# Patient Record
Sex: Male | Born: 1965 | Race: White | Hispanic: No | Marital: Married | Smoking: Never smoker
Health system: Southern US, Community
[De-identification: ages and names within clinical notes are randomized; demographics above are authoritative.]

## PROBLEM LIST (undated history)

## (undated) DIAGNOSIS — J189 Pneumonia, unspecified organism: Secondary | ICD-10-CM

## (undated) DIAGNOSIS — R1310 Dysphagia, unspecified: Secondary | ICD-10-CM

## (undated) DIAGNOSIS — I447 Left bundle-branch block, unspecified: Secondary | ICD-10-CM

## (undated) DIAGNOSIS — Z8619 Personal history of other infectious and parasitic diseases: Secondary | ICD-10-CM

## (undated) DIAGNOSIS — K219 Gastro-esophageal reflux disease without esophagitis: Secondary | ICD-10-CM

## (undated) DIAGNOSIS — G7111 Myotonic muscular dystrophy: Secondary | ICD-10-CM

## (undated) DIAGNOSIS — Z8701 Personal history of pneumonia (recurrent): Secondary | ICD-10-CM

## (undated) DIAGNOSIS — I429 Cardiomyopathy, unspecified: Secondary | ICD-10-CM

## (undated) DIAGNOSIS — J40 Bronchitis, not specified as acute or chronic: Secondary | ICD-10-CM

## (undated) DIAGNOSIS — G4730 Sleep apnea, unspecified: Secondary | ICD-10-CM

## (undated) HISTORY — DX: Gastro-esophageal reflux disease without esophagitis: K21.9

## (undated) HISTORY — DX: Left bundle-branch block, unspecified: I44.7

## (undated) HISTORY — DX: Myotonic muscular dystrophy: G71.11

## (undated) HISTORY — DX: Personal history of other infectious and parasitic diseases: Z86.19

## (undated) HISTORY — DX: Personal history of pneumonia (recurrent): Z87.01

## (undated) HISTORY — DX: Bronchitis, not specified as acute or chronic: J40

## (undated) HISTORY — PX: PACEMAKER IMPLANT: EP1218

## (undated) HISTORY — PX: CARDIAC DEFIBRILLATOR PLACEMENT: SHX171

## (undated) HISTORY — DX: Cardiomyopathy, unspecified: I42.9

---

## 2008-04-20 ENCOUNTER — Inpatient Hospital Stay: Payer: Self-pay | Admitting: *Deleted

## 2008-04-29 ENCOUNTER — Encounter: Payer: Self-pay | Admitting: Neurology

## 2010-06-27 ENCOUNTER — Inpatient Hospital Stay: Payer: Self-pay | Admitting: Internal Medicine

## 2010-09-26 HISTORY — PX: US ECHOCARDIOGRAPHY: HXRAD669

## 2010-11-03 ENCOUNTER — Encounter: Payer: Self-pay | Admitting: Cardiology

## 2010-11-27 ENCOUNTER — Encounter: Payer: Self-pay | Admitting: Cardiology

## 2010-12-24 DIAGNOSIS — J189 Pneumonia, unspecified organism: Secondary | ICD-10-CM | POA: Insufficient documentation

## 2010-12-27 ENCOUNTER — Encounter: Payer: Self-pay | Admitting: Cardiology

## 2011-03-29 DIAGNOSIS — Z8701 Personal history of pneumonia (recurrent): Secondary | ICD-10-CM

## 2011-03-29 HISTORY — DX: Personal history of pneumonia (recurrent): Z87.01

## 2012-12-07 ENCOUNTER — Emergency Department: Payer: Self-pay | Admitting: Emergency Medicine

## 2013-05-14 ENCOUNTER — Ambulatory Visit: Payer: Self-pay | Admitting: Family Medicine

## 2013-05-31 ENCOUNTER — Ambulatory Visit (INDEPENDENT_AMBULATORY_CARE_PROVIDER_SITE_OTHER): Payer: Medicare Other | Admitting: Family Medicine

## 2013-05-31 ENCOUNTER — Encounter: Payer: Self-pay | Admitting: Family Medicine

## 2013-05-31 VITALS — BP 112/76 | HR 76 | Temp 98.1°F | Ht 71.0 in | Wt 200.2 lb

## 2013-05-31 DIAGNOSIS — I429 Cardiomyopathy, unspecified: Secondary | ICD-10-CM | POA: Insufficient documentation

## 2013-05-31 DIAGNOSIS — G7111 Myotonic muscular dystrophy: Secondary | ICD-10-CM | POA: Insufficient documentation

## 2013-05-31 DIAGNOSIS — K219 Gastro-esophageal reflux disease without esophagitis: Secondary | ICD-10-CM | POA: Insufficient documentation

## 2013-05-31 DIAGNOSIS — E291 Testicular hypofunction: Secondary | ICD-10-CM

## 2013-05-31 DIAGNOSIS — N529 Male erectile dysfunction, unspecified: Secondary | ICD-10-CM

## 2013-05-31 DIAGNOSIS — I428 Other cardiomyopathies: Secondary | ICD-10-CM

## 2013-05-31 DIAGNOSIS — I447 Left bundle-branch block, unspecified: Secondary | ICD-10-CM | POA: Insufficient documentation

## 2013-05-31 NOTE — Progress Notes (Signed)
Pre visit review using our clinic review tool, if applicable. No additional management support is needed unless otherwise documented below in the visit note. 

## 2013-05-31 NOTE — Patient Instructions (Addendum)
Sign release for records from Dr. Debbora Dus at [place], and Dr. Ileene Patrick at [facility]. Good to meet you today, call us with questions. Return as needed or in 4-6 months for a wellness exam, prior fasting for blood work.

## 2013-05-31 NOTE — Progress Notes (Addendum)
BP 112/76  Pulse 76  Temp(Src) 98.1 F (36.7 C) (Oral)  Ht 5\' 11"  (1.803 m)  Wt 200 lb 4 oz (90.833 kg)  BMI 27.94 kg/m2  SpO2 98%   CC: new pt to establish  Subjective:    Patient ID: James [name], male    DOB: 01/27/1966, 48 y.o.   MRN: [id number]  HPI: James [name] is a 48 y.o. male presenting on 05/31/2013 with Establish Care   Prior saw Dr. Osker Mason at [facility].  Myotonic muscular dystrophy - dx 2010 at age 33yo.  Sees Neurology Dr. Paulla Dolly at [facility], now sees neuroscience center Dr. Ileene Patrick at [facility].  Last seen January 2014.  Sees Dr. Rich Fuchs at [place] cardiologist.  Brings EKG showing sinus bradycardia with 1st degree AV block, LBBB and LAD.  Brings echo dated 09/2010 showing mild LV dysfuction with mild LVH, mild RV systolic dysfunction.  Some erectile dysfunction - wonders about viagra trial.  Has been told has low testosterone.  Main trouble maintaining erection.  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  Allergies and medications reviewed and updated. No current outpatient prescriptions on file prior to visit.   No current facility-administered medications on file prior to visit.    Review of Systems Per HPI unless specifically indicated above    Objective:    BP 112/76  Pulse 76  Temp(Src) 98.1 F (36.7 C) (Oral)  Ht 5\' 11"  (1.803 m)  Wt 200 lb 4 oz (90.833 kg)  BMI 27.94 kg/m2  SpO2 98%  Physical Exam  Nursing note and vitals reviewed. Constitutional: He is oriented to person, place, and time. He appears well-developed and well-nourished. No distress.  HENT:  Head: Normocephalic and atraumatic.  Nose: Nose  normal.  Mouth/Throat: Uvula is midline, oropharynx is clear and moist and mucous membranes are normal. No oropharyngeal exudate, posterior oropharyngeal edema or posterior oropharyngeal erythema.  Eyes: Conjunctivae and EOM are normal. Pupils are equal, round, and reactive to light. No scleral icterus.  Neck: Normal range of motion. Neck supple.  Cardiovascular: Normal rate, regular rhythm, normal heart sounds and intact distal pulses.   No murmur heard. Pulses:      Radial pulses are 2+ on the right side, and 2+ on the left side.  Pulmonary/Chest: Effort normal and breath sounds normal. No respiratory distress. He has no wheezes. He has no rales.  Musculoskeletal: Normal range of motion. He exhibits no edema.  Lymphadenopathy:    He has no cervical adenopathy.  Neurological: He is alert and oriented to person, place, and time.  5/5 strength BLE.  5/5 strength proximal UE, decreased grip strength noted. Decreased facial tone  Skin: Skin is warm and dry. No rash noted.  Psychiatric: He has a normal mood and affect. His behavior is normal. Judgment and thought content normal.   No results found for this or any previous visit.    Assessment & Plan:   Problem List Items Addressed This Visit   Cardiomyopathy     I have scanned latest EKG and echo into chart.  Pt is followed by [facility].    Erectile dysfunction     Await records from cards then consider PDE5 inhibitor trial.  GERD (gastroesophageal reflux disease)     Chronic, stable with prn antihistamine    Relevant Medications      docusate sodium (COLACE) 50 MG capsule      ranitidine (ZANTAC) 150 MG capsule   Hypogonadism male     Possibly myotonic dystrophy related.  Will await records from prior PCP and consider checking testosterone.    LBBB (left bundle branch block)   Myotonic dystrophy, type 1 - Primary     I have requested records from neurologist at [facility]. Seems relatively stable - will need to monitor for  cardiac conduction abnormalities, cataracts, testicular failure, hypogammaglobulinemia, and insulin resistance as well as foot drop and sleep disorders. Pt on disability for muscular dystrophy.        Follow up plan: Return in about 6 months (around 12/01/2013), or as needed, for wellness exam..

## 2013-06-01 ENCOUNTER — Encounter: Payer: Self-pay | Admitting: Family Medicine

## 2013-06-01 DIAGNOSIS — N529 Male erectile dysfunction, unspecified: Secondary | ICD-10-CM | POA: Insufficient documentation

## 2013-06-01 DIAGNOSIS — E291 Testicular hypofunction: Secondary | ICD-10-CM | POA: Insufficient documentation

## 2013-06-01 NOTE — Assessment & Plan Note (Signed)
Possibly myotonic dystrophy related.  Will await records from prior PCP and consider checking testosterone.

## 2013-06-01 NOTE — Assessment & Plan Note (Signed)
I have scanned latest EKG and echo into chart.  Pt is followed by [facility].

## 2013-06-01 NOTE — Assessment & Plan Note (Signed)
Await records from cards then consider PDE5 inhibitor trial.

## 2013-06-01 NOTE — Assessment & Plan Note (Signed)
Chronic, stable with prn antihistamine

## 2013-06-01 NOTE — Assessment & Plan Note (Addendum)
I have requested records from neurologist at [facility]. Seems relatively stable - will need to monitor for cardiac conduction abnormalities, cataracts, testicular failure, hypogammaglobulinemia, and insulin resistance as well as foot drop and sleep disorders. Pt on disability for muscular dystrophy.

## 2013-06-22 ENCOUNTER — Encounter: Payer: Self-pay | Admitting: Family Medicine

## 2013-08-02 DIAGNOSIS — E291 Testicular hypofunction: Secondary | ICD-10-CM | POA: Insufficient documentation

## 2013-08-08 DIAGNOSIS — N500 Atrophy of testis: Secondary | ICD-10-CM | POA: Insufficient documentation

## 2013-10-01 ENCOUNTER — Emergency Department: Payer: Self-pay | Admitting: Emergency Medicine

## 2013-10-01 LAB — COMPREHENSIVE METABOLIC PANEL
ALBUMIN: 3.6 g/dL (ref 3.4–5.0)
AST: 29 U/L (ref 15–37)
Alkaline Phosphatase: 73 U/L
Anion Gap: 5 — ABNORMAL LOW (ref 7–16)
BILIRUBIN TOTAL: 1.6 mg/dL — AB (ref 0.2–1.0)
BUN: 10 mg/dL (ref 7–18)
CALCIUM: 8.9 mg/dL (ref 8.5–10.1)
CREATININE: 0.9 mg/dL (ref 0.60–1.30)
Chloride: 108 mmol/L — ABNORMAL HIGH (ref 98–107)
Co2: 29 mmol/L (ref 21–32)
EGFR (African American): >60
GLUCOSE: 100 mg/dL — AB (ref 65–99)
Osmolality: 282 (ref 275–301)
Potassium: 3.7 mmol/L (ref 3.5–5.1)
SGPT (ALT): 57 U/L (ref 12–78)
Sodium: 142 mmol/L (ref 136–145)
Total Protein: 7.3 g/dL (ref 6.4–8.2)

## 2013-10-01 LAB — CBC
HCT: 48.5 % (ref 40.0–52.0)
HGB: 15.8 g/dL (ref 13.0–18.0)
MCH: 30.8 pg (ref 26.0–34.0)
MCHC: 32.6 g/dL (ref 32.0–36.0)
MCV: 94 fL (ref 80–100)
PLATELETS: 181 10*3/uL (ref 150–440)
RBC: 5.14 10*6/uL (ref 4.40–5.90)
RDW: 14.6 % — AB (ref 11.5–14.5)
WBC: 7.7 10*3/uL (ref 3.8–10.6)

## 2013-10-01 LAB — URINALYSIS, COMPLETE
BACTERIA: NONE SEEN
Bilirubin,UR: NEGATIVE
Blood: NEGATIVE
GLUCOSE, UR: NEGATIVE mg/dL (ref 0–75)
Ketone: NEGATIVE
LEUKOCYTE ESTERASE: NEGATIVE
Nitrite: NEGATIVE
PROTEIN: NEGATIVE
Ph: 7 (ref 4.5–8.0)
RBC, UR: NONE SEEN /HPF (ref 0–5)
SPECIFIC GRAVITY: 1.003 (ref 1.003–1.030)
Squamous Epithelial: NONE SEEN
WBC UR: 1 /HPF (ref 0–5)

## 2013-10-01 LAB — TROPONIN I: Troponin-I: <0.02 ng/mL

## 2013-10-06 ENCOUNTER — Emergency Department: Payer: Self-pay | Admitting: Emergency Medicine

## 2013-10-06 LAB — BASIC METABOLIC PANEL
ANION GAP: 5 — AB (ref 7–16)
BUN: 7 mg/dL (ref 7–18)
CALCIUM: 8.8 mg/dL (ref 8.5–10.1)
CHLORIDE: 108 mmol/L — AB (ref 98–107)
Co2: 31 mmol/L (ref 21–32)
Creatinine: 1.15 mg/dL (ref 0.60–1.30)
Glucose: 107 mg/dL — ABNORMAL HIGH (ref 65–99)
Osmolality: 285 (ref 275–301)
Potassium: 4.4 mmol/L (ref 3.5–5.1)
Sodium: 144 mmol/L (ref 136–145)

## 2013-10-06 LAB — CBC
HCT: 45.2 % (ref 40.0–52.0)
HGB: 15 g/dL (ref 13.0–18.0)
MCH: 31.6 pg (ref 26.0–34.0)
MCHC: 33.1 g/dL (ref 32.0–36.0)
MCV: 95 fL (ref 80–100)
PLATELETS: 150 10*3/uL (ref 150–440)
RBC: 4.74 10*6/uL (ref 4.40–5.90)
RDW: 14.5 % (ref 11.5–14.5)
WBC: 5.5 10*3/uL (ref 3.8–10.6)

## 2013-10-06 LAB — PRO B NATRIURETIC PEPTIDE: B-Type Natriuretic Peptide: 48 pg/mL (ref 0–125)

## 2013-10-06 LAB — TROPONIN I: Troponin-I: <0.02 ng/mL

## 2013-10-07 ENCOUNTER — Inpatient Hospital Stay: Payer: Self-pay | Admitting: Internal Medicine

## 2013-10-07 LAB — COMPREHENSIVE METABOLIC PANEL
ALBUMIN: 3.3 g/dL — AB (ref 3.4–5.0)
ALT: 46 U/L (ref 12–78)
AST: 52 U/L — AB (ref 15–37)
Alkaline Phosphatase: 77 U/L
Anion Gap: 4 — ABNORMAL LOW (ref 7–16)
BUN: 7 mg/dL (ref 7–18)
Bilirubin,Total: 1.3 mg/dL — ABNORMAL HIGH (ref 0.2–1.0)
CALCIUM: 8.4 mg/dL — AB (ref 8.5–10.1)
CREATININE: 0.99 mg/dL (ref 0.60–1.30)
Chloride: 110 mmol/L — ABNORMAL HIGH (ref 98–107)
Co2: 28 mmol/L (ref 21–32)
EGFR (Non-African Amer.): >60
GLUCOSE: 96 mg/dL (ref 65–99)
Osmolality: 281 (ref 275–301)
POTASSIUM: 4.2 mmol/L (ref 3.5–5.1)
SODIUM: 142 mmol/L (ref 136–145)
TOTAL PROTEIN: 7.5 g/dL (ref 6.4–8.2)

## 2013-10-07 LAB — CBC WITH DIFFERENTIAL/PLATELET
Basophil #: 0 10*3/uL (ref 0.0–0.1)
Basophil %: 0.5 %
EOS ABS: 0.2 10*3/uL (ref 0.0–0.7)
Eosinophil %: 2.7 %
HCT: 45.5 % (ref 40.0–52.0)
HGB: 15.3 g/dL (ref 13.0–18.0)
LYMPHS ABS: 1.2 10*3/uL (ref 1.0–3.6)
Lymphocyte %: 17.1 %
MCH: 32.1 pg (ref 26.0–34.0)
MCHC: 33.5 g/dL (ref 32.0–36.0)
MCV: 96 fL (ref 80–100)
MONO ABS: 0.7 x10 3/mm (ref 0.2–1.0)
Monocyte %: 9.9 %
NEUTROS ABS: 4.7 10*3/uL (ref 1.4–6.5)
Neutrophil %: 69.8 %
PLATELETS: 154 10*3/uL (ref 150–440)
RBC: 4.75 10*6/uL (ref 4.40–5.90)
RDW: 14.3 % (ref 11.5–14.5)
WBC: 6.7 10*3/uL (ref 3.8–10.6)

## 2013-10-07 LAB — TROPONIN I
Troponin-I: <0.02 ng/mL
Troponin-I: <0.02 ng/mL

## 2013-10-08 LAB — SODIUM: SODIUM: 144 mmol/L (ref 136–145)

## 2013-10-08 LAB — CBC WITH DIFFERENTIAL/PLATELET
BASOS ABS: 0 10*3/uL (ref 0.0–0.1)
Basophil %: 0.6 %
Eosinophil #: 0.2 10*3/uL (ref 0.0–0.7)
Eosinophil %: 4.4 %
HCT: 41.1 % (ref 40.0–52.0)
HGB: 13.9 g/dL (ref 13.0–18.0)
Lymphocyte #: 1.4 10*3/uL (ref 1.0–3.6)
Lymphocyte %: 25.4 %
MCH: 32.1 pg (ref 26.0–34.0)
MCHC: 33.9 g/dL (ref 32.0–36.0)
MCV: 95 fL (ref 80–100)
MONO ABS: 0.5 x10 3/mm (ref 0.2–1.0)
Monocyte %: 8.7 %
NEUTROS ABS: 3.4 10*3/uL (ref 1.4–6.5)
Neutrophil %: 60.9 %
PLATELETS: 136 10*3/uL — AB (ref 150–440)
RBC: 4.33 10*6/uL — ABNORMAL LOW (ref 4.40–5.90)
RDW: 14.1 % (ref 11.5–14.5)
WBC: 5.6 10*3/uL (ref 3.8–10.6)

## 2013-10-08 LAB — BASIC METABOLIC PANEL
Anion Gap: 5 — ABNORMAL LOW (ref 7–16)
BUN: 7 mg/dL (ref 7–18)
CHLORIDE: 111 mmol/L — AB (ref 98–107)
Calcium, Total: 8.3 mg/dL — ABNORMAL LOW (ref 8.5–10.1)
Co2: 30 mmol/L (ref 21–32)
Creatinine: 1.09 mg/dL (ref 0.60–1.30)
EGFR (African American): >60
EGFR (Non-African Amer.): >60
Glucose: 93 mg/dL (ref 65–99)
OSMOLALITY: 288 (ref 275–301)
POTASSIUM: 3.4 mmol/L — AB (ref 3.5–5.1)
Sodium: 146 mmol/L — ABNORMAL HIGH (ref 136–145)

## 2013-10-09 LAB — BASIC METABOLIC PANEL
ANION GAP: 5 — AB (ref 7–16)
BUN: 7 mg/dL (ref 7–18)
CHLORIDE: 110 mmol/L — AB (ref 98–107)
CO2: 30 mmol/L (ref 21–32)
CREATININE: 1.06 mg/dL (ref 0.60–1.30)
Calcium, Total: 8.1 mg/dL — ABNORMAL LOW (ref 8.5–10.1)
EGFR (African American): >60
EGFR (Non-African Amer.): >60
Glucose: 120 mg/dL — ABNORMAL HIGH (ref 65–99)
Osmolality: 288 (ref 275–301)
Potassium: 3.8 mmol/L (ref 3.5–5.1)
SODIUM: 145 mmol/L (ref 136–145)

## 2013-10-09 LAB — MAGNESIUM: MAGNESIUM: 2.2 mg/dL

## 2013-10-12 LAB — CULTURE, BLOOD (SINGLE)

## 2013-10-13 LAB — EXPECTORATED SPUTUM ASSESSMENT W REFEX TO RESP CULTURE

## 2013-11-05 ENCOUNTER — Encounter: Payer: Medicare Other | Admitting: Family Medicine

## 2014-01-14 ENCOUNTER — Encounter: Payer: Self-pay | Admitting: Family Medicine

## 2014-01-23 ENCOUNTER — Emergency Department: Payer: Self-pay | Admitting: Emergency Medicine

## 2014-01-23 LAB — URINALYSIS, COMPLETE
BACTERIA: NONE SEEN
Bilirubin,UR: NEGATIVE
Blood: NEGATIVE
Glucose,UR: NEGATIVE mg/dL (ref 0–75)
KETONE: NEGATIVE
Leukocyte Esterase: NEGATIVE
Nitrite: NEGATIVE
Ph: 7 (ref 4.5–8.0)
Protein: NEGATIVE
RBC, UR: NONE SEEN /HPF (ref 0–5)
SPECIFIC GRAVITY: 1.006 (ref 1.003–1.030)
SQUAMOUS EPITHELIAL: NONE SEEN

## 2014-01-23 LAB — CBC
HCT: 40.5 % (ref 40.0–52.0)
HGB: 13.4 g/dL (ref 13.0–18.0)
MCH: 32 pg (ref 26.0–34.0)
MCHC: 33.2 g/dL (ref 32.0–36.0)
MCV: 97 fL (ref 80–100)
Platelet: 140 10*3/uL — ABNORMAL LOW (ref 150–440)
RBC: 4.19 10*6/uL — AB (ref 4.40–5.90)
RDW: 14.4 % (ref 11.5–14.5)
WBC: 4.9 10*3/uL (ref 3.8–10.6)

## 2014-01-23 LAB — CK TOTAL AND CKMB (NOT AT ARMC)
CK, TOTAL: 160 U/L
CK-MB: 2.1 ng/mL (ref 0.5–3.6)

## 2014-01-23 LAB — COMPREHENSIVE METABOLIC PANEL
ALK PHOS: 81 U/L
ALT: 63 U/L
ANION GAP: 3 — AB (ref 7–16)
Albumin: 2.9 g/dL — ABNORMAL LOW (ref 3.4–5.0)
BILIRUBIN TOTAL: 0.6 mg/dL (ref 0.2–1.0)
BUN: 7 mg/dL (ref 7–18)
CALCIUM: 7.7 mg/dL — AB (ref 8.5–10.1)
CHLORIDE: 111 mmol/L — AB (ref 98–107)
Co2: 31 mmol/L (ref 21–32)
Creatinine: 0.88 mg/dL (ref 0.60–1.30)
GLUCOSE: 99 mg/dL (ref 65–99)
OSMOLALITY: 287 (ref 275–301)
Potassium: 3.4 mmol/L — ABNORMAL LOW (ref 3.5–5.1)
SGOT(AST): 36 U/L (ref 15–37)
Sodium: 145 mmol/L (ref 136–145)
TOTAL PROTEIN: 6.1 g/dL — AB (ref 6.4–8.2)

## 2014-01-23 LAB — TROPONIN I: Troponin-I: <0.02 ng/mL

## 2014-04-15 ENCOUNTER — Emergency Department: Payer: Self-pay | Admitting: Emergency Medicine

## 2014-04-15 DIAGNOSIS — J9811 Atelectasis: Secondary | ICD-10-CM | POA: Diagnosis not present

## 2014-04-15 DIAGNOSIS — R197 Diarrhea, unspecified: Secondary | ICD-10-CM | POA: Diagnosis not present

## 2014-04-15 DIAGNOSIS — R112 Nausea with vomiting, unspecified: Secondary | ICD-10-CM | POA: Diagnosis not present

## 2014-04-15 DIAGNOSIS — R1110 Vomiting, unspecified: Secondary | ICD-10-CM | POA: Diagnosis not present

## 2014-04-15 LAB — COMPREHENSIVE METABOLIC PANEL
ALK PHOS: 87 U/L
ALT: 52 U/L
Albumin: 3.7 g/dL (ref 3.4–5.0)
Anion Gap: 7 (ref 7–16)
BILIRUBIN TOTAL: 2.7 mg/dL — AB (ref 0.2–1.0)
BUN: 11 mg/dL (ref 7–18)
CHLORIDE: 104 mmol/L (ref 98–107)
CO2: 30 mmol/L (ref 21–32)
CREATININE: 1.22 mg/dL (ref 0.60–1.30)
Calcium, Total: 8.9 mg/dL (ref 8.5–10.1)
EGFR (African American): >60
EGFR (Non-African Amer.): >60
Glucose: 128 mg/dL — ABNORMAL HIGH (ref 65–99)
OSMOLALITY: 282 (ref 275–301)
Potassium: 4.1 mmol/L (ref 3.5–5.1)
SGOT(AST): 23 U/L (ref 15–37)
Sodium: 141 mmol/L (ref 136–145)
Total Protein: 7.7 g/dL (ref 6.4–8.2)

## 2014-04-15 LAB — CBC
HCT: 52.2 % — ABNORMAL HIGH (ref 40.0–52.0)
HGB: 17.5 g/dL (ref 13.0–18.0)
MCH: 31.9 pg (ref 26.0–34.0)
MCHC: 33.6 g/dL (ref 32.0–36.0)
MCV: 95 fL (ref 80–100)
PLATELETS: 194 10*3/uL (ref 150–440)
RBC: 5.5 10*6/uL (ref 4.40–5.90)
RDW: 14.4 % (ref 11.5–14.5)
WBC: 6.7 10*3/uL (ref 3.8–10.6)

## 2014-04-15 LAB — LIPASE, BLOOD: Lipase: 113 U/L (ref 73–393)

## 2014-04-28 DIAGNOSIS — I429 Cardiomyopathy, unspecified: Secondary | ICD-10-CM | POA: Diagnosis not present

## 2014-04-28 DIAGNOSIS — I447 Left bundle-branch block, unspecified: Secondary | ICD-10-CM | POA: Diagnosis not present

## 2014-04-28 DIAGNOSIS — G7111 Myotonic muscular dystrophy: Secondary | ICD-10-CM | POA: Diagnosis not present

## 2014-05-08 DIAGNOSIS — I429 Cardiomyopathy, unspecified: Secondary | ICD-10-CM | POA: Diagnosis not present

## 2014-05-08 DIAGNOSIS — G7111 Myotonic muscular dystrophy: Secondary | ICD-10-CM | POA: Diagnosis not present

## 2014-05-08 DIAGNOSIS — I447 Left bundle-branch block, unspecified: Secondary | ICD-10-CM | POA: Diagnosis not present

## 2014-05-16 DIAGNOSIS — Z0000 Encounter for general adult medical examination without abnormal findings: Secondary | ICD-10-CM | POA: Diagnosis not present

## 2014-07-19 NOTE — Consult Note (Signed)
PATIENT NAME:  James [name], James [name] MR#:  [id number] DATE OF BIRTH:  12-04-1965  DATE OF CONSULTATION:  10/10/2013  REFERRING PHYSICIAN:  Theodoro Grist, MD CONSULTING PHYSICIAN:  Herbon E. Raul Del, MD  REASON FOR CONSULTATION: Shortness of breath, pneumonia.   HISTORY OF PRESENT ILLNESS: James [name] is a 49 year old gentleman who has muscular dystrophy involving primarily his shoulders and upper chest. He comes in here on 07/13 with a cough, somewhat productive, but no associated blood. On work-up his initial chest x-ray showed patchy left basilar airspace disease suggestive of pneumonia. His white count on admission was 5.5. Because of shortness of breath and vague chest pain he also had a chest CT scan that showed no demonstrable pulmonary embolism. There was airspace consolidation in the left lower lobe and multifocal ground-glass type opacity also was seen. Hence, raising the possibility of pneumonia. There was no adenopathy or pleural effusion or aneurysm noted. The gallbladder was contracted and there were gallstones noted. The patient was empirically started on antibiotics. He is presently on azithromycin 500 mg daily. He is also on Rocephin 1 gram IV daily. Repeat chest x-ray 24 hours later showed low volumes as well as continued airspace opacity. At the time that I saw him he was having a cough, said he was feeling better. His father said he was much better since he had been in the hospital. He was not having any fevers. On 2 liters his sats are 96%. He was placed on room air and his subsequent sats have remained above 92%. There is no significant chest pain. No calf pain. No progressive leg edema. He has not been in any contact with anyone with TB or any active pulmonary infection. He has not had any recent travel. No history of aspiration. He did mention though he had been to the Emergency Room at least twice. He was discharged recently on Levaquin. Lastly, he does not seem as if he is well enough to  go home as yet. He did mention he was having problems mobilizing his secretions at times.   PAST MEDICAL HISTORY:  1.  Multiple pneumonias in the past.   2.  Muscular dystrophy.   PAST SURGICAL HISTORY: None.   ALLERGIES: Intolerance to CODEINE, causes nausea and vomiting.   SOCIAL HISTORY: He does not drink or smoke.   FAMILY HISTORY: Notable for hypertension.  REVIEW OF SYSTEMS: As per above. No headache, dizziness, passing out spells, choking spells. No sore throat, change in voice. No decrease in hearing. Does wear glasses. Chest as per above. No wheezing. Does have cough, minimal sputum production. No hemoptysis. No pleurisy nor is he having any orthopnea or paroxysmal nocturnal dyspnea. No nausea, vomiting, diarrhea, blood in stool, dysuria, flank pain, rashes, lymph nodes, TIA, seizures, passing out spells, or suicidal ideation today.   PHYSICAL EXAMINATION: VITAL SIGNS: Temperature 98.4, pulse 82, respiratory 20, blood pressure 121/76, and sats 91% on room air.  GENERAL: Large framed gentleman sitting in the chair with somewhat flat affect, glasses on, coughed occasionally while here, father at his side.  HEENT: Normocephalic, atraumatic. Extraocular muscles are intact. No thrush. NECK: Somewhat short and thick. No JVD. No thyromegaly.  LUNGS: Bilateral breath sounds with crackles at the bases, more so on the left.  HEART: Regular rate and rhythm. No obvious murmur or gallops. No peripheral signs of spontaneous bacterial endocarditis.  ABDOMEN: Soft, nontender, somewhat obese.  EXTREMITIES: No significant edema, cyanosis, or Homans sign.  SKIN: Fair turgor. No rashes. No unhealing  ulcers, telangiectasia, sclerodactyly, or bruising.  LYMPH: No nodes in neck or supraclavicular area.  NEUROLOGIC: Did not ambulate, but able to raise extremities against gravity.  PSYCH: Somewhat flat affect but appropriate, reasonable insight.  DIAGNOSTIC DATA: ABG on 07/15 showed a pH of 7.31, pCO2  51, and pO2 65 on FiO2 of 28. Lactic acid 1.2, which was normal.   Two-D echocardiogram on 10/10/2013 showed an ejection fraction of 70% to 75%. No significant right ventricular systolic pressure. No significant valvular disease.   Glucose 120, BUN 7, creatinine 1.06, sodium 145, potassium 3.8, chloride 110. Magnesium 2.2. WBC 5.6, hemoglobin 13.9, hematocrit 41.1, platelets 136,000, down from 150,000 on admission.    IMPRESSION: This is a pleasant 49 year old gentleman with muscular dystrophy having reasonably good cough. Radiographically and from his history it sounds like he probably does have left lower lung pneumonia on appropriate antibiotic coverage. Hence, my vote would be to continue coverage as he is taking. On discharge can probably discharge him on Ceftin 500 mg b.i.d. as well as azithromycin 500 mg daily, for a total of 10 days. The azithromycin we probably can stop after 7 days. I would also recommend that he get Mucinex and flutter valve, and if he is having problems with mobilizing secretions now as well as in the future he may probably benefit by being discharged on a vibratory vest, which we can get through [facility]. If possible sputum culture. Outpatient follow-up. We will reassess in the morning. Thank you for the referral. Yours truly.  ____________________________ James Munro Raul Del, MD hef:sb D: 10/11/2013 10:37:57 ET T: 10/11/2013 11:30:32 ET JOB#: [id number]  cc: Herbon E. Raul Del, MD, <Dictator> Erby Pian MD ELECTRONICALLY SIGNED 10/15/2013 16:50

## 2014-07-19 NOTE — Discharge Summary (Signed)
PATIENT NAME:  James [name], James [name] A MR#:  [id number] DATE OF BIRTH:  06/21/1965  DATE OF ADMISSION:  10/07/2013 DATE OF DISCHARGE:  10/11/2013  ADMITTING DIAGNOSIS: Pneumonia.   DISCHARGE DIAGNOSES:  1. Pneumonia.  2. Chest pain. 3. Cough. 4. Generalized weakness.  5. Hyponatremia with dehydration.  6. Hypokalemia. 7. History of muscular dystrophy.   DISCHARGE CONDITION: Stable.   DISCHARGE MEDICATIONS: The patient is to resume omeprazole 10 mg p.o. daily at bedtime, Naprosyn 250 mg p.o. twice daily, albuterol/ipratropium 6 times daily as needed via nebulizer, guaifenesin 600 mg p.o. twice daily, erythromycin 500 mg p.o. once daily for 1 more day, Tussionex 5 ml twice daily as needed. The patient is not to take amoxicillin or doxycycline. The patient is going to be discharged on Ceftin 500 mg p.o. twice daily as well as Zithromax 500 mg p.o. daily dose for a total of 10 days, so we are going to add 7 days of therapy which will be additional to whatever he received in the hospital..   HOME OXYGEN: None.   DIET: Regular, mechanical soft.   ACTIVITY LIMITATIONS: As tolerated. Referral to home health physical therapy was recommended.  Followup with Dr. Randel Books in 2 days after discharge. The patient will be discharged with nebulizer machine and pressure valve as well as vibration vest to be arranged for him upon discharge home.   CONSULTANTS:  1. Care management.  2. Social work.  3. Dr. Raul Del.   RADIOLOGIC STUDIES:  1. Chest x-ray, PA and lateral, continued patchy left basilar air-space disease, which could be due to atelectasis or pneumonia.  2. CT scan of chest with IV contrast to rule out pulmonary embolism as well as to evaluate lung disease showed no demonstrable pulmonary embolus. Air-space consolidation in the left lower lobe was noted. Multiple foci of ground-glass type opacities probably also representing infectious pneumonia, although this appearance may also be seen as a  consequence of allergic type phenomenon or atypical pulmonary edema. No adenopathy. There is a fatty liver according to radiology. 3. Repeated chest x-ray, portable, single view, on 10/07/2013, revealed low lung volumes, continued air-space opacities in the lung bases with associated volume loss potentially from atelectasis and/or pneumonia.   The patient is a 49 year old male with past medical history significant for history of muscular dystrophy, who presents to the hospital with complaints of chest pains, shortness of breath as well as cough. Please refer to Dr. Ebbie Ridge admission note on 10/09/2013. On arrival to the hospital, the patient's vital signs revealed a temperature of 98.8, pulse was 80, respiration rate was 18, blood pressure 102/66, saturation 95% on room air. Physical exam revealed crackles in all lung fields. The patient's lab data done on arrival to the hospital: BMP was unremarkable except for a glucose level of 107. Patient's liver enzymes done on 10/07/2013, revealed elevation of total bilirubin of 1.3, AST of 52. Cardiac enzymes done on 07/12/015 as well as 10/07/2013, were normal. CBC: White blood cell count was normal at 6.7 on 10/07/2013, hemoglobin was 15.3, platelet count 154,000. Absolute neutrophil count was normal. Blood cultures taken on 10/07/2013, did not show any growth. EKG revealed sinus rhythm at 79 beats per minute, 1st degree AV block, left axis deviation, left bundle-branch block. The patient was admitted to the hospital for further evaluation. He was started on Rocephin and Zithromax, and legionella urine antigen was taken, which was negative. Consultation with Dr. Raul Del was obtained. Dr. Raul Del saw the patient in consultation on 10/10/2013. Unfortunately, Dr.  Fleming's consult dictation was not available at the time of dictation; however, it appeared that Dr. Raul Del recommended to possibly discharge the patient home on Ceftin 500 mg p.o. twice daily, as well as  azithromycin 500 mg daily for a total of 10 days. The medicine, however, could be stopped after 7 days. He also recommended to get Mucinex as well as pressure valve, as he was having problems with mobilizing secretions, and according to Dr. Raul Del, radiology, he may benefit from discharge on vibratory vest. Recommended also outpatient follow up. The patient developedsome productive couph on current medications, IV Rocephin as well as Zithromax and it was felt that should be discharged on those medications. His sputum cultures we tried to obtain; however unfortunately, it was the unsuccessful and sputum cultures finally obtained on 10/10/2013, but they are still pending at the time of dictation. A few Gram-positive rods were noted, as well as many Gram-positive cocci in pairs.   On the day of discharge, the patient was comfortable,  not complaining of any significant discomfort. He is being discharged home with the above-mentioned medications and followup. On the day of discharge, the patient's temperature is 98.5, pulse was 79, respirations were 20, blood pressure 113/66, satura 94% to 98% on room air at rest.   TIME SPENT: 40 minutes on dictation.     ____________________________ Theodoro Grist, MD rv:lm D: 10/11/2013 20:39:41 ET T: 10/12/2013 05:03:47 ET JOB#: [id number]  cc: Theodoro Grist, MD, <Dictator> Milinda Pointer. Jacqualine Code, MD  Theodoro Grist MD ELECTRONICALLY SIGNED 10/26/2013 8:02

## 2014-07-19 NOTE — H&P (Signed)
PATIENT NAME:  James [name], James [name] MR#:  [id number] DATE OF BIRTH:  05/12/1965  DATE OF ADMISSION:  10/07/2013  PRIMARY CARE PHYSICIAN: Dr. Debbora Dus.   CHIEF COMPLAINT:  Cough, persistent pneumonia.   REASON FOR ADMISSION:  This is a 49 year old male with a history of muscular dystrophy. He was seen in our ER about 1 week ago. At that time he was complaining of productive cough, chest pain, fever and chills.  He had a chest x-ray, which was consistent with pneumonia.  He was sent home on Levaquin.  He finished his course of antibiotics, but still had persistent cough, more productive than previously with yellow sputum, low-grade fever so he came to the ER yesterday and was sent home with some antibiotics and then came back again today for nausea and vomiting, cough, fever and weakness. He denies any dyspnea on exertion, PND, orthopnea, lower extremity edema.  In the ER yesterday he had a CT which was consistent with left lower lobe pneumonia.   REVIEW OF SYSTEMS:  CONSTITUTIONAL: Positive fatigue, fever, weakness.  EYES:  No blurred, double vision or glaucoma.   ENT:  No ear pain, hearing loss, snoring, postnasal drip.  RESPIRATORY: Positive cough. No wheezing, hemoptysis. Positive dyspnea.  CARDIOVASCULAR:  Positive chest pain associated with his cough. No orthopnea, edema, arrhythmia, dyspnea on exertion, palpitations, syncope.  GASTROINTESTINAL: Positive nausea and vomiting. No diarrhea, abdominal pain, melena, or ulcers, abdominal pain.  GENITOURINARY:  No dysuria, hematuria.  ENDOCRINE: No polyuria or polydipsia.  HEME/LYMPH:  No anemia.  MUSCULOSKELETAL: He had muscular dystrophy.  No cramps or gout. SKIN: Without rashes or lesions. NEUROLOGIC:  No CVA, TIA, seizures. PSYCHIATRIC:  No history of anxiety, depression.  PAST MEDICAL HISTORY:   Muscular dystrophy.  MEDICATIONS: None.   ALLERGIES: CODEINE CAUSES NAUSEA, VOMITING.   SOCIAL HISTORY: No tobacco, alcohol or drug use.    FAMILY HISTORY: Positive for hypertension.   PAST SURGICAL HISTORY: None.   PHYSICAL EXAMINATION:  Temperature 98.8, pulse is 80, respirations 18, blood pressure 102/66, 95% on room air.  GENERAL: The patient is alert, oriented, not in acute distress.  HEENT: Head is atraumatic. Pupils are round and reactive.  Sclerae anicteric.  Mucous membranes are moist. Oropharynx is clear.  NECK: Supple without JVD, carotid bruit, enlarged thyroid.   CARDIOVASCULAR: Regular rate and rhythm. No murmurs, gallops, rubs. PMI is not displaced. LUNGS: He has crackles at the left base without any rhonchi, or wheezing. Normal percussion, normal chest expansion.  ABDOMEN: Bowel sounds heard, non-tender, non-distended, no hepatosplenomegaly. SKIN:  Normal color. No rashes lesions are seen. NEUROLOGIC: Cranial nerves II-XII are grossly intact. No focal deficits.  BACK: No CVA or vertebral tenderness.   LABORATORY DATA: White blood cells 6.7, hemoglobin 15.3, hematocrit 45.5, platelets are 154,000, sodium 142, potassium 4.2, chloride 110, bicarbonate 28, BUN 7, creatinine 0.99, glucose 96, calcium 8.4, bilirubin 1.3, alkaline phosphatase 77, ALT 46, AST 52, total protein 7.5, albumin 3.3.   CT angiogram, which was performed 10/06/2013 showed no pulmonary emboli.  It did show airspace consolidation left lower lobe, multiple foci of ground-glass-type opacity probably also representing infectious pneumonia.   Chest x-ray today shows continued airspace opacities in the lung bases with associated volume loss, potentially from atelectasis and/or pneumonia.   EKG: Sinus rhythm with 1st degree AV block.   ASSESSMENT AND PLAN:  A 49 year old male who presents with cough, fever, chills, and weakness; found to have community-acquired pneumonia, failed outpatient treatment.  1.  Community-acquired pneumonia.  The patient failed outpatient treatment with Levaquin so now I have changed him to azithromycin and Rocephin. Blood  cultures have been ordered, sputum culture ordered as well as a urine Legionella. If he does not improve, would consider pulmonary consultation. He does not seem to have any symptoms of congestive heart failure. He was found to have hypoxia in the ER this morning, with oxygen saturation 87% on room air. We will follow his oxygen level, wean oxygen as tolerated.  2.  Chest pain. The patient was complaining of chest pain associated with cough. We will just cycle cardiac enzymes to be thorough and make sure he is not having symptoms of acute coronary syndrome.  3.  Muscular dystrophy. The patient does not take any medications.    TIME SPENT: Approximately 45 minutes.   ____________________________ Donell Beers. Benjie Karvonen, MD spm:lt D: 10/07/2013 14:10:43 ET T: 10/07/2013 15:26:34 ET JOB#: [id number]  cc: Magalie Almon P. Benjie Karvonen, MD, <Dictator> Donell Beers Malaiah Viramontes MD ELECTRONICALLY SIGNED 10/07/2013 18:40

## 2014-12-23 DIAGNOSIS — G7111 Myotonic muscular dystrophy: Secondary | ICD-10-CM | POA: Diagnosis not present

## 2014-12-23 DIAGNOSIS — I447 Left bundle-branch block, unspecified: Secondary | ICD-10-CM | POA: Diagnosis not present

## 2014-12-23 DIAGNOSIS — I429 Cardiomyopathy, unspecified: Secondary | ICD-10-CM | POA: Diagnosis not present

## 2015-01-16 DIAGNOSIS — F329 Major depressive disorder, single episode, unspecified: Secondary | ICD-10-CM | POA: Diagnosis not present

## 2015-01-16 DIAGNOSIS — G4719 Other hypersomnia: Secondary | ICD-10-CM | POA: Diagnosis not present

## 2015-01-16 DIAGNOSIS — G7111 Myotonic muscular dystrophy: Secondary | ICD-10-CM | POA: Diagnosis not present

## 2015-01-16 DIAGNOSIS — Z23 Encounter for immunization: Secondary | ICD-10-CM | POA: Diagnosis not present

## 2015-01-16 DIAGNOSIS — R1310 Dysphagia, unspecified: Secondary | ICD-10-CM | POA: Diagnosis not present

## 2015-01-16 DIAGNOSIS — R0683 Snoring: Secondary | ICD-10-CM | POA: Diagnosis not present

## 2015-03-26 ENCOUNTER — Encounter: Payer: Self-pay | Admitting: Family Medicine

## 2015-03-26 ENCOUNTER — Ambulatory Visit (INDEPENDENT_AMBULATORY_CARE_PROVIDER_SITE_OTHER): Payer: Commercial Managed Care - HMO | Admitting: Family Medicine

## 2015-03-26 VITALS — BP 110/66 | HR 78 | Temp 98.5°F | Ht 71.0 in | Wt 202.2 lb

## 2015-03-26 DIAGNOSIS — I429 Cardiomyopathy, unspecified: Secondary | ICD-10-CM

## 2015-03-26 DIAGNOSIS — G7111 Myotonic muscular dystrophy: Secondary | ICD-10-CM

## 2015-03-26 DIAGNOSIS — R7309 Other abnormal glucose: Secondary | ICD-10-CM

## 2015-03-26 DIAGNOSIS — E669 Obesity, unspecified: Secondary | ICD-10-CM | POA: Diagnosis not present

## 2015-03-26 DIAGNOSIS — Z13220 Encounter for screening for lipoid disorders: Secondary | ICD-10-CM

## 2015-03-26 DIAGNOSIS — N529 Male erectile dysfunction, unspecified: Secondary | ICD-10-CM

## 2015-03-26 DIAGNOSIS — E663 Overweight: Secondary | ICD-10-CM

## 2015-03-26 DIAGNOSIS — J029 Acute pharyngitis, unspecified: Secondary | ICD-10-CM | POA: Insufficient documentation

## 2015-03-26 DIAGNOSIS — K219 Gastro-esophageal reflux disease without esophagitis: Secondary | ICD-10-CM

## 2015-03-26 NOTE — Progress Notes (Signed)
Pre visit review using our clinic review tool, if applicable. No additional management support is needed unless otherwise documented below in the visit note. 

## 2015-03-26 NOTE — Assessment & Plan Note (Signed)
We'll request records from his prior PCP to determine if he needs repeat testosterone testing prior to consideration of medication for erectile dysfunction.

## 2015-03-26 NOTE — Assessment & Plan Note (Signed)
Patient reports this is stable. Is followed by neurology at [facility] and cardiology at [facility]. We'll continue to monitor. We'll request records from PCP to determine if any lab work needs to be completed.

## 2015-03-26 NOTE — Assessment & Plan Note (Signed)
Chronic issue. Stable on omeprazole daily. Benign abdominal exam.

## 2015-03-26 NOTE — Progress Notes (Signed)
Patient ID: RIYAD KEENA, male   DOB: Feb 22, 1966, 49 y.o.   MRN: [id number]  [name], MD Phone: [phone]  LEVOY GEISEN is a 49 y.o. male who presents today for as a new patient. Previously saw Dr. Danise Mina in 2015. Has been seeing Dr. Jacqualine Code since then.  Myotonic muscular dystrophy: Patient diagnosed in 2010. Has been followed by neurology at [facility]. Last seen earlier this year. Notes this manifests mostly as mild weakness in his hands bilaterally. He does note he gets tired if he walks a long way as well.  Cardiomyopathy/left bundle branch block: Patient notes history of these things. He is followed by cardiology at [facility]. He reports he is going to have an echo at his next visit. Notes they usually do one yearly. Last echo February 2016 revealed mild LV dysfunction with mild LVH, normal RV systolic function, no valvular regurg, and no valvular stenosis. Denies chest pain, palpitations, and shortness of breath.  Patient does note some erectile dysfunction. States he has been told his testosterone is low. Notes he has trouble maintaining an erection. He is able to ejaculate. States he was seen by a specialist for his low testosterone. They put him on Cialis. They did not replace his testosterone.  GERD: Patient notes history of this. Is on omeprazole. Notes this is beneficial. He elevates the head of his bed. This is manifested as sour taste and burning in his throat. No abdominal pain or blood in the stool. Does note a history of pneumonia in the past.  Sore throat: Patient notes this morning has had mild sore throat. Minimal nonproductive cough. No shortness of breath. No recent fevers. Notes he did have chills one night last week that improved quickly. No ear fullness. No nasal or sinus congestion. Notes his wife has been sick with similar symptoms.  PMH: nonsmoker.   ROS see history of present illness  Objective  Physical Exam Filed Vitals:   03/26/15 0854  BP: 110/66    Pulse: 78  Temp: 98.5 F (36.9 C)    Physical Exam  Constitutional: He is well-developed, well-nourished, and in no distress.  HENT:  Head: Normocephalic and atraumatic.  Right Ear: External ear normal.  Left Ear: External ear normal.  Mouth/Throat: No oropharyngeal exudate.  Normal TMs bilaterally  Eyes: Conjunctivae are normal. Pupils are equal, round, and reactive to light.  Neck: Neck supple.  Cardiovascular: Normal rate, regular rhythm and normal heart sounds.  Exam reveals no gallop and no friction rub.   No murmur heard. Pulmonary/Chest: Effort normal and breath sounds normal. No respiratory distress. He has no wheezes. He has no rales.  Genitourinary: Penis normal.  Testicle small, uniform in shape with no masses or tenderness, normal epididymis, no inguinal hernias  Musculoskeletal: He exhibits no edema.  Lymphadenopathy:    He has no cervical adenopathy.  Neurological: He is alert.  CN 2-12 intact, 4 out of 5 strength in bilateral grip 5/5 strength in bilateral biceps, triceps, quads, hamstrings, plantar and dorsiflexion, sensation to light touch intact in bilateral UE and LE, normal gait, 2+ patellar reflexes  Skin: Skin is warm and dry. He is not diaphoretic.  Psychiatric:  Flat affect, denies depression     Assessment/Plan: Please see individual problem list.  Cardiomyopathy Appears stable. Followed by [facility] cardiology.  Erectile dysfunction We'll request records from his prior PCP to determine if he needs repeat testosterone testing prior to consideration of medication for erectile dysfunction.  GERD (gastroesophageal reflux disease) Chronic issue.  Stable on omeprazole daily. Benign abdominal exam.  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 Is followed by neurology at [facility] and cardiology at [facility]. We'll continue to monitor. We'll request records from PCP to determine if any lab work needs to be completed.  Sore throat Patient mild sore  throat starting this morning. Benign exam. He is well-appearing. He does have a sick contact. Possibly viral versus allergies. Advised on salt gargles and warm liquid with honey for this. He'll continue to monitor for progression of symptoms. Given return precautions.    Orders Placed This Encounter  Procedures  . Lipid Profile    Standing Status: Future     Number of Occurrences:      Standing Expiration Date: 03/25/2016  . Comp Met (CMET)    Standing Status: Future     Number of Occurrences:      Standing Expiration Date: 03/25/2016  . HgB A1c    Standing Status: Future     Number of Occurrences:      Standing Expiration Date: 03/25/2016   we will request records to determine if he's had lab work recently. If he has not he will return for fasting lab work as listed above.  Dragon [employer] forces training and education officer was used during the dictation process of this note. If any phrases or words seem inappropriate it is likely secondary to the translation process being inefficient.  [name]

## 2015-03-26 NOTE — Patient Instructions (Signed)
Nice to see you. You should continue to monitor the sore throat. You can use salt water gargles and warm liquid with honey to help with this. Please continue your omeprazole for reflux. If you develop increasing cough, cough productive of blood, shortness of breath, chest pain, fevers, or any new or change in symptoms please seek medical attention.

## 2015-03-26 NOTE — Assessment & Plan Note (Signed)
Appears stable. Followed by [facility] cardiology.

## 2015-03-26 NOTE — Assessment & Plan Note (Signed)
Patient mild sore throat starting this morning. Benign exam. He is well-appearing. He does have a sick contact. Possibly viral versus allergies. Advised on salt gargles and warm liquid with honey for this. He'll continue to monitor for progression of symptoms. Given return precautions.

## 2015-03-30 ENCOUNTER — Inpatient Hospital Stay
Admission: EM | Admit: 2015-03-30 | Discharge: 2015-04-07 | DRG: 177 | Disposition: A | Discharge status: Home / Self Care | Payer: Commercial Managed Care - HMO | Attending: Internal Medicine | Admitting: Internal Medicine

## 2015-03-30 ENCOUNTER — Encounter: Payer: Self-pay | Admitting: Emergency Medicine

## 2015-03-30 ENCOUNTER — Emergency Department: Payer: Commercial Managed Care - HMO

## 2015-03-30 DIAGNOSIS — J449 Chronic obstructive pulmonary disease, unspecified: Secondary | ICD-10-CM | POA: Diagnosis not present

## 2015-03-30 DIAGNOSIS — R062 Wheezing: Secondary | ICD-10-CM | POA: Diagnosis not present

## 2015-03-30 DIAGNOSIS — F419 Anxiety disorder, unspecified: Secondary | ICD-10-CM | POA: Diagnosis present

## 2015-03-30 DIAGNOSIS — Z66 Do not resuscitate: Secondary | ICD-10-CM | POA: Diagnosis present

## 2015-03-30 DIAGNOSIS — R0902 Hypoxemia: Secondary | ICD-10-CM

## 2015-03-30 DIAGNOSIS — R05 Cough: Secondary | ICD-10-CM | POA: Diagnosis not present

## 2015-03-30 DIAGNOSIS — K219 Gastro-esophageal reflux disease without esophagitis: Secondary | ICD-10-CM | POA: Diagnosis present

## 2015-03-30 DIAGNOSIS — I429 Cardiomyopathy, unspecified: Secondary | ICD-10-CM | POA: Diagnosis not present

## 2015-03-30 DIAGNOSIS — Z79899 Other long term (current) drug therapy: Secondary | ICD-10-CM

## 2015-03-30 DIAGNOSIS — E876 Hypokalemia: Secondary | ICD-10-CM | POA: Diagnosis not present

## 2015-03-30 DIAGNOSIS — Z515 Encounter for palliative care: Secondary | ICD-10-CM | POA: Diagnosis present

## 2015-03-30 DIAGNOSIS — E872 Acidosis: Secondary | ICD-10-CM | POA: Diagnosis not present

## 2015-03-30 DIAGNOSIS — Z885 Allergy status to narcotic agent status: Secondary | ICD-10-CM | POA: Diagnosis not present

## 2015-03-30 DIAGNOSIS — J9690 Respiratory failure, unspecified, unspecified whether with hypoxia or hypercapnia: Secondary | ICD-10-CM

## 2015-03-30 DIAGNOSIS — G7111 Myotonic muscular dystrophy: Secondary | ICD-10-CM | POA: Diagnosis present

## 2015-03-30 DIAGNOSIS — J9601 Acute respiratory failure with hypoxia: Secondary | ICD-10-CM | POA: Diagnosis not present

## 2015-03-30 DIAGNOSIS — I447 Left bundle-branch block, unspecified: Secondary | ICD-10-CM | POA: Diagnosis not present

## 2015-03-30 DIAGNOSIS — J690 Pneumonitis due to inhalation of food and vomit: Principal | ICD-10-CM | POA: Diagnosis present

## 2015-03-30 DIAGNOSIS — R1310 Dysphagia, unspecified: Secondary | ICD-10-CM | POA: Insufficient documentation

## 2015-03-30 DIAGNOSIS — J9621 Acute and chronic respiratory failure with hypoxia: Secondary | ICD-10-CM | POA: Diagnosis not present

## 2015-03-30 DIAGNOSIS — J40 Bronchitis, not specified as acute or chronic: Secondary | ICD-10-CM | POA: Diagnosis not present

## 2015-03-30 DIAGNOSIS — J189 Pneumonia, unspecified organism: Secondary | ICD-10-CM | POA: Diagnosis not present

## 2015-03-30 DIAGNOSIS — R0689 Other abnormalities of breathing: Secondary | ICD-10-CM | POA: Diagnosis present

## 2015-03-30 DIAGNOSIS — R0602 Shortness of breath: Secondary | ICD-10-CM | POA: Diagnosis not present

## 2015-03-30 DIAGNOSIS — J9622 Acute and chronic respiratory failure with hypercapnia: Secondary | ICD-10-CM | POA: Diagnosis not present

## 2015-03-30 DIAGNOSIS — J129 Viral pneumonia, unspecified: Secondary | ICD-10-CM | POA: Diagnosis not present

## 2015-03-30 DIAGNOSIS — J9611 Chronic respiratory failure with hypoxia: Secondary | ICD-10-CM | POA: Diagnosis not present

## 2015-03-30 DIAGNOSIS — G710 Muscular dystrophy: Secondary | ICD-10-CM | POA: Diagnosis not present

## 2015-03-30 LAB — CBC WITH DIFFERENTIAL/PLATELET
Basophils Absolute: 0 10*3/uL (ref 0–0.1)
Basophils Relative: 1 %
EOS ABS: 0 10*3/uL (ref 0–0.7)
EOS PCT: 1 %
HCT: 49 % (ref 40.0–52.0)
Hemoglobin: 16.9 g/dL (ref 13.0–18.0)
LYMPHS ABS: 0.9 10*3/uL — AB (ref 1.0–3.6)
LYMPHS PCT: 13 %
MCH: 31.8 pg (ref 26.0–34.0)
MCHC: 34.5 g/dL (ref 32.0–36.0)
MCV: 92.2 fL (ref 80.0–100.0)
MONO ABS: 0.7 10*3/uL (ref 0.2–1.0)
Monocytes Relative: 10 %
Neutro Abs: 5.8 10*3/uL (ref 1.4–6.5)
Neutrophils Relative %: 77 %
PLATELETS: 167 10*3/uL (ref 150–440)
RBC: 5.32 MIL/uL (ref 4.40–5.90)
RDW: 14.5 % (ref 11.5–14.5)
WBC: 7.5 10*3/uL (ref 3.8–10.6)

## 2015-03-30 LAB — BASIC METABOLIC PANEL
Anion gap: 6 (ref 5–15)
BUN: 10 mg/dL (ref 6–20)
CALCIUM: 8.8 mg/dL — AB (ref 8.9–10.3)
CO2: 27 mmol/L (ref 22–32)
CREATININE: 1.03 mg/dL (ref 0.61–1.24)
Chloride: 107 mmol/L (ref 101–111)
GFR calc Af Amer: >60 mL/min (ref >60)
GLUCOSE: 136 mg/dL — AB (ref 65–99)
Potassium: 3.5 mmol/L (ref 3.5–5.1)
Sodium: 140 mmol/L (ref 135–145)

## 2015-03-30 MED ORDER — IPRATROPIUM-ALBUTEROL 0.5-2.5 (3) MG/3ML IN SOLN
3.0000 mL | Freq: Once | RESPIRATORY_TRACT | Status: AC
  Administered 2015-03-30: 3 mL via RESPIRATORY_TRACT

## 2015-03-30 MED ORDER — OXYCODONE HCL 5 MG PO TABS: 5.0000 mg | ORAL_TABLET | ORAL | Status: DC | PRN

## 2015-03-30 MED ORDER — IPRATROPIUM-ALBUTEROL 0.5-2.5 (3) MG/3ML IN SOLN
RESPIRATORY_TRACT | Status: AC
  Filled 2015-03-30: qty 3

## 2015-03-30 MED ORDER — IPRATROPIUM-ALBUTEROL 0.5-2.5 (3) MG/3ML IN SOLN
3.0000 mL | RESPIRATORY_TRACT | Status: DC | PRN
  Administered 2015-04-01 (×2): 3 mL via RESPIRATORY_TRACT
  Filled 2015-03-30 (×2): qty 3

## 2015-03-30 MED ORDER — METHYLPREDNISOLONE SODIUM SUCC 125 MG IJ SOLR
125.0000 mg | Freq: Once | INTRAMUSCULAR | Status: AC
  Administered 2015-03-30: 125 mg via INTRAVENOUS
  Filled 2015-03-30: qty 2

## 2015-03-30 MED ORDER — DEXTROSE 5 % IV SOLN
500.0000 mg | INTRAVENOUS | Status: DC
  Administered 2015-03-31: 500 mg via INTRAVENOUS
  Filled 2015-03-30 (×3): qty 500

## 2015-03-30 MED ORDER — IPRATROPIUM-ALBUTEROL 0.5-2.5 (3) MG/3ML IN SOLN
3.0000 mL | Freq: Once | RESPIRATORY_TRACT | Status: AC
  Administered 2015-03-30: 3 mL via RESPIRATORY_TRACT
  Filled 2015-03-30: qty 3

## 2015-03-30 MED ORDER — ONDANSETRON HCL 4 MG/2ML IJ SOLN
4.0000 mg | Freq: Four times a day (QID) | INTRAMUSCULAR | Status: DC | PRN
  Administered 2015-03-31 – 2015-04-01 (×2): 4 mg via INTRAVENOUS
  Filled 2015-03-30 (×3): qty 2

## 2015-03-30 MED ORDER — ADULT MULTIVITAMIN W/MINERALS CH
1.0000 | ORAL_TABLET | Freq: Every day | ORAL | Status: DC
  Administered 2015-03-30 – 2015-04-01 (×3): 1 via ORAL
  Filled 2015-03-30 (×3): qty 1

## 2015-03-30 MED ORDER — ONDANSETRON HCL 4 MG/2ML IJ SOLN
4.0000 mg | Freq: Once | INTRAMUSCULAR | Status: AC
  Administered 2015-03-30: 4 mg via INTRAVENOUS
  Filled 2015-03-30: qty 2

## 2015-03-30 MED ORDER — PANTOPRAZOLE SODIUM 40 MG PO TBEC
40.0000 mg | DELAYED_RELEASE_TABLET | Freq: Every day | ORAL | Status: DC
  Administered 2015-03-30 – 2015-04-01 (×3): 40 mg via ORAL
  Filled 2015-03-30 (×3): qty 1

## 2015-03-30 MED ORDER — DEXTROSE 5 % IV SOLN
500.0000 mg | Freq: Once | INTRAVENOUS | Status: AC
  Administered 2015-03-30: 500 mg via INTRAVENOUS
  Filled 2015-03-30: qty 500

## 2015-03-30 MED ORDER — ACETAMINOPHEN 325 MG PO TABS: 650.0000 mg | ORAL_TABLET | Freq: Four times a day (QID) | ORAL | Status: DC | PRN

## 2015-03-30 MED ORDER — DOCUSATE SODIUM 100 MG PO CAPS
100.0000 mg | ORAL_CAPSULE | Freq: Every day | ORAL | Status: DC
  Administered 2015-03-31 – 2015-04-07 (×8): 100 mg via ORAL
  Filled 2015-03-30 (×8): qty 1

## 2015-03-30 MED ORDER — GUAIFENESIN 100 MG/5ML PO SOLN
10.0000 mL | ORAL | Status: DC | PRN
  Administered 2015-03-30: 200 mg via ORAL
  Filled 2015-03-30 (×2): qty 10

## 2015-03-30 MED ORDER — ACETAMINOPHEN 650 MG RE SUPP: 650.0000 mg | Freq: Four times a day (QID) | RECTAL | Status: DC | PRN

## 2015-03-30 MED ORDER — HEPARIN SODIUM (PORCINE) 5000 UNIT/ML IJ SOLN
5000.0000 [IU] | Freq: Three times a day (TID) | INTRAMUSCULAR | Status: DC
  Administered 2015-03-30 – 2015-04-01 (×5): 5000 [IU] via SUBCUTANEOUS
  Filled 2015-03-30 (×5): qty 1

## 2015-03-30 MED ORDER — ONDANSETRON HCL 4 MG PO TABS: 4.0000 mg | ORAL_TABLET | Freq: Four times a day (QID) | ORAL | Status: DC | PRN

## 2015-03-30 NOTE — ED Notes (Signed)
Patient reports productive cough since Saturday. Patient states that he coughs so much that he vomits. Patient reports temp ranging from 99-101. Patient has been taking Delsym and Mucinex without relief.

## 2015-03-30 NOTE — ED Notes (Signed)
Patient O2 sats dropped to 86% on room air, patient placed on 3 L of O2. Dr. Archie Balboa informed.

## 2015-03-30 NOTE — ED Provider Notes (Signed)
[facility] Emergency Department Provider Note   ____________________________________________  Time seen: 1640  I have reviewed the triage vital signs and the nursing notes.   HISTORY  Chief Complaint Cough and Fever   History limited by: Not Limited   HPI James [name] is a 50 y.o. male with history of muscular dystrophy who comes in because of productive cough and shortness of breath. The patient states that these symptoms have been going on for two days. They have been fairly constant. He is producing yellow brown phlegm. He has been trying mucinex and delsym without significant relief. The patient states he does have a history of pneumonia. He has had intermittent fevers.      Past Medical History  Diagnosis Date  . GERD (gastroesophageal reflux disease)   . History of chicken pox   . Myotonic dystrophy, type 1 ([place])     followed by [facility]  . History of pneumonia 2013    double PNA at [facility]  . LBBB (left bundle branch block)   . Cardiomyopathy [facility])     Patient Active Problem List   Diagnosis Date Noted  . Sore throat 03/26/2015  . Erectile dysfunction 06/01/2013  . Hypogonadism male 06/01/2013  . GERD (gastroesophageal reflux disease)   . Cardiomyopathy ([place])   . LBBB (left bundle branch block)   . Myotonic dystrophy, type 1 ([place])     Past Surgical History  Procedure Laterality Date  . US echocardiography  09/2010    EF 50%, mld LVH and dysfunction, mild RV dysfunction and enlarged    Current Outpatient Rx  Name  Route  Sig  Dispense  Refill  . acetaminophen (TYLENOL) 325 MG tablet   Oral   Take 650 mg by mouth every 6 (six) hours as needed.         Marland Kitchen dextromethorphan (DELSYM) 30 MG/5ML liquid   Oral   Take 30 mg by mouth 2 (two) times daily as needed for cough.         . docusate sodium (COLACE) 50 MG capsule   Oral   Take 50 mg by mouth daily.         Marland Kitchen guaiFENesin (MUCINEX) 600 MG 12 hr tablet   Oral   Take 600 mg  by mouth 2 (two) times daily.         . Multiple Vitamin (MULTIVITAMIN) tablet   Oral   Take 1 tablet by mouth daily.         Marland Kitchen omeprazole (PRILOSEC) 20 MG capsule   Oral   Take by mouth.           Allergies Codeine  Family History  Problem Relation Age of Onset  . Hypertension Father   . Cancer Paternal Grandfather     lung, smoker  . Cancer Paternal Uncle     bone  . Diabetes Mother   . CAD Mother 5    MI  . Stroke Neg Hx     Social History Social History  Substance Use Topics  . Smoking status: Never Smoker   . Smokeless tobacco: Never Used  . Alcohol Use: No    Review of Systems  Constitutional: Positive for fever. Cardiovascular: Negative for chest pain. Respiratory: Positive for shortness of breath and cough Gastrointestinal: Negative for abdominal pain, vomiting and diarrhea. Genitourinary: Negative for dysuria. Musculoskeletal: Negative for back pain. Skin: Negative for rash.  10-point ROS otherwise negative.  ____________________________________________   PHYSICAL EXAM:  VITAL SIGNS: ED Triage  Vitals  Enc Vitals Group     BP 03/30/15 1506 116/76 mmHg     Pulse Rate 03/30/15 1506 112     Resp 03/30/15 1506 18     Temp 03/30/15 1506 98.2 F (36.8 C)     Temp Source 03/30/15 1506 Oral     SpO2 03/30/15 1506 91 %     Weight 03/30/15 1506 200 lb (90.719 kg)     Height 03/30/15 1506 5\' 11"  (1.803 m)     Head Cir --      Peak Flow --      Pain Score 03/30/15 1508 0   Constitutional: Alert and oriented. Well appearing and in no distress. Eyes: Conjunctivae are normal. PERRL. Normal extraocular movements. ENT   Head: Normocephalic and atraumatic.   Nose: No congestion/rhinnorhea.   Mouth/Throat: Mucous membranes are moist.   Neck: No stridor. Hematological/Lymphatic/Immunilogical: No cervical lymphadenopathy. Cardiovascular: Tachycardic, regular rhythm.  No murmurs, rubs, or gallops. Respiratory: Mild respiratory  distress. Diffuse wheezing. Gastrointestinal: Soft and nontender. No distention.  Genitourinary: Deferred Musculoskeletal: Normal range of motion in all extremities. No joint effusions.  No lower extremity tenderness nor edema. Neurologic:  Normal speech and language. No gross focal neurologic deficits are appreciated.  Skin:  Skin is warm, dry and intact. No rash noted. Psychiatric: Mood and affect are normal. Speech and behavior are normal. Patient exhibits appropriate insight and judgment.  ____________________________________________    LABS (pertinent positives/negatives)  Labs Reviewed  CBC WITH DIFFERENTIAL/PLATELET - Abnormal; Notable for the following:    Lymphs Abs 0.9 (*)    All other components within normal limits  BASIC METABOLIC PANEL - Abnormal; Notable for the following:    Glucose, Bld 136 (*)    Calcium 8.8 (*)    All other components within normal limits     ____________________________________________   EKG  None  ____________________________________________    RADIOLOGY  CXR IMPRESSION: Probable minimal subsegmental atelectasis seen laterally in left midlung.   ____________________________________________   PROCEDURES  Procedure(s) performed: None  Critical Care performed: No  ____________________________________________   INITIAL IMPRESSION / ASSESSMENT AND PLAN / ED COURSE  Pertinent labs & imaging results that were available during my care of the patient were reviewed by me and considered in my medical decision making (see chart for details).  Patient presented to the emergency department today because of a productive cough. States it is been going on for roughly 2 days. Patient did have diffuse wheezing on exam. He stated he did feel somewhat better after DuoNeb treatments. He also receives on Medrol in the ED. However after 3 treatments he was still hypoxic. Patient does not wear oxygen at home. Given this will plan on admission to  the hospital service for further workup and management.  ____________________________________________   FINAL CLINICAL IMPRESSION(S) / ED DIAGNOSES  Final diagnoses:  Hypoxia  Wheezing     Nance Pear, MD 03/30/15 2140

## 2015-03-30 NOTE — ED Notes (Signed)
Cough for the past two days.  Cough worsening.  Productive cough yellow/ brownish sputum.  States intermittent vomiting with cough.  Intermittent fevers for the past two days.  Tylenol taken this morning at 0730.  Patient has history of Muscular Dystrophy and pneumonia.

## 2015-03-30 NOTE — H&P (Signed)
[place] [facility] [place] NAME: James [name]    MR#:  [id number]  DATE OF BIRTH:  [birth year]   DATE OF ADMISSION:  03/30/2015  PRIMARY CARE PHYSICIAN: [name], MD   REQUESTING/REFERRING PHYSICIAN: Archie Balboa  CHIEF COMPLAINT:   Chief Complaint  Patient presents with  . Cough  . Fever    HISTORY OF PRESENT ILLNESS:  James [name]  is a 50 y.o. male with a known history of myotonic dystrophy type I presenting with shortness of breath and cough. 4 day duration of cough productive of yellowish sputum, posttussive emesis, subjective fever/chills as well as shortness of breath. He noticed that his oxygen saturation was in the 80s on room air at home thus, to the Hospital further workup and evaluation.  PAST MEDICAL HISTORY:   Past Medical History  Diagnosis Date  . GERD (gastroesophageal reflux disease)   . History of chicken pox   . Myotonic dystrophy, type 1 ([place])     followed by [facility]  . History of pneumonia 2013    double PNA at [facility]  . LBBB (left bundle branch block)   . Cardiomyopathy ([place])     PAST SURGICAL HISTORY:   Past Surgical History  Procedure Laterality Date  . US echocardiography  09/2010    EF 50%, mld LVH and dysfunction, mild RV dysfunction and enlarged    SOCIAL HISTORY:   Social History  Substance Use Topics  . Smoking status: Never Smoker   . Smokeless tobacco: Never Used  . Alcohol Use: No    FAMILY HISTORY:   Family History  Problem Relation Age of Onset  . Hypertension Father   . Cancer Paternal Grandfather     lung, smoker  . Cancer Paternal Uncle     bone  . Diabetes Mother   . CAD Mother 72    MI  . Stroke Neg Hx     DRUG ALLERGIES:   Allergies  Allergen Reactions  . Codeine Nausea Only    REVIEW OF SYSTEMS:  REVIEW OF SYSTEMS:  CONSTITUTIONAL:Positive fevers, chills, fatigue, weakness.  EYES: Denies blurred vision, double vision, or eye pain.  EARS, NOSE, THROAT:  Denies tinnitus, ear pain, hearing loss.  RESPIRATORY: positive cough, shortness of breath,  denies wheezing  CARDIOVASCULAR: Denies chest pain, palpitations, edema.  GASTROINTESTINAL: Denies nausea, vomiting, diarrhea, abdominal pain.  GENITOURINARY: Denies dysuria, hematuria.  ENDOCRINE: Denies nocturia or thyroid problems. HEMATOLOGIC AND LYMPHATIC: Denies easy bruising or bleeding.  SKIN: Denies rash or lesions.  MUSCULOSKELETAL: Denies pain in neck, back, shoulder, knees, hips, or further arthritic symptoms.  NEUROLOGIC: Denies paralysis, paresthesias.  PSYCHIATRIC: Denies anxiety or depressive symptoms. Otherwise full review of systems performed by me is negative.   MEDICATIONS AT HOME:   Prior to Admission medications   Medication Sig Start Date End Date Taking? Authorizing Provider  acetaminophen (TYLENOL) 325 MG tablet Take 650 mg by mouth every 6 (six) hours as needed.   Yes Historical Provider, MD  dextromethorphan (DELSYM) 30 MG/5ML liquid Take 30 mg by mouth 2 (two) times daily as needed for cough.   Yes Historical Provider, MD  docusate sodium (COLACE) 50 MG capsule Take 50 mg by mouth daily.   Yes Historical Provider, MD  guaiFENesin (MUCINEX) 600 MG 12 hr tablet Take 600 mg by mouth 2 (two) times daily.   Yes Historical Provider, MD  Multiple Vitamin (MULTIVITAMIN) tablet Take 1 tablet by mouth daily.   Yes Historical Provider, MD  omeprazole (PRILOSEC) 20 MG capsule Take by mouth.   Yes Historical Provider, MD      VITAL SIGNS:  Blood pressure 119/82, pulse 81, temperature 98.2 F (36.8 C), temperature source Oral, resp. rate 20, height 5\' 11"  (1.803 m), weight 200 lb (90.719 kg), SpO2 93 %.  PHYSICAL EXAMINATION:  VITAL SIGNS: Filed Vitals:   03/30/15 1800 03/30/15 1830  BP: 127/88 119/82  Pulse: 93 81  Temp:    Resp: 20    GENERAL:49 y.o.male currently in no acute distress.  HEAD: Normocephalic, atraumatic.  EYES: Pupils equal, round, reactive to light.  Extraocular muscles intact. No scleral icterus.  MOUTH: Moist mucosal membrane. Dentition intact. No abscess noted.  EAR, NOSE, THROAT: Clear without exudates. No external lesions.  NECK: Supple. No thyromegaly. No nodules. No JVD.  PULMONARY: greatly diminished breath sounds in all lung fields with scattered basal rhonchi no wheezing at this time. No use of accessory muscles poor respiratory effort. poor air entry bilaterally CHEST: Nontender to palpation.  CARDIOVASCULAR: S1 and S2. Regular rate and rhythm. No murmurs, rubs, or gallops. No edema. Pedal pulses 2+ bilaterally.  GASTROINTESTINAL: Soft, nontender, nondistended. No masses. Positive bowel sounds. No hepatosplenomegaly.  MUSCULOSKELETAL: No swelling, clubbing, or edema. Range of motion full in all extremities.  evidence of muscular wasting forearms bilateral  NEUROLOGIC: Cranial nerves II through XII are intact. No gross focal neurological deficits. Sensation intact. Reflexes intact.  SKIN: No ulceration, lesions, rashes, or cyanosis. Skin warm and dry. Turgor intact.  PSYCHIATRIC: Mood, affect within normal limits. The patient is awake, alert and oriented x 3. Insight, judgment intact.    LABORATORY PANEL:   CBC  Recent Labs Lab 03/30/15 1518  WBC 7.5  HGB 16.9  HCT 49.0  PLT 167   ------------------------------------------------------------------------------------------------------------------  Chemistries   Recent Labs Lab 03/30/15 1518  NA 140  K 3.5  CL 107  CO2 27  GLUCOSE 136*  BUN 10  CREATININE 1.03  CALCIUM 8.8*   ------------------------------------------------------------------------------------------------------------------  Cardiac Enzymes No results for input(s): TROPONINI in the last 168 hours. ------------------------------------------------------------------------------------------------------------------  RADIOLOGY:  Dg Chest 2 View  03/30/2015  CLINICAL DATA:  Productive cough. EXAM:  CHEST  2 VIEW COMPARISON:  April 15, 2014. FINDINGS: The heart size and mediastinal contours are within normal limits. No pneumothorax or pleural effusion is noted. Right lung is clear. Probable minimal subsegmental atelectasis is noted laterally in the left midlung. The visualized skeletal structures are unremarkable. IMPRESSION: Probable minimal subsegmental atelectasis seen laterally in left midlung. Electronically Signed   By: Marijo Conception, M.D.   On: 03/30/2015 16:12    EKG:   Orders placed or performed in visit on 01/23/14  . EKG 12-Lead    IMPRESSION AND PLAN:    50 year old Caucasian gentleman history of myotonic dystrophy presenting with shortness of breath cough saturating in the mid 80s on room air  1. Acute respiratory insufficiency: Likely combination of bronchitis in the addition to his myotonic dystrophy. Supplemental oxygen, breathing treatments, azithromycin  2. GERD without esophagitis PPI therapy 3. Venous thromboembolism prophylactic: Heparin subcutaneous  l the records are reviewed and case discussed with ED provider. Management plans discussed with the patient, family and they are in agreement.  CODE STATUfull TOTAL TIME TAKING CARE OF THIS PATIENT:59minutes.    Melani Brisbane,  Karenann Cai.D on 03/30/2015 at 9:01 PM  Between 7am to 6pm - Pager - [phone]  After 6pm: House Pager: - [place] [facility]  Office  [phone]  CC: Primary care physician; [name], MD

## 2015-03-31 MED ORDER — HYDROCOD POLST-CPM POLST ER 10-8 MG/5ML PO SUER: 5.0000 mL | Freq: Two times a day (BID) | ORAL | Status: DC

## 2015-03-31 MED ORDER — BENZONATATE 100 MG PO CAPS
200.0000 mg | ORAL_CAPSULE | Freq: Three times a day (TID) | ORAL | Status: DC
  Administered 2015-03-31 – 2015-04-01 (×4): 200 mg via ORAL
  Filled 2015-03-31: qty 1
  Filled 2015-03-31 (×5): qty 2

## 2015-03-31 MED ORDER — DEXTROSE 5 % IV SOLN
1.0000 g | INTRAVENOUS | Status: DC
  Administered 2015-03-31 – 2015-04-01 (×2): 1 g via INTRAVENOUS
  Filled 2015-03-31 (×2): qty 10

## 2015-03-31 MED ORDER — GUAIFENESIN-CODEINE 100-10 MG/5ML PO SOLN
10.0000 mL | Freq: Four times a day (QID) | ORAL | Status: DC | PRN
  Filled 2015-03-31: qty 10

## 2015-03-31 MED ORDER — GUAIFENESIN-DM 100-10 MG/5ML PO SYRP
10.0000 mL | ORAL_SOLUTION | ORAL | Status: DC | PRN
  Administered 2015-03-31 (×2): 10 mL via ORAL
  Filled 2015-03-31 (×2): qty 10

## 2015-03-31 MED ORDER — MOMETASONE FURO-FORMOTEROL FUM 100-5 MCG/ACT IN AERO
2.0000 | INHALATION_SPRAY | Freq: Two times a day (BID) | RESPIRATORY_TRACT | Status: DC
  Administered 2015-03-31 – 2015-04-02 (×4): 2 via RESPIRATORY_TRACT
  Filled 2015-03-31: qty 8.8

## 2015-03-31 NOTE — Progress Notes (Signed)
[facility] NAME: James [name]    MR#:  [id number]  DATE OF BIRTH:  Apr 29, 1965  SUBJECTIVE:  CHIEF COMPLAINT:   Chief Complaint  Patient presents with  . Cough  . Fever   - Patient with muscular dystrophy, weakness presents with worsening cough. -Still complains of difficulty breathing and cough which haven't improved significantly since admission. -No fevers noted.  REVIEW OF SYSTEMS:  Review of Systems  Constitutional: Negative for fever and chills.  Eyes: Negative for blurred vision.  Respiratory: Positive for cough, sputum production and shortness of breath. Negative for wheezing.   Cardiovascular: Negative for chest pain and palpitations.  Gastrointestinal: Negative for nausea, vomiting, abdominal pain, diarrhea and constipation.  Genitourinary: Negative for dysuria.  Musculoskeletal: Positive for myalgias.  Neurological: Positive for weakness. Negative for dizziness, tremors, sensory change, speech change, seizures and headaches.  Psychiatric/Behavioral: Negative for depression.    DRUG ALLERGIES:   Allergies  Allergen Reactions  . Codeine Nausea Only    VITALS:  Blood pressure 125/80, pulse 85, temperature 98.1 F (36.7 C), temperature source Oral, resp. rate 18, height 5\' 11"  (1.803 m), weight 86.546 kg (190 lb 12.8 oz), SpO2 96 %.  PHYSICAL EXAMINATION:  Physical Exam  GENERAL:  50 y.o.-year-old patient lying in the bed with no acute distress.  EYES: Pupils equal, round, reactive to light and accommodation. No scleral icterus. Extraocular muscles intact.  HEENT: Head atraumatic, normocephalic. Oropharynx and nasopharynx clear.  NECK:  Supple, no jugular venous distention. No thyroid enlargement, no tenderness.  LUNGS: Diffuse rhonchi at the bases, left greater than right. Moving air bilaterally otherwise. No wheezing crackles or rales.. No use of accessory muscles of respiration.  CARDIOVASCULAR: S1,  S2 normal. No  rubs, or gallops. 3/6 systolic murmur is present ABDOMEN: Soft, nontender, nondistended. Bowel sounds present. No organomegaly or mass.  EXTREMITIES: No pedal edema, cyanosis, or clubbing.  NEUROLOGIC: Cranial nerves II through XII are intact. Muscle strength 5/5 in all extremities. Sensation intact. Gait not checked. Generalized weakness is present PSYCHIATRIC: The patient is alert and oriented x 3.  SKIN: No obvious rash, lesion, or ulcer.    LABORATORY PANEL:   CBC  Recent Labs Lab 03/30/15 1518  WBC 7.5  HGB 16.9  HCT 49.0  PLT 167   ------------------------------------------------------------------------------------------------------------------  Chemistries   Recent Labs Lab 03/30/15 1518  NA 140  K 3.5  CL 107  CO2 27  GLUCOSE 136*  BUN 10  CREATININE 1.03  CALCIUM 8.8*   ------------------------------------------------------------------------------------------------------------------  Cardiac Enzymes No results for input(s): TROPONINI in the last 168 hours. ------------------------------------------------------------------------------------------------------------------  RADIOLOGY:  Dg Chest 2 View  03/30/2015  CLINICAL DATA:  Productive cough. EXAM: CHEST  2 VIEW COMPARISON:  April 15, 2014. FINDINGS: The heart size and mediastinal contours are within normal limits. No pneumothorax or pleural effusion is noted. Right lung is clear. Probable minimal subsegmental atelectasis is noted laterally in the left midlung. The visualized skeletal structures are unremarkable. IMPRESSION: Probable minimal subsegmental atelectasis seen laterally in left midlung. Electronically Signed   By: Marijo Conception, M.D.   On: 03/30/2015 16:12    EKG:   Orders placed or performed in visit on 01/23/14  . 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  #1 acute hypoxic respiratory  failure-secondary to pneumonia as noted on x-ray in the left mid to low. -Difficulty coughing  due to weakness from his myotonic dystrophy. -Added cough medicines. Continue supplemental oxygen. -Continue duonebs and also antibiotics-added Rocephin to his azithromycin.  #2 GERD-on Protonix  #3 myotonic dystrophy-physical therapy. Ambulates at baseline without any assistance. Has a walker and also cane.  #4 DVT prophylaxis-on subcutaneous heparin  Physical therapy consult prior to discharge.   All the records are reviewed and case discussed with Care Management/Social Workerr. Management plans discussed with the patient, family and they are in agreement.  CODE STATUS: Full Code  TOTAL TIME TAKING CARE OF THIS PATIENT: 37 minutes.   POSSIBLE D/C IN 1-2 DAYS, DEPENDING ON CLINICAL CONDITION.   Gladstone Lighter M.D on 03/31/2015 at 4:36 PM  Between 7am to 6pm - Pager - [phone]  After 6pm go to [email or web address] - password [email or web address] [facility]  [facility]  Office  [phone]  CC: Primary care physician; [name], MD

## 2015-03-31 NOTE — Progress Notes (Deleted)
Discharge information given to patient, all questions answered, patient transported by [facility] to his home where his wife is waiting. Difficult to provide information to patient due to hearing and information. All information in handout. [facility] staff/nurse will also follow patient to be sure instructions are followed.

## 2015-03-31 NOTE — Progress Notes (Signed)
ANTIBIOTIC CONSULT NOTE - INITIAL  Pharmacy Consult for Ceftriaxone Indication: pneumonia (CAP)  Allergies  Allergen Reactions  . Codeine Nausea Only    Patient Measurements: Height: 5\' 11"  (180.3 cm) Weight: 190 lb 12.8 oz (86.546 kg) IBW/kg (Calculated) : 75.3   Vital Signs: BP: 125/80 mmHg (01/03 1302) Pulse Rate: 85 (01/03 1302) Intake/Output from previous day:   Intake/Output from this shift: Total I/O In: 240 [P.O.:240] Out: -   Labs:  Recent Labs  03/30/15 1518  WBC 7.5  HGB 16.9  PLT 167  CREATININE 1.03   Estimated Creatinine Clearance: 92.4 mL/min (by C-G formula based on Cr of 1.03). No results for input(s): VANCOTROUGH, VANCOPEAK, VANCORANDOM, GENTTROUGH, GENTPEAK, GENTRANDOM, TOBRATROUGH, TOBRAPEAK, TOBRARND, AMIKACINPEAK, AMIKACINTROU, AMIKACIN in the last 72 hours.   Microbiology: No results found for this or any previous visit (from the past 720 hour(s)).  Medical History: Past Medical History  Diagnosis Date  . GERD (gastroesophageal reflux disease)   . History of chicken pox   . Myotonic dystrophy, type 1 ([place])     followed by [facility]  . History of pneumonia 2013    double PNA at [facility]  . LBBB (left bundle branch block)   . Cardiomyopathy [facility])     Assessment: 50 yo male already on azithromycin adding ceftriaxone for CAP.  Goal of Therapy:  Resolution of infection  Plan:  Will order ceftriaxone 1 g IV q24h.    Rocky Morel 03/31/2015,6:43 PM

## 2015-03-31 NOTE — Plan of Care (Signed)
Problem: Education: Goal: Knowledge of [place] [facility] Education information/materials will improve Outcome: Progressing Pt is alert and oriented, no c/o pain at this time. 3 L [place] on at this time, normal oxygen saturation. Wife at bedside, supportive.  Problem: Safety: Goal: Ability to remain free from injury will improve Outcome: Progressing Pt is a moderate fall risk. Independent in room. Encouraged to call for assistance when needed.

## 2015-04-01 ENCOUNTER — Inpatient Hospital Stay: Payer: Commercial Managed Care - HMO

## 2015-04-01 DIAGNOSIS — R0689 Other abnormalities of breathing: Secondary | ICD-10-CM

## 2015-04-01 DIAGNOSIS — J690 Pneumonitis due to inhalation of food and vomit: Principal | ICD-10-CM

## 2015-04-01 LAB — BASIC METABOLIC PANEL
ANION GAP: 6 (ref 5–15)
BUN: 19 mg/dL (ref 6–20)
CHLORIDE: 107 mmol/L (ref 101–111)
CO2: 30 mmol/L (ref 22–32)
CREATININE: 0.94 mg/dL (ref 0.61–1.24)
Calcium: 8.5 mg/dL — ABNORMAL LOW (ref 8.9–10.3)
GFR calc non Af Amer: >60 mL/min (ref >60)
Glucose, Bld: 114 mg/dL — ABNORMAL HIGH (ref 65–99)
POTASSIUM: 3.4 mmol/L — AB (ref 3.5–5.1)
SODIUM: 143 mmol/L (ref 135–145)

## 2015-04-01 LAB — CBC
HEMATOCRIT: 44.2 % (ref 40.0–52.0)
HEMOGLOBIN: 14.6 g/dL (ref 13.0–18.0)
MCH: 30.6 pg (ref 26.0–34.0)
MCHC: 33 g/dL (ref 32.0–36.0)
MCV: 92.6 fL (ref 80.0–100.0)
Platelets: 172 10*3/uL (ref 150–440)
RBC: 4.77 MIL/uL (ref 4.40–5.90)
RDW: 14.6 % — ABNORMAL HIGH (ref 11.5–14.5)
WBC: 9.6 10*3/uL (ref 3.8–10.6)

## 2015-04-01 LAB — MRSA PCR SCREENING: MRSA by PCR: NEGATIVE

## 2015-04-01 MED ORDER — IPRATROPIUM-ALBUTEROL 0.5-2.5 (3) MG/3ML IN SOLN
3.0000 mL | RESPIRATORY_TRACT | Status: DC
  Administered 2015-04-01 – 2015-04-02 (×5): 3 mL via RESPIRATORY_TRACT
  Filled 2015-04-01 (×6): qty 3

## 2015-04-01 MED ORDER — AZITHROMYCIN 250 MG PO TABS
500.0000 mg | ORAL_TABLET | Freq: Every day | ORAL | Status: DC
  Administered 2015-04-01: 500 mg via ORAL
  Filled 2015-04-01: qty 2

## 2015-04-01 MED ORDER — FLUMAZENIL 0.5 MG/5ML IV SOLN
0.2000 mg | Freq: Once | INTRAVENOUS | Status: AC
  Administered 2015-04-01: 0.2 mg via INTRAVENOUS
  Filled 2015-04-01: qty 5

## 2015-04-01 MED ORDER — HYDROCOD POLST-CPM POLST ER 10-8 MG/5ML PO SUER
5.0000 mL | Freq: Two times a day (BID) | ORAL | Status: DC | PRN
  Administered 2015-04-01: 5 mL via ORAL
  Filled 2015-04-01: qty 5

## 2015-04-01 MED ORDER — HYDROCOD POLST-CPM POLST ER 10-8 MG/5ML PO SUER: 5.0000 mL | Freq: Two times a day (BID) | ORAL | Status: DC

## 2015-04-01 MED ORDER — ENOXAPARIN SODIUM 40 MG/0.4ML ~~LOC~~ SOLN
40.0000 mg | SUBCUTANEOUS | Status: DC
  Administered 2015-04-01 – 2015-04-06 (×6): 40 mg via SUBCUTANEOUS
  Filled 2015-04-01 (×6): qty 0.4

## 2015-04-01 MED ORDER — ALPRAZOLAM 0.5 MG PO TABS: 0.5000 mg | ORAL_TABLET | Freq: Three times a day (TID) | ORAL | Status: DC

## 2015-04-01 MED ORDER — ALPRAZOLAM 0.5 MG PO TABS
0.5000 mg | ORAL_TABLET | Freq: Three times a day (TID) | ORAL | Status: DC
  Administered 2015-04-01: 11:00:00 0.5 mg via ORAL
  Filled 2015-04-01 (×2): qty 1

## 2015-04-01 MED ORDER — MORPHINE SULFATE (PF) 2 MG/ML IV SOLN: 1.0000 mg | INTRAVENOUS | Status: DC | PRN

## 2015-04-01 MED ORDER — POTASSIUM CHLORIDE CRYS ER 20 MEQ PO TBCR
40.0000 meq | EXTENDED_RELEASE_TABLET | Freq: Once | ORAL | Status: DC
  Filled 2015-04-01: qty 2

## 2015-04-01 MED ORDER — BUDESONIDE 0.5 MG/2ML IN SUSP
0.5000 mg | Freq: Two times a day (BID) | RESPIRATORY_TRACT | Status: DC
  Administered 2015-04-01 – 2015-04-07 (×12): 0.5 mg via RESPIRATORY_TRACT
  Filled 2015-04-01 (×12): qty 2

## 2015-04-01 MED ORDER — LORAZEPAM 2 MG/ML IJ SOLN
2.0000 mg | Freq: Once | INTRAMUSCULAR | Status: AC
  Administered 2015-04-01: 2 mg via INTRAVENOUS
  Filled 2015-04-01: qty 1

## 2015-04-01 MED ORDER — ONDANSETRON HCL 4 MG/2ML IJ SOLN
4.0000 mg | Freq: Four times a day (QID) | INTRAMUSCULAR | Status: AC
  Administered 2015-04-01 – 2015-04-02 (×4): 4 mg via INTRAVENOUS
  Filled 2015-04-01 (×2): qty 2

## 2015-04-01 MED ORDER — PANTOPRAZOLE SODIUM 40 MG IV SOLR
40.0000 mg | INTRAVENOUS | Status: DC
  Administered 2015-04-01 – 2015-04-02 (×2): 40 mg via INTRAVENOUS
  Filled 2015-04-01 (×2): qty 40

## 2015-04-01 MED ORDER — PIPERACILLIN-TAZOBACTAM 3.375 G IVPB
3.3750 g | Freq: Three times a day (TID) | INTRAVENOUS | Status: DC
  Administered 2015-04-01 – 2015-04-03 (×5): 3.375 g via INTRAVENOUS
  Filled 2015-04-01 (×7): qty 50

## 2015-04-01 MED ORDER — SCOPOLAMINE 1 MG/3DAYS TD PT72
1.0000 | MEDICATED_PATCH | TRANSDERMAL | Status: DC
  Administered 2015-04-01: 12:00:00 1.5 mg via TRANSDERMAL
  Filled 2015-04-01: qty 1

## 2015-04-01 NOTE — Progress Notes (Signed)
ANTIBIOTIC CONSULT NOTE - INITIAL  Pharmacy Consult for Zosyn Indication: Aspiration pneumonia  Allergies  Allergen Reactions  . Codeine Nausea Only    Patient Measurements: Height: 5\' 11"  (180.3 cm) Weight: 195 lb 15.8 oz (88.9 kg) IBW/kg (Calculated) : 75.3 Adjusted Body Weight:   Vital Signs: Temp: 99 F (37.2 C) (01/04 1731) Temp Source: Oral (01/04 1731) BP: 105/73 mmHg (01/04 1900) Pulse Rate: 86 (01/04 1900) Intake/Output from previous day: 01/03 0701 - 01/04 0700 In: 480 [P.O.:480] Out: -  Intake/Output from this shift:    Labs:  Recent Labs  03/30/15 1518 04/01/15 0509  WBC 7.5 9.6  HGB 16.9 14.6  PLT 167 172  CREATININE 1.03 0.94   Estimated Creatinine Clearance: 101.2 mL/min (by C-G formula based on Cr of 0.94). No results for input(s): VANCOTROUGH, VANCOPEAK, VANCORANDOM, GENTTROUGH, GENTPEAK, GENTRANDOM, TOBRATROUGH, TOBRAPEAK, TOBRARND, AMIKACINPEAK, AMIKACINTROU, AMIKACIN in the last 72 hours.   Microbiology: Recent Results (from the past 720 hour(s))  MRSA PCR Screening     Status: None   Collection Time: 04/01/15  5:32 PM  Result Value Ref Range Status   MRSA by PCR NEGATIVE NEGATIVE Final    Comment:        The GeneXpert MRSA Assay (FDA approved for NASAL specimens only), is one component of a comprehensive MRSA colonization surveillance program. It is not intended to diagnose MRSA infection nor to guide or monitor treatment for MRSA infections.     Medical History: Past Medical History  Diagnosis Date  . GERD (gastroesophageal reflux disease)   . History of chicken pox   . Myotonic dystrophy, type 1 ([place])     followed by [facility]  . History of pneumonia 2013    double PNA at [facility]  . LBBB (left bundle branch block)   . Cardiomyopathy ([place])     Medications:  Prescriptions prior to admission  Medication Sig Dispense Refill Last Dose  . acetaminophen (TYLENOL) 325 MG tablet Take 650 mg by mouth every 6 (six) hours as needed.    03/30/2015 at 0800  . dextromethorphan (DELSYM) 30 MG/5ML liquid Take 30 mg by mouth 2 (two) times daily as needed for cough.   03/29/2015 at Unknown time  . docusate sodium (COLACE) 50 MG capsule Take 50 mg by mouth as needed.    Past Week at Unknown time  . guaiFENesin (MUCINEX) 600 MG 12 hr tablet Take 600 mg by mouth 2 (two) times daily.   03/30/2015 at Unknown time  . Multiple Vitamin (MULTIVITAMIN) tablet Take 1 tablet by mouth daily.   Past Week at Unknown time  . omeprazole (PRILOSEC) 20 MG capsule Take by mouth daily.    Past Week at Unknown time   Scheduled:  . benzonatate  200 mg Oral TID  . budesonide (PULMICORT) nebulizer solution  0.5 mg Nebulization BID  . docusate sodium  100 mg Oral Daily  . enoxaparin (LOVENOX) injection  40 mg Subcutaneous Q24H  . ipratropium-albuterol  3 mL Nebulization Q4H  . mometasone-formoterol  2 puff Inhalation BID  . multivitamin with minerals  1 tablet Oral Daily  . ondansetron (ZOFRAN) IV  4 mg Intravenous 4 times per day  . pantoprazole  40 mg Oral Daily  . pantoprazole (PROTONIX) IV  40 mg Intravenous Q24H  . piperacillin-tazobactam (ZOSYN)  IV  3.375 g Intravenous 3 times per day  . potassium chloride  40 mEq Oral Once   Assessment: Pharmacy consulted to dose and monitor Zosyn in this 50 year old male  being treated for acute respiratory failure secondary to aspiration pneumonia.   Goal of Therapy:  Resolution of infection  Plan:  Will start Zosyn 3.375 g IV q8 hours.   Monigue Spraggins D 04/01/2015,7:19 PM

## 2015-04-01 NOTE — Plan of Care (Signed)
Blood gas results in CO2 = 64 and O2 - 46.  Going to move pt to unit for bipap.  Pulmonary consult ordered.  Romazicon not given.  Scopolamine patch pulled off.

## 2015-04-01 NOTE — Plan of Care (Signed)
Problem: Education: Goal: Knowledge of [place] [facility] Education information/materials will improve Outcome: Progressing Pt is alert and oriented, no c/o pain. C/o cough, scheduled tessalon pearls and robitussin given with little effect. Zofran given for nausea x1 with little improvement. Pt states he is coughing so much he is throwing up. Ativan 2 mg IV x1 given, helped patient rest, decreased coughing. One duoneb treatment for wheezing given.  Problem: Safety: Goal: Ability to remain free from injury will improve Outcome: Progressing Patient is a moderate fall risk, encouraged to call for assistance when needed. Family at bedside, supportive.

## 2015-04-01 NOTE — Progress Notes (Signed)
Pt transferred from room 112 due to lethargy and respiratory distress.  Pt is lethargic, will arouse and answer questions appropriately.  NSR/ST, lungs rhonchi and wheezing which improved with duoneb.  Pt placed on bipap.  Dr. Mortimer Fries came and saw pt, abx changed.  Dr. Mortimer Fries Discussed with wife code status, pt remains full code.  VSS.

## 2015-04-01 NOTE — H&P (Addendum)
PULMONARY CRITICAL CARE    PATIENT NAME: James [name]    MR#:  [id number]  DATE OF BIRTH:  Dec 02, 1965   DATE OF ADMISSION:  03/30/2015  PRIMARY CARE PHYSICIAN: [name], MD   REQUESTING/REFERRING PHYSICIAN: KALLISETTI  CHIEF COMPLAINT:   Acute resp failure   HISTORY OF PRESENT ILLNESS:  James [name]  is a 50 y.o. male with a known history of myotonic dystrophy type I presenting with shortness of breath and cough.report of  4 day duration of cough productive of yellowish sputum, posttussive emesis, subjective fever/chills as well as shortness of breath.  Patient unable to provide ROS due to his resp distress, placed on biPAP fio2 40% Wife at bedside-patient has porgressive decline in physical function over the past month-with breathing and with eating foods and liquids-with definite symptoms of aspiration   patient has had h/o pneumonia several years ago Dx with MD 2008  Patient with acute hypercapnic resp failure, was given anxiolytics for severe anxiety likely from inability to take deep breaths  CXR shows progressive opacity in left lung with low lung voluems  PAST MEDICAL HISTORY:   Past Medical History  Diagnosis Date  . GERD (gastroesophageal reflux disease)   . History of chicken pox   . Myotonic dystrophy, type 1 ([place])     followed by [facility]  . History of pneumonia 2013    [facility] PNA at [facility]  . LBBB (left bundle branch block)   . Cardiomyopathy ([place])     PAST SURGICAL HISTORY:   Past Surgical History  Procedure Laterality Date  . US echocardiography  09/2010    EF 50%, mld LVH and dysfunction, mild RV dysfunction and enlarged    SOCIAL HISTORY:   Social History  Substance Use Topics  . Smoking status: Never Smoker   . Smokeless tobacco: Never Used  . Alcohol Use: No    FAMILY HISTORY:   Family History  Problem Relation Age of Onset  . Hypertension Father   . Cancer Paternal Grandfather     lung, smoker  . Cancer Paternal Uncle     bone   . Diabetes Mother   . CAD Mother 13    MI  . Stroke Neg Hx     DRUG ALLERGIES:   Allergies  Allergen Reactions  . Codeine Nausea Only    REVIEW OF SYSTEMS:  Review of Systems  Unable to perform ROS: acuity of condition  Constitutional: Positive for fever, chills and malaise/fatigue.  Respiratory: Positive for cough, shortness of breath and wheezing.   Gastrointestinal: Positive for nausea and vomiting.  Psychiatric/Behavioral: The patient is nervous/anxious.       MEDICATIONS AT HOME:   Prior to Admission medications   Medication Sig Start Date End Date Taking? Authorizing Provider  acetaminophen (TYLENOL) 325 MG tablet Take 650 mg by mouth every 6 (six) hours as needed.   Yes Historical Provider, MD  dextromethorphan (DELSYM) 30 MG/5ML liquid Take 30 mg by mouth 2 (two) times daily as needed for cough.   Yes Historical Provider, MD  docusate sodium (COLACE) 50 MG capsule Take 50 mg by mouth daily.   Yes Historical Provider, MD  guaiFENesin (MUCINEX) 600 MG 12 hr tablet Take 600 mg by mouth 2 (two) times daily.   Yes Historical Provider, MD  Multiple Vitamin (MULTIVITAMIN) tablet Take 1 tablet by mouth daily.   Yes Historical Provider, MD  omeprazole (PRILOSEC) 20 MG capsule Take by mouth.   Yes Historical Provider, MD  VITAL SIGNS:  Blood pressure 107/77, pulse 101, temperature 99 F (37.2 C), temperature source Oral, resp. rate 21, height 5\' 11"  (1.803 m), weight 195 lb 15.8 oz (88.9 kg), SpO2 96 %.  PHYSICAL EXAMINATION:  VITAL SIGNS: Filed Vitals:   04/01/15 1800 04/01/15 1801  BP: 109/77 107/77  Pulse:  101  Temp:    Resp:  21   GENERAL:49 y.o.male currently in resp distress   HEAD: Normocephalic, atraumatic.  EYES: Pupils equal, round, reactive to light. Extraocular muscles intact. No scleral icterus.  NECK: Supple. No thyromegaly. No nodules. No JVD.  PULMONARY: greatly diminished breath sounds in all lung fields with scattered basal rhonchi +  wheezing +use of accessory musclesoor respiratory effort. poor air entry bilaterally CHEST: Nontender to palpation.  CARDIOVASCULAR: S1 and S2. Regular rate and rhythm. No murmurs, rubs, or gallops. No edema. Pedal pulses 2+ bilaterally.  GASTROINTESTINAL: Soft, nontender, nondistended. No masses. Positive bowel sounds. No hepatosplenomegaly.  MUSCULOSKELETAL: No swelling, clubbing, or edema. Range of motion full in all extremities.  evidence of muscular wasting forearms bilateral  SKIN: No ulceration, lesions, rashes, or cyanosis. Skin warm and dry. Turgor intact.  Lethargic, unable to speak , poor resp effort and cough reflex  LABORATORY PANEL:   CBC  Recent Labs Lab 04/01/15 0509  WBC 9.6  HGB 14.6  HCT 44.2  PLT 172   ------------------------------------------------------------------------------------------------------------------  Chemistries   Recent Labs Lab 04/01/15 0509  NA 143  K 3.4*  CL 107  CO2 30  GLUCOSE 114*  BUN 19  CREATININE 0.94  CALCIUM 8.5*   ------------------------------------------------------------------------------------------------------------------  Cardiac Enzymes No results for input(s): TROPONINI in the last 168 hours. ------------------------------------------------------------------------------------------------------------------  RADIOLOGY:  Dg Chest 2 View  04/01/2015  CLINICAL DATA:  Shortness of breath, cough EXAM: CHEST  2 VIEW COMPARISON:  03/30/2015 FINDINGS: Cardiomediastinal silhouette is stable. Study is limited by poor inspiration. Persistent linear atelectasis or infiltrate in lingula. Worsening atelectasis or infiltrate in left lower lobe. No pulmonary edema. IMPRESSION: Persistent linear atelectasis or infiltrate in lingula. Worsening atelectasis or infiltrate in left lower lobe. No pulmonary edema. Electronically Signed   By: Lahoma Crocker M.D.   On: 04/01/2015 12:20    EKG:   Orders placed or performed in visit on 01/23/14   . EKG 12-Lead    IMPRESSION AND PLAN:   50 yo white male with progressive resp failure with poor vent effort with aspiration pneumonia with underlying progressive Neuromuscular disease  I have discussed prognosis with wife, patient is critically ill, if biPAP does not work in next 48 hrs, patient will need Intubation and MV support And if he were to get intubated, then will need likely needTRACH and PEG tube. Patient is a full code  Patient at high risk for complications if he were to undergo Bronchoscopy at this time.   1.will start zosyn, stop azithro/rocephin 2.start dounebs every 4 hrs and pulmicort nebs  3.BiPAP support, repeat ABG, CXR as needed 4.gi prx-protonix 5.DVT prx-lovenox 6.avoid benzo, will use  morphine as needed  I have personally obtained a history, examined the patient, evaluated Pertinent laboratory and RadioGraphic/imaging results, and  formulated the assessment and plan   The Patient requires high complexity decision making for assessment and support, frequent evaluation and titration of therapies, application of advanced monitoring technologies and extensive interpretation of multiple databases. Critical Care Time devoted to patient care services described in this note is 45 minutes.   Overall, patient is critically ill, prognosis is guarded.  Patient with  Multiorgan failure and at high risk for cardiac arrest and death.    Corrin Parker, M.D.  [facility]  Medical Director [place] Director [facility] Cardio-P[facility] Department

## 2015-04-01 NOTE — Progress Notes (Signed)
[facility] Progress Note Patient Name: James [name] DOB: 1965/11/02 MRN: [id number]   Date of Service  04/01/2015  HPI/Events of Note  Camera check on patient with BiPAP after ICU transfer. Hemodynamically stable. Wakes up to voice & nods head appropriately. Hemodynamically stable.  eICU Interventions  Continue current plan of care.     Intervention Category Intermediate Interventions: Respiratory distress - evaluation and management  Tera Partridge 04/01/2015, 8:56 PM

## 2015-04-01 NOTE — Evaluation (Signed)
Physical Therapy Evaluation Patient Details Name: James [name] MRN: [id number] DOB: Feb 01, 1966 Today's Date: 04/01/2015   History of Present Illness  Pt is admitted for acute respiratory insufficiency. Pt with complaints of cough and fever. Pt with history of muscular dystrophy, type 1 as well as GERD. Pt is also currently on 3L of O2 at this time. Sats at 88% at rest. Needed cues for pursed lip breathing to improve sats to WNL.  Clinical Impression  Pt is a pleasant 50 year old male who was admitted for acute respiratory insufficency. Pt performs bed mobility with mod. indep, transfers improving to cga with rw with practice reps, and ambulation with rw and cga. Pt demonstrates deficits with strength/balance/endurance. Pt performed all mobility on 3L of O2 with sats WNL and HR increased to 122bpm. 5 time sit<>Stand performed demonstrating decreased power and balance. Pt is close to baseline level, however would recommend use of rw in home environment to improve balance. Would benefit from skilled PT to address above deficits and promote optimal return to PLOF.     Follow Up Recommendations Home health PT    Equipment Recommendations  Rolling walker with 5" wheels    Recommendations for Other Services       Precautions / Restrictions Precautions Precautions: Fall Restrictions Weight Bearing Restrictions: No      Mobility  Bed Mobility Overal bed mobility: Modified Independent             General bed mobility comments: bed mobility performed with mod I, using bed rails for assistance. Once seated at EOB, pt able to sit with independence.  Transfers Overall transfer level: Needs assistance Equipment used: Rolling walker (2 wheeled) Transfers: Sit to/from Stand Sit to Stand: Min assist;Mod assist         General transfer comment: transfers performed with rw and cues for pushing from seated surface. Once standing, pt with post. leaning while in standing. Transfers performed  multiple times with transfers improving, only requiring cga.   Ambulation/Gait Ambulation/Gait assistance: Min guard Ambulation Distance (Feet): 100 Feet Assistive device: Rolling walker (2 wheeled) Gait Pattern/deviations: Step-through pattern     General Gait Details: ambulated using rw secondary to post leaning. Pt able to use rw correctly with cues. No LOB noted. Pt fatigues with increased ambulation, however O2 sats remained WNL.   Stairs            Wheelchair Mobility    Modified Rankin (Stroke Patients Only)       Balance Overall balance assessment: Needs assistance Sitting-balance support: Feet supported Sitting balance-Leahy Scale: Good     Standing balance support: Bilateral upper extremity supported Standing balance-Leahy Scale: Fair Standing balance comment: slight post leaning noted, however correctable with cues for bringing belly closer to rw.                             Pertinent Vitals/Pain Pain Assessment: No/denies pain    Home Living Family/patient expects to be discharged to:: Private residence Living Arrangements: Spouse/significant other Available Help at Discharge: Family Type of Home: House Home Access: Stairs to enter Entrance Stairs-Rails:  (1 rail) Technical brewer of Steps: 5 Home Layout: One level Home Equipment: [place] - single point;Cane - quad      Prior Function Level of Independence: Independent         Comments: no history of falls     Hand Dominance        Extremity/Trunk Assessment  Upper Extremity Assessment: Overall WFL for tasks assessed           Lower Extremity Assessment: Generalized weakness (grossly 4/5)         Communication   Communication: No difficulties  Cognition Arousal/Alertness: Awake/alert Behavior During Therapy: WFL for tasks assessed/performed Overall Cognitive Status: Within Functional Limits for tasks assessed                      General Comments       Exercises Other Exercises Other Exercises: Pt completed 5 time sit->stand in 26 seconds with minimal hand support on rw.       Assessment/Plan    PT Assessment Patient needs continued PT services  PT Diagnosis Difficulty walking;Abnormality of gait   PT Problem List Decreased strength;Decreased activity tolerance;Decreased balance;Decreased mobility  PT Treatment Interventions Gait training;Therapeutic exercise   PT Goals (Current goals can be found in the Care Plan section) Acute Rehab PT Goals Patient Stated Goal: to go home PT Goal Formulation: With patient Time For Goal Achievement: 04/15/15 Potential to Achieve Goals: Good    Frequency Min 2X/week   Barriers to discharge        Co-evaluation               End of Session Equipment Utilized During Treatment: Gait belt;Oxygen Activity Tolerance: Patient tolerated treatment well Patient left: in chair;with chair alarm set Nurse Communication: Mobility status         Time: [id number] PT Time Calculation (min) (ACUTE ONLY): 31 min   Charges:   PT Evaluation $PT Eval Moderate Complexity: 1 Procedure PT Treatments $Therapeutic Exercise: 8-22 mins   PT G Codes:        James [name] 04-30-2015, 1:09 PM  James [name], PT, DPT [phone]

## 2015-04-01 NOTE — Plan of Care (Signed)
Paged dr b/c pt has become increasingly lethargic and xanax was given before lunch.  Pt vomits after getting meds. Lung sounds very congested.  Chest xray showed worsening  PNA.   Scopolamine patch placed and scheduled zofran.  Called respiratory to evaluate.  Dr. Vonzella Nipple ABGs.

## 2015-04-01 NOTE — Care Management Important Message (Signed)
Important Message  Patient Details  Name: James [name] MRN: [id number] Date of Birth: 1965/07/06   Medicare Important Message Given:  Yes    [name] 04/01/2015, 9:53 AM

## 2015-04-01 NOTE — Care Management (Signed)
Admitted to [facility] with the diagnosis of acute respiratory insufficiency. Lives with wife, Danette [phone]). Home Health, physical therapy, and home oxygen thru [place] in the past. Last seen Dr. Caryl Bis December 29th. No home oxygen in the home now. No falls in the last 4-5 months. Good appetite. No Life Alert. Information given to wife about Life Alert. Mr. Leard feeds and dresses himself, but needs help with shower. Uses a cane to aid in ambulation. Does drive, if needed. Wife works during the day, but 61 year old son is in the home.  Physical therapy evaluation completed. Recommends homehealth and physical. Ms. Colletta indicated she would like [facility] again. Shelbie Ammons RN MSN CCM Care Management [phone]

## 2015-04-01 NOTE — Progress Notes (Signed)
[facility] at [place] NAME: James [name]    MR#:  [id number]  DATE OF BIRTH:  1965-04-21  SUBJECTIVE:  CHIEF COMPLAINT:   Chief Complaint  Patient presents with  . Cough  . Fever   - Significant cough and copious amounts of saliva with sputum constantly. -Worsening cough resulting in vomiting episodes. Didn't sleep at all last night. -Chest x-ray with worsening pneumonia  REVIEW OF SYSTEMS:  Review of Systems  Constitutional: Negative for fever and chills.  Eyes: Negative for blurred vision.  Respiratory: Positive for cough, sputum production and shortness of breath. Negative for wheezing.   Cardiovascular: Negative for chest pain and palpitations.  Gastrointestinal: Positive for vomiting. Negative for nausea, abdominal pain, diarrhea and constipation.  Genitourinary: Negative for dysuria.  Musculoskeletal: Positive for myalgias.  Neurological: Positive for weakness. Negative for dizziness, tremors, sensory change, speech change, seizures and headaches.  Psychiatric/Behavioral: Negative for depression.    DRUG ALLERGIES:   Allergies  Allergen Reactions  . Codeine Nausea Only    VITALS:  Blood pressure 100/68, pulse 92, temperature 98.5 F (36.9 C), temperature source Oral, resp. rate 20, height 5\' 11"  (1.803 m), weight 86.546 kg (190 lb 12.8 oz), SpO2 94 %.  PHYSICAL EXAMINATION:  Physical Exam  GENERAL:  50 y.o.-year-old patient lying in the bed with no acute distress. Constant spitting noted. EYES: Pupils equal, round, reactive to light and accommodation. No scleral icterus. Extraocular muscles intact.  HEENT: Head atraumatic, normocephalic. Oropharynx and nasopharynx clear.  NECK:  Supple, no jugular venous distention. No thyroid enlargement, no tenderness.  LUNGS: Diffuse rhonchi at the bases, left greater than right. Moving air bilaterally otherwise. No wheezing crackles or rales.. No use of accessory muscles of  respiration.  CARDIOVASCULAR: S1, S2 normal. No  rubs, or gallops. 3/6 systolic murmur is present ABDOMEN: Soft, nontender, nondistended. Bowel sounds present. No organomegaly or mass.  EXTREMITIES: No pedal edema, cyanosis, or clubbing.  NEUROLOGIC: Cranial nerves II through XII are intact. Muscle strength 5/5 in all extremities. Sensation intact. Gait not checked. Generalized weakness is present PSYCHIATRIC: The patient is alert and oriented x 3.  SKIN: No obvious rash, lesion, or ulcer.    LABORATORY PANEL:   CBC  Recent Labs Lab 04/01/15 0509  WBC 9.6  HGB 14.6  HCT 44.2  PLT 172   ------------------------------------------------------------------------------------------------------------------  Chemistries   Recent Labs Lab 04/01/15 0509  NA 143  K 3.4*  CL 107  CO2 30  GLUCOSE 114*  BUN 19  CREATININE 0.94  CALCIUM 8.5*   ------------------------------------------------------------------------------------------------------------------  Cardiac Enzymes No results for input(s): TROPONINI in the last 168 hours. ------------------------------------------------------------------------------------------------------------------  RADIOLOGY:  Dg Chest 2 View  04/01/2015  CLINICAL DATA:  Shortness of breath, cough EXAM: CHEST  2 VIEW COMPARISON:  03/30/2015 FINDINGS: Cardiomediastinal silhouette is stable. Study is limited by poor inspiration. Persistent linear atelectasis or infiltrate in lingula. Worsening atelectasis or infiltrate in left lower lobe. No pulmonary edema. IMPRESSION: Persistent linear atelectasis or infiltrate in lingula. Worsening atelectasis or infiltrate in left lower lobe. No pulmonary edema. Electronically Signed   By: Lahoma Crocker M.D.   On: 04/01/2015 12:20   Dg Chest 2 View  03/30/2015  CLINICAL DATA:  Productive cough. EXAM: CHEST  2 VIEW COMPARISON:  April 15, 2014. FINDINGS: The heart size and mediastinal contours are within normal limits. No  pneumothorax or pleural effusion is noted. Right lung is clear. Probable minimal subsegmental atelectasis is noted laterally in  the left midlung. The visualized skeletal structures are unremarkable. IMPRESSION: Probable minimal subsegmental atelectasis seen laterally in left midlung. Electronically Signed   By: Marijo Conception, M.D.   On: 03/30/2015 16:12    EKG:   Orders placed or performed in visit on 01/23/14  . 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  #1 acute hypoxic respiratory failure-secondary to pneumonia as noted on x-ray in the left lower lobe. -Difficulty coughing due to weakness from his myotonic dystrophy. -Added cough medicines. Also scopolamine patch for increased secretions. Continue supplemental oxygen. - Incentive spirometry -Continue duonebs and also antibiotics-Rocephin and azithromycin. - f/u repeat CXR  #2 GERD-on Protonix  #3 myotonic dystrophy-physical therapy. Ambulates at baseline without any assistance. Has a walker and also cane.  #4 DVT prophylaxis-on subcutaneous heparin  #5 Hypokalemia- being replaced  Physical therapy consult prior to discharge. Recommended home health   All the records are reviewed and case discussed with Care Management/Social Workerr. Management plans discussed with the patient, family and they are in agreement.  CODE STATUS: Full Code  TOTAL TIME TAKING CARE OF THIS PATIENT: 37 minutes.   POSSIBLE D/C TOMORROW, DEPENDING ON CLINICAL CONDITION.   Gladstone Lighter M.D on 04/01/2015 at 2:58 PM  Between 7am to 6pm - Pager - [phone]  After 6pm go to [email or web address] - password [email or web address] [facility]  [place] [facility]  Office  [phone]  CC: Primary care physician; [name], MD

## 2015-04-01 NOTE — Progress Notes (Signed)
Called by nurse as patient became lethargic. Has been sleeping for almost 3 hours. On exam lungs very rhonchorous. He did receive a dose of Xanax this morning. Chest x-ray this morning revealed worsening of his pneumonia. ABG stat ordered which revealed hypercarbia and hypoxia with slight acidosis. -We'll transfer to ICU, BiPAP ordered. Discussed with on-call intensivist. -A dose of flumazenil has been ordered. Xanax and tussinex are discontinued. -Scopolamine patch that was started for increased secretions has also been pulled off.  Additional critical care time spent: 31min

## 2015-04-02 DIAGNOSIS — G7111 Myotonic muscular dystrophy: Secondary | ICD-10-CM

## 2015-04-02 LAB — BLOOD GAS, ARTERIAL
ACID-BASE EXCESS: 7.5 mmol/L — AB (ref 0.0–3.0)
ALLENS TEST (PASS/FAIL): POSITIVE — AB
Acid-Base Excess: 4.5 mmol/L — ABNORMAL HIGH (ref 0.0–3.0)
Allens test (pass/fail): POSITIVE — AB
Bicarbonate: 33 mEq/L — ABNORMAL HIGH (ref 21.0–28.0)
Bicarbonate: 34.9 mEq/L — ABNORMAL HIGH (ref 21.0–28.0)
FIO2: 0.28
FIO2: 0.32
O2 SAT: 77.7 %
O2 Saturation: 86.9 %
PATIENT TEMPERATURE: 37
PCO2 ART: 59 mmHg — AB (ref 32.0–48.0)
PO2 ART: 46 mmHg — AB (ref 83.0–108.0)
Patient temperature: 37
pCO2 arterial: 64 mmHg — ABNORMAL HIGH (ref 32.0–48.0)
pH, Arterial: 7.32 — ABNORMAL LOW (ref 7.350–7.450)
pH, Arterial: 7.38 (ref 7.350–7.450)
pO2, Arterial: 54 mmHg — ABNORMAL LOW (ref 83.0–108.0)

## 2015-04-02 MED ORDER — ALBUTEROL SULFATE (2.5 MG/3ML) 0.083% IN NEBU: 2.5000 mg | INHALATION_SOLUTION | RESPIRATORY_TRACT | Status: DC | PRN

## 2015-04-02 MED ORDER — ALBUTEROL SULFATE (2.5 MG/3ML) 0.083% IN NEBU
2.5000 mg | INHALATION_SOLUTION | Freq: Four times a day (QID) | RESPIRATORY_TRACT | Status: DC
  Administered 2015-04-02 – 2015-04-07 (×19): 2.5 mg via RESPIRATORY_TRACT
  Filled 2015-04-02 (×21): qty 3

## 2015-04-02 MED ORDER — KCL IN DEXTROSE-NACL 40-5-0.45 MEQ/L-%-% IV SOLN
INTRAVENOUS | Status: DC
  Administered 2015-04-02 – 2015-04-07 (×5): via INTRAVENOUS
  Filled 2015-04-02 (×8): qty 1000

## 2015-04-02 NOTE — Progress Notes (Addendum)
Off BiPAP this AM without overt distress and complaints of dyspnea. Strong cough (rattling). Wet quality to voice  Filed Vitals:   04/02/15 1000 04/02/15 1100 04/02/15 1116 04/02/15 1200  BP: 98/71 102/82  120/98  Pulse: 88 92  102  Temp:    98.6 F (37 C)  TempSrc:    Oral  Resp: 17 16  17   Height:      Weight:      SpO2: 93% 91% 94% 92%   NCAT, expressionless facies No JVD Diffuse bilateral rhonchi Reg, no M NABS No C/C/E  BMP Latest Ref Rng 04/01/2015 03/30/2015 04/15/2014  Glucose 65 - 99 mg/dL 114(H) 136(H) 128(H)  BUN 6 - 20 mg/dL 19 10 11   Creatinine 0.61 - 1.24 mg/dL 0.94 1.03 1.22  Sodium 135 - 145 mmol/L 143 140 141  Potassium 3.5 - 5.1 mmol/L 3.4(L) 3.5 4.1  Chloride 101 - 111 mmol/L 107 107 104  CO2 22 - 32 mmol/L 30 27 30   Calcium 8.9 - 10.3 mg/dL 8.5(L) 8.8(L) 8.9    CBC Latest Ref Rng 04/01/2015 03/30/2015 04/15/2014  WBC 3.8 - 10.6 K/uL 9.6 7.5 6.7  Hemoglobin 13.0 - 18.0 g/dL 14.6 16.9 17.5  Hematocrit 40.0 - 52.0 % 44.2 49.0 52.2(H)  Platelets 150 - 440 K/uL 172 167 194   Results for orders placed or performed during the hospital encounter of 03/30/15  MRSA PCR Screening     Status: None   Collection Time: 04/01/15  5:32 PM  Result Value Ref Range Status   MRSA by PCR NEGATIVE NEGATIVE Final    Comment:        The GeneXpert MRSA Assay (FDA approved for NASAL specimens only), is one component of a comprehensive MRSA colonization surveillance program. It is not intended to diagnose MRSA infection nor to guide or monitor treatment for MRSA infections.    Anti-infectives    Start     Dose/Rate Route Frequency Ordered Stop   04/01/15 1930  piperacillin-tazobactam (ZOSYN) IVPB 3.375 g     3.375 g 12.5 mL/hr over 240 Minutes Intravenous 3 times per day 04/01/15 1907     04/01/15 1500  azithromycin (ZITHROMAX) tablet 500 mg  Status:  Discontinued     500 mg Oral Daily 04/01/15 1445 04/01/15 1854   03/31/15 2100  azithromycin (ZITHROMAX) 500 mg in dextrose  5 % 250 mL IVPB  Status:  Discontinued     500 mg 250 mL/hr over 60 Minutes Intravenous Every 24 hours 03/30/15 2041 04/01/15 1445   03/31/15 1800  cefTRIAXone (ROCEPHIN) 1 g in dextrose 5 % 50 mL IVPB  Status:  Discontinued     1 g 100 mL/hr over 30 Minutes Intravenous Every 24 hours 03/31/15 1708 04/01/15 1854   03/30/15 2015  azithromycin (ZITHROMAX) 500 mg in dextrose 5 % 250 mL IVPB     500 mg 250 mL/hr over 60 Minutes Intravenous  Once 03/30/15 2003 03/30/15 2231      CXR: low lung volumes, patchy bilateral atx/infiltrate  IMPRESSION:  Moderately advanced myotonic dystrophy Acute hypoxic respiratory failure - likely aspiration after episode of vomiting   PLAN/REC: Cont PRN BiPAP Cont nebulized steroids and bronchodilators for now Cont pip-tazo for now Keep NPO for now until risk of intubation subsides Will obtain swallow eval in 01/06 Airway hygiene with chest percussion vest  Merton Border, MD PCCM service Mobile [phone] Pager [phone]

## 2015-04-02 NOTE — Progress Notes (Signed)
[facility] at [place] NAME: James [name]    MR#:  [id number]  DATE OF BIRTH:  [birth year]  SUBJECTIVE:  CHIEF COMPLAINT:   Chief Complaint  Patient presents with  . Cough  . Fever   Moved to ICU overnight due to respiratory distress. Was on BiPAP briefly now back on room air. Concern for aspiration.  REVIEW OF SYSTEMS:  Review of Systems  Constitutional: Negative for fever and chills.  Eyes: Negative for blurred vision.  Respiratory: Positive for cough, sputum production and shortness of breath. Negative for wheezing.   Cardiovascular: Negative for chest pain and palpitations.  Gastrointestinal: Positive for vomiting. Negative for nausea, abdominal pain, diarrhea and constipation.  Genitourinary: Negative for dysuria.  Musculoskeletal: Positive for myalgias.  Neurological: Positive for weakness. Negative for dizziness, tremors, sensory change, speech change, seizures and headaches.  Psychiatric/Behavioral: Negative for depression.    DRUG ALLERGIES:   Allergies  Allergen Reactions  . Codeine Nausea Only    VITALS:  Blood pressure 110/79, pulse 82, temperature 98.1 F (36.7 C), temperature source Axillary, resp. rate 18, height 5\' 11"  (1.803 m), weight 88.9 kg (195 lb 15.8 oz), SpO2 91 %.  PHYSICAL EXAMINATION:  Physical Exam  GENERAL:  50 y.o.-year-old patient lying in the bed with no acute distress. Weak and critically ill-appearing. EYES: Pupils equal, round, reactive to light and accommodation. No scleral icterus. Extraocular muscles intact.  HEENT: Head atraumatic, normocephalic. Oropharynx and nasopharynx clear.  NECK:  Supple, no jugular venous distention. No thyroid enlargement, no tenderness.  LUNGS: Diffuse rhonchi at the bases, left greater than right. Poor air movement. No wheezing crackles or rales.. No use of accessory muscles of respiration.  CARDIOVASCULAR: S1, S2 normal. No  rubs, or gallops. 3/6 systolic  murmur is present ABDOMEN: Soft, nontender, nondistended. Bowel sounds present. No organomegaly or mass.  EXTREMITIES: No pedal edema, cyanosis, or clubbing.  NEUROLOGIC: Cranial nerves II through XII are intact. Muscle strength 4/5 in all extremities. Sensation intact. Gait not checked. Generalized weakness is present PSYCHIATRIC: The patient is groggy but oriented when awake SKIN: No obvious rash, lesion, or ulcer.    LABORATORY PANEL:   CBC  Recent Labs Lab 04/01/15 0509  WBC 9.6  HGB 14.6  HCT 44.2  PLT 172   ------------------------------------------------------------------------------------------------------------------  Chemistries   Recent Labs Lab 04/01/15 0509  NA 143  K 3.4*  CL 107  CO2 30  GLUCOSE 114*  BUN 19  CREATININE 0.94  CALCIUM 8.5*   ------------------------------------------------------------------------------------------------------------------  Cardiac Enzymes No results for input(s): TROPONINI in the last 168 hours. ------------------------------------------------------------------------------------------------------------------  RADIOLOGY:  Dg Chest 2 View  04/01/2015  CLINICAL DATA:  Shortness of breath, cough EXAM: CHEST  2 VIEW COMPARISON:  03/30/2015 FINDINGS: Cardiomediastinal silhouette is stable. Study is limited by poor inspiration. Persistent linear atelectasis or infiltrate in lingula. Worsening atelectasis or infiltrate in left lower lobe. No pulmonary edema. IMPRESSION: Persistent linear atelectasis or infiltrate in lingula. Worsening atelectasis or infiltrate in left lower lobe. No pulmonary edema. Electronically Signed   By: Lahoma Crocker M.D.   On: 04/01/2015 12:20    EKG:   Orders placed or performed in visit on 01/23/14  . 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  #1 acute hypoxic respiratory failure-secondary to pneumonia as noted on  x-ray in the left lower lobe. - The ICU overnight due to respiratory distress.  Briefly on BiPAP now on nasal cannula - Swallow study planned for tomorrow -Difficulty coughing due to weakness from his myotonic dystrophy. -Added cough medicines. Also scopolamine patch for increased secretions. Continue supplemental oxygen. - Incentive spirometry -Continue duonebs and also antibiotics-Rocephin and azithromycin. - Appreciate pulmonology following  #2 GERD-on Protonix  #3 myotonic dystrophy-physical therapy. Ambulates at baseline without any assistance. Has a walker and also cane.  #4 DVT prophylaxis-on subcutaneous heparin  #5 Hypokalemia- being replaced  Physical therapy recommendation for home health physical therapy.  I have discussed the care plan with the patient, his father and his wife. 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 Palliative care will discuss goals of care tomorrow   All the records are reviewed and case discussed with Care Management/Social Workerr. Management plans discussed with the patient, family and they are in agreement.  CODE STATUS: Full Code  TOTAL TIME TAKING CARE OF THIS PATIENT: 37 minutes.   POSSIBLE D/C TOMORROW, DEPENDING ON CLINICAL CONDITION.   Myrtis Ser M.D on 04/02/2015 at 5:37 PM  Between 7am to 6pm - Pager - [phone]  After 6pm go to [email or web address] - password [email or web address] [facility]  [place] [facility]  Office  [phone]  CC: Primary care physician; [name], MD

## 2015-04-02 NOTE — Care Management (Signed)
Patient transferred to icu 1/4 for respiratory distress.History of myotonic dystrophy type I.  He was transferred due to the need for continuous bipap.  He has had a progressive decline in his health and having difficulty with breathing while eating. Patient is NPO.  Will discuss during progression-? swallowing eval needed?  There is concern that patient may require intubation and if so most likely will require trach and peg.  Palliative care consult may be of benefit to follow.

## 2015-04-02 NOTE — Progress Notes (Signed)
RN spoke with Dr. Volanda Napoleon on the phone and made MD aware that patient was unable to void when trying and that RN bladder scanned patient and the result was >550. MD gave order to insert foley catheter.

## 2015-04-02 NOTE — Progress Notes (Signed)
   04/02/15 1100  Clinical Encounter Type  Visited With Patient and family together  Visit Type Initial  Referral From Physician  Consult/Referral To Chaplain  Spiritual Encounters  Spiritual Needs Emotional  Stress Factors  Patient Stress Factors Health changes  Family Stress Factors Health changes  Chaplain rounded in the unit and doctor asked me to check in with patient and family. Got an update from the nurse and met with family. Offered a compassionate presence, support, and blessings. Family asked to be kept in prayer and thanked for me for the visit. Chaplain Sendy Pluta A. Retha Bither Ext. [phone]

## 2015-04-02 NOTE — Care Management (Signed)
Dr Alva Garnet will place palliative care consult for 04/03/2015

## 2015-04-02 NOTE — Progress Notes (Signed)
PT Cancellation Note  Patient Details Name: James [name] MRN: [id number] DOB: 1965/03/29   Cancelled Treatment:    Reason Eval/Treat Not Completed: Other (comment). Pt moved to CCU secondary to respiratory status. Will need new orders to resume therapy.   Suzi Hernan 04/02/2015, 8:02 AM  [name], PT, DPT [phone]

## 2015-04-02 NOTE — Progress Notes (Signed)
Pt. Awake alert and oriented asking to remove BiPAP mask. Pt. Placed on O2 via 3LPM Nasal cannula for SpO2 93%. RR and HR stable. Will continue to reassess work of breathing.

## 2015-04-03 DIAGNOSIS — J189 Pneumonia, unspecified organism: Secondary | ICD-10-CM

## 2015-04-03 DIAGNOSIS — R1310 Dysphagia, unspecified: Secondary | ICD-10-CM

## 2015-04-03 DIAGNOSIS — J9601 Acute respiratory failure with hypoxia: Secondary | ICD-10-CM

## 2015-04-03 DIAGNOSIS — Z515 Encounter for palliative care: Secondary | ICD-10-CM

## 2015-04-03 LAB — CBC
HCT: 39.4 % — ABNORMAL LOW (ref 40.0–52.0)
Hemoglobin: 13.3 g/dL (ref 13.0–18.0)
MCH: 31.7 pg (ref 26.0–34.0)
MCHC: 33.8 g/dL (ref 32.0–36.0)
MCV: 94 fL (ref 80.0–100.0)
PLATELETS: 158 10*3/uL (ref 150–440)
RBC: 4.19 MIL/uL — ABNORMAL LOW (ref 4.40–5.90)
RDW: 14.2 % (ref 11.5–14.5)
WBC: 7.8 10*3/uL (ref 3.8–10.6)

## 2015-04-03 LAB — BASIC METABOLIC PANEL
Anion gap: 3 — ABNORMAL LOW (ref 5–15)
BUN: 10 mg/dL (ref 6–20)
CALCIUM: 8.6 mg/dL — AB (ref 8.9–10.3)
CO2: 34 mmol/L — AB (ref 22–32)
Chloride: 105 mmol/L (ref 101–111)
Creatinine, Ser: 0.92 mg/dL (ref 0.61–1.24)
GFR calc Af Amer: >60 mL/min (ref >60)
GLUCOSE: 122 mg/dL — AB (ref 65–99)
Potassium: 3.8 mmol/L (ref 3.5–5.1)
Sodium: 142 mmol/L (ref 135–145)

## 2015-04-03 MED ORDER — AMOXICILLIN-POT CLAVULANATE 875-125 MG PO TABS
1.0000 | ORAL_TABLET | Freq: Two times a day (BID) | ORAL | Status: DC
  Administered 2015-04-03 – 2015-04-07 (×9): 1 via ORAL
  Filled 2015-04-03 (×10): qty 1

## 2015-04-03 MED ORDER — PANTOPRAZOLE SODIUM 40 MG PO TBEC
40.0000 mg | DELAYED_RELEASE_TABLET | Freq: Every day | ORAL | Status: DC
  Administered 2015-04-03 – 2015-04-07 (×5): 40 mg via ORAL
  Filled 2015-04-03 (×5): qty 1

## 2015-04-03 NOTE — Progress Notes (Signed)
No distress. Has remained off BiPAP since yesterday. Strong rattling cough. No new complaints  Filed Vitals:   04/03/15 1100 04/03/15 1200 04/03/15 1300 04/03/15 1400  BP: 104/64 104/70 95/64 100/65  Pulse: 92 116 74 80  Temp: 99.9 F (37.7 C)     TempSrc: Oral     Resp: 15 16 18 19   Height:      Weight:      SpO2: 94% 96% 97% 97%   NAD, cognition intact NCAT No JVD Diffuse bilateral rhonchi Reg, no M NABS No C/C/E  BMP Latest Ref Rng 04/03/2015 04/01/2015 03/30/2015  Glucose 65 - 99 mg/dL 122(H) 114(H) 136(H)  BUN 6 - 20 mg/dL 10 19 10   Creatinine 0.61 - 1.24 mg/dL 0.92 0.94 1.03  Sodium 135 - 145 mmol/L 142 143 140  Potassium 3.5 - 5.1 mmol/L 3.8 3.4(L) 3.5  Chloride 101 - 111 mmol/L 105 107 107  CO2 22 - 32 mmol/L 34(H) 30 27  Calcium 8.9 - 10.3 mg/dL 8.6(L) 8.5(L) 8.8(L)    CBC Latest Ref Rng 04/03/2015 04/01/2015 03/30/2015  WBC 3.8 - 10.6 K/uL 7.8 9.6 7.5  Hemoglobin 13.0 - 18.0 g/dL 13.3 14.6 16.9  Hematocrit 40.0 - 52.0 % 39.4(L) 44.2 49.0  Platelets 150 - 440 K/uL 158 172 167   Results for orders placed or performed during the hospital encounter of 03/30/15  MRSA PCR Screening     Status: None   Collection Time: 04/01/15  5:32 PM  Result Value Ref Range Status   MRSA by PCR NEGATIVE NEGATIVE Final    Comment:        The GeneXpert MRSA Assay (FDA approved for NASAL specimens only), is one component of a comprehensive MRSA colonization surveillance program. It is not intended to diagnose MRSA infection nor to guide or monitor treatment for MRSA infections.    Anti-infectives    Start     Dose/Rate Route Frequency Ordered Stop   04/03/15 1100  amoxicillin-clavulanate (AUGMENTIN) 875-125 MG per tablet 1 tablet     1 tablet Oral 2 times daily 04/03/15 1048     04/01/15 1930  piperacillin-tazobactam (ZOSYN) IVPB 3.375 g  Status:  Discontinued     3.375 g 12.5 mL/hr over 240 Minutes Intravenous 3 times per day 04/01/15 1907 04/03/15 1048   04/01/15 1500   azithromycin (ZITHROMAX) tablet 500 mg  Status:  Discontinued     500 mg Oral Daily 04/01/15 1445 04/01/15 1854   03/31/15 2100  azithromycin (ZITHROMAX) 500 mg in dextrose 5 % 250 mL IVPB  Status:  Discontinued     500 mg 250 mL/hr over 60 Minutes Intravenous Every 24 hours 03/30/15 2041 04/01/15 1445   03/31/15 1800  cefTRIAXone (ROCEPHIN) 1 g in dextrose 5 % 50 mL IVPB  Status:  Discontinued     1 g 100 mL/hr over 30 Minutes Intravenous Every 24 hours 03/31/15 1708 04/01/15 1854   03/30/15 2015  azithromycin (ZITHROMAX) 500 mg in dextrose 5 % 250 mL IVPB     500 mg 250 mL/hr over 60 Minutes Intravenous  Once 03/30/15 2003 03/30/15 2231      CXR: NNF  IMPRESSION:  Moderately advanced myotonic dystrophy Acute hypoxic respiratory failure - likely aspiration after episode of vomiting  Atelectasis and mucus retention  PLAN/REC: Cont PRN BiPAP Cont nebulized steroids and bronchodilators for now Change pip-tazo to augmentin and complete 10 days SLP swallow eval 01/06 PT eval and rx requested 01/06 Palliative Care to see today to continue discussion  re: goals of care Cont chest percussion Transfer to med - surg floor Recheck CXR in AM 01/07  Merton Border, MD PCCM service Mobile [phone] Pager [phone]

## 2015-04-03 NOTE — Plan of Care (Signed)
Problem: Activity: Goal: Ability to tolerate increased activity will improve Outcome: Progressing Up in chair x 3 hrs today tolerated activity well.  Needs minimal help once standing. Uses walker at home  Problem: Education: Goal: Knowledge of disease or condition will improve Outcome: Progressing Aware Muscular Dyst  may be playing role in possible aspiration PNA.  Seen by SLT for swallow eval  Problem: Health Behavior/Discharge Planning: Goal: Ability to manage health-related needs will improve Outcome: Progressing Family and pt working with palliative care on deciding progression of further care needs/ wishes as Muscular Dstrophy progresses. See note  Problem: Physical Regulation: Goal: Diagnostic test results will improve Outcome: Progressing Sats good on 5L/[place].  Has not worn BIPAP in more than 24 hrs Goal: Ability to maintain a body temperature in the normal range will improve Outcome: Progressing T max 99.8 oral  Problem: Respiratory: Goal: Respiratory status will improve Outcome: Progressing Good sats on 5L/[place].  No dyspnea.  Lungs diminished throughout Goal: Ability to maintain a clear airway will improve Outcome: Not Progressing Still with wet voice and drool at times.  Spits up clear thick secretions in green bag. No nausea Goal: Pain level will decrease Outcome: Progressing No c/o pain Goal: Identification of resources available to assist in meeting health care needs will improve Outcome: Progressing Supportive family/wife  Problem: Pain Management: Goal: Expressions of feelings of enhanced comfort will increase Outcome: Progressing Flat affect at times. More conversive this afternoon.

## 2015-04-03 NOTE — Evaluation (Signed)
Physical Therapy Re-Evaluation Patient Details Name: James [name] MRN: [id number] DOB: 1965/06/09 Today's Date: 04/03/2015   History of Present Illness  Pt admitted for acute respiratory insufficiency. Pt with complaints of cough and fever. Pt with history of muscular dystrophy, type 1 as well as GERD. Pt on 3L of O2 with sats at 88% at rest at initial eval (needed cues for pursed lip breathing to improve sats to WNL).  Pt transferred to CCU 04/01/15 secondary to respiratory distress and lethargy (acute hypercapnic respiratory failure).  Pt now on 5 L O2 with O2 sats 96% at rest.  PT consult received for re-eval.  Clinical Impression  Prior to admission, pt was independent without AD.  Pt lives with his wife in 1 level home with 5 steps to enter with railing.  Currently pt is SBA supine to sit with HOB elevated; CGA with transfers with RW; and CGA x2 (for safety and lines) with RW taking a few steps bed to recliner.  [name] O2 sats >95% on 5 L/min O2 via nasal cannula with activity but pt's HR increased from 78 bpm (at rest) to 116 bpm with activity limiting activity (pt's HR decreased back to 80's within a couple minutes of rest in chair).  Pt would benefit from skilled PT to address noted impairments and functional limitations.  Recommend pt discharge to home with support of family and HHPT when medically appropriate.    Follow Up Recommendations Home health PT;Supervision for mobility/OOB    Equipment Recommendations  Rolling walker with 5" wheels    Recommendations for Other Services       Precautions / Restrictions Precautions Precautions: Fall Precaution Comments: Monitor O2 Restrictions Weight Bearing Restrictions: No      Mobility  Bed Mobility Overal bed mobility: Needs Assistance Bed Mobility: Supine to Sit     Supine to sit: Supervision;HOB elevated     General bed mobility comments: increased time to perform; assist for lines  Transfers Overall transfer level: Needs  assistance Equipment used: Rolling walker (2 wheeled) Transfers: Sit to/from Stand Sit to Stand: Min guard;From elevated surface ((bed height elevated compared to normal beds but in lowest position))         General transfer comment: increased effort to stand but steady without loss of balance  Ambulation/Gait Ambulation/Gait assistance: Min guard;+2 safety/equipment Ambulation Distance (Feet): 3 Feet (bed to recliner) Assistive device: Rolling walker (2 wheeled)   Gait velocity: decreased   General Gait Details: decreased B step length/foot clearance/heelstrike; increased effort and time to perform but steady  Financial trader Rankin (Stroke Patients Only)       Balance Overall balance assessment: Needs assistance Sitting-balance support: Bilateral upper extremity supported;Feet supported Sitting balance-Leahy Scale: Good     Standing balance support: Bilateral upper extremity supported (on RW) Standing balance-Leahy Scale: Fair                               Pertinent Vitals/Pain Pain Assessment: No/denies pain    Home Living Family/patient expects to be discharged to:: Private residence Living Arrangements: Spouse/significant other Available Help at Discharge: Family Type of Home: House Home Access: Stairs to enter Entrance Stairs-Rails:  (1 rail) Technical brewer of Steps: 5 Home Layout: One level Home Equipment: [place] - single point;Cane - quad      Prior Function Level of Independence: Independent  Comments: no history of falls     Hand Dominance        Extremity/Trunk Assessment   Upper Extremity Assessment: Generalized weakness           Lower Extremity Assessment: Generalized weakness         Communication   Communication: No difficulties  Cognition Arousal/Alertness: Awake/alert Behavior During Therapy: WFL for tasks assessed/performed Overall Cognitive Status:  Within Functional Limits for tasks assessed                      General Comments   Nursing cleared pt for participation in physical therapy.  Pt agreeable to PT session.  [name] wife and dad present during session.    Exercises        Assessment/Plan    PT Assessment Patient needs continued PT services  PT Diagnosis Difficulty walking;Generalized weakness   PT Problem List Decreased strength;Decreased activity tolerance;Decreased balance;Decreased mobility;Cardiopulmonary status limiting activity  PT Treatment Interventions Gait training;Stair training;Functional mobility training;Therapeutic activities;Therapeutic exercise;Balance training;Neuromuscular re-education;Patient/family education   PT Goals (Current goals can be found in the Care Plan section) Acute Rehab PT Goals Patient Stated Goal: to go home PT Goal Formulation: With patient Time For Goal Achievement: 04/17/15 Potential to Achieve Goals: Good    Frequency Min 2X/week   Barriers to discharge        Co-evaluation               End of Session Equipment Utilized During Treatment: Gait belt;Oxygen Activity Tolerance: Patient tolerated treatment well Patient left: in chair;with call bell/phone within reach;with family/visitor present (Pt educated not to get up on his own (pt's family reports they will not be leaving room); nursing ok'd pt to be up in chair without alarm) Nurse Communication: Mobility status;Precautions         Time: [id number] PT Time Calculation (min) (ACUTE ONLY): 24 min   Charges:   PT Evaluation $PT Re-evaluation: 1 Procedure     PT G CodesLeitha Bleak 2015/04/18, 12:27 PM Leitha Bleak, [place]

## 2015-04-03 NOTE — Evaluation (Signed)
Clinical/Bedside Swallow Evaluation Patient Details  Name: James [name] MRN: [id number] Date of Birth: 27-Jun-1965  Today's Date: 04/03/2015 Time: SLP Start Time (ACUTE ONLY): 1100 SLP Stop Time (ACUTE ONLY): 1200 SLP Time Calculation (min) (ACUTE ONLY): 60 min  Past Medical History:  Past Medical History  Diagnosis Date  . GERD (gastroesophageal reflux disease)   . History of chicken pox   . Myotonic dystrophy, type 1 ([place])     followed by [facility]  . History of pneumonia 2013    double PNA at [facility]  . LBBB (left bundle branch block)   . Cardiomyopathy [facility])    Past Surgical History:  Past Surgical History  Procedure Laterality Date  . US echocardiography  09/2010    EF 50%, mld LVH and dysfunction, mild RV dysfunction and enlarged   HPI:  Pt is a 50 y.o. male with a known history of myotonic dystrophy type I, GERD, and pneumonia presenting with shortness of breath and cough. Wife described inconsistent s/s of dysphagia at home but he tolerates "smoothies" she makes and takes his meds w/ water at home. Pt had episodes of vomiting after getting meds ~2 days ago. Lung sounds very congested following w/ increased secretions. Suspect this could be related to his presentation of increased secretions currently. Pt was given meds to dry secretions.    Assessment / Plan / Recommendation Clinical Impression  Pt appears at increased risk for aspiration sec. to his declined respiratory and medical status at this time; suspect also related to his decreased management of his own (increased) native secretions/phlegm. Pt initially appeared to demo adequate oral phase management of boluses then transfer for swallowing. Pt swallowed w/out immediate overt s/s of aspiration noted; clear vocal quality following trials and no decline in O2 sats or respiratory status. However, as trials increased, pt exhibited increased expectoration which turned into regurgitation of phlegm and mucous. Pt appears unsafe for po's  at this time as he is at increased risk for aspiration of mucous and phlegm buildup as well as d/t aspiration of po foods/liquids. MD consulted and agreed w/ continuing w/ po trials next 1-2 days; MD will consider NG TFs for support as pt has been NPO. Palliative Care consult in place; MD updated also. NSG and family/pt updated. Rec. oral care frequently during the day while NPO.    Aspiration Risk  Moderate aspiration risk    Diet Recommendation  NPO status at this time w/ oral care; ice chips if ok per MD  Medication Administration: Whole meds with puree (if ok per MD)    Other  Recommendations Recommended Consults: Consider GI evaluation Oral Care Recommendations: Oral care QID;Staff/trained caregiver to provide oral care   Follow up Recommendations   (TBD)    Frequency and Duration min 3x week  1 week       Prognosis Prognosis for Safe Diet Advancement: Guarded Barriers to Reach Goals: Severity of deficits (medical status)      Swallow Study   General Date of Onset: 03/30/15 HPI: Pt is a 50 y.o. male with a known history of myotonic dystrophy type I, GERD, and pneumonia presenting with shortness of breath and cough. Wife described inconsistent s/s of dysphagia at home but he tolerates "smoothies" she makes and takes his meds w/ water at home. Pt had episodes of vomiting after getting meds ~2 days ago. Lung sounds very congested following w/ increased secretions. Suspect this could be related to his presentation of increased secretions currently. Pt was given meds  to dry secretions.  Type of Study: Bedside Swallow Evaluation Previous Swallow Assessment: MBSS ~2 years ago; no significant deficts then Diet Prior to this Study: Regular;Thin liquids Temperature Spikes Noted: No (wbc 9.6, 7.8) Respiratory Status: Nasal cannula (5 liters) History of Recent Intubation: No (bipap) Behavior/Cognition: Alert;Cooperative;Pleasant mood;Requires cueing (cognitive decline?) Oral Cavity  Assessment: Within Functional Limits Oral Care Completed by SLP: Yes Oral Cavity - Dentition: Adequate natural dentition Vision: Functional for self-feeding Self-Feeding Abilities: Able to feed self;Needs assist;Needs set up Patient Positioning: Upright in bed Baseline Vocal Quality: Normal;Low vocal intensity Volitional Cough:  (fair) Volitional Swallow: Able to elicit    Oral/Motor/Sensory Function Overall Oral Motor/Sensory Function: Within functional limits (fair)   Ice Chips Ice chips: Within functional limits Presentation: Spoon (fed; 6 trials) Other Comments: cough at end did not appear related to the po trials   Thin Liquid Thin Liquid: Impaired Presentation: Cup;Self Fed (5 trials) Oral Phase Impairments:  (none) Pharyngeal  Phase Impairments: Cough - Delayed (x1; but did not appear related to the po trials)    Nectar Thick Nectar Thick Liquid: Not tested   Honey Thick Honey Thick Liquid: Not tested   Puree Puree: Impaired Presentation: Spoon (fed; 3 trials) Oral Phase Impairments:  (none) Pharyngeal Phase Impairments: Cough - Delayed (x1; did not appear directly related to the po trials)   Solid   GO    Solid: Impaired Presentation: Spoon (fed; 4 trials) Oral Phase Impairments:  (none) Pharyngeal Phase Impairments: Cough - Delayed (did not appear directly related to the po trials) Other Comments: pt continuously expectorated increased phlegm which then turned into an increased amount of regurgitation       Orinda Kenner, MS, CCC-SLP  Watson,Katherine 04/03/2015,3:02 PM

## 2015-04-03 NOTE — Progress Notes (Signed)
Key Points: Use following P&T approved IV to PO antibiotic change policy.  Description contains the criteria that are approved Note: Policy Excludes:  Esophagectomy patientsPHARMACIST - PHYSICIAN COMMUNICATION DR:   Volanda Napoleon CONCERNING: IV to Oral Route Change Policy  RECOMMENDATION: This patient is receiving pantoprazole by the intravenous route.  Based on criteria approved by the Pharmacy and Therapeutics Committee, the intravenous medication(s) is/are being converted to the equivalent oral dose form(s).   DESCRIPTION: These criteria include:  The patient is eating (either orally or via tube) and/or has been taking other orally administered medications for a least 24 hours  The patient has no evidence of active gastrointestinal bleeding or impaired GI absorption (gastrectomy, short bowel, patient on TNA or NPO).  If you have questions about this conversion, please contact the Pharmacy Department  []   [phone] )  [facility] [x]   [phone] )  [facility] []   [phone] )  [facility] []   [phone] )  [facility] []   [phone] )  [name], [facility] 04/03/2015 12:09 PM

## 2015-04-03 NOTE — Progress Notes (Signed)
[place], [place].   04/03/2015  Patient: James [name]   Date of Birth:  1965/07/11  Date of admission:  03/30/2015  Date of Discharge    To Whom it May Concern:   James [name]  has been admitted at [facility] from 03/30/2015 through today (04/03/2015). It is anticipated that he will be admitted for several additional days. Please excuse his wife from work as she has been integral in assisting with his care  If you have any questions or concerns, please don't hesitate to call.  Sincerely,   Myrtis Ser M.D Pager Number- [place] : [phone]   .

## 2015-04-03 NOTE — Progress Notes (Signed)
Palliative Care Update  I have spoken with pt's wife bedside.   Pts father was present but did not converse during our talk.  Pt himself was too sleepy to discuss issues brought up today.  We briefly went over the following issues that pt/ family needs to talk about before these situations arise:      ---- trache and long term vent           (can go better in pts with neurodegenerative disease than in chronic lung disease pts)     ---- long term / permanent feeding tube     -----institutional living / chronic hospitalizations     -----combinations of the above   For now, he is NPO b/c he had a lot of phlegm, secretions, and vomit in his esophagus (that came up) during SLP eval.  SLP stated that pt swallowed 'ok' but had the problem with fluid apparently sitting in his esophagus. His cough is reasonably vigorous. He needs more time to determine if this problem will resolve and if he will return to swallowing with his baseline mild dysphagia.   For now, nothing will change in terms of directives because the patient himself was not alert enough to participate in the discussion.  I will return on Monday and hope that he can talk about these matters on that date.    (full note to follow)  Colleen Can, MD Palliative Care

## 2015-04-03 NOTE — Progress Notes (Signed)
[place] at [place] NAME: James [name]    MR#:  [id number]  DATE OF BIRTH:  March 01, 1966  SUBJECTIVE:  CHIEF COMPLAINT:   Chief Complaint  Patient presents with  . Cough  . Fever   Improved. Now on nasal cannula. Sitting up conversing comfortably. Does appear weak  REVIEW OF SYSTEMS:  Review of Systems  Constitutional: Negative for fever and chills.  Eyes: Negative for blurred vision.  Respiratory: Positive for cough, sputum production and shortness of breath. Negative for wheezing.   Cardiovascular: Negative for chest pain and palpitations.  Gastrointestinal: Positive for vomiting. Negative for nausea, abdominal pain, diarrhea and constipation.  Genitourinary: Negative for dysuria.  Musculoskeletal: Positive for myalgias.  Neurological: Positive for weakness. Negative for dizziness, tremors, sensory change, speech change, seizures and headaches.  Psychiatric/Behavioral: Negative for depression.    DRUG ALLERGIES:   Allergies  Allergen Reactions  . Codeine Nausea Only    VITALS:  Blood pressure 100/65, pulse 80, temperature 99.9 F (37.7 C), temperature source Oral, resp. rate 19, height 5\' 11"  (1.803 m), weight 88.9 kg (195 lb 15.8 oz), SpO2 97 %.  PHYSICAL EXAMINATION:  Physical Exam  GENERAL:  50 y.o.-year-old patient lying in the bed with no acute distress. Weak EYES: Pupils equal, round, reactive to light and accommodation. No scleral icterus. Extraocular muscles intact.  HEENT: Head atraumatic, normocephalic. Oropharynx and nasopharynx clear.  NECK:  Supple, no jugular venous distention. No thyroid enlargement, no tenderness.  LUNGS: Diffuse rhonchi at the bases, left greater than right. Poor air movement. No wheezing crackles or rales.. No use of accessory muscles of respiration.  CARDIOVASCULAR: S1, S2 normal. No  rubs, or gallops. 3/6 systolic murmur is present ABDOMEN: Soft, nontender, nondistended. Bowel  sounds present. No organomegaly or mass.  EXTREMITIES: No pedal edema, cyanosis, or clubbing.  NEUROLOGIC: Cranial nerves II through XII are intact. Muscle strength 4/5 in all extremities. Sensation intact. Gait not checked. Generalized weakness is present PSYCHIATRIC: Alert, oriented 3, calm SKIN: No obvious rash, lesion, or ulcer.    LABORATORY PANEL:   CBC  Recent Labs Lab 04/03/15 0415  WBC 7.8  HGB 13.3  HCT 39.4*  PLT 158   ------------------------------------------------------------------------------------------------------------------  Chemistries   Recent Labs Lab 04/03/15 0415  NA 142  K 3.8  CL 105  CO2 34*  GLUCOSE 122*  BUN 10  CREATININE 0.92  CALCIUM 8.6*   ------------------------------------------------------------------------------------------------------------------  Cardiac Enzymes No results for input(s): TROPONINI in the last 168 hours. ------------------------------------------------------------------------------------------------------------------  RADIOLOGY:  No results found.  EKG:   Orders placed or performed in visit on 01/23/14  . 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  #1 acute hypoxic respiratory failure-secondary to pneumonia as noted on x-ray in the left lower lobe. - The ICU overnight due to respiratory distress. Briefly on BiPAP now on nasal cannula. Okay to transfer to [place] - Speech therapy evaluation results and concern for aspiration or esophageal dysfunction. Will get gastroenterology consultation. -Continue scopolamine patch for secretions. Continue supplemental oxygen. - Incentive spirometry -Continue duonebs and also antibiotics-Rocephin and azithromycin. - Appreciate pulmonology following  #2 GERD-on Protonix  #3 myotonic dystrophy-physical therapy. Ambulates at baseline without any assistance. Has a walker and also  cane.  #4 DVT prophylaxis-on subcutaneous heparin  #5 Hypokalemia- being replaced  I have discussed the care plan with the patient, his father and his wife. His  wife is  realistic about the poor prognosis expected with chronic progressive myotonic dystrophy. Palliative care to discuss goals of care  All the records are reviewed and case discussed with Care Management/Social Workerr. Management plans discussed with the patient, family and they are in agreement.  CODE STATUS: Full Code  TOTAL TIME TAKING CARE OF THIS PATIENT: 37 minutes.   POSSIBLE D/C TOMORROW, DEPENDING ON CLINICAL CONDITION.   Myrtis Ser M.D on 04/03/2015 at 4:39 PM  Between 7am to 6pm - Pager - [phone]  After 6pm go to [email or web address] - password [email or web address] [facility]  [facility]  Office  [phone]  CC: Primary care physician; [name], MD

## 2015-04-03 NOTE — Progress Notes (Signed)
Prepare for transfer to room 115. Report called to RN

## 2015-04-03 NOTE — Consult Note (Signed)
Palliative Medicine Inpatient Consult Note   Name: James [name] Date: 04/03/2015 MRN: [id number]  DOB: 1965/07/19  Referring Physician: Aldean Jewett, MD  Palliative Care consult requested for this 50 y.o. male for goals of medical therapy in patient with myotonic dystophy and problems associated with progression of this condition.    TODAY'S DISCUSSIONS AND DECISIONS: I have spoken with pt's wife bedside. Pts father was present but did not converse during our talk. Pt himself was too sleepy to discuss issues brought up today.  We briefly went over the following issues that pt/ family needs to talk about before these situations arise:   ---- trache and long term vent   (can go better in pts with neurodegenerative disease than in chronic lung disease pts)  ---- long term / permanent feeding tube  -----institutional living / chronic hospitalizations  -----combinations of the above   For now, he is NPO b/c he had a lot of phlegm, secretions, and vomit in his esophagus (that came up) during SLP eval. SLP stated that pt swallowed 'ok' but had the problem with fluid apparently sitting in his esophagus. His cough is reasonably vigorous. He needs more time to determine if this problem will resolve and if he will return to swallowing with his baseline mild dysphagia.   For now, nothing will change in terms of directives because the patient himself was not alert enough to participate in the discussion. I will return on Monday and hope that he can talk about these matters on that date.    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  SOCIAL HISTORY:  reports that he has never smoked. He has never used smokeless tobacco. He reports that he does not drink alcohol or use illicit drugs.  LEGAL DOCUMENTS:    CODE STATUS: Full code  PAST MEDICAL HISTORY: Past Medical History  Diagnosis Date  . GERD (gastroesophageal reflux disease)   . History of chicken pox   . Myotonic dystrophy, type 1 ([place])     followed by [facility]  . History of pneumonia 2013    double PNA at [facility]  . LBBB (left bundle branch block)   . Cardiomyopathy ([place])     PAST SURGICAL HISTORY:  Past Surgical History  Procedure Laterality Date  . US echocardiography  09/2010    EF 50%, mld LVH and dysfunction, mild RV dysfunction and enlarged    ALLERGIES:  is allergic to codeine.  MEDICATIONS:  Current Facility-Administered Medications  Medication Dose Route Frequency Provider Last Rate Last Dose  . acetaminophen (TYLENOL) suppository 650 mg  650 mg Rectal Q6H PRN Lytle Butte, MD      . albuterol (PROVENTIL) (2.5 MG/3ML)  0.083% nebulizer solution 2.5 mg  2.5 mg Nebulization Q6H Wilhelmina Mcardle, MD   2.5 mg at 04/03/15 1334  . albuterol (PROVENTIL) (2.5 MG/3ML) 0.083% nebulizer solution 2.5 mg  2.5 mg Nebulization Q3H PRN Wilhelmina Mcardle, MD      . amoxicillin-clavulanate (AUGMENTIN) 875-125 MG per tablet 1 tablet  1 tablet Oral BID Wilhelmina Mcardle, MD   1 tablet at 04/03/15 1223  . budesonide (PULMICORT) nebulizer solution 0.5 mg  0.5 mg Nebulization BID Flora Lipps,  MD   0.5 mg at 04/03/15 0738  . dextrose 5 % and 0.45 % NaCl with KCl 40 mEq/L infusion   Intravenous Continuous Wilhelmina Mcardle, MD 50 mL/hr at 04/03/15 1048    . docusate sodium (COLACE) capsule 100 mg  100 mg Oral Daily Lytle Butte, MD   100 mg at 04/03/15 0926  . enoxaparin (LOVENOX) injection 40 mg  40 mg Subcutaneous Q24H Gladstone Lighter, MD   40 mg at 04/02/15 2132  . morphine 2 MG/ML injection 1 mg  1 mg Intravenous Q1H PRN Flora Lipps, MD      . ondansetron (ZOFRAN) tablet 4 mg  4 mg Oral Q6H PRN Lytle Butte, MD       Or  . ondansetron [facility]) injection 4 mg  4 mg Intravenous Q6H PRN Lytle Butte, MD   4 mg at 04/01/15 0920  . pantoprazole (PROTONIX) EC tablet 40 mg  40 mg Oral Daily Aldean Jewett, MD        Vital Signs: BP 100/65 mmHg  Pulse 80  Temp(Src) 99.9 F (37.7 C) (Oral)  Resp 19  Ht 5\' 11"  (1.803 m)  Wt 88.9 kg (195 lb 15.8 oz)  BMI 27.35 kg/m2  SpO2 97% Filed Weights   03/30/15 1506 03/30/15 2147 04/01/15 1731  Weight: 90.719 kg (200 lb) 86.546 kg (190 lb 12.8 oz) 88.9 kg (195 lb 15.8 oz)    Estimated body mass index is 27.35 kg/(m^2) as calculated from the following:   Height as of this encounter: 5\' 11"  (1.803 m).   Weight as of this encounter: 88.9 kg (195 lb 15.8 oz).  PERFORMANCE STATUS (ECOG) : 3 - Symptomatic, >50% confined to bed  PHYSICAL EXAM: Very lethargic / sleeping Opens eyes but goes back to sleep and is not involving himself in discussions No JVD or TM Hrt rrr no m Lungs with ronchi no rales ant Abd soft and NT Ext no cyanosis or mottling  LABS: CBC:    Component Value Date/Time   WBC 7.8 04/03/2015 0415   WBC 6.7 04/15/2014 0752   HGB 13.3 04/03/2015 0415   HGB 17.5 04/15/2014 0752   HCT 39.4* 04/03/2015 0415   HCT 52.2* 04/15/2014 0752   PLT 158 04/03/2015 0415   PLT 194 04/15/2014 0752   MCV 94.0 04/03/2015 0415   MCV 95 04/15/2014 0752   NEUTROABS 5.8 03/30/2015 1518   NEUTROABS 3.4 10/08/2013 0348   LYMPHSABS  0.9* 03/30/2015 1518   LYMPHSABS 1.4 10/08/2013 0348   MONOABS 0.7 03/30/2015 1518   MONOABS 0.5 10/08/2013 0348   EOSABS 0.0 03/30/2015 1518   EOSABS 0.2 10/08/2013 0348   BASOSABS 0.0 03/30/2015 1518   BASOSABS 0.0 10/08/2013 0348   Comprehensive Metabolic Panel:    Component Value Date/Time   NA 142 04/03/2015 0415   NA 141 04/15/2014 0752   K 3.8 04/03/2015 0415   K 4.1 04/15/2014 0752   CL 105 04/03/2015 0415   CL  104 04/15/2014 0752   CO2 34* 04/03/2015 0415   CO2 30 04/15/2014 0752   BUN 10 04/03/2015 0415   BUN 11 04/15/2014 0752   CREATININE 0.92 04/03/2015 0415   CREATININE 1.22 04/15/2014 0752   GLUCOSE 122* 04/03/2015 0415   GLUCOSE 128* 04/15/2014 0752   CALCIUM 8.6* 04/03/2015 0415   CALCIUM 8.9 04/15/2014 0752   AST 23 04/15/2014 0752   ALT 52 04/15/2014 0752   ALKPHOS 87 04/15/2014 0752   BILITOT 2.7* 04/15/2014 0752   PROT 7.7 04/15/2014 0752   ALBUMIN 3.7 04/15/2014 0752      More than 50% of the visit was spent in counseling/coordination of care: Yes  Time Spent: 55 minutes

## 2015-04-04 ENCOUNTER — Inpatient Hospital Stay: Payer: Commercial Managed Care - HMO

## 2015-04-04 DIAGNOSIS — R1310 Dysphagia, unspecified: Secondary | ICD-10-CM | POA: Insufficient documentation

## 2015-04-04 LAB — CBC
HCT: 39.4 % — ABNORMAL LOW (ref 40.0–52.0)
HEMOGLOBIN: 13.4 g/dL (ref 13.0–18.0)
MCH: 32.2 pg (ref 26.0–34.0)
MCHC: 34 g/dL (ref 32.0–36.0)
MCV: 94.9 fL (ref 80.0–100.0)
PLATELETS: 177 10*3/uL (ref 150–440)
RBC: 4.15 MIL/uL — AB (ref 4.40–5.90)
RDW: 14 % (ref 11.5–14.5)
WBC: 7.7 10*3/uL (ref 3.8–10.6)

## 2015-04-04 LAB — BASIC METABOLIC PANEL
Anion gap: 6 (ref 5–15)
BUN: 8 mg/dL (ref 6–20)
CHLORIDE: 108 mmol/L (ref 101–111)
CO2: 29 mmol/L (ref 22–32)
CREATININE: 0.71 mg/dL (ref 0.61–1.24)
Calcium: 8.7 mg/dL — ABNORMAL LOW (ref 8.9–10.3)
Glucose, Bld: 99 mg/dL (ref 65–99)
Potassium: 4.2 mmol/L (ref 3.5–5.1)
SODIUM: 143 mmol/L (ref 135–145)

## 2015-04-04 NOTE — Progress Notes (Signed)
[facility] [place] NAME: James [name]    MR#:  [id number]  DATE OF BIRTH:  05/20/1965  SUBJECTIVE:  CHIEF COMPLAINT:   Chief Complaint  Patient presents with  . Cough  . Fever   Improved. Now on nasal cannula. Sitting up conversing comfortably. Does appear weak  REVIEW OF SYSTEMS:  Review of Systems  Constitutional: Negative for fever and chills.  Eyes: Negative for blurred vision.  Respiratory: Positive for cough, sputum production and shortness of breath. Negative for wheezing.   Cardiovascular: Negative for chest pain and palpitations.  Gastrointestinal: Positive for vomiting. Negative for nausea, abdominal pain, diarrhea and constipation.  Genitourinary: Negative for dysuria.  Musculoskeletal: Positive for myalgias.  Neurological: Positive for weakness. Negative for dizziness, tremors, sensory change, speech change, seizures and headaches.  Psychiatric/Behavioral: Negative for depression.    DRUG ALLERGIES:   Allergies  Allergen Reactions  . Codeine Nausea Only    VITALS:  Blood pressure 94/58, pulse 78, temperature 99 F (37.2 C), temperature source Oral, resp. rate 18, height 5\' 11"  (1.803 m), weight 88.9 kg (195 lb 15.8 oz), SpO2 93 %.  PHYSICAL EXAMINATION:  Physical Exam  GENERAL:  50 y.o.-year-old patient lying in the bed with no acute distress. Weak EYES: Pupils equal, round, reactive to light and accommodation. No scleral icterus. Extraocular muscles intact.  HEENT: Head atraumatic, normocephalic. Oropharynx and nasopharynx clear.  NECK:  Supple, no jugular venous distention. No thyroid enlargement, no tenderness.  LUNGS: Diffuse rhonchi at the bases, left greater than right. Poor air movement. No wheezing crackles or rales.. No use of accessory muscles of respiration.  CARDIOVASCULAR: S1, S2 normal. No  rubs, or gallops. 3/6 systolic murmur is present ABDOMEN: Soft, nontender, nondistended. Bowel sounds  present. No organomegaly or mass.  EXTREMITIES: No pedal edema, cyanosis, or clubbing.  NEUROLOGIC: Cranial nerves II through XII are intact. Muscle strength 4/5 in all extremities. Sensation intact. Gait not checked. Generalized weakness is present PSYCHIATRIC: Alert, oriented 3, calm SKIN: No obvious rash, lesion, or ulcer.    LABORATORY PANEL:   CBC  Recent Labs Lab 04/04/15 0536  WBC 7.7  HGB 13.4  HCT 39.4*  PLT 177   ------------------------------------------------------------------------------------------------------------------  Chemistries   Recent Labs Lab 04/04/15 0536  NA 143  K 4.2  CL 108  CO2 29  GLUCOSE 99  BUN 8  CREATININE 0.71  CALCIUM 8.7*   ------------------------------------------------------------------------------------------------------------------  Cardiac Enzymes No results for input(s): TROPONINI in the last 168 hours. ------------------------------------------------------------------------------------------------------------------  RADIOLOGY:  Dg Chest Port 1 View  04/04/2015  CLINICAL DATA:  Patient with respiratory failure. EXAM: PORTABLE CHEST 1 VIEW COMPARISON:  Chest radiograph 04/01/2015. FINDINGS: Monitoring leads overlie the patient. Stable low lung volumes. Stable cardiac and mediastinal contours. Unchanged left-greater-than-right mid lower lung heterogeneous opacities. Small left pleural effusion. IMPRESSION: Persistent low lung volumes with left-greater-than-right basilar opacities which may represent atelectasis or infection. Small left pleural effusion. Electronically Signed   By: Lovey Newcomer M.D.   On: 04/04/2015 07:09    EKG:   Orders placed or performed in visit on 01/23/14  . 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  #1 acute hypoxic respiratory failure-secondary to pneumonia as noted on x-ray in the left lower lobe. Dysphagia. -  The ICU overnight due to respiratory distress. Briefly on BiPAP now on nasal cannula.  - on augmentin now. - Speech  therapy evaluation results and concern for aspiration or esophageal dysfunction.    Appreciated gastroenterology consultation. -Continue scopolamine patch for secretions. Continue supplemental oxygen. - Incentive spirometry -Continue duonebs and also antibiotics-Rocephin and azithromycin. - Appreciate pulmonology following - I agree with GI- family and pt need to come to a decision about future planning about tube feeding, Ventilator and trach- before persuing any other testings or interventions, which will help [place] - which way to go from this point.   As he is very high risk for aspiration.- continue NPO for now- and keep IV fluids.  #2 GERD-on Protonix  #3 myotonic dystrophy-physical therapy. Ambulates at baseline without any assistance. Has a walker and also cane.  #4 DVT prophylaxis-on subcutaneous heparin  #5 Hypokalemia- being replaced  I have discussed the care plan with the patient, his father and his wife. 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 Palliative care to discuss goals of care  All the records are reviewed and case discussed with Care Management/Social Workerr. Management plans discussed with the patient, family and they are in agreement.  CODE STATUS: Full Code  TOTAL TIME TAKING CARE OF THIS PATIENT: 35 minutes.   POSSIBLE D/C 2-3 days, DEPENDING ON CLINICAL CONDITION.   Vaughan Basta M.D on 04/04/2015 at 11:27 AM  Between 7am to 6pm - Pager - [phone]  After 6pm go to [email or web address] - password [email or web address] [facility]  [facility]  Office  [phone]  CC: Primary care physician; [name], MD

## 2015-04-04 NOTE — Consult Note (Signed)
[facility]  [address] [place], [place] [address] Phone: [phone] Fax : [phone]  Consultation  Referring Provider:     No ref. provider found Primary Care Physician:  [name], MD Primary Gastroenterologist:  None          Reason for Consultation:     Dysphagia  Date of Admission:  03/30/2015 Date of Consultation:  04/04/2015         HPI:   James [name] is a 50 y.o. male who was admitted to the ICU for respiratory distress and acute respiratory failure with a history of myotonic dystrophy. The patient had 4 days of a productive cough with yellowish sputum. He also had emesis after coughing. The patient was treated in the ICU and then transferred up to the floor. The patient is still and respiratory distress and had a swallowing evaluation due to his report of emesis after eating. The patient's evaluation recommended a GI consultation and it appeared from their report that there impression suggested increased risk of aspiration due to his declining respiratory status. It was recommended that the patient was unsafe for by mouth set that time due to the increased risk of aspiration. She reports that it is worse now but he always has similar episodes even when he is not having pneumonia.  Past Medical History  Diagnosis Date  . GERD (gastroesophageal reflux disease)   . History of chicken pox   . Myotonic dystrophy, type 1 ([place])     followed by [facility]  . History of pneumonia 2013    double PNA at [facility]  . LBBB (left bundle branch block)   . Cardiomyopathy [facility])     Past Surgical History  Procedure Laterality Date  . US echocardiography  09/2010    EF 50%, mld LVH and dysfunction, mild RV dysfunction and enlarged    Prior to Admission medications   Medication Sig Start Date End Date Taking? Authorizing Provider  acetaminophen (TYLENOL) 325 MG tablet Take 650 mg by mouth every 6 (six) hours as needed.   Yes Historical Provider, MD  dextromethorphan  (DELSYM) 30 MG/5ML liquid Take 30 mg by mouth 2 (two) times daily as needed for cough.   Yes Historical Provider, MD  docusate sodium (COLACE) 50 MG capsule Take 50 mg by mouth as needed.    Yes Historical Provider, MD  guaiFENesin (MUCINEX) 600 MG 12 hr tablet Take 600 mg by mouth 2 (two) times daily.   Yes Historical Provider, MD  Multiple Vitamin (MULTIVITAMIN) tablet Take 1 tablet by mouth daily.   Yes Historical Provider, MD  omeprazole (PRILOSEC) 20 MG capsule Take by mouth daily.    Yes Historical Provider, MD    Family History  Problem Relation Age of Onset  . Hypertension Father   . Cancer Paternal Grandfather     lung, smoker  . Cancer Paternal Uncle     bone  . Diabetes Mother   . CAD Mother 17    MI  . Stroke Neg Hx      Social History  Substance Use Topics  . Smoking status: Never Smoker   . Smokeless tobacco: Never Used  . Alcohol Use: No    Allergies as of 03/30/2015 - Review Complete 03/30/2015  Allergen Reaction Noted  . Codeine Nausea Only 05/31/2013    Review of Systems:    All systems reviewed and negative except where noted in HPI.   Physical Exam:  Vital signs in last 24 hours: Temp:  [  98 F (36.7 C)-99.9 F (37.7 C)] 99 F (37.2 C) (01/07 [id number]) Pulse Rate:  [74-116] 78 (01/07 0638) Resp:  [15-23] 18 (01/07 [id number]) BP: (94-105)/(58-73) 94/58 mmHg (01/07 0638) SpO2:  [93 %-97 %] 93 % (01/07 0738) Last BM Date: 03/30/15 General:   Pleasant, cooperative in NAD Head:  Normocephalic and atraumatic. Eyes:   No icterus.   Conjunctiva pink. PERRLA. Ears:  Normal auditory acuity. Neck:  Supple; no masses or thyroidomegaly Lungs: Respirations even and unlabored. Lungs clear to auscultation bilaterally.   No wheezes, crackles, or rhonchi.  Heart:  Regular rate and rhythm;  Without murmur, clicks, rubs or gallops Abdomen:  Soft, nondistended, nontender. Normal bowel sounds. No appreciable masses or hepatomegaly.  No rebound or guarding.  Rectal:  Not  performed. Msk:  Symmetrical without gross deformities.  S  Extremities:  Without edema, cyanosis or clubbing. Neurologic:  Alert and oriented x3;  Skin:  Intact without significant lesions or rashes. Cervical Nodes:  No significant cervical adenopathy. Psych:  Alert and cooperative. Normal affect.  LAB RESULTS:  Recent Labs  04/03/15 0415 04/04/15 0536  WBC 7.8 7.7  HGB 13.3 13.4  HCT 39.4* 39.4*  PLT 158 177   BMET  Recent Labs  04/03/15 0415 04/04/15 0536  NA 142 143  K 3.8 4.2  CL 105 108  CO2 34* 29  GLUCOSE 122* 99  BUN 10 8  CREATININE 0.92 0.71  CALCIUM 8.6* 8.7*   LFT No results for input(s): PROT, ALBUMIN, AST, ALT, ALKPHOS, BILITOT, BILIDIR, IBILI in the last 72 hours. PT/INR No results for input(s): LABPROT, INR in the last 72 hours.  STUDIES: Dg Chest Port 1 View  04/04/2015  CLINICAL DATA:  Patient with respiratory failure. EXAM: PORTABLE CHEST 1 VIEW COMPARISON:  Chest radiograph 04/01/2015. FINDINGS: Monitoring leads overlie the patient. Stable low lung volumes. Stable cardiac and mediastinal contours. Unchanged left-greater-than-right mid lower lung heterogeneous opacities. Small left pleural effusion. IMPRESSION: Persistent low lung volumes with left-greater-than-right basilar opacities which may represent atelectasis or infection. Small left pleural effusion. Electronically Signed   By: Lovey Newcomer M.D.   On: 04/04/2015 07:09      Impression / Plan:   James [name] is a 50 y.o. y/o male with a history of myotonic dystrophy. Patient had a swallowing evaluation that suggested possible esophageal dysmotility because of the patient bringing up phlegm and mucus after eating. The patient's evaluation should include a upper GI series and possibly upper endoscopy. Due to the patient's respiratory status and upper endoscopy is not warranted or prudent at this time. With the patient's recommendation of being nothing by mouth because of increased aspiration risk a  barium swallow is also not recommended. The patient will have to decide whether a PEG tube may be necessary the future when his respiratory status is improved and should be set up for a barium swallow after his lung issues resolve. The patient and his family are also being seen by palliative care and may well need to, with the decision before any further investigation is undertaken. There is nothing further to do from a GI point of view during this admission less he needs a feeding tube. Reevaluating with speech pathology as his pneumonia resolves and his respiratory status improves would be recommended. I will be signing off at this time please contact me if you have any further questions.   Thank you for involving me in the care of this patient.      LOS: 5  days   Ollen Bowl, MD  04/04/2015, 10:30 AM   Note: This dictation was prepared with Dragon dictation along with smaller phrase technology. Any transcriptional errors that result from this process are unintentional.

## 2015-04-04 NOTE — Progress Notes (Addendum)
Speech Therapy Note: consulted MD who indicated it was best for pt to remain NPO for now w/ IV fluids. ST will f/u on Monday w/ po trials to determine pt's toleration of po's. Pt does have a palliative care consult. Rec. Oral care frequently during the day.

## 2015-04-05 MED ORDER — SCOPOLAMINE 1 MG/3DAYS TD PT72
1.0000 | MEDICATED_PATCH | TRANSDERMAL | Status: DC
  Administered 2015-04-05: 11:00:00 1.5 mg via TRANSDERMAL
  Filled 2015-04-05 (×2): qty 1

## 2015-04-05 NOTE — Progress Notes (Signed)
Patient ambulated a full lap around the nurses station with no complications. Madlyn Frankel, RN

## 2015-04-05 NOTE — Progress Notes (Signed)
[facility] at [place] NAME: James [name]    MR#:  [id number]  DATE OF BIRTH:  14-Jun-1965  SUBJECTIVE:  CHIEF COMPLAINT:   Chief Complaint  Patient presents with  . Cough  . Fever   Improved. Now on nasal cannula. Sitting up conversing comfortably. Does appear weak, wife in room.  REVIEW OF SYSTEMS:  Review of Systems  Constitutional: Negative for fever and chills.  Eyes: Negative for blurred vision.  Respiratory: Positive for cough, sputum production and shortness of breath. Negative for wheezing.   Cardiovascular: Negative for chest pain and palpitations.  Gastrointestinal: Negative for nausea, vomiting, abdominal pain, diarrhea and constipation.  Genitourinary: Negative for dysuria.  Musculoskeletal: Positive for myalgias.  Neurological: Positive for weakness. Negative for dizziness, tremors, sensory change, speech change, seizures and headaches.  Psychiatric/Behavioral: Negative for depression.    DRUG ALLERGIES:   Allergies  Allergen Reactions  . Codeine Nausea Only    VITALS:  Blood pressure 107/59, pulse 69, temperature 98.3 F (36.8 C), temperature source Oral, resp. rate 19, height 5\' 11"  (1.803 m), weight 88.9 kg (195 lb 15.8 oz), SpO2 92 %.  PHYSICAL EXAMINATION:  Physical Exam  GENERAL:  50 y.o.-year-old patient lying in the bed with no acute distress. Weak EYES: Pupils equal, round, reactive to light and accommodation. No scleral icterus. Extraocular muscles intact.  HEENT: Head atraumatic, normocephalic. Oropharynx and nasopharynx clear.  NECK:  Supple, no jugular venous distention. No thyroid enlargement, no tenderness.  LUNGS: Diffuse rhonchi at the bases, left greater than right. Poor air movement. No wheezing , mild crackles . No use of accessory muscles of respiration.  CARDIOVASCULAR: S1, S2 normal. No  rubs, or gallops. 3/6 systolic murmur is present ABDOMEN: Soft, nontender, nondistended. Bowel  sounds present. No organomegaly or mass.  EXTREMITIES: No pedal edema, cyanosis, or clubbing.  NEUROLOGIC: Cranial nerves II through XII are intact. Muscle strength 4/5 in all extremities. Sensation intact. Gait not checked. Generalized weakness is present PSYCHIATRIC: Alert, oriented 3, calm SKIN: No obvious rash, lesion, or ulcer.    LABORATORY PANEL:   CBC  Recent Labs Lab 04/04/15 0536  WBC 7.7  HGB 13.4  HCT 39.4*  PLT 177   ------------------------------------------------------------------------------------------------------------------  Chemistries   Recent Labs Lab 04/04/15 0536  NA 143  K 4.2  CL 108  CO2 29  GLUCOSE 99  BUN 8  CREATININE 0.71  CALCIUM 8.7*   ------------------------------------------------------------------------------------------------------------------  Cardiac Enzymes No results for input(s): TROPONINI in the last 168 hours. ------------------------------------------------------------------------------------------------------------------  RADIOLOGY:  Dg Chest Port 1 View  04/04/2015  CLINICAL DATA:  Patient with respiratory failure. EXAM: PORTABLE CHEST 1 VIEW COMPARISON:  Chest radiograph 04/01/2015. FINDINGS: Monitoring leads overlie the patient. Stable low lung volumes. Stable cardiac and mediastinal contours. Unchanged left-greater-than-right mid lower lung heterogeneous opacities. Small left pleural effusion. IMPRESSION: Persistent low lung volumes with left-greater-than-right basilar opacities which may represent atelectasis or infection. Small left pleural effusion. Electronically Signed   By: Lovey Newcomer M.D.   On: 04/04/2015 07:09    EKG:   Orders placed or performed in visit on 01/23/14  . 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  #1 acute hypoxic respiratory failure-secondary to pneumonia as noted on x-ray in the left lower lobe.  Dysphagia. - The ICU overnight due to respiratory distress. Briefly on BiPAP now on nasal cannula.  - on augmentin now. -  Speech therapy evaluation results and concern for aspiration or esophageal dysfunction.    Appreciated gastroenterology consultation. - Continue scopolamine patch for secretions. Continue supplemental oxygen. - Incentive spirometry -Continue duonebs and also antibiotics - Appreciate pulmonology following - I agree with GI- family and pt need to come to a decision about future planning about tube feeding, Ventilator and trach- before persuing any other testings or interventions, which will help [place] - which way to go from this point. Palliative care had brief discussion with pt and his wife on Friday, they will have further decision on Monday.   As he is very high risk for aspiration.- continue NPO for now- and keep IV fluids. - zofran to help with nausea.  #2 GERD-on Protonix  #3 myotonic dystrophy-physical therapy. Ambulates at baseline without any assistance. Has a walker and also cane.  #4 DVT prophylaxis-on subcutaneous heparin  #5 Hypokalemia- being replaced  I have discussed the care plan with the patient, his wife. 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 Palliative care to discuss goals of care  All the records are reviewed and case discussed with Care Management/Social Workerr. Management plans discussed with the patient, family and they are in agreement.  CODE STATUS: Full Code  TOTAL TIME TAKING CARE OF THIS PATIENT: 35 minutes.   POSSIBLE D/C 2-3 days, DEPENDING ON CLINICAL CONDITION.   Vaughan Basta M.D on 04/05/2015 at 8:17 AM  Between 7am to 6pm - Pager - [phone]  After 6pm go to [email or web address] - password [email or web address] [facility]  [facility]  Office  [phone]  CC: Primary care physician; [name], MD

## 2015-04-06 ENCOUNTER — Inpatient Hospital Stay: Payer: Commercial Managed Care - HMO

## 2015-04-06 NOTE — Progress Notes (Signed)
Physical Therapy Treatment Patient Details Name: James [name] MRN: [id number] DOB: Jan 25, 1966 Today's Date: 04/06/2015    History of Present Illness Pt admitted for acute respiratory insufficiency. Pt with complaints of cough and fever. Pt with history of muscular dystrophy, type 1 as well as GERD. Pt on 3L of O2 with sats at 88% at rest at initial eval (needed cues for pursed lip breathing to improve sats to WNL).  Pt transferred to CCU 04/01/15 secondary to respiratory distress and lethargy (acute hypercapnic respiratory failure).  Pt on 5 L O2 with O2 sats 96% at rest on PT re-eval 04/03/15.    PT Comments    [name] O2 90-91% at rest on 3 L/min supplemental O2 via nasal cannula and increased to 95% end of ambulation 200 feet with RW (nursing notified of vitals).  Pt appearing steady with RW and significant improvement in functional mobility since re-eval.  Will continue to progress pt with functional mobility with least restrictive AD, balance, and strengthening during hospital stay.   Follow Up Recommendations  Home health PT;Supervision for mobility/OOB     Equipment Recommendations  Rolling walker with 5" wheels    Recommendations for Other Services       Precautions / Restrictions Precautions Precautions: Fall Precaution Comments: Monitor O2; NPO Restrictions Weight Bearing Restrictions: No    Mobility  Bed Mobility Overal bed mobility: Needs Assistance Bed Mobility: Supine to Sit;Sit to Supine     Supine to sit: Supervision;HOB elevated x2 trials Sit to supine: Supervision;HOB elevated x2 trials      Transfers Overall transfer level: Needs assistance Equipment used: Rolling walker (2 wheeled) Transfers: Sit to/from Stand Sit to Stand: Min guard         General transfer comment: steady without loss of balance  Ambulation/Gait Ambulation/Gait assistance: Min guard;+2 safety/equipment (2nd assist for IV pole and O2 tank) Ambulation Distance (Feet): 200  Feet Assistive device: Rolling walker (2 wheeled)   Gait velocity: decreased   General Gait Details: decreased B step length/foot clearance/heelstrike; steady without loss of balance   Stairs            Wheelchair Mobility    Modified Rankin (Stroke Patients Only)       Balance Overall balance assessment: Needs assistance Sitting-balance support: Bilateral upper extremity supported;Feet supported Sitting balance-Leahy Scale: Good     Standing balance support: Bilateral upper extremity supported (on RW) Standing balance-Leahy Scale: Good                      Cognition Arousal/Alertness: Awake/alert Behavior During Therapy: WFL for tasks assessed/performed Overall Cognitive Status: Within Functional Limits for tasks assessed                      Exercises      General Comments   Nursing cleared pt for participation in physical therapy.  Pt agreeable to PT session.  [name] wife present during session.     Pertinent Vitals/Pain Pain Assessment: No/denies pain    Home Living                      Prior Function            PT Goals (current goals can now be found in the care plan section) Acute Rehab PT Goals Patient Stated Goal: to go home PT Goal Formulation: With patient Time For Goal Achievement: 04/17/15 Potential to Achieve Goals: Good Additional Goals Additional Goal #1: Pt  will be able to perform bed mobility/transfers with independence and no AD in order to improve functional independence Progress towards PT goals: Progressing toward goals    Frequency  Min 2X/week    PT Plan Current plan remains appropriate    Co-evaluation             End of Session Equipment Utilized During Treatment: Gait belt;Oxygen Activity Tolerance: Patient tolerated treatment well Patient left: in bed;with call bell/phone within reach;with bed alarm set;with family/visitor present     Time: 1535-1605 PT Time Calculation (min) (ACUTE  ONLY): 30 min  Charges:  $Gait Training: 8-22 mins $Therapeutic Activity: 8-22 mins                    G CodesLeitha Bleak April 08, 2015, 4:44 PM Leitha Bleak, [place]

## 2015-04-06 NOTE — Progress Notes (Signed)
Palliative Care Update   Met with pt and wife today.  He was alert enough to participate in discussions.  However, he waffles a bit in his comments and he is slow to comment.  His wife has spoken with him off and on over the weekend about the subjects I brought up on Friday with her. This enabled the pt to come to the following decisions:  1.  He desires to be DO NOT RESUSCITATE STATUS.  His uncle had a DNR portable form and he understands this issue quite clearly.  I have changed his code status to DNR and completed a portable DNR form for the pt and placed it in his paper chart.  2.  He does not want to live with a trache and ventilator.  He is OK with BIPAP when needed.  He asked some questions that led me to believe he could change his mind on this down the road, but he also commented that he does not think this would be the kind of quality of life he wants to live with, so he is, for now, making it known that he does not want this.  3.  Regarding feeding tube issue:  He and his wife feel that he needs another trial at swallowing.  I agree.  They both want to avoid a feeding tube if at all possible. BUT they are not against it since it could be that he might be able to be fed via tube sometimes and via mouth sometimes --until it becomes known that oral options are out forever definitively.        I have asked for SLP to re-evaluate the pt.  He is known to have dysphagia for 2-5 years but he is not as weak as he was on Friday and is not vomiting or spitting things up now and he wants food and liquids again.        Pt and wife will address feeding tube if he absolutely fails swallowing eval / MBSS and if outpt work up by GI indicates it is needed. They are inclined to want this done --- if it is known to be absolutely essential for his survival.       Dr Allen Norris, GI, recommends an upper GI eval and endoscopy as outpt to evaluate esophageal dysmotility --after he has improved from his pneumonia.    That  should all be ordered. But for now, pt is still NPO and it needs to be determined if he is able to tolerate any diet texture at all --so he can go home (eating something)  --and then have this work up done as an outptp.      Full note to follow. Kirby Funk, MD Palliative Care

## 2015-04-06 NOTE — Progress Notes (Signed)
[facility] at [place] NAME: James [name]    MR#:  [id number]  DATE OF BIRTH:  08-10-1965  SUBJECTIVE:  CHIEF COMPLAINT:   Chief Complaint  Patient presents with  . Cough  . Fever   Improved. Now on nasal cannula. Sitting up conversing comfortably. Does appear weak, wife in room. Sipping on ice chips.  REVIEW OF SYSTEMS:  Review of Systems  Constitutional: Negative for fever and chills.  Eyes: Negative for blurred vision.  Respiratory: Positive for cough, sputum production and shortness of breath. Negative for wheezing.   Cardiovascular: Negative for chest pain and palpitations.  Gastrointestinal: Negative for nausea, vomiting, abdominal pain, diarrhea and constipation.  Genitourinary: Negative for dysuria.  Musculoskeletal: Positive for myalgias.  Neurological: Positive for weakness. Negative for dizziness, tremors, sensory change, speech change, seizures and headaches.  Psychiatric/Behavioral: Negative for depression.    DRUG ALLERGIES:   Allergies  Allergen Reactions  . Codeine Nausea Only    VITALS:  Blood pressure 90/56, pulse 80, temperature 98.7 F (37.1 C), temperature source Oral, resp. rate 18, height 5\' 11"  (1.803 m), weight 88.9 kg (195 lb 15.8 oz), SpO2 91 %.  PHYSICAL EXAMINATION:  Physical Exam  GENERAL:  50 y.o.-year-old patient lying in the bed with no acute distress. Weak EYES: Pupils equal, round, reactive to light and accommodation. No scleral icterus. Extraocular muscles intact.  HEENT: Head atraumatic, normocephalic. Oropharynx and nasopharynx clear.  NECK:  Supple, no jugular venous distention. No thyroid enlargement, no tenderness.  LUNGS: mild rhonchi at the bases, left greater than right. Poor air movement. No wheezing , mild crackles . No use of accessory muscles of respiration.  CARDIOVASCULAR: S1, S2 normal. No  rubs, or gallops. 3/6 systolic murmur is present ABDOMEN: Soft, nontender,  nondistended. Bowel sounds present. No organomegaly or mass.  EXTREMITIES: No pedal edema, cyanosis, or clubbing.  NEUROLOGIC: Cranial nerves II through XII are intact. Muscle strength 4/5 in all extremities. Sensation intact. Gait not checked. Generalized weakness is present PSYCHIATRIC: Alert, oriented 3, calm SKIN: No obvious rash, lesion, or ulcer.    LABORATORY PANEL:   CBC  Recent Labs Lab 04/04/15 0536  WBC 7.7  HGB 13.4  HCT 39.4*  PLT 177   ------------------------------------------------------------------------------------------------------------------  Chemistries   Recent Labs Lab 04/04/15 0536  NA 143  K 4.2  CL 108  CO2 29  GLUCOSE 99  BUN 8  CREATININE 0.71  CALCIUM 8.7*   ------------------------------------------------------------------------------------------------------------------  Cardiac Enzymes No results for input(s): TROPONINI in the last 168 hours. ------------------------------------------------------------------------------------------------------------------  RADIOLOGY:  Dg Chest 2 View  04/06/2015  CLINICAL DATA:  Pneumonia EXAM: CHEST  2 VIEW COMPARISON:  Two days ago FINDINGS: Unchanged lung volumes with left basilar opacity including air bronchograms. Stable small left pleural effusion. Improving right basilar aeration with better defined diaphragm. Normal heart size and stable mediastinal contours. IMPRESSION: Unchanged retrocardiac atelectasis or pneumonia with small pleural effusion. Electronically Signed   By: Monte Fantasia M.D.   On: 04/06/2015 08:11    EKG:   Orders placed or performed in visit on 01/23/14  . 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  #1 acute hypoxic respiratory failure-secondary to pneumonia as noted on x-ray in the left lower lobe. Dysphagia. - required ICU due to respiratory distress. Briefly on BiPAP now on nasal  cannula.    May need oxygen on discharge. - on  augmentin now. - Speech therapy evaluation results and concern for aspiration or esophageal dysfunction.    Appreciated gastroenterology consultation. - Continue scopolamine patch for secretions. Continue supplemental oxygen. - Incentive spirometry -Continue duonebs and also antibiotics - zofran to help with nausea. - Pt and wife again had meeting with palliative care today- they agreed to have Swallow eval tomorrow again, and if he is high risk- then to send him for Feeding tube.  #2 GERD-on Protonix  #3 myotonic dystrophy-physical therapy. Ambulates at baseline without any assistance. Has a walker and also cane. Appreciated help by PT.  #4 DVT prophylaxis-on subcutaneous heparin  #5 Hypokalemia- being replaced  I have discussed the care plan with the patient, his wife. 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 Palliative care to discuss goals of care  All the records are reviewed and case discussed with Care Management/Social Workerr. Management plans discussed with the patient, family and they are in agreement.  CODE STATUS: Full Code  TOTAL TIME TAKING CARE OF THIS PATIENT: 35 minutes.   POSSIBLE D/C 2-3 days, DEPENDING ON CLINICAL CONDITION.   Vaughan Basta M.D on 04/06/2015 at 5:23 PM  Between 7am to 6pm - Pager - [phone]  After 6pm go to [email or web address] - password [email or web address] [facility]  [place] [facility]  Office  [phone]  CC: Primary care physician; [name], MD

## 2015-04-06 NOTE — Progress Notes (Signed)
PT Cancellation Note  Patient Details Name: James [name] MRN: [id number] DOB: 12-Apr-1965   Cancelled Treatment:    Reason Eval/Treat Not Completed: Other (comment) (Pt currently meeting with palliative care and not available for PT).  Will re-attempt PT treatment at a later date/time.   [name] 04/06/2015, 3:00 PM Leitha Bleak, [place]

## 2015-04-06 NOTE — Progress Notes (Signed)
Patient now on 3L-O2, sat 91%. PT eval this shift. Awaiting SLP eval. Family at bedside. Madlyn Frankel, RN

## 2015-04-06 NOTE — Care Management Important Message (Signed)
Important Message  Patient Details  Name: QUANTEL DRISKEL MRN: [id number] Date of Birth: 11-28-1965   Medicare Important Message Given:  Yes    Juliann Pulse A Brighton Pilley 04/06/2015, 11:32 AM

## 2015-04-06 NOTE — Plan of Care (Signed)
Problem: Safety: Goal: Ability to remain free from injury will improve Outcome: Progressing Pt is moderate falls, bed alarm in use, call bell and phone within reach, q2 hr safety rounds made.  Pt remains free of injury.  [name] family at bedside.    Problem: Pain Managment: Goal: General experience of comfort will improve Outcome: Progressing No c/o pain, pt resting comfortably in bed

## 2015-04-07 MED ORDER — ALBUTEROL SULFATE (2.5 MG/3ML) 0.083% IN NEBU: 2.5000 mg | INHALATION_SOLUTION | Freq: Four times a day (QID) | RESPIRATORY_TRACT | Status: DC | PRN

## 2015-04-07 NOTE — Progress Notes (Signed)
Initial Nutrition Assessment   INTERVENTION:   Meals and Snacks: Cater to patient preferences; SLP following Medical Food Supplement Therapy: noted pt with Magic Cup on breakfast tray this am   NUTRITION DIAGNOSIS:   Inadequate oral intake related to acute illness as evidenced by  (NPO for 5days during admission).  GOAL:   Patient will meet greater than or equal to 90% of their needs  MONITOR:    (Energy Intake, Digestive System, anthropometrics)  REASON FOR ASSESSMENT:   LOS    ASSESSMENT:   Pt admitted with fever and SOB with acute respiratory insufficiency and dysphagia secondary to pna. Pt with h/o myotonic dystrophy. RD notes likely to discharge today.  Past Medical History  Diagnosis Date  . GERD (gastroesophageal reflux disease)   . History of chicken pox   . Myotonic dystrophy, type 1 ([place])     followed by [facility]  . History of pneumonia 2013    double PNA at [facility]  . LBBB (left bundle branch block)   . Cardiomyopathy ([place])      Diet Order:  DIET DYS 3 Room service appropriate?: Yes; Fluid consistency:: Thin    Current Nutrition: Pt eating eggs and yogurt with granola on visit this am with bites of Magic Cup  Food/Nutrition-Related History: Pt with NPO for 5 days during in admission. Pt and pt's father reports appetite very good PTA. Pt reports some days eating only 2 meals per day, depending on wife's work schedule, sometimes doesn't eat lunch.   Scheduled Medications:  . albuterol  2.5 mg Nebulization Q6H  . amoxicillin-clavulanate  1 tablet Oral BID  . budesonide (PULMICORT) nebulizer solution  0.5 mg Nebulization BID  . docusate sodium  100 mg Oral Daily  . enoxaparin (LOVENOX) injection  40 mg Subcutaneous Q24H  . pantoprazole  40 mg Oral Daily  . scopolamine  1 patch Transdermal Q72H    Continuous Medications:  . dextrose 5 % and 0.45 % NaCl with KCl 40 mEq/L 50 mL/hr at 04/07/15 0207     Electrolyte/Renal Profile and Glucose Profile:    Recent Labs Lab 04/01/15 0509 04/03/15 0415 04/04/15 0536  NA 143 142 143  K 3.4* 3.8 4.2  CL 107 105 108  CO2 30 34* 29  BUN 19 10 8   CREATININE 0.94 0.92 0.71  CALCIUM 8.5* 8.6* 8.7*  GLUCOSE 114* 122* 99   Protein Profile: No results for input(s): ALBUMIN in the last 168 hours.  Gastrointestinal Profile: Last BM: 04/06/2015  Weight Change: Pt reports UBW of 200lbs. Last measured weight on 1/4 of 195lbs.   Height:   Ht Readings from Last 1 Encounters:  04/01/15 5\' 11"  (1.803 m)    Weight:   Wt Readings from Last 1 Encounters:  04/01/15 195 lb 15.8 oz (88.9 kg)    Wt Readings from Last 10 Encounters:  04/01/15 195 lb 15.8 oz (88.9 kg)  03/26/15 202 lb 3.2 oz (91.717 kg)  05/31/13 200 lb 4 oz (90.833 kg)     BMI:  Body mass index is 27.35 kg/(m^2).   EDUCATION NEEDS:   No education needs identified at this time   [place], RD, LDN Pager [phone] Weekend/On-Call Pager [phone]

## 2015-04-07 NOTE — Progress Notes (Signed)
Pt d/c home via wheelchair escorted by staff and auxillary and family

## 2015-04-07 NOTE — Progress Notes (Signed)
Palliative Medicine Inpatient Consult Follow Up Note   Name: James [name] Date: 04/07/2015 MRN: [id number]  DOB: [birth year]  Referring Physician: No att. providers found  Palliative Care consult requested for this 50 y.o. male for goals of medical therapy in patient with myotonic dystrophy and dysphagia.   TODAY'S DISCUSSIONS AND DECISIONS  Pt did OK with SLP eval today. No overt s/s of esophageal dysfunction.  Can have dysphagia 3 with thins.   Pt is going home today.  Wife is a bit hesitant about all of this, but this is partly due to the fact that she wasn't here earlier when care manager was talking to pt and pt does not convey everything to wife (and neither does pt's father).  She was reassured to have it confirmed that he will have [facility] home health nursing and PT at home in addition to the oxygen.  There were questions about a follow up with a pulmonologist and also about eventual possible transition to hospice care that we talked about.  I also advised her to go to DSS and apply right away for Medicaid (again) as they could possibly be eligilbe as they were borderline in the past when she applied.  He could use all the help he can get.     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  ---SECOND eval by SLP showed he can tolerate a dysphagia 3 diet with thin liquids.  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 GERD (gastroesophageal reflux disease)   . History of chicken pox   . Myotonic dystrophy, type 1 ([place])     followed by [facility]  . History of pneumonia 2013    double PNA at [facility]  . LBBB (left bundle branch block)   . Cardiomyopathy ([place])     PAST SURGICAL HISTORY:  Past Surgical History  Procedure Laterality Date  . US echocardiography  09/2010    EF 50%, mld LVH and dysfunction, mild RV dysfunction and enlarged    Vital Signs: BP 104/61 mmHg  Pulse 92  Temp(Src) 98 F (36.7 C) (Oral)  Resp 18  Ht 5\' 11"  (1.803 m)  Wt 88.9 kg (195 lb 15.8 oz)  BMI 27.35 kg/m2  SpO2 100% Filed Weights   03/30/15 1506 03/30/15 2147 04/01/15 1731  Weight: 90.719 kg (200 lb) 86.546 kg (190 lb 12.8 oz) 88.9 kg (195 lb 15.8 oz)    Estimated body mass index is 27.35 kg/(m^2) as calculated from the following:   Height as of this encounter: 5\' 11"  (1.803 m).   Weight as of this encounter: 88.9 kg (195 lb 15.8 oz).  PHYSICAL EXAM: NAD Weak appearing Alert however EOMI Hrt rrr no mgr Lungs cta ant On oxygen via [place] Ext no cce Skin w/o mottling or cyanosis  LABS: CBC:    Component Value Date/Time   WBC 7.7 04/04/2015 0536  WBC 6.7 04/15/2014 0752   HGB 13.4 04/04/2015 0536   HGB 17.5 04/15/2014 0752   HCT 39.4* 04/04/2015 0536   HCT 52.2* 04/15/2014 0752   PLT 177 04/04/2015 0536   PLT 194 04/15/2014 0752   MCV 94.9 04/04/2015 0536   MCV 95 04/15/2014 0752   NEUTROABS 5.8 03/30/2015 1518   NEUTROABS 3.4 10/08/2013 0348   LYMPHSABS 0.9* 03/30/2015 1518   LYMPHSABS 1.4 10/08/2013 0348   MONOABS 0.7 03/30/2015 1518   MONOABS 0.5 10/08/2013 0348   EOSABS 0.0 03/30/2015 1518   EOSABS 0.2 10/08/2013 0348   BASOSABS 0.0 03/30/2015 1518   BASOSABS 0.0 10/08/2013 0348   Comprehensive Metabolic Panel:    Component Value Date/Time   NA 143 04/04/2015 0536   NA 141 04/15/2014 0752   K  4.2 04/04/2015 0536   K 4.1 04/15/2014 0752   CL 108 04/04/2015 0536   CL 104 04/15/2014 0752   CO2 29 04/04/2015 0536   CO2 30 04/15/2014 0752   BUN 8 04/04/2015 0536   BUN 11 04/15/2014 0752   CREATININE 0.71 04/04/2015 0536   CREATININE 1.22 04/15/2014 0752   GLUCOSE 99 04/04/2015 0536   GLUCOSE 128* 04/15/2014 0752   CALCIUM 8.7* 04/04/2015 0536   CALCIUM 8.9 04/15/2014 0752   AST 23 04/15/2014 0752   ALT 52 04/15/2014 0752   ALKPHOS 87 04/15/2014 0752   BILITOT 2.7* 04/15/2014 0752   PROT 7.7 04/15/2014 0752   ALBUMIN 3.7 04/15/2014 0752    More than 50% of the visit was spent in counseling/coordination of care: YES  Time Spent:  25 min

## 2015-04-07 NOTE — Progress Notes (Signed)
SATURATION QUALIFICATIONS: (This note is used to comply with regulatory documentation for home oxygen)  Patient Saturations on Room Air at Rest = 90%  Patient Saturations on Room Air while Ambulating = 85%  Patient Saturations on 3 Liters of oxygen while Ambulating = 91%  Please briefly explain why patient needs home oxygen: Chronic Respiratory Failure

## 2015-04-07 NOTE — Progress Notes (Signed)
Discussed discharge instructions and medication with pt, his wife, and his dad at bedside.  Both IVs removed.  Made pt's wife aware of medication sent to [place] and of follow up appointments.  No questions at this time.  Pt to be transported home via car by wife and his dad.  Clarise Cruz, RN

## 2015-04-07 NOTE — Care Management (Signed)
Admitted with acute respiratory failure. Lives with wife, Danette [phone]). Last seen Dr. Biagio Quint last week. Home Health thru [place] in the past. No skilled facility.. No home oxygen. Takes care of all basic activities of daily living himself, drives. Uses a cane to aid in ambulation as needed. No falls. Appetite is usually fair. No falls. Family will transport.  Pasted swallowing test today. Started on Dysphasia 3 diet. Oxygen 3 liters per nasal cannula.  Palliative Care consult in progress. Shelbie Ammons RN MSN CCM Care Management [phone]

## 2015-04-07 NOTE — Progress Notes (Addendum)
Palliative Medicine Inpatient Consult Follow Up Note   Name: James [name] Date: 04/07/2015 MRN: [id number]  DOB: [birth year]  Referring Physician: No att. providers found  Palliative Care consult requested for this 50 y.o. male for goals of medical therapy in patient with     TODAY'S DISCUSSIONS AND DECISIONS: 1. He desires to be DO NOT RESUSCITATE STATUS. His uncle had a DNR portable form and he understands this issue quite clearly. I have changed his code status to DNR and completed a portable DNR form for the pt and placed it in his paper chart.  2. He does not want to live with a trache and ventilator. He is OK with BIPAP when needed. He asked some questions that led me to believe he could change his mind on this down the road, but he also commented that he does not think this would be the kind of quality of life he wants to live with, so he is, for now, making it known that he does not want this.  3. Regarding feeding tube issue: He and his wife feel that he needs another trial at swallowing. I agree. They both want to avoid a feeding tube if at all possible. BUT they are not against it since it could be that he might be able to be fed via tube sometimes and via mouth sometimes --until it becomes known that oral options are out forever definitively.   I have asked for SLP to re-evaluate the pt. He is known to have dysphagia for 2-5 years but he is not as weak as he was on Friday and is not vomiting or spitting things up now and he wants food and liquids again.   Pt and wife will address feeding tube if he absolutely fails swallowing eval / MBSS and if outpt work up by GI indicates it is needed. They are inclined to want this done --- if it is known to be absolutely essential for his survival.   Dr Allen Norris, GI, recommends an upper GI eval and endoscopy as outpt to evaluate esophageal dysmotility --after he has improved from his pneumonia. That should all be ordered.  But for now, pt is still NPO and it needs to be determined if he is able to tolerate any diet texture at all --so he can go home (eating something) --and then have this work up done as an outptp.   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 GERD (gastroesophageal reflux disease)   . History of chicken pox   . Myotonic dystrophy, type 1 ([place])     followed by [facility]  . History of pneumonia 2013    double PNA at [facility]  . LBBB (left bundle branch block)   . Cardiomyopathy ([place])     PAST SURGICAL HISTORY:  Past Surgical History  Procedure Laterality Date  . US echocardiography  09/2010    EF 50%, mld LVH and dysfunction, mild RV dysfunction and enlarged    Vital Signs: BP 104/61 mmHg  Pulse 92  Temp(Src) 98 F (36.7 C) (Oral)  Resp 18  Ht 5\' 11"  (1.803 m)  Wt 88.9 kg (195 lb 15.8 oz)  BMI 27.35 kg/m2  SpO2 100% Filed Weights   03/30/15 1506 03/30/15 2147 04/01/15 1731  Weight: 90.719 kg (200 lb) 86.546 kg (190 lb 12.8 oz) 88.9 kg (195 lb 15.8 oz)    Estimated body mass index is 27.35 kg/(m^2) as calculated from the following:   Height as of this encounter: 5\' 11"  (1.803 m).   Weight as of this encounter: 88.9 kg (195 lb 15.8 oz).  PHYSICAL EXAM: Weak but  alert Able to speak and understands what is going on, but is often slow to talk and participate in discussion (though he does so) EOMI OP clear Hrt rrr no mgr Lungs cta anteriorly Still on oxygen via [place] Abd soft and nt Ext no cyanosis or mottling  LABS: CBC:    Component Value Date/Time   WBC 7.7 04/04/2015 0536   WBC 6.7 04/15/2014 0752   HGB 13.4 04/04/2015 0536   HGB 17.5 04/15/2014 0752   HCT 39.4* 04/04/2015 0536   HCT 52.2* 04/15/2014 0752   PLT 177 04/04/2015 0536   PLT 194 04/15/2014 0752   MCV 94.9 04/04/2015 0536   MCV 95 04/15/2014 0752   NEUTROABS 5.8 03/30/2015 1518   NEUTROABS 3.4 10/08/2013 0348   LYMPHSABS 0.9* 03/30/2015 1518   LYMPHSABS 1.4 10/08/2013 0348   MONOABS 0.7 03/30/2015 1518   MONOABS 0.5 10/08/2013 0348   EOSABS 0.0 03/30/2015 1518   EOSABS 0.2 10/08/2013 0348   BASOSABS 0.0 03/30/2015 1518   BASOSABS 0.0 10/08/2013 0348   Comprehensive Metabolic Panel:    Component Value Date/Time   NA 143 04/04/2015 0536   NA 141 04/15/2014 0752   K 4.2 04/04/2015 0536   K 4.1 04/15/2014 0752   CL 108 04/04/2015 0536   CL 104 04/15/2014 0752   CO2 29 04/04/2015 0536   CO2 30 04/15/2014 0752   BUN 8 04/04/2015 0536   BUN 11 04/15/2014 0752   CREATININE 0.71 04/04/2015 0536   CREATININE 1.22 04/15/2014 0752   GLUCOSE 99 04/04/2015 0536   GLUCOSE 128* 04/15/2014 0752   CALCIUM 8.7* 04/04/2015 0536   CALCIUM 8.9 04/15/2014 0752   AST 23 04/15/2014 0752   ALT 52 04/15/2014 0752   ALKPHOS 87 04/15/2014 0752   BILITOT 2.7* 04/15/2014 0752   PROT 7.7 04/15/2014 0752   ALBUMIN 3.7 04/15/2014 0752     More than 50% of the visit was spent in counseling/coordination of care: YES  Time Spent:  65 min

## 2015-04-07 NOTE — Discharge Instructions (Signed)
Advised to continue to follow with neurologist and pulmonologist in office.

## 2015-04-07 NOTE — Progress Notes (Signed)
Speech Language Pathology Treatment: Dysphagia  Patient Details Name: James [name] MRN: [id number] DOB: 08-18-1965 Today's Date: 04/07/2015 Time: 0810-0900 SLP Time Calculation (min) (ACUTE ONLY): 50 min  Assessment / Plan / Recommendation Clinical Impression  Pt appeared to safely and adequately tolerate trials of thin liquids via cup and soft foods w/ no overt s/s of aspiration noted. No overt oral phase deficits noted w/ consistencies as well; pt took his time to chew and clear orally b/t bites. Pt was able to feed self w/ min. Setup assist only. Pt appears at reduced risk for aspiration following general aspiration precautions including small, single bites and sips - slowly. Education given on precautions and diet prep/options to father then wife later. Rec. a Dietician consult for home dietary needs. Per wife's report, pt appears at his baseline w/ his swallowing at this time. NSG/MD updated.     HPI HPI: Pt is a 50 y.o. male with a known history of myotonic dystrophy type I, GERD, and pneumonia presenting with shortness of breath and cough. Wife described inconsistent s/s of dysphagia at home but he tolerates "smoothies" she makes and takes his meds w/ water at home. Pt had episodes of vomiting after getting meds ~2 days ago. Lung sounds very congested following w/ increased secretions initially during admission. Pt presents w/ improved respiratory status and is on room air w/ no overt laryngeal/pharyngeal secretions noted/expectorated. Pt is verbally conversive; father present.       SLP Plan  All goals met     Recommendations  Diet recommendations: Dysphagia 3 (mechanical soft);Thin liquid Liquids provided via: Cup Medication Administration: Whole meds with puree Supervision: Patient able to self feed (setup assist) Compensations: Minimize environmental distractions;Slow rate;Small sips/bites;Follow solids with liquid (moisten foods well) Postural Changes and/or Swallow Maneuvers:  Out of bed for meals;Seated upright 90 degrees              General recommendations:  (Dietician consult for nutritional support ) Oral Care Recommendations: Oral care BID;Patient independent with oral care Follow up Recommendations:  (TBD) Plan: All goals met   Watson,Katherine 04/07/2015, 1:10 PM

## 2015-04-07 NOTE — Plan of Care (Signed)
Problem: Coping: Goal: Verbalizations of decreased anxiety will increase Outcome: Progressing Family at bedside.  Problem: Physical Regulation: Goal: Diagnostic test results will improve Outcome: Progressing Scheduled for speech re-evaluation today.  Problem: Respiratory: Goal: Respiratory status will improve Outcome: Progressing Continues on 3L of 02 per [place]. No respiratory distress noted this shift. Goal: Pain level will decrease Outcome: Progressing No complaints of pain.

## 2015-04-07 NOTE — Discharge Summary (Addendum)
[place] at [place] NAME: James [name]    MR#:  [id number]  DATE OF BIRTH:  [birth year]  DATE OF ADMISSION:  03/30/2015 ADMITTING PHYSICIAN: Lytle Butte, MD  DATE OF DISCHARGE: 04/07/2015  PRIMARY CARE PHYSICIAN: [name], MD    ADMISSION DIAGNOSIS:  Wheezing [R06.2] Hypoxia [R09.02]  DISCHARGE DIAGNOSIS:  Principal Problem:   Acute respiratory insufficiency Active Problems:   Dysphagia  pneumonia.  SECONDARY DIAGNOSIS:   Past Medical History  Diagnosis Date  . GERD (gastroesophageal reflux disease)   . History of chicken pox   . Myotonic dystrophy, type 1 ([place])     followed by [facility]  . History of pneumonia 2013    double PNA at [facility]  . LBBB (left bundle branch block)   . Cardiomyopathy [facility])     HOSPITAL COURSE:   #1 Chronic hypoxic respiratory failure-secondary to pneumonia as noted on x-ray in the left lower lobe. Dysphagia. - required ICU due to respiratory distress. Briefly on BiPAP now on nasal cannula.   May need oxygen on discharge. - on augmentin now. - Speech therapy evaluation results and concern for aspiration or esophageal dysfunction.   Appreciated gastroenterology consultation. - Continue scopolamine patch for secretions. Continue supplemental oxygen. - Incentive spirometry -Continue duonebs and also antibiotics - zofran to help with nausea. - Pt and wife again had meeting with palliative care - they agreed to have Swallow eval  again, and he was started on diet for now. - he was updated about disease process and future complications by palliative care.  #2 GERD-on Protonix  #3 myotonic dystrophy-physical therapy. Ambulates at baseline without any assistance. Has a walker and also cane. Appreciated help by PT.  #4 DVT prophylaxis-on subcutaneous heparin  #5 Hypokalemia- being replaced  # 6 Ch respi failure with muscular dystrophy and now pneumonia.   Will need oxygen at  home.  DISCHARGE CONDITIONS:  Stable.  CONSULTS OBTAINED:  Treatment Team:  Lytle Butte, MD Flora Lipps, MD Lucilla Lame, MD  DRUG ALLERGIES:   Allergies  Allergen Reactions  . Codeine Nausea Only    DISCHARGE MEDICATIONS:   Current Discharge Medication List    START taking these medications   Details  albuterol (PROVENTIL) (2.5 MG/3ML) 0.083% nebulizer solution Take 3 mLs (2.5 mg total) by nebulization every 6 (six) hours as needed for wheezing or shortness of breath. Qty: 75 mL, Refills: 12      CONTINUE these medications which have NOT CHANGED   Details  acetaminophen (TYLENOL) 325 MG tablet Take 650 mg by mouth every 6 (six) hours as needed.    dextromethorphan (DELSYM) 30 MG/5ML liquid Take 30 mg by mouth 2 (two) times daily as needed for cough.    docusate sodium (COLACE) 50 MG capsule Take 50 mg by mouth as needed.     guaiFENesin (MUCINEX) 600 MG 12 hr tablet Take 600 mg by mouth 2 (two) times daily.    Multiple Vitamin (MULTIVITAMIN) tablet Take 1 tablet by mouth daily.    omeprazole (PRILOSEC) 20 MG capsule Take by mouth daily.          DISCHARGE INSTRUCTIONS:    Follow with neurologist as routine.  If you experience worsening of your admission symptoms, develop shortness of breath, life threatening emergency, suicidal or homicidal thoughts you must seek medical attention immediately by calling 911 or calling your MD immediately  if symptoms less severe.  You Must read complete instructions/literature along with all  the possible adverse reactions/side effects for all the Medicines you take and that have been prescribed to you. Take any new Medicines after you have completely understood and accept all the possible adverse reactions/side effects.   Please note  You were cared for by a hospitalist during your hospital stay. If you have any questions about your discharge medications or the care you received while you were in the hospital after you are  discharged, you can call the unit and asked to speak with the hospitalist on call if the hospitalist that took care of you is not available. Once you are discharged, your primary care physician will handle any further medical issues. Please note that NO REFILLS for any discharge medications will be authorized once you are discharged, as it is imperative that you return to your primary care physician (or establish a relationship with a primary care physician if you do not have one) for your aftercare needs so that they can reassess your need for medications and monitor your lab values.    Today   CHIEF COMPLAINT:   Chief Complaint  Patient presents with  . Cough  . Fever    HISTORY OF PRESENT ILLNESS:  James [name]  is a 50 y.o. male with a known history of myotonic dystrophy type I presenting with shortness of breath and cough. 4 day duration of cough productive of yellowish sputum, posttussive emesis, subjective fever/chills as well as shortness of breath. He noticed that his oxygen saturation was in the 80s on room air at home thus, to the Hospital further workup and evaluation.    VITAL SIGNS:  Blood pressure 96/73, pulse 91, temperature 98.6 F (37 C), temperature source Oral, resp. rate 18, height 5\' 11"  (1.803 m), weight 88.9 kg (195 lb 15.8 oz), SpO2 91 %.  I/O:   Intake/Output Summary (Last 24 hours) at 04/07/15 1157 Last data filed at 04/06/15 1833  Gross per 24 hour  Intake 573.33 ml  Output      0 ml  Net 573.33 ml    PHYSICAL EXAMINATION:   GENERAL: 50 y.o.-year-old patient lying in the bed with no acute distress. Weak EYES: Pupils equal, round, reactive to light and accommodation. No scleral icterus. Extraocular muscles intact.  HEENT: Head atraumatic, normocephalic. Oropharynx and nasopharynx clear.  NECK: Supple, no jugular venous distention. No thyroid enlargement, no tenderness.  LUNGS: mild rhonchi at the bases, left greater than right. Poor air movement.  No wheezing , mild crackles . No use of accessory muscles of respiration.  CARDIOVASCULAR: S1, S2 normal. No rubs, or gallops. 3/6 systolic murmur is present ABDOMEN: Soft, nontender, nondistended. Bowel sounds present. No organomegaly or mass.  EXTREMITIES: No pedal edema, cyanosis, or clubbing.  NEUROLOGIC: Cranial nerves II through XII are intact. Muscle strength 4/5 in all extremities. Sensation intact. Gait not checked. Generalized weakness is present PSYCHIATRIC: Alert, oriented 3, calm SKIN: No obvious rash, lesion, or ulcer.   DATA REVIEW:   CBC  Recent Labs Lab 04/04/15 0536  WBC 7.7  HGB 13.4  HCT 39.4*  PLT 177    Chemistries   Recent Labs Lab 04/04/15 0536  NA 143  K 4.2  CL 108  CO2 29  GLUCOSE 99  BUN 8  CREATININE 0.71  CALCIUM 8.7*    Cardiac Enzymes No results for input(s): TROPONINI in the last 168 hours.  Microbiology Results  Results for orders placed or performed during the hospital encounter of 03/30/15  MRSA PCR Screening  Status: None   Collection Time: 04/01/15  5:32 PM  Result Value Ref Range Status   MRSA by PCR NEGATIVE NEGATIVE Final    Comment:        The GeneXpert MRSA Assay (FDA approved for NASAL specimens only), is one component of a comprehensive MRSA colonization surveillance program. It is not intended to diagnose MRSA infection nor to guide or monitor treatment for MRSA infections.     RADIOLOGY:  Dg Chest 2 View  04/06/2015  CLINICAL DATA:  Pneumonia EXAM: CHEST  2 VIEW COMPARISON:  Two days ago FINDINGS: Unchanged lung volumes with left basilar opacity including air bronchograms. Stable small left pleural effusion. Improving right basilar aeration with better defined diaphragm. Normal heart size and stable mediastinal contours. IMPRESSION: Unchanged retrocardiac atelectasis or pneumonia with small pleural effusion. Electronically Signed   By: Monte Fantasia M.D.   On: 04/06/2015 08:11    EKG:   Orders  placed or performed in visit on 01/23/14  . EKG 12-Lead      Management plans discussed with the patient, family and they are in agreement.  CODE STATUS:     Code Status Orders        Start     Ordered   04/06/15 1523  Do not attempt resuscitation (DNR)   Continuous    Question Answer Comment  In the event of cardiac or respiratory ARREST Do not call a "code blue"   In the event of cardiac or respiratory ARREST Do not perform Intubation, CPR, defibrillation or ACLS   In the event of cardiac or respiratory ARREST Use medication by any route, position, wound care, and other measures to relive pain and suffering. May use oxygen, suction and manual treatment of airway obstruction as needed for comfort.   Comments At this time, pt prefers no ventilation or trache and would consider feeding tube only if essential.      04/06/15 1523    Code Status History    Date Active Date Inactive Code Status Order ID Comments User Context   03/30/2015  8:34 PM 04/06/2015  3:22 PM Full Code [id number]  Lytle Butte, MD ED      TOTAL TIME TAKING CARE OF THIS PATIENT: 35 minutes.    Vaughan Basta M.D on 04/07/2015 at 11:57 AM  Between 7am to 6pm - Pager - [phone]  After 6pm go to [email or web address] - password [email or web address] [facility]  Office  [phone]  CC: Primary care physician; [name], MD   Note: This dictation was prepared with Dragon dictation along with smaller phrase technology. Any transcriptional errors that result from this process are unintentional.

## 2015-04-08 ENCOUNTER — Telehealth: Payer: Self-pay

## 2015-04-08 ENCOUNTER — Telehealth: Payer: Self-pay | Admitting: *Deleted

## 2015-04-08 DIAGNOSIS — J9611 Chronic respiratory failure with hypoxia: Secondary | ICD-10-CM

## 2015-04-08 NOTE — Telephone Encounter (Signed)
Will follow up with Transitional Care Management.

## 2015-04-08 NOTE — Telephone Encounter (Signed)
Referral placed. Patient should probably have a follow-up from the hospital scheduled if he does not already have one.

## 2015-04-08 NOTE — Telephone Encounter (Signed)
Please advise? Will you do this without an appt?

## 2015-04-08 NOTE — Telephone Encounter (Signed)
Transition Care Management Follow-up Telephone Call   Date discharged? 04/07/15   How have you been since you were released from the hospital? Drinking plenty of fluids and tolerating solid foods, but having some diarrhea.   Using my home oxygen and resting okay.  Overall, doing ok.   Do you understand why you were in the hospital? Yes   Do you understand the discharge instructions? Yes and I am using my oxygen set at 3L, moving slowly and resting between activities.   Where were you discharged to? Home   Items Reviewed:  Medications reviewed: Yes, started mucinex, no problems. Albuterol as needed.  Taking and all other scheduled medications as prescribed.  Allergies reviewed: Yes, no changes  Dietary changes reviewed: Yes, low fat diet, no problems  Referrals reviewed: Yes, pulmonary referral placed.  Sees neurology in [place], appointment to be made by wife.   Functional Questionnaire:   Activities of Daily Living (ADLs):   He states they are independent in the following: Toileting, grooming, self feeding. States they require assistance with the following: Ambulating (uses cane).  Meal prep, dressing, bathing, wife to assist.  PT in progress.   Any transportation issues/concerns?: No.   Any patient concerns? None at this time.   Confirmed importance and date/time of follow-up visits scheduled Yes, appointment scheduled 04/09/15.  Provider Appointment booked with Dr. Caryl Bis (PCP).  Confirmed with patient if condition begins to worsen call PCP or go to the ER.  Patient was given the office number and encouraged to call back with question or concerns.  : Yes, patient verbalized understanding.

## 2015-04-08 NOTE — Telephone Encounter (Signed)
Denisa, Please advise, TCM visit?

## 2015-04-08 NOTE — Telephone Encounter (Signed)
Patient was D/C from [facility] on 04/07/15, he will need a referral Dr. Wallene Huh, pulmonolgy [place] [facility], with in two weeks  Contact his wife for any questions [phone].

## 2015-04-09 ENCOUNTER — Ambulatory Visit: Payer: Medicare Other | Admitting: Family Medicine

## 2015-04-09 ENCOUNTER — Telehealth: Payer: Self-pay | Admitting: Family Medicine

## 2015-04-09 DIAGNOSIS — J189 Pneumonia, unspecified organism: Secondary | ICD-10-CM | POA: Diagnosis not present

## 2015-04-09 DIAGNOSIS — Z9981 Dependence on supplemental oxygen: Secondary | ICD-10-CM | POA: Diagnosis not present

## 2015-04-09 DIAGNOSIS — R1310 Dysphagia, unspecified: Secondary | ICD-10-CM | POA: Diagnosis not present

## 2015-04-09 DIAGNOSIS — G7111 Myotonic muscular dystrophy: Secondary | ICD-10-CM | POA: Diagnosis not present

## 2015-04-09 DIAGNOSIS — I429 Cardiomyopathy, unspecified: Secondary | ICD-10-CM | POA: Diagnosis not present

## 2015-04-09 DIAGNOSIS — K219 Gastro-esophageal reflux disease without esophagitis: Secondary | ICD-10-CM | POA: Diagnosis not present

## 2015-04-09 DIAGNOSIS — J9611 Chronic respiratory failure with hypoxia: Secondary | ICD-10-CM | POA: Diagnosis not present

## 2015-04-09 DIAGNOSIS — I447 Left bundle-branch block, unspecified: Secondary | ICD-10-CM | POA: Diagnosis not present

## 2015-04-09 NOTE — Telephone Encounter (Signed)
Please advise 

## 2015-04-09 NOTE — Telephone Encounter (Signed)
Patient scheduled to come in to see Dr. Caryl Bis on Monday. Patient wife was fine with this.

## 2015-04-09 NOTE — Telephone Encounter (Signed)
Caller name: Oriel Klaus Relationship to patient: wife Can be reached: [phone]  Reason for call: Mrs. Wirthlin called very upset that the patients appt was moved to today. Mrs. Budner stated that it was very inappropriate to schedule a patient for a follow only two days after being discharged when they need to rest, and that it is not like they can just pile up in the car and come over. The patient has several appointments to make and with him being on O2 it is not easy. Mrs. Allert was advised that patient needed to follow up with their PCP within 7days of discharge and that the reason the appointment was rescheduled was due to Dr. Caryl Bis requesting that the patient be seen sooner. Mrs. Herston was advised that Dr. Caryl Bis may have felt that the patient may have needed to be sooner due to the diagnosis for hospitalization. Mrs. Runser again stated that it was inappropriate when the patient needs to rest and that maybe the patient can be seen next week sometime. Mrs Schrantz request a return call by this afternoon.

## 2015-04-10 DIAGNOSIS — Z9981 Dependence on supplemental oxygen: Secondary | ICD-10-CM | POA: Diagnosis not present

## 2015-04-10 DIAGNOSIS — R1310 Dysphagia, unspecified: Secondary | ICD-10-CM | POA: Diagnosis not present

## 2015-04-10 DIAGNOSIS — I447 Left bundle-branch block, unspecified: Secondary | ICD-10-CM | POA: Diagnosis not present

## 2015-04-10 DIAGNOSIS — I429 Cardiomyopathy, unspecified: Secondary | ICD-10-CM | POA: Diagnosis not present

## 2015-04-10 DIAGNOSIS — G7111 Myotonic muscular dystrophy: Secondary | ICD-10-CM | POA: Diagnosis not present

## 2015-04-10 DIAGNOSIS — J9611 Chronic respiratory failure with hypoxia: Secondary | ICD-10-CM | POA: Diagnosis not present

## 2015-04-10 DIAGNOSIS — K219 Gastro-esophageal reflux disease without esophagitis: Secondary | ICD-10-CM | POA: Diagnosis not present

## 2015-04-10 DIAGNOSIS — J189 Pneumonia, unspecified organism: Secondary | ICD-10-CM | POA: Diagnosis not present

## 2015-04-10 NOTE — Telephone Encounter (Signed)
FYI

## 2015-04-10 NOTE — Telephone Encounter (Signed)
Ann from [facility] called to inform the physician that the patient has declined Transitional Care Management. She spoke with the patient's wife she informed her that he is receiving PT, speech and a nurse is coming out to see him . He did decline an aid to help him at home. He does have all of his medication and is stable. If the physician thinks the patient is needing follow up by transitional care management please give Lelon Frohlich a call. She is asking that he speak with the patient and his wife when they come to the appointment on 1.16.17 to see if they will allow them to follow up and if so please give Lelon Frohlich a Call at [phone] in order to restart the process.

## 2015-04-13 ENCOUNTER — Encounter: Payer: Self-pay | Admitting: Family Medicine

## 2015-04-13 ENCOUNTER — Ambulatory Visit (INDEPENDENT_AMBULATORY_CARE_PROVIDER_SITE_OTHER): Payer: Commercial Managed Care - HMO | Admitting: Family Medicine

## 2015-04-13 VITALS — BP 96/62 | HR 85 | Temp 97.5°F | Ht 71.0 in | Wt 195.6 lb

## 2015-04-13 DIAGNOSIS — R0689 Other abnormalities of breathing: Secondary | ICD-10-CM

## 2015-04-13 MED ORDER — LORATADINE 10 MG PO TABS: 10.0000 mg | ORAL_TABLET | Freq: Every day | ORAL | Status: DC

## 2015-04-13 NOTE — Progress Notes (Signed)
Pre visit review using our clinic review tool, if applicable. No additional management support is needed unless otherwise documented below in the visit note. 

## 2015-04-13 NOTE — Patient Instructions (Addendum)
Nice to see you. Please continue to monitor your breathing.  Try taking the mucinex in the morning and stopping it at night to see if this helps the itching. If you have increasing congestion with this decrease you need to let us know.  You can try claritin for itching.  If you develop chest pain, shortness of breath, fever, productive cough, blood in your cough, trouble swallowing, or any new or change in symptoms please seek medical attention.

## 2015-04-13 NOTE — Assessment & Plan Note (Signed)
Doing well since discharge from the hospital. Has been stable on 2 L nasal cannula. Vital signs are stable today. Appears to be following his diet. He will have speech therapy at his house. He'll continue physical therapy. Nurse will continue to follow with him. He'll follow up with pulmonology on 04/20/15. After discussion regarding his itching he and his wife would like to try just taking Mucinex in the morning. Advised that they needed to monitor for increasing congestion with this change. Advised that they could try Claritin for itching, though advised if decreasing the dose of Mucinex helps they should hold off on the Claritin. No rash noted on exam. He is given return precautions.

## 2015-04-13 NOTE — Telephone Encounter (Signed)
Noted  

## 2015-04-13 NOTE — Progress Notes (Signed)
Patient ID: James [name], male   DOB: [birth year], 50 y.o.   MRN: [id number]  [name], MD Phone: [phone]  James [name] is a 50 y.o. male who presents today for follow-up.  Patient presents for hospital follow-up. He was hospitalized after developing likely aspiration pneumonia and hypoxia. He required BiPAP for short period of time in the ICU. He was discharged on 2 L nasal cannula. He notes he is doing much better since discharge. He is breathing well. No chest pain. He's been following the diet as directed. He had several swallowing studies and was on a dysphagia 3 diet at discharge. He notes he has been doing small sips and small bites. No soft drinks or carbonated drinks. He's also using the nebulizer 2-3 times a day. He has physical therapy, nursing, and speech therapy coming to his house. He has follow-up with pulmonology on 04/20/15. He does note his only complaint is itching. He notes since starting on the Mucinex at home he has been itching at night. No rash. No fevers. Notes last night he did not take the Mucinex and he did not have itching. Nobody else in the household with itching or rash.  PMH: nonsmoker.   ROS see history of present illness  Objective  Physical Exam Filed Vitals:   04/13/15 1053  BP: 96/62  Pulse: 85  Temp: 97.5 F (36.4 C)    Physical Exam  Constitutional: No distress.  HENT:  Head: Normocephalic and atraumatic.  Right Ear: External ear normal.  Left Ear: External ear normal.  Mouth/Throat: Oropharynx is clear and moist. No oropharyngeal exudate.  Eyes: Conjunctivae are normal. Pupils are equal, round, and reactive to light.  Neck: Neck supple.  Cardiovascular: Normal rate, regular rhythm and normal heart sounds.  Exam reveals no gallop and no friction rub.   No murmur heard. Pulmonary/Chest: Effort normal and breath sounds normal. No respiratory distress. He has no wheezes.  Minimal crackles at both lung bases  Musculoskeletal: He  exhibits no edema.  Lymphadenopathy:    He has no cervical adenopathy.  Neurological: He is alert. Gait normal.  Skin: Skin is warm and dry. He is not diaphoretic.  No rash noted on back, abdomen, arms     Assessment/Plan: Please see individual problem list.  Acute respiratory insufficiency Doing well since discharge from the hospital. Has been stable on 2 L nasal cannula. Vital signs are stable today. Appears to be following his diet. He will have speech therapy at his house. He'll continue physical therapy. Nurse will continue to follow with him. He'll follow up with pulmonology on 04/20/15. After discussion regarding his itching he and his wife would like to try just taking Mucinex in the morning. Advised that they needed to monitor for increasing congestion with this change. Advised that they could try Claritin for itching, though advised if decreasing the dose of Mucinex helps they should hold off on the Claritin. No rash noted on exam. He is given return precautions.    [name]

## 2015-04-14 ENCOUNTER — Telehealth: Payer: Self-pay | Admitting: Family Medicine

## 2015-04-14 DIAGNOSIS — J9611 Chronic respiratory failure with hypoxia: Secondary | ICD-10-CM | POA: Diagnosis not present

## 2015-04-14 DIAGNOSIS — K219 Gastro-esophageal reflux disease without esophagitis: Secondary | ICD-10-CM | POA: Diagnosis not present

## 2015-04-14 DIAGNOSIS — J690 Pneumonitis due to inhalation of food and vomit: Secondary | ICD-10-CM

## 2015-04-14 DIAGNOSIS — I447 Left bundle-branch block, unspecified: Secondary | ICD-10-CM | POA: Diagnosis not present

## 2015-04-14 DIAGNOSIS — Z9981 Dependence on supplemental oxygen: Secondary | ICD-10-CM | POA: Diagnosis not present

## 2015-04-14 DIAGNOSIS — J189 Pneumonia, unspecified organism: Secondary | ICD-10-CM | POA: Diagnosis not present

## 2015-04-14 DIAGNOSIS — I429 Cardiomyopathy, unspecified: Secondary | ICD-10-CM | POA: Diagnosis not present

## 2015-04-14 DIAGNOSIS — R1310 Dysphagia, unspecified: Secondary | ICD-10-CM | POA: Diagnosis not present

## 2015-04-14 DIAGNOSIS — G7111 Myotonic muscular dystrophy: Secondary | ICD-10-CM | POA: Diagnosis not present

## 2015-04-14 NOTE — Telephone Encounter (Signed)
LMOM to call offfice back to get more details about speech therapy order./tvw

## 2015-04-14 NOTE — Telephone Encounter (Signed)
James [name] [id number] called from [facility] regarding needing very order for pt to get speech therapy. Thank You!

## 2015-04-15 DIAGNOSIS — I429 Cardiomyopathy, unspecified: Secondary | ICD-10-CM | POA: Diagnosis not present

## 2015-04-15 DIAGNOSIS — G7111 Myotonic muscular dystrophy: Secondary | ICD-10-CM | POA: Diagnosis not present

## 2015-04-15 DIAGNOSIS — J189 Pneumonia, unspecified organism: Secondary | ICD-10-CM | POA: Diagnosis not present

## 2015-04-15 DIAGNOSIS — Z9981 Dependence on supplemental oxygen: Secondary | ICD-10-CM | POA: Diagnosis not present

## 2015-04-15 DIAGNOSIS — I447 Left bundle-branch block, unspecified: Secondary | ICD-10-CM | POA: Diagnosis not present

## 2015-04-15 DIAGNOSIS — K219 Gastro-esophageal reflux disease without esophagitis: Secondary | ICD-10-CM | POA: Diagnosis not present

## 2015-04-15 DIAGNOSIS — R1310 Dysphagia, unspecified: Secondary | ICD-10-CM | POA: Diagnosis not present

## 2015-04-15 DIAGNOSIS — J9611 Chronic respiratory failure with hypoxia: Secondary | ICD-10-CM | POA: Diagnosis not present

## 2015-04-15 NOTE — Telephone Encounter (Signed)
Order placed

## 2015-04-15 NOTE — Addendum Note (Signed)
Addended by: Caryl Bis, ERIC G on: 04/15/2015 12:20 PM   Modules accepted: Orders

## 2015-04-15 NOTE — Telephone Encounter (Signed)
[facility] needs an order to start speech therapy, they stated that can be a verbal order to start with or we can fax them with the order. Please advise./tvw

## 2015-04-16 DIAGNOSIS — K219 Gastro-esophageal reflux disease without esophagitis: Secondary | ICD-10-CM | POA: Diagnosis not present

## 2015-04-16 DIAGNOSIS — J9611 Chronic respiratory failure with hypoxia: Secondary | ICD-10-CM | POA: Diagnosis not present

## 2015-04-16 DIAGNOSIS — R1310 Dysphagia, unspecified: Secondary | ICD-10-CM | POA: Diagnosis not present

## 2015-04-16 DIAGNOSIS — Z9981 Dependence on supplemental oxygen: Secondary | ICD-10-CM | POA: Diagnosis not present

## 2015-04-16 DIAGNOSIS — I447 Left bundle-branch block, unspecified: Secondary | ICD-10-CM | POA: Diagnosis not present

## 2015-04-16 DIAGNOSIS — J189 Pneumonia, unspecified organism: Secondary | ICD-10-CM | POA: Diagnosis not present

## 2015-04-16 DIAGNOSIS — I429 Cardiomyopathy, unspecified: Secondary | ICD-10-CM | POA: Diagnosis not present

## 2015-04-16 DIAGNOSIS — G7111 Myotonic muscular dystrophy: Secondary | ICD-10-CM | POA: Diagnosis not present

## 2015-04-17 DIAGNOSIS — Z9981 Dependence on supplemental oxygen: Secondary | ICD-10-CM | POA: Diagnosis not present

## 2015-04-17 DIAGNOSIS — J9611 Chronic respiratory failure with hypoxia: Secondary | ICD-10-CM | POA: Diagnosis not present

## 2015-04-17 DIAGNOSIS — J189 Pneumonia, unspecified organism: Secondary | ICD-10-CM | POA: Diagnosis not present

## 2015-04-17 DIAGNOSIS — R1310 Dysphagia, unspecified: Secondary | ICD-10-CM | POA: Diagnosis not present

## 2015-04-17 DIAGNOSIS — I447 Left bundle-branch block, unspecified: Secondary | ICD-10-CM | POA: Diagnosis not present

## 2015-04-17 DIAGNOSIS — I429 Cardiomyopathy, unspecified: Secondary | ICD-10-CM | POA: Diagnosis not present

## 2015-04-17 DIAGNOSIS — G7111 Myotonic muscular dystrophy: Secondary | ICD-10-CM | POA: Diagnosis not present

## 2015-04-17 DIAGNOSIS — K219 Gastro-esophageal reflux disease without esophagitis: Secondary | ICD-10-CM | POA: Diagnosis not present

## 2015-04-20 DIAGNOSIS — G479 Sleep disorder, unspecified: Secondary | ICD-10-CM | POA: Diagnosis not present

## 2015-04-20 DIAGNOSIS — G7111 Myotonic muscular dystrophy: Secondary | ICD-10-CM | POA: Diagnosis not present

## 2015-04-20 DIAGNOSIS — R918 Other nonspecific abnormal finding of lung field: Secondary | ICD-10-CM | POA: Diagnosis not present

## 2015-04-20 DIAGNOSIS — J189 Pneumonia, unspecified organism: Secondary | ICD-10-CM | POA: Diagnosis not present

## 2015-04-20 DIAGNOSIS — R1312 Dysphagia, oropharyngeal phase: Secondary | ICD-10-CM | POA: Diagnosis not present

## 2015-04-21 ENCOUNTER — Ambulatory Visit: Payer: Medicare Other | Admitting: Family Medicine

## 2015-04-22 DIAGNOSIS — I447 Left bundle-branch block, unspecified: Secondary | ICD-10-CM | POA: Diagnosis not present

## 2015-04-22 DIAGNOSIS — G7111 Myotonic muscular dystrophy: Secondary | ICD-10-CM | POA: Diagnosis not present

## 2015-04-22 DIAGNOSIS — I429 Cardiomyopathy, unspecified: Secondary | ICD-10-CM | POA: Diagnosis not present

## 2015-04-22 DIAGNOSIS — K219 Gastro-esophageal reflux disease without esophagitis: Secondary | ICD-10-CM | POA: Diagnosis not present

## 2015-04-22 DIAGNOSIS — J9611 Chronic respiratory failure with hypoxia: Secondary | ICD-10-CM | POA: Diagnosis not present

## 2015-04-22 DIAGNOSIS — J189 Pneumonia, unspecified organism: Secondary | ICD-10-CM | POA: Diagnosis not present

## 2015-04-22 DIAGNOSIS — R1310 Dysphagia, unspecified: Secondary | ICD-10-CM | POA: Diagnosis not present

## 2015-04-22 DIAGNOSIS — Z9981 Dependence on supplemental oxygen: Secondary | ICD-10-CM | POA: Diagnosis not present

## 2015-04-24 DIAGNOSIS — I429 Cardiomyopathy, unspecified: Secondary | ICD-10-CM | POA: Diagnosis not present

## 2015-04-24 DIAGNOSIS — G7111 Myotonic muscular dystrophy: Secondary | ICD-10-CM | POA: Diagnosis not present

## 2015-04-24 DIAGNOSIS — Z9981 Dependence on supplemental oxygen: Secondary | ICD-10-CM | POA: Diagnosis not present

## 2015-04-24 DIAGNOSIS — R1310 Dysphagia, unspecified: Secondary | ICD-10-CM | POA: Diagnosis not present

## 2015-04-24 DIAGNOSIS — K219 Gastro-esophageal reflux disease without esophagitis: Secondary | ICD-10-CM | POA: Diagnosis not present

## 2015-04-24 DIAGNOSIS — J189 Pneumonia, unspecified organism: Secondary | ICD-10-CM | POA: Diagnosis not present

## 2015-04-24 DIAGNOSIS — I447 Left bundle-branch block, unspecified: Secondary | ICD-10-CM | POA: Diagnosis not present

## 2015-04-24 DIAGNOSIS — J9611 Chronic respiratory failure with hypoxia: Secondary | ICD-10-CM | POA: Diagnosis not present

## 2015-04-28 DIAGNOSIS — I429 Cardiomyopathy, unspecified: Secondary | ICD-10-CM | POA: Diagnosis not present

## 2015-04-28 DIAGNOSIS — I447 Left bundle-branch block, unspecified: Secondary | ICD-10-CM | POA: Diagnosis not present

## 2015-04-28 DIAGNOSIS — J189 Pneumonia, unspecified organism: Secondary | ICD-10-CM | POA: Diagnosis not present

## 2015-04-28 DIAGNOSIS — Z9981 Dependence on supplemental oxygen: Secondary | ICD-10-CM | POA: Diagnosis not present

## 2015-04-28 DIAGNOSIS — G7111 Myotonic muscular dystrophy: Secondary | ICD-10-CM | POA: Diagnosis not present

## 2015-04-28 DIAGNOSIS — J9611 Chronic respiratory failure with hypoxia: Secondary | ICD-10-CM | POA: Diagnosis not present

## 2015-04-28 DIAGNOSIS — K219 Gastro-esophageal reflux disease without esophagitis: Secondary | ICD-10-CM | POA: Diagnosis not present

## 2015-04-28 DIAGNOSIS — R1310 Dysphagia, unspecified: Secondary | ICD-10-CM | POA: Diagnosis not present

## 2015-04-30 DIAGNOSIS — K219 Gastro-esophageal reflux disease without esophagitis: Secondary | ICD-10-CM | POA: Diagnosis not present

## 2015-04-30 DIAGNOSIS — J189 Pneumonia, unspecified organism: Secondary | ICD-10-CM | POA: Diagnosis not present

## 2015-04-30 DIAGNOSIS — G7111 Myotonic muscular dystrophy: Secondary | ICD-10-CM | POA: Diagnosis not present

## 2015-04-30 DIAGNOSIS — J9611 Chronic respiratory failure with hypoxia: Secondary | ICD-10-CM | POA: Diagnosis not present

## 2015-04-30 DIAGNOSIS — I429 Cardiomyopathy, unspecified: Secondary | ICD-10-CM | POA: Diagnosis not present

## 2015-04-30 DIAGNOSIS — I447 Left bundle-branch block, unspecified: Secondary | ICD-10-CM | POA: Diagnosis not present

## 2015-04-30 DIAGNOSIS — R1310 Dysphagia, unspecified: Secondary | ICD-10-CM | POA: Diagnosis not present

## 2015-04-30 DIAGNOSIS — Z9981 Dependence on supplemental oxygen: Secondary | ICD-10-CM | POA: Diagnosis not present

## 2015-05-06 DIAGNOSIS — K219 Gastro-esophageal reflux disease without esophagitis: Secondary | ICD-10-CM | POA: Diagnosis not present

## 2015-05-06 DIAGNOSIS — R1310 Dysphagia, unspecified: Secondary | ICD-10-CM | POA: Diagnosis not present

## 2015-05-06 DIAGNOSIS — J9611 Chronic respiratory failure with hypoxia: Secondary | ICD-10-CM | POA: Diagnosis not present

## 2015-05-06 DIAGNOSIS — J189 Pneumonia, unspecified organism: Secondary | ICD-10-CM | POA: Diagnosis not present

## 2015-05-06 DIAGNOSIS — G7111 Myotonic muscular dystrophy: Secondary | ICD-10-CM | POA: Diagnosis not present

## 2015-05-06 DIAGNOSIS — I447 Left bundle-branch block, unspecified: Secondary | ICD-10-CM | POA: Diagnosis not present

## 2015-05-06 DIAGNOSIS — Z9981 Dependence on supplemental oxygen: Secondary | ICD-10-CM | POA: Diagnosis not present

## 2015-05-06 DIAGNOSIS — I429 Cardiomyopathy, unspecified: Secondary | ICD-10-CM | POA: Diagnosis not present

## 2015-05-08 DIAGNOSIS — I447 Left bundle-branch block, unspecified: Secondary | ICD-10-CM | POA: Diagnosis not present

## 2015-05-08 DIAGNOSIS — J9611 Chronic respiratory failure with hypoxia: Secondary | ICD-10-CM | POA: Diagnosis not present

## 2015-05-08 DIAGNOSIS — J189 Pneumonia, unspecified organism: Secondary | ICD-10-CM | POA: Diagnosis not present

## 2015-05-08 DIAGNOSIS — K219 Gastro-esophageal reflux disease without esophagitis: Secondary | ICD-10-CM | POA: Diagnosis not present

## 2015-05-08 DIAGNOSIS — G710 Muscular dystrophy: Secondary | ICD-10-CM | POA: Diagnosis not present

## 2015-05-08 DIAGNOSIS — Z9981 Dependence on supplemental oxygen: Secondary | ICD-10-CM | POA: Diagnosis not present

## 2015-05-08 DIAGNOSIS — G7111 Myotonic muscular dystrophy: Secondary | ICD-10-CM | POA: Diagnosis not present

## 2015-05-08 DIAGNOSIS — R1310 Dysphagia, unspecified: Secondary | ICD-10-CM | POA: Diagnosis not present

## 2015-05-08 DIAGNOSIS — I429 Cardiomyopathy, unspecified: Secondary | ICD-10-CM | POA: Diagnosis not present

## 2015-05-08 DIAGNOSIS — J129 Viral pneumonia, unspecified: Secondary | ICD-10-CM | POA: Diagnosis not present

## 2015-05-13 DIAGNOSIS — J189 Pneumonia, unspecified organism: Secondary | ICD-10-CM | POA: Diagnosis not present

## 2015-05-13 DIAGNOSIS — K219 Gastro-esophageal reflux disease without esophagitis: Secondary | ICD-10-CM | POA: Diagnosis not present

## 2015-05-13 DIAGNOSIS — I429 Cardiomyopathy, unspecified: Secondary | ICD-10-CM | POA: Diagnosis not present

## 2015-05-13 DIAGNOSIS — Z9981 Dependence on supplemental oxygen: Secondary | ICD-10-CM | POA: Diagnosis not present

## 2015-05-13 DIAGNOSIS — J9611 Chronic respiratory failure with hypoxia: Secondary | ICD-10-CM | POA: Diagnosis not present

## 2015-05-13 DIAGNOSIS — G7111 Myotonic muscular dystrophy: Secondary | ICD-10-CM | POA: Diagnosis not present

## 2015-05-13 DIAGNOSIS — R1310 Dysphagia, unspecified: Secondary | ICD-10-CM | POA: Diagnosis not present

## 2015-05-13 DIAGNOSIS — I447 Left bundle-branch block, unspecified: Secondary | ICD-10-CM | POA: Diagnosis not present

## 2015-05-14 ENCOUNTER — Ambulatory Visit: Payer: Medicare Other | Admitting: Family Medicine

## 2015-05-20 DIAGNOSIS — K219 Gastro-esophageal reflux disease without esophagitis: Secondary | ICD-10-CM | POA: Diagnosis not present

## 2015-05-20 DIAGNOSIS — G7111 Myotonic muscular dystrophy: Secondary | ICD-10-CM | POA: Diagnosis not present

## 2015-05-20 DIAGNOSIS — Z9981 Dependence on supplemental oxygen: Secondary | ICD-10-CM | POA: Diagnosis not present

## 2015-05-20 DIAGNOSIS — I429 Cardiomyopathy, unspecified: Secondary | ICD-10-CM | POA: Diagnosis not present

## 2015-05-20 DIAGNOSIS — I447 Left bundle-branch block, unspecified: Secondary | ICD-10-CM | POA: Diagnosis not present

## 2015-05-20 DIAGNOSIS — J189 Pneumonia, unspecified organism: Secondary | ICD-10-CM | POA: Diagnosis not present

## 2015-05-20 DIAGNOSIS — J9611 Chronic respiratory failure with hypoxia: Secondary | ICD-10-CM | POA: Diagnosis not present

## 2015-05-20 DIAGNOSIS — R1310 Dysphagia, unspecified: Secondary | ICD-10-CM | POA: Diagnosis not present

## 2015-05-21 ENCOUNTER — Ambulatory Visit (INDEPENDENT_AMBULATORY_CARE_PROVIDER_SITE_OTHER): Payer: Commercial Managed Care - HMO | Admitting: Family Medicine

## 2015-05-21 ENCOUNTER — Encounter: Payer: Self-pay | Admitting: Family Medicine

## 2015-05-21 VITALS — BP 92/72 | HR 89 | Temp 97.5°F | Wt 199.1 lb

## 2015-05-21 DIAGNOSIS — M7582 Other shoulder lesions, left shoulder: Secondary | ICD-10-CM | POA: Diagnosis not present

## 2015-05-21 DIAGNOSIS — R079 Chest pain, unspecified: Secondary | ICD-10-CM

## 2015-05-21 DIAGNOSIS — R05 Cough: Secondary | ICD-10-CM | POA: Diagnosis not present

## 2015-05-21 DIAGNOSIS — J069 Acute upper respiratory infection, unspecified: Secondary | ICD-10-CM | POA: Diagnosis not present

## 2015-05-21 DIAGNOSIS — R059 Cough, unspecified: Secondary | ICD-10-CM

## 2015-05-21 DIAGNOSIS — R0789 Other chest pain: Secondary | ICD-10-CM

## 2015-05-21 NOTE — Progress Notes (Signed)
Pre visit review using our clinic review tool, if applicable. No additional management support is needed unless otherwise documented below in the visit note. 

## 2015-05-21 NOTE — Patient Instructions (Addendum)
Nice to see you. Please continue the Claritin for allergies and runny nose and sore throat. Please do the exercises listed below for your shoulder. You can ice your shoulder as well. Please go get the chest x-ray to evaluate your lungs. You should keep follow-up with your cardiologist. If you develop recurrence of the chest discomfort, trouble breathing, fever, cough productive of blood, or any new or change in symptoms please seek medical attention.  Impingement Syndrome, Rotator Cuff, Bursitis With Rehab Impingement syndrome is a condition that involves inflammation of the tendons of the rotator cuff and the subacromial bursa, that causes pain in the shoulder. The rotator cuff consists of four tendons and muscles that control much of the shoulder and upper arm function. The subacromial bursa is a fluid filled sac that helps reduce friction between the rotator cuff and one of the bones of the shoulder (acromion). Impingement syndrome is usually an overuse injury that causes swelling of the bursa (bursitis), swelling of the tendon (tendonitis), and/or a tear of the tendon (strain). Strains are classified into three categories. Grade 1 strains cause pain, but the tendon is not lengthened. Grade 2 strains include a lengthened ligament, due to the ligament being stretched or partially ruptured. With grade 2 strains there is still function, although the function may be decreased. Grade 3 strains include a complete tear of the tendon or muscle, and function is usually impaired. SYMPTOMS   Pain around the shoulder, often at the outer portion of the upper arm.  Pain that gets worse with shoulder function, especially when reaching overhead or lifting.  Sometimes, aching when not using the arm.  Pain that wakes you up at night.  Sometimes, tenderness, swelling, warmth, or redness over the affected area.  Loss of strength.  Limited motion of the shoulder, especially reaching behind the back (to the  back pocket or to unhook bra) or across your body.  Crackling sound (crepitation) when moving the arm.  Biceps tendon pain and inflammation (in the front of the shoulder). Worse when bending the elbow or lifting. CAUSES  Impingement syndrome is often an overuse injury, in which chronic (repetitive) motions cause the tendons or bursa to become inflamed. A strain occurs when a force is paced on the tendon or muscle that is greater than it can withstand. Common mechanisms of injury include: Stress from sudden increase in duration, frequency, or intensity of training.  Direct hit (trauma) to the shoulder.  Aging, erosion of the tendon with normal use.  Bony bump on shoulder (acromial spur). RISK INCREASES WITH:  Contact sports (football, wrestling, boxing).  Throwing sports (baseball, tennis, volleyball).  Weightlifting and bodybuilding.  Heavy labor.  Previous injury to the rotator cuff, including impingement.  Poor shoulder strength and flexibility.  Failure to warm up properly before activity.  Inadequate protective equipment.  Old age.  Bony bump on shoulder (acromial spur). PREVENTION   Warm up and stretch properly before activity.  Allow for adequate recovery between workouts.  Maintain physical fitness:  Strength, flexibility, and endurance.  Cardiovascular fitness.  Learn and use proper exercise technique. PROGNOSIS  If treated properly, impingement syndrome usually goes away within 6 weeks. Sometimes surgery is required.  RELATED COMPLICATIONS   Longer healing time if not properly treated, or if not given enough time to heal.  Recurring symptoms, that result in a chronic condition.  Shoulder stiffness, frozen shoulder, or loss of motion.  Rotator cuff tendon tear.  Recurring symptoms, especially if activity is resumed too soon,  with overuse, with a direct blow, or when using poor technique. TREATMENT  Treatment first involves the use of ice and  medicine, to reduce pain and inflammation. The use of strengthening and stretching exercises may help reduce pain with activity. These exercises may be performed at home or with a therapist. If non-surgical treatment is unsuccessful after more than 6 months, surgery may be advised. After surgery and rehabilitation, activity is usually possible in 3 months.  MEDICATION  If pain medicine is needed, nonsteroidal anti-inflammatory medicines (aspirin and ibuprofen), or other minor pain relievers (acetaminophen), are often advised.  Do not take pain medicine for 7 days before surgery.  Prescription pain relievers may be given, if your caregiver thinks they are needed. Use only as directed and only as much as you need.  Corticosteroid injections may be given by your caregiver. These injections should be reserved for the most serious cases, because they may only be given a certain number of times. HEAT AND COLD  Cold treatment (icing) should be applied for 10 to 15 minutes every 2 to 3 hours for inflammation and pain, and immediately after activity that aggravates your symptoms. Use ice packs or an ice massage.  Heat treatment may be used before performing stretching and strengthening activities prescribed by your caregiver, physical therapist, or athletic trainer. Use a heat pack or a warm water soak. SEEK MEDICAL CARE IF:   Symptoms get worse or do not improve in 4 to 6 weeks, despite treatment.  New, unexplained symptoms develop. (Drugs used in treatment may produce side effects.) EXERCISES  RANGE OF MOTION (ROM) AND STRETCHING EXERCISES - Impingement Syndrome (Rotator Cuff  Tendinitis, Bursitis) These exercises may help you when beginning to rehabilitate your injury. Your symptoms may go away with or without further involvement from your physician, physical therapist or athletic trainer. While completing these exercises, remember:   Restoring tissue flexibility helps normal motion to return to  the joints. This allows healthier, less painful movement and activity.  An effective stretch should be held for at least 30 seconds.  A stretch should never be painful. You should only feel a gentle lengthening or release in the stretched tissue. STRETCH - Flexion, Standing  Stand with good posture. With an underhand grip on your right / left hand, and an overhand grip on the opposite hand, grasp a broomstick or cane so that your hands are a little more than shoulder width apart.  Keeping your right / left elbow straight and shoulder muscles relaxed, push the stick with your opposite hand, to raise your right / left arm in front of your body and then overhead. Raise your arm until you feel a stretch in your right / left shoulder, but before you have increased shoulder pain.  Try to avoid shrugging your right / left shoulder as your arm rises, by keeping your shoulder blade tucked down and toward your mid-back spine. Hold for __________ seconds.  Slowly return to the starting position. Repeat __________ times. Complete this exercise __________ times per day. STRETCH - Abduction, Supine  Lie on your back. With an underhand grip on your right / left hand and an overhand grip on the opposite hand, grasp a broomstick or cane so that your hands are a little more than shoulder width apart.  Keeping your right / left elbow straight and your shoulder muscles relaxed, push the stick with your opposite hand, to raise your right / left arm out to the side of your body and then overhead.  Raise your arm until you feel a stretch in your right / left shoulder, but before you have increased shoulder pain.  Try to avoid shrugging your right / left shoulder as your arm rises, by keeping your shoulder blade tucked down and toward your mid-back spine. Hold for __________ seconds.  Slowly return to the starting position. Repeat __________ times. Complete this exercise __________ times per day. ROM - Flexion,  Active-Assisted  Lie on your back. You may bend your knees for comfort.  Grasp a broomstick or cane so your hands are about shoulder width apart. Your right / left hand should grip the end of the stick, so that your hand is positioned "thumbs-up," as if you were about to shake hands.  Using your healthy arm to lead, raise your right / left arm overhead, until you feel a gentle stretch in your shoulder. Hold for __________ seconds.  Use the stick to assist in returning your right / left arm to its starting position. Repeat __________ times. Complete this exercise __________ times per day.  ROM - Internal Rotation, Supine   Lie on your back on a firm surface. Place your right / left elbow about 60 degrees away from your side. Elevate your elbow with a folded towel, so that the elbow and shoulder are the same height.  Using a broomstick or cane and your strong arm, pull your right / left hand toward your body until you feel a gentle stretch, but no increase in your shoulder pain. Keep your shoulder and elbow in place throughout the exercise.  Hold for __________ seconds. Slowly return to the starting position. Repeat __________ times. Complete this exercise __________ times per day. STRETCH - Internal Rotation  Place your right / left hand behind your back, palm up.  Throw a towel or belt over your opposite shoulder. Grasp the towel with your right / left hand.  While keeping an upright posture, gently pull up on the towel, until you feel a stretch in the front of your right / left shoulder.  Avoid shrugging your right / left shoulder as your arm rises, by keeping your shoulder blade tucked down and toward your mid-back spine.  Hold for __________ seconds. Release the stretch, by lowering your healthy hand. Repeat __________ times. Complete this exercise __________ times per day. ROM - Internal Rotation   Using an underhand grip, grasp a stick behind your back with both hands.  While  standing upright with good posture, slide the stick up your back until you feel a mild stretch in the front of your shoulder.  Hold for __________ seconds. Slowly return to your starting position. Repeat __________ times. Complete this exercise __________ times per day.  STRETCH - Posterior Shoulder Capsule   Stand or sit with good posture. Grasp your right / left elbow and draw it across your chest, keeping it at the same height as your shoulder.  Pull your elbow, so your upper arm comes in closer to your chest. Pull until you feel a gentle stretch in the back of your shoulder.  Hold for __________ seconds. Repeat __________ times. Complete this exercise __________ times per day. STRENGTHENING EXERCISES - Impingement Syndrome (Rotator Cuff Tendinitis, Bursitis) These exercises may help you when beginning to rehabilitate your injury. They may resolve your symptoms with or without further involvement from your physician, physical therapist or athletic trainer. While completing these exercises, remember:  Muscles can gain both the endurance and the strength needed for everyday activities through controlled exercises.  Complete these exercises as instructed by your physician, physical therapist or athletic trainer. Increase the resistance and repetitions only as guided.  You may experience muscle soreness or fatigue, but the pain or discomfort you are trying to eliminate should never worsen during these exercises. If this pain does get worse, stop and make sure you are following the directions exactly. If the pain is still present after adjustments, discontinue the exercise until you can discuss the trouble with your clinician.  During your recovery, avoid activity or exercises which involve actions that place your injured hand or elbow above your head or behind your back or head. These positions stress the tissues which you are trying to heal. STRENGTH - Scapular Depression and Adduction   With  good posture, sit on a firm chair. Support your arms in front of you, with pillows, arm rests, or on a table top. Have your elbows in line with the sides of your body.  Gently draw your shoulder blades down and toward your mid-back spine. Gradually increase the tension, without tensing the muscles along the top of your shoulders and the back of your neck.  Hold for __________ seconds. Slowly release the tension and relax your muscles completely before starting the next repetition.  After you have practiced this exercise, remove the arm support and complete the exercise in standing as well as sitting position. Repeat __________ times. Complete this exercise __________ times per day.  STRENGTH - Shoulder Abductors, Isometric  With good posture, stand or sit about 4-6 inches from a wall, with your right / left side facing the wall.  Bend your right / left elbow. Gently press your right / left elbow into the wall. Increase the pressure gradually, until you are pressing as hard as you can, without shrugging your shoulder or increasing any shoulder discomfort.  Hold for __________ seconds.  Release the tension slowly. Relax your shoulder muscles completely before you begin the next repetition. Repeat __________ times. Complete this exercise __________ times per day.  STRENGTH - External Rotators, Isometric  Keep your right / left elbow at your side and bend it 90 degrees.  Step into a door frame so that the outside of your right / left wrist can press against the door frame without your upper arm leaving your side.  Gently press your right / left wrist into the door frame, as if you were trying to swing the back of your hand away from your stomach. Gradually increase the tension, until you are pressing as hard as you can, without shrugging your shoulder or increasing any shoulder discomfort.  Hold for __________ seconds.  Release the tension slowly. Relax your shoulder muscles completely before  you begin the next repetition. Repeat __________ times. Complete this exercise __________ times per day.  STRENGTH - Supraspinatus   Stand or sit with good posture. Grasp a __________ weight, or an exercise band or tubing, so that your hand is "thumbs-up," like you are shaking hands.  Slowly lift your right / left arm in a "V" away from your thigh, diagonally into the space between your side and straight ahead. Lift your hand to shoulder height or as far as you can, without increasing any shoulder pain. At first, many people do not lift their hands above shoulder height.  Avoid shrugging your right / left shoulder as your arm rises, by keeping your shoulder blade tucked down and toward your mid-back spine.  Hold for __________ seconds. Control the descent of your hand, as you  slowly return to your starting position. Repeat __________ times. Complete this exercise __________ times per day.  STRENGTH - External Rotators  Secure a rubber exercise band or tubing to a fixed object (table, pole) so that it is at the same height as your right / left elbow when you are standing or sitting on a firm surface.  Stand or sit so that the secured exercise band is at your uninjured side.  Bend your right / left elbow 90 degrees. Place a folded towel or small pillow under your right / left arm, so that your elbow is a few inches away from your side.  Keeping the tension on the exercise band, pull it away from your body, as if pivoting on your elbow. Be sure to keep your body steady, so that the movement is coming only from your rotating shoulder.  Hold for __________ seconds. Release the tension in a controlled manner, as you return to the starting position. Repeat __________ times. Complete this exercise __________ times per day.  STRENGTH - Internal Rotators   Secure a rubber exercise band or tubing to a fixed object (table, pole) so that it is at the same height as your right / left elbow when you are  standing or sitting on a firm surface.  Stand or sit so that the secured exercise band is at your right / left side.  Bend your elbow 90 degrees. Place a folded towel or small pillow under your right / left arm so that your elbow is a few inches away from your side.  Keeping the tension on the exercise band, pull it across your body, toward your stomach. Be sure to keep your body steady, so that the movement is coming only from your rotating shoulder.  Hold for __________ seconds. Release the tension in a controlled manner, as you return to the starting position. Repeat __________ times. Complete this exercise __________ times per day.  STRENGTH - Scapular Protractors, Standing   Stand arms length away from a wall. Place your hands on the wall, keeping your elbows straight.  Begin by dropping your shoulder blades down and toward your mid-back spine.  To strengthen your protractors, keep your shoulder blades down, but slide them forward on your rib cage. It will feel as if you are lifting the back of your rib cage away from the wall. This is a subtle motion and can be challenging to complete. Ask your caregiver for further instruction, if you are not sure you are doing the exercise correctly.  Hold for __________ seconds. Slowly return to the starting position, resting the muscles completely before starting the next repetition. Repeat __________ times. Complete this exercise __________ times per day. STRENGTH - Scapular Protractors, Supine  Lie on your back on a firm surface. Extend your right / left arm straight into the air while holding a __________ weight in your hand.  Keeping your head and back in place, lift your shoulder off the floor.  Hold for __________ seconds. Slowly return to the starting position, and allow your muscles to relax completely before starting the next repetition. Repeat __________ times. Complete this exercise __________ times per day. STRENGTH - Scapular  Protractors, Quadruped  Get onto your hands and knees, with your shoulders directly over your hands (or as close as you can be, comfortably).  Keeping your elbows locked, lift the back of your rib cage up into your shoulder blades, so your mid-back rounds out. Keep your neck muscles relaxed.  Hold this  position for __________ seconds. Slowly return to the starting position and allow your muscles to relax completely before starting the next repetition. Repeat __________ times. Complete this exercise __________ times per day.  STRENGTH - Scapular Retractors  Secure a rubber exercise band or tubing to a fixed object (table, pole), so that it is at the height of your shoulders when you are either standing, or sitting on a firm armless chair.  With a palm down grip, grasp an end of the band in each hand. Straighten your elbows and lift your hands straight in front of you, at shoulder height. Step back, away from the secured end of the band, until it becomes tense.  Squeezing your shoulder blades together, draw your elbows back toward your sides, as you bend them. Keep your upper arms lifted away from your body throughout the exercise.  Hold for __________ seconds. Slowly ease the tension on the band, as you reverse the directions and return to the starting position. Repeat __________ times. Complete this exercise __________ times per day. STRENGTH - Shoulder Extensors   Secure a rubber exercise band or tubing to a fixed object (table, pole) so that it is at the height of your shoulders when you are either standing, or sitting on a firm armless chair.  With a thumbs-up grip, grasp an end of the band in each hand. Straighten your elbows and lift your hands straight in front of you, at shoulder height. Step back, away from the secured end of the band, until it becomes tense.  Squeezing your shoulder blades together, pull your hands down to the sides of your thighs. Do not allow your hands to go  behind you.  Hold for __________ seconds. Slowly ease the tension on the band, as you reverse the directions and return to the starting position. Repeat __________ times. Complete this exercise __________ times per day.  STRENGTH - Scapular Retractors and External Rotators   Secure a rubber exercise band or tubing to a fixed object (table, pole) so that it is at the height as your shoulders, when you are either standing, or sitting on a firm armless chair.  With a palm down grip, grasp an end of the band in each hand. Bend your elbows 90 degrees and lift your elbows to shoulder height, at your sides. Step back, away from the secured end of the band, until it becomes tense.  Squeezing your shoulder blades together, rotate your shoulders so that your upper arms and elbows remain stationary, but your fists travel upward to head height.  Hold for __________ seconds. Slowly ease the tension on the band, as you reverse the directions and return to the starting position. Repeat __________ times. Complete this exercise __________ times per day.  STRENGTH - Scapular Retractors and External Rotators, Rowing   Secure a rubber exercise band or tubing to a fixed object (table, pole) so that it is at the height of your shoulders, when you are either standing, or sitting on a firm armless chair.  With a palm down grip, grasp an end of the band in each hand. Straighten your elbows and lift your hands straight in front of you, at shoulder height. Step back, away from the secured end of the band, until it becomes tense.  Step 1: Squeeze your shoulder blades together. Bending your elbows, draw your hands to your chest, as if you are rowing a boat. At the end of this motion, your hands and elbow should be at shoulder height and  your elbows should be out to your sides.  Step 2: Rotate your shoulders, to raise your hands above your head. Your forearms should be vertical and your upper arms should be  horizontal.  Hold for __________ seconds. Slowly ease the tension on the band, as you reverse the directions and return to the starting position. Repeat __________ times. Complete this exercise __________ times per day.  STRENGTH - Scapular Depressors  Find a sturdy chair without wheels, such as a dining room chair.  Keeping your feet on the floor, and your hands on the chair arms, lift your bottom up from the seat, and lock your elbows.  Keeping your elbows straight, allow gravity to pull your body weight down. Your shoulders will rise toward your ears.  Raise your body against gravity by drawing your shoulder blades down your back, shortening the distance between your shoulders and ears. Although your feet should always maintain contact with the floor, your feet should progressively support less body weight, as you get stronger.  Hold for __________ seconds. In a controlled and slow manner, lower your body weight to begin the next repetition. Repeat __________ times. Complete this exercise __________ times per day.    This information is not intended to replace advice given to you by your health care provider. Make sure you discuss any questions you have with your health care provider.   Document Released: 03/14/2005 Document Revised: 04/04/2014 Document Reviewed: 06/26/2008 Else[employer] Interactive Patient Education [employer].

## 2015-05-22 ENCOUNTER — Ambulatory Visit (INDEPENDENT_AMBULATORY_CARE_PROVIDER_SITE_OTHER)
Admission: RE | Admit: 2015-05-22 | Discharge: 2015-05-22 | Disposition: A | Discharge status: Home / Self Care | Payer: Commercial Managed Care - HMO | Source: Ambulatory Visit | Attending: Family Medicine | Admitting: Family Medicine

## 2015-05-22 DIAGNOSIS — R509 Fever, unspecified: Secondary | ICD-10-CM | POA: Diagnosis not present

## 2015-05-22 DIAGNOSIS — R05 Cough: Secondary | ICD-10-CM | POA: Diagnosis not present

## 2015-05-22 DIAGNOSIS — R059 Cough, unspecified: Secondary | ICD-10-CM

## 2015-05-22 DIAGNOSIS — M7580 Other shoulder lesions, unspecified shoulder: Secondary | ICD-10-CM | POA: Insufficient documentation

## 2015-05-22 DIAGNOSIS — J069 Acute upper respiratory infection, unspecified: Secondary | ICD-10-CM | POA: Insufficient documentation

## 2015-05-22 DIAGNOSIS — R0789 Other chest pain: Secondary | ICD-10-CM | POA: Insufficient documentation

## 2015-05-22 NOTE — Assessment & Plan Note (Signed)
Patient reports atypical central chest discomfort while eating. 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 I suspect that the discomfort was related to dysphasia and trying to eat too much at one time in a patient who was previously on a dysphagia 3 diet. Doubt cardiac cause given atypical nature and lack of shortness of breath, diaphoresis, and radiation and given short duration associated with eating. EKG showed known left bundle-branch block. Unlikely to be VTE given stable vital signs. Could be pulmonary and thus we will obtain chest x-ray though feel this is much less likely. Patient advised to limit how much he eats in one bite. Patient will continue to monitor. He is given return precautions.

## 2015-05-22 NOTE — Progress Notes (Signed)
Patient ID: James [name], male   DOB: 21-May-1965, 50 y.o.   MRN: [id number]  [name], MD Phone: [phone]  James [name] is a 50 y.o. male who presents today for follow-up.  Patient notes over the last week he has developed a sore throat and some coughing. Notes some postnasal drip. No sinus congestion or nasal congestion. Has come off oxygen. Cough is nonproductive. He has no shortness of breath with it. He does note earlier today he had an episode of chest discomfort while he was at [facility]. It occurred after he swallowed a hamburger and some Dr. Malachi Bonds at the same time. Occurred in the center of his chest and he is able to point to a specific spot. It was "just a hurt." There is no radiation, diaphoresis, or shortness of breath. It is not exertional. It resolved after he drank some more liquid. He felt it was indigestion. He has no chest pain or shortness of breath at this time.  Left shoulder pain: This has been going on for over a week. Notes discomfort only when he moves it internally or externally rotating. No injury. It is not exertional. No pain without moving it. He thinks it is due to how he is sleeping on the shoulder. He has been taking Aleve twice daily for this.  PMH: nonsmoker.   ROS the history of present illness  Objective  Physical Exam Filed Vitals:   05/21/15 1604  BP: 92/72  Pulse: 89  Temp: 97.5 F (36.4 C)    BP Readings from Last 3 Encounters:  05/21/15 92/72  04/13/15 96/62  04/07/15 104/61   Wt Readings from Last 3 Encounters:  05/21/15 199 lb 2 oz (90.323 kg)  04/13/15 195 lb 9.6 oz (88.724 kg)  04/01/15 195 lb 15.8 oz (88.9 kg)    Physical Exam  Constitutional: He is well-developed, well-nourished, and in no distress.  HENT:  Head: Normocephalic and atraumatic.  Right Ear: External ear normal.  Left Ear: External ear normal.  Mild posterior oropharyngeal erythema, no tonsillar exudates, normal TMs bilaterally  Eyes: Conjunctivae  are normal. Pupils are equal, round, and reactive to light.  Neck: Neck supple.  Cardiovascular: Normal rate, regular rhythm and normal heart sounds.  Exam reveals no gallop and no friction rub.   No murmur heard. Pulmonary/Chest: Effort normal. No respiratory distress. He has no wheezes. He has no rales. He exhibits no tenderness.  Few scattered crackles throughout  Musculoskeletal:  Left shoulder symmetrical with right, left shoulder with no tenderness to palpation or swelling, no warmth to the shoulder, there is discomfort on internal and external rotation, patient with full range of motion, no other discomfort in the shoulder with movement, negative empty can testing, right shoulder with no abnormalities  Lymphadenopathy:    He has no cervical adenopathy.  Neurological: He is alert. Gait normal.  5 out of 5 strength bilateral biceps, triceps, grip, sensation to light touch intact in bilateral upper extremities  Skin: Skin is warm and dry. He is not diaphoretic.  Psychiatric:  Flat affect   EKG: Sinus rhythm, rate 83, left bundle-branch block  Assessment/Plan: Please see individual problem list.  Rotator cuff tendinitis Patient shoulder discomfort is most consistent with rotator cuff issue. He is neurologically intact in his upper extremities. Hands are warm and well-perfused. 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 We will start with exercises to rehabilitate this at home. He can use heat as well. Given return precautions.  Acute upper respiratory infection Suspect viral upper  respiratory illness is the cause of his sore throat and cough, though could be allergies. Less likely to be related to pneumonia, though patient does have few scattered crackles in his lungs. Given his history of pneumonia and quick decompensation in the past with respiratory illnesses we will obtain a chest x-ray to evaluate this further. Discussed over-the-counter antihistamines for this issue. Patient will continue to  monitor. He is given return precautions.  Atypical chest pain Patient reports atypical central chest discomfort while eating. 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 I suspect that the discomfort was related to dysphasia and trying to eat too much at one time in a patient who was previously on a dysphagia 3 diet. Doubt cardiac cause given atypical nature and lack of shortness of breath, diaphoresis, and radiation and given short duration associated with eating. EKG showed known left bundle-branch block. Unlikely to be VTE given stable vital signs. Could be pulmonary and thus we will obtain chest x-ray though feel this is much less likely. Patient advised to limit how much he eats in one bite. Patient will continue to monitor. He is given return precautions.    Orders Placed This Encounter  Procedures  . DG Chest 2 View    Standing Status: Future     Number of Occurrences:      Standing Expiration Date: 07/18/2016    Order Specific Question:  Reason for Exam (SYMPTOM  OR DIAGNOSIS REQUIRED)    Answer:  cough, mild scattered crackles    Order Specific Question:  Preferred imaging location?    Answer:  [place]    [name]

## 2015-05-22 NOTE — Assessment & Plan Note (Signed)
Suspect viral upper respiratory illness is the cause of his sore throat and cough, though could be allergies. Less likely to be related to pneumonia, though patient does have few scattered crackles in his lungs. Given his history of pneumonia and quick decompensation in the past with respiratory illnesses we will obtain a chest x-ray to evaluate this further. Discussed over-the-counter antihistamines for this issue. Patient will continue to monitor. He is given return precautions.

## 2015-05-22 NOTE — Assessment & Plan Note (Signed)
Patient shoulder discomfort is most consistent with rotator cuff issue. He is neurologically intact in his upper extremities. Hands are warm and well-perfused. 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 We will start with exercises to rehabilitate this at home. He can use heat as well. Given return precautions.

## 2015-05-27 DIAGNOSIS — J9611 Chronic respiratory failure with hypoxia: Secondary | ICD-10-CM | POA: Diagnosis not present

## 2015-05-27 DIAGNOSIS — J189 Pneumonia, unspecified organism: Secondary | ICD-10-CM | POA: Diagnosis not present

## 2015-05-27 DIAGNOSIS — G7111 Myotonic muscular dystrophy: Secondary | ICD-10-CM | POA: Diagnosis not present

## 2015-05-27 DIAGNOSIS — K219 Gastro-esophageal reflux disease without esophagitis: Secondary | ICD-10-CM | POA: Diagnosis not present

## 2015-05-27 DIAGNOSIS — I447 Left bundle-branch block, unspecified: Secondary | ICD-10-CM | POA: Diagnosis not present

## 2015-05-27 DIAGNOSIS — R1310 Dysphagia, unspecified: Secondary | ICD-10-CM | POA: Diagnosis not present

## 2015-05-27 DIAGNOSIS — I429 Cardiomyopathy, unspecified: Secondary | ICD-10-CM | POA: Diagnosis not present

## 2015-05-27 DIAGNOSIS — Z9981 Dependence on supplemental oxygen: Secondary | ICD-10-CM | POA: Diagnosis not present

## 2015-06-03 DIAGNOSIS — R1310 Dysphagia, unspecified: Secondary | ICD-10-CM | POA: Diagnosis not present

## 2015-06-03 DIAGNOSIS — J189 Pneumonia, unspecified organism: Secondary | ICD-10-CM | POA: Diagnosis not present

## 2015-06-03 DIAGNOSIS — K219 Gastro-esophageal reflux disease without esophagitis: Secondary | ICD-10-CM | POA: Diagnosis not present

## 2015-06-03 DIAGNOSIS — I447 Left bundle-branch block, unspecified: Secondary | ICD-10-CM | POA: Diagnosis not present

## 2015-06-03 DIAGNOSIS — G7111 Myotonic muscular dystrophy: Secondary | ICD-10-CM | POA: Diagnosis not present

## 2015-06-03 DIAGNOSIS — J9611 Chronic respiratory failure with hypoxia: Secondary | ICD-10-CM | POA: Diagnosis not present

## 2015-06-03 DIAGNOSIS — Z9981 Dependence on supplemental oxygen: Secondary | ICD-10-CM | POA: Diagnosis not present

## 2015-06-03 DIAGNOSIS — I429 Cardiomyopathy, unspecified: Secondary | ICD-10-CM | POA: Diagnosis not present

## 2015-06-05 DIAGNOSIS — K219 Gastro-esophageal reflux disease without esophagitis: Secondary | ICD-10-CM | POA: Diagnosis not present

## 2015-06-05 DIAGNOSIS — R1310 Dysphagia, unspecified: Secondary | ICD-10-CM | POA: Diagnosis not present

## 2015-06-05 DIAGNOSIS — J9611 Chronic respiratory failure with hypoxia: Secondary | ICD-10-CM | POA: Diagnosis not present

## 2015-06-05 DIAGNOSIS — J189 Pneumonia, unspecified organism: Secondary | ICD-10-CM | POA: Diagnosis not present

## 2015-06-05 DIAGNOSIS — J129 Viral pneumonia, unspecified: Secondary | ICD-10-CM | POA: Diagnosis not present

## 2015-06-05 DIAGNOSIS — I429 Cardiomyopathy, unspecified: Secondary | ICD-10-CM | POA: Diagnosis not present

## 2015-06-05 DIAGNOSIS — G7111 Myotonic muscular dystrophy: Secondary | ICD-10-CM | POA: Diagnosis not present

## 2015-06-05 DIAGNOSIS — Z9981 Dependence on supplemental oxygen: Secondary | ICD-10-CM | POA: Diagnosis not present

## 2015-06-05 DIAGNOSIS — I447 Left bundle-branch block, unspecified: Secondary | ICD-10-CM | POA: Diagnosis not present

## 2015-06-05 DIAGNOSIS — G710 Muscular dystrophy: Secondary | ICD-10-CM | POA: Diagnosis not present

## 2015-06-23 ENCOUNTER — Ambulatory Visit: Payer: Commercial Managed Care - HMO | Attending: Specialist

## 2015-06-23 DIAGNOSIS — G4761 Periodic limb movement disorder: Secondary | ICD-10-CM | POA: Insufficient documentation

## 2015-06-23 DIAGNOSIS — R0683 Snoring: Secondary | ICD-10-CM | POA: Diagnosis present

## 2015-06-23 DIAGNOSIS — G4733 Obstructive sleep apnea (adult) (pediatric): Secondary | ICD-10-CM | POA: Insufficient documentation

## 2015-06-24 ENCOUNTER — Ambulatory Visit: Payer: Medicare Other | Admitting: Family Medicine

## 2015-06-26 ENCOUNTER — Encounter: Payer: Self-pay | Admitting: Family Medicine

## 2015-06-26 ENCOUNTER — Ambulatory Visit (INDEPENDENT_AMBULATORY_CARE_PROVIDER_SITE_OTHER): Payer: Commercial Managed Care - HMO | Admitting: Family Medicine

## 2015-06-26 VITALS — BP 110/74 | HR 76 | Temp 98.0°F | Wt 204.4 lb

## 2015-06-26 DIAGNOSIS — M7582 Other shoulder lesions, left shoulder: Secondary | ICD-10-CM

## 2015-06-26 DIAGNOSIS — N529 Male erectile dysfunction, unspecified: Secondary | ICD-10-CM

## 2015-06-26 DIAGNOSIS — G4710 Hypersomnia, unspecified: Secondary | ICD-10-CM | POA: Diagnosis not present

## 2015-06-26 DIAGNOSIS — G4733 Obstructive sleep apnea (adult) (pediatric): Secondary | ICD-10-CM | POA: Insufficient documentation

## 2015-06-26 DIAGNOSIS — I429 Cardiomyopathy, unspecified: Secondary | ICD-10-CM

## 2015-06-26 NOTE — Assessment & Plan Note (Addendum)
Likely rotator cuff tendinitis. Neurologically intact in his upper extremities. He has not done the exercises provided to him at the last visit. I discussed options of home exercises, PT, or injection. patient opted for home exercises. If he does not do these he will let us know and we'll refer to PT.

## 2015-06-26 NOTE — Assessment & Plan Note (Signed)
Appears stable at this time. Asymptomatic. Patient will continue to monitor and follow with cardiology. Given return precautions.

## 2015-06-26 NOTE — Patient Instructions (Signed)
Nice to see you. You're doing well. Please do the exercises for your left shoulder. Please follow up with her pulmonologist for your sleep study results. If you would like a referral to urology please let us know. If you develop chest pain, shortness of breath, swelling, numbness, weakness, or any new or changing symptoms please seek medical attention.

## 2015-06-26 NOTE — Assessment & Plan Note (Signed)
Patient reports hypersomnia with possible sleep apnea. Has seen pulmonology for this. Recently he underwent a sleep study and is awaiting results.

## 2015-06-26 NOTE — Progress Notes (Signed)
Patient ID: James [name], male   DOB: [birth year], 50 y.o.   MRN: [id number]  [name], MD Phone: [phone]  James [name] is a 50 y.o. male who presents today for follow-up.   Left rotator cuff tendinitis: Patient notes left shoulder is still bothering him. Notes it mostly hurts when he sleeps on it or when he externally rotates it. He has not been doing the exercises that were provided. Is not taking any medicines for this. there is no numbness or weakness in his arms.  Cardiomyopathy: Patient followed by cardiology for this. No complains of shortness of breath, chest pain, or edema. He is not currently on any medications for this. Blood pressure has been stable.  Possible sleep apnea: Patient notes he snores and does have daytime sleepiness. Was recently seen pulmonology who ordered a sleep study that was performed this last week. He has not received the results yet.  Erectile dysfunction. Patient notes he is able to attain an erection though it is not as erect as previously. Previously on Cialis though this is not very beneficial. He has seen urology in the past for this.  PMH: nonsmoker.   ROS see history of present illness  Objective  Physical Exam Filed Vitals:   06/26/15 0906  BP: 110/74  Pulse: 76  Temp: 98 F (36.7 C)    Physical Exam  Constitutional: No distress.  HENT:  Head: Normocephalic and atraumatic.  Cardiovascular: Normal rate, regular rhythm and normal heart sounds.   Pulmonary/Chest: Effort normal and breath sounds normal.  Musculoskeletal: He exhibits no edema.  Left shoulder with no tenderness to palpation, full range of motion, there is discomfort on external range of motion, no discomfort on internal range of motion, negative empty can test Right shoulder with no tenderness palpation, there is full range of motion with no discomfort, negative empty can test  Neurological: He is alert.  5 out of 5 biceps, triceps, and deltoid strength,  sensation light touch intact in bilateral upper extremities, hands warm and well perfused  Skin: Skin is warm and dry. He is not diaphoretic.     Assessment/Plan: Please see individual problem list.  Cardiomyopathy Appears stable at this time. Asymptomatic. Patient will continue to monitor and follow with cardiology. Given return precautions.  Erectile dysfunction Continues to be an issue. Discussed referral to urology for further evaluation given his history of hypogonadism and his cardiac history. Do not think oral phosphodiesterase inhibitors would be a great option given his blood pressure and cardiac history. He states he will think on this referral.  Rotator cuff tendinitis Likely rotator cuff tendinitis. Neurologically intact in his upper extremities. He has not done the exercises provided to him at the last visit. I discussed options of home exercises, PT, or injection. patient opted for home exercises. If he does not do these he will let us know and we'll refer to PT.  Hypersomnia Patient reports hypersomnia with possible sleep apnea. Has seen pulmonology for this. Recently he underwent a sleep study and is awaiting results.    [name], MD [place]

## 2015-06-26 NOTE — Progress Notes (Signed)
Pre visit review using our clinic review tool, if applicable. No additional management support is needed unless otherwise documented below in the visit note. 

## 2015-06-26 NOTE — Assessment & Plan Note (Addendum)
Continues to be an issue. Discussed referral to urology for further evaluation given his history of hypogonadism and his cardiac history. Do not think oral phosphodiesterase inhibitors would be a great option given his blood pressure and cardiac history. He states he will think on this referral.

## 2015-07-02 ENCOUNTER — Ambulatory Visit: Payer: Commercial Managed Care - HMO | Attending: Specialist

## 2015-07-02 DIAGNOSIS — G4733 Obstructive sleep apnea (adult) (pediatric): Secondary | ICD-10-CM | POA: Insufficient documentation

## 2015-07-02 DIAGNOSIS — G4761 Periodic limb movement disorder: Secondary | ICD-10-CM | POA: Diagnosis not present

## 2015-07-06 DIAGNOSIS — J9611 Chronic respiratory failure with hypoxia: Secondary | ICD-10-CM | POA: Diagnosis not present

## 2015-07-06 DIAGNOSIS — G710 Muscular dystrophy: Secondary | ICD-10-CM | POA: Diagnosis not present

## 2015-07-06 DIAGNOSIS — J129 Viral pneumonia, unspecified: Secondary | ICD-10-CM | POA: Diagnosis not present

## 2015-07-06 DIAGNOSIS — J189 Pneumonia, unspecified organism: Secondary | ICD-10-CM | POA: Diagnosis not present

## 2015-07-10 DIAGNOSIS — G7111 Myotonic muscular dystrophy: Secondary | ICD-10-CM | POA: Diagnosis not present

## 2015-07-10 DIAGNOSIS — R262 Difficulty in walking, not elsewhere classified: Secondary | ICD-10-CM | POA: Diagnosis not present

## 2015-07-10 DIAGNOSIS — M6281 Muscle weakness (generalized): Secondary | ICD-10-CM | POA: Diagnosis not present

## 2015-08-06 DIAGNOSIS — G4733 Obstructive sleep apnea (adult) (pediatric): Secondary | ICD-10-CM | POA: Diagnosis not present

## 2015-08-06 DIAGNOSIS — G7111 Myotonic muscular dystrophy: Secondary | ICD-10-CM | POA: Diagnosis not present

## 2015-09-06 DIAGNOSIS — G4733 Obstructive sleep apnea (adult) (pediatric): Secondary | ICD-10-CM | POA: Diagnosis not present

## 2015-09-06 DIAGNOSIS — G7111 Myotonic muscular dystrophy: Secondary | ICD-10-CM | POA: Diagnosis not present

## 2015-09-10 DIAGNOSIS — G4733 Obstructive sleep apnea (adult) (pediatric): Secondary | ICD-10-CM | POA: Diagnosis not present

## 2015-09-10 DIAGNOSIS — R0609 Other forms of dyspnea: Secondary | ICD-10-CM | POA: Diagnosis not present

## 2015-09-10 DIAGNOSIS — G7111 Myotonic muscular dystrophy: Secondary | ICD-10-CM | POA: Diagnosis not present

## 2015-09-25 ENCOUNTER — Ambulatory Visit (INDEPENDENT_AMBULATORY_CARE_PROVIDER_SITE_OTHER): Payer: Commercial Managed Care - HMO | Admitting: Family Medicine

## 2015-09-25 ENCOUNTER — Encounter: Payer: Self-pay | Admitting: Family Medicine

## 2015-09-25 VITALS — BP 112/70 | HR 60 | Temp 98.2°F | Ht 71.0 in | Wt 204.0 lb

## 2015-09-25 DIAGNOSIS — I429 Cardiomyopathy, unspecified: Secondary | ICD-10-CM | POA: Diagnosis not present

## 2015-09-25 DIAGNOSIS — R002 Palpitations: Secondary | ICD-10-CM | POA: Diagnosis not present

## 2015-09-25 DIAGNOSIS — G4733 Obstructive sleep apnea (adult) (pediatric): Secondary | ICD-10-CM

## 2015-09-25 LAB — CBC
HEMATOCRIT: 44.2 % (ref 39.0–52.0)
HEMOGLOBIN: 14.8 g/dL (ref 13.0–17.0)
MCHC: 33.4 g/dL (ref 30.0–36.0)
MCV: 94.2 fl (ref 78.0–100.0)
PLATELETS: 175 10*3/uL (ref 150.0–400.0)
RBC: 4.7 Mil/uL (ref 4.22–5.81)
RDW: 14.2 % (ref 11.5–15.5)
WBC: 6.3 10*3/uL (ref 4.0–10.5)

## 2015-09-25 LAB — TSH: TSH: 1.21 u[IU]/mL (ref 0.35–4.50)

## 2015-09-25 LAB — COMPREHENSIVE METABOLIC PANEL
ALT: 35 U/L (ref 0–53)
AST: 23 U/L (ref 0–37)
Albumin: 3.9 g/dL (ref 3.5–5.2)
Alkaline Phosphatase: 58 U/L (ref 39–117)
BILIRUBIN TOTAL: 1.3 mg/dL — AB (ref 0.2–1.2)
BUN: 14 mg/dL (ref 6–23)
CHLORIDE: 109 meq/L (ref 96–112)
CO2: 31 meq/L (ref 19–32)
Calcium: 9.2 mg/dL (ref 8.4–10.5)
Creatinine, Ser: 0.9 mg/dL (ref 0.40–1.50)
GFR: 95.06 mL/min (ref >60.00)
GLUCOSE: 81 mg/dL (ref 70–99)
Potassium: 4 mEq/L (ref 3.5–5.1)
Sodium: 143 mEq/L (ref 135–145)
Total Protein: 6.8 g/dL (ref 6.0–8.3)

## 2015-09-25 NOTE — Progress Notes (Signed)
Patient ID: James [name], male   DOB: 07-08-1965, 50 y.o.   MRN: [id number]  James Rumps, MD Phone: [phone]  ARIEN [name] is a 50 y.o. male who presents today for follow-up.  Palpitations: Patient notes her last several months he has had several occasions where he has felt palpitations. Last for a couple minutes and resolve on their own. Usually just sitting there. Is possibly previously had this a number of years ago. No chest pain or shortness of breath with this. No orthopnea. No swelling. No significant weight gain. He has a history of cardiomyopathy and known left bundle branch block. No increased caffeine intake.  Sleep apnea: Reports sleep study revealed sleep apnea. He is currently on BiPAP. Wife reports this is helped him to have better sleep and he appears more restful while sleeping though has not helped his daytime sleepiness that is likely due to his myotonic dystrophy. He is not moving around at night as much.  PMH: nonsmoker.   ROS see history of present illness  Objective  Physical Exam Filed Vitals:   09/25/15 0955  BP: 112/70  Pulse: 60  Temp: 98.2 F (36.8 C)    BP Readings from Last 3 Encounters:  09/25/15 112/70  06/26/15 110/74  05/21/15 92/72   Wt Readings from Last 3 Encounters:  09/25/15 204 lb (92.534 kg)  06/26/15 204 lb 6.4 oz (92.715 kg)  05/21/15 199 lb 2 oz (90.323 kg)    Physical Exam  Constitutional: No distress.  HENT:  Head: Normocephalic and atraumatic.  Cardiovascular: Normal rate, regular rhythm and normal heart sounds.   Pulmonary/Chest: Effort normal and breath sounds normal.  Musculoskeletal: He exhibits no edema.  Neurological: He is alert. Gait normal.  Skin: Skin is warm and dry. He is not diaphoretic.   EKG: Sinus bradycardia, rate 51, first-degree AV block, left bundle branch block, similar to prior EKGs with exception of inverted T wave in lead 3 which is nonspecific  Assessment/Plan: Please see individual  problem list.  Obstructive sleep apnea Recently diagnosed. On BiPAP now. Appears to be sleeping better. Doubt we will be able to base improvement off of daytime sleepiness given his myotonic dystrophy. He'll continue BiPAP and continue to monitor.  Palpitations New issue. No associated chest pain or shortness of breath. No signs of volume overload. Weight is stable. Does have a known left bundle branch block. EKG performed today revealed first-degree AV block, left bundle-branch block, both have been seen previously. Lab work to be obtained as outlined below. Suggest follow-up with cardiology sooner than scheduled appointment in October. Patient and wife will call to set up appointment. Advised that if they need our assistance in this let us know. Given return precautions.  Cardiomyopathy Stable on current medications. Weight has been stable. No orthopnea or edema. Continue current medications. Continue follow-up with cardiology.    Orders Placed This Encounter  Procedures  . Comp Met (CMET)  . TSH  . CBC  . EKG 12-Lead    James Rumps, MD [place]

## 2015-09-25 NOTE — Progress Notes (Signed)
Pre visit review using our clinic review tool, if applicable. No additional management support is needed unless otherwise documented below in the visit note. 

## 2015-09-25 NOTE — Assessment & Plan Note (Addendum)
Stable on current medications. Weight has been stable. No orthopnea or edema. Continue current medications. Continue follow-up with cardiology.

## 2015-09-25 NOTE — Assessment & Plan Note (Addendum)
New issue. No associated chest pain or shortness of breath. No signs of volume overload. Weight is stable. Does have a known left bundle branch block. EKG performed today revealed first-degree AV block, left bundle-branch block, both have been seen previously. Lab work to be obtained as outlined below. Suggest follow-up with cardiology sooner than scheduled appointment in October. Patient and wife will call to set up appointment. Advised that if they need our assistance in this let us know. Given return precautions.

## 2015-09-25 NOTE — Assessment & Plan Note (Signed)
Recently diagnosed. On BiPAP now. Appears to be sleeping better. Doubt we will be able to base improvement off of daytime sleepiness given his myotonic dystrophy. He'll continue BiPAP and continue to monitor.

## 2015-09-25 NOTE — Patient Instructions (Signed)
Nice to see you. We are going to obtain some lab work to evaluate your palpitations. You should call and schedule an earlier appointment with your cardiologist. Please continue to use your BiPAP. If you develop chest pain, shortness of breath, persistent palpitations, weight gain more than 3 pounds in 1 day or more than 5 pounds in 1 week, or any new or changing symptoms please seek medical attention.

## 2015-10-06 DIAGNOSIS — G4733 Obstructive sleep apnea (adult) (pediatric): Secondary | ICD-10-CM | POA: Diagnosis not present

## 2015-10-06 DIAGNOSIS — G7111 Myotonic muscular dystrophy: Secondary | ICD-10-CM | POA: Diagnosis not present

## 2015-10-20 ENCOUNTER — Emergency Department: Payer: Commercial Managed Care - HMO

## 2015-10-20 ENCOUNTER — Encounter: Payer: Self-pay | Admitting: Emergency Medicine

## 2015-10-20 ENCOUNTER — Emergency Department
Admission: EM | Admit: 2015-10-20 | Discharge: 2015-10-20 | Disposition: A | Discharge status: Home / Self Care | Payer: Commercial Managed Care - HMO | Attending: Emergency Medicine | Admitting: Emergency Medicine

## 2015-10-20 DIAGNOSIS — R112 Nausea with vomiting, unspecified: Secondary | ICD-10-CM | POA: Diagnosis not present

## 2015-10-20 DIAGNOSIS — Z8679 Personal history of other diseases of the circulatory system: Secondary | ICD-10-CM | POA: Diagnosis not present

## 2015-10-20 DIAGNOSIS — R0789 Other chest pain: Secondary | ICD-10-CM | POA: Diagnosis not present

## 2015-10-20 DIAGNOSIS — R079 Chest pain, unspecified: Secondary | ICD-10-CM | POA: Diagnosis not present

## 2015-10-20 DIAGNOSIS — Z7951 Long term (current) use of inhaled steroids: Secondary | ICD-10-CM | POA: Insufficient documentation

## 2015-10-20 DIAGNOSIS — Z79899 Other long term (current) drug therapy: Secondary | ICD-10-CM | POA: Insufficient documentation

## 2015-10-20 LAB — CBC
HEMATOCRIT: 48.1 % (ref 40.0–52.0)
HEMOGLOBIN: 16.5 g/dL (ref 13.0–18.0)
MCH: 32 pg (ref 26.0–34.0)
MCHC: 34.3 g/dL (ref 32.0–36.0)
MCV: 93.4 fL (ref 80.0–100.0)
Platelets: 162 10*3/uL (ref 150–440)
RBC: 5.15 MIL/uL (ref 4.40–5.90)
RDW: 14 % (ref 11.5–14.5)
WBC: 7.9 10*3/uL (ref 3.8–10.6)

## 2015-10-20 LAB — BASIC METABOLIC PANEL
ANION GAP: 7 (ref 5–15)
BUN: 10 mg/dL (ref 6–20)
CALCIUM: 9.4 mg/dL (ref 8.9–10.3)
CO2: 29 mmol/L (ref 22–32)
Chloride: 107 mmol/L (ref 101–111)
Creatinine, Ser: 1 mg/dL (ref 0.61–1.24)
GFR calc Af Amer: >60 mL/min (ref >60)
Glucose, Bld: 163 mg/dL — ABNORMAL HIGH (ref 65–99)
POTASSIUM: 3.3 mmol/L — AB (ref 3.5–5.1)
SODIUM: 143 mmol/L (ref 135–145)

## 2015-10-20 LAB — TROPONIN I

## 2015-10-20 MED ORDER — ONDANSETRON 4 MG PO TBDP: ORAL_TABLET | ORAL | 0 refills | Status: DC

## 2015-10-20 MED ORDER — ONDANSETRON HCL 4 MG/2ML IJ SOLN
4.0000 mg | INTRAMUSCULAR | Status: AC
  Administered 2015-10-20: 4 mg via INTRAVENOUS
  Filled 2015-10-20: qty 2

## 2015-10-20 MED ORDER — OXYCODONE-ACETAMINOPHEN 5-325 MG PO TABS
2.0000 | ORAL_TABLET | Freq: Once | ORAL | Status: AC
  Administered 2015-10-20: 2 via ORAL
  Filled 2015-10-20: qty 2

## 2015-10-20 NOTE — ED Provider Notes (Signed)
[facility] Emergency Department Provider Note  ____________________________________________  Time seen: Approximately 3:48 PM  I have reviewed the triage vital signs and the nursing notes.   HISTORY  Chief Complaint Chest Pain    HPI James [name] is a 50 y.o. male the history that includes cardiomyopathy and muscular dystrophy who receives all of his care at [facility] who presents for evaluation of left-sided chest pain.  He reports that it started acutely after he stumbled and somewhat collapsed onto the toilet earlier today.  He was not having the chest pain previously.  After he settled down onto the toilet and suddenly he had mild to moderate aching pain in the left side of his chest.  He describes it as an aching pain located beneath his left nipple and occasionally radiating through to his back.  Nothing in particular makes it better nor worse.  He did not have a fall, did not strike his head, did not strike his chest or ribs.  After he got up he took some ibuprofen at which point he vomited and then became sweaty and flushed.  His family convinced him to come to the emergency department for further evaluation.  His family reports that he does occasionally have chest pain but that the different symptom today was the diaphoresis.  He denies fever/chills, shortness of breath, abdominal pain, dysuria.  He has no acute focal neurological deficits.  He has no headache and no visual changes.     Past Medical History:  Diagnosis Date  . Cardiomyopathy ([place])   . GERD (gastroesophageal reflux disease)   . History of chicken pox   . History of pneumonia 2013   double PNA at [facility]  . LBBB (left bundle branch block)   . Myotonic dystrophy, type 1 ([place])    followed by [facility]    Patient Active Problem List   Diagnosis Date Noted  . Palpitations 09/25/2015  . Obstructive sleep apnea 06/26/2015  . Rotator cuff tendinitis 05/22/2015  . Acute upper respiratory  infection 05/22/2015  . Atypical chest pain 05/22/2015  . Dysphagia   . Acute respiratory insufficiency 03/30/2015  . Erectile dysfunction 06/01/2013  . Hypogonadism male 06/01/2013  . GERD (gastroesophageal reflux disease)   . Cardiomyopathy ([place])   . LBBB (left bundle branch block)   . Myotonic dystrophy, type 1 ([place])     Past Surgical History:  Procedure Laterality Date  . US ECHOCARDIOGRAPHY  09/2010   EF 50%, mld LVH and dysfunction, mild RV dysfunction and enlarged    Current Outpatient Rx  . Order #: [id number] Class: Historical Med  . Order #: [id number] Class: Normal  . Order #: [id number] Class: Historical Med  . Order #: [id number] Class: Historical Med  . Order #: [id number] Class: No Print  . Order #: [id number] Class: Historical Med  . Order #: [id number] Class: Print    Allergies Codeine  Family History  Problem Relation Age of Onset  . Hypertension Father   . Cancer Paternal Grandfather     lung, smoker  . Cancer Paternal Uncle     bone  . Diabetes Mother   . CAD Mother 8    MI  . Stroke Neg Hx     Social History Social History  Substance Use Topics  . Smoking status: Never Smoker  . Smokeless tobacco: Never Used  . Alcohol use No    Review of Systems Constitutional: No fever/chills Eyes: No visual changes. ENT: No sore throat. Cardiovascular: +chest pain. Respiratory: Denies shortness of breath.  Gastrointestinal: No abdominal pain.  Emesis 1 with diaphoresis.  No diarrhea.  No constipation. Genitourinary: Negative for dysuria. Musculoskeletal: Negative for back pain. Skin: Negative for rash. Neurological: Negative for headaches, focal weakness or numbness.  10-point ROS otherwise negative.  ____________________________________________   PHYSICAL EXAM:  VITAL SIGNS: ED Triage Vitals  Enc Vitals Group     BP 10/20/15 1223 127/80     Pulse Rate 10/20/15 1223 75     Resp 10/20/15 1223 20     Temp 10/20/15 1223 98.6 F (37 C)     Temp  Source 10/20/15 1223 Oral     SpO2 10/20/15 1223 100 %     Weight 10/20/15 1223 200 lb (90.7 kg)     Height 10/20/15 1223 5\' 9"  (1.753 m)     Head Circumference --      Peak Flow --      Pain Score 10/20/15 1224 6     Pain Loc --      Pain Edu? --      Excl. in [place]? --     Constitutional: Alert and oriented. Has the appearance of chronic illness but is not in acute distress Eyes: Conjunctivae are normal. PERRL. EOMI. Head: Atraumatic. Nose: No congestion/rhinnorhea. Mouth/Throat: Mucous membranes are moist.  Oropharynx non-erythematous. Neck: No stridor.  No meningeal signs.  No cervical spine tenderness to palpation. Cardiovascular: Normal rate, regular rhythm. Good peripheral circulation. Grossly normal heart sounds.  No reproducible chest wall tenderness Respiratory: Normal respiratory effort.  No retractions. Lungs CTAB. Gastrointestinal: Soft and nontender. No distention.  Musculoskeletal: No lower extremity tenderness nor edema. No gross deformities of extremities. Neurologic:  Normal speech and language. No gross focal neurologic deficits are appreciated.  Skin:  Skin is warm, dry and intact. No rash noted.  ____________________________________________   LABS (all labs ordered are listed, but only abnormal results are displayed)  Labs Reviewed  BASIC METABOLIC PANEL - Abnormal; Notable for the following:       Result Value   Potassium 3.3 (*)    Glucose, Bld 163 (*)    All other components within normal limits  CBC  TROPONIN I  TROPONIN I   ____________________________________________  EKG  ED ECG REPORT I, Taniela Feltus, the attending physician, personally viewed and interpreted this ECG.  Date: 10/20/2015 EKG Time: 12:21 Rate: 67 Rhythm: normal sinus rhythm with first-degree AV block QRS Axis: normal Intervals: Left bundle branch block (chronic), PR interval is 240 ms ST/T Wave abnormalities: normal Conduction Disturbances: none Narrative Interpretation:  Non-specific ST segment / T-wave changes, but no evidence of acute ischemia.   ____________________________________________  RADIOLOGY   Dg Chest 2 View  Result Date: 10/20/2015 CLINICAL DATA:  Chest pain. EXAM: CHEST  2 VIEW COMPARISON:  May 22, 2015 FINDINGS: There is patchy bibasilar atelectasis. Lungs elsewhere are clear. Heart size and pulmonary vascularity are normal. No adenopathy. No bone lesions. IMPRESSION: Patchy bibasilar atelectasis.  No edema or consolidation. Electronically Signed   By: Lowella Grip III M.D.   On: 10/20/2015 13:21   ____________________________________________   PROCEDURES  Procedure(s) performed:   Procedures   ____________________________________________   INITIAL IMPRESSION / ASSESSMENT AND PLAN / ED COURSE  Pertinent labs & imaging results that were available during my care of the patient were reviewed by me and considered in my medical decision making (see chart for details).  Most likely the patient suffered a musculoskeletal chest wall strain due to collapsing on the toilet which started the chest pain.  After he vomited up the ibuprofen that led to the diaphoresis and flushing which has since resolved.  He still has the chest pain and it is not reproducible with chest wall palpation.  However his first set of labs are reassuring.  His EKG is nonacute.  I discussed with the patient and his wife the plan to check a second troponin and have him follow-up as an outpatient if the troponin is unremarkable.  They understand and agree with this plan.  I am giving him some Zofran and Percocet for pain relief and nausea.  Clinical Course  Comment By Time  Patient's symptoms are well controlled at this time.  His second troponin was negative and his workup was generally unremarkable.  I discussed the fact that this is most likely musculoskeletal with him but we cannot rule out the possibility, though low, of ACS.  We briefly discussed the  possibility of calling [facility] for a transfer, but we all agreed that that was not necessary.  He is at low enough risk of ACS that we agreed that he is appropriate for close outpatient follow-up.  He does not appear to have any acute or emergent medical condition at this time.  I gave my usual and customary return precautions. Hinda Kehr, MD 07/25 1842    ____________________________________________  FINAL CLINICAL IMPRESSION(S) / ED DIAGNOSES  Final diagnoses:  Nonspecific chest pain  Non-intractable vomiting with nausea, vomiting of unspecified type     MEDICATIONS GIVEN DURING THIS VISIT:  Medications  ondansetron (ZOFRAN) injection 4 mg (4 mg Intravenous Given 10/20/15 1711)  oxyCODONE-acetaminophen (PERCOCET/ROXICET) 5-325 MG per tablet 2 tablet (2 tablets Oral Given 10/20/15 1709)     NEW OUTPATIENT MEDICATIONS STARTED DURING THIS VISIT:  New Prescriptions   ONDANSETRON (ZOFRAN ODT) 4 MG DISINTEGRATING TABLET    Allow 1-2 tablets to dissolve in your mouth every 8 hours as needed for nausea/vomiting      Note:  This document was prepared using Dragon voice recognition software and may include unintentional dictation errors.    Hinda Kehr, MD 10/20/15 [phone]

## 2015-10-20 NOTE — ED Triage Notes (Signed)
Pt with mid sternal chest pain started this am while he was watching tv, worse when he takes a deep breath and states he vomited x1.

## 2015-10-20 NOTE — Discharge Instructions (Signed)

## 2015-11-06 DIAGNOSIS — G4733 Obstructive sleep apnea (adult) (pediatric): Secondary | ICD-10-CM | POA: Diagnosis not present

## 2015-11-06 DIAGNOSIS — G7111 Myotonic muscular dystrophy: Secondary | ICD-10-CM | POA: Diagnosis not present

## 2015-12-01 ENCOUNTER — Encounter: Payer: Self-pay | Admitting: Family Medicine

## 2015-12-01 ENCOUNTER — Telehealth: Payer: Self-pay | Admitting: *Deleted

## 2015-12-01 ENCOUNTER — Ambulatory Visit (INDEPENDENT_AMBULATORY_CARE_PROVIDER_SITE_OTHER): Payer: Commercial Managed Care - HMO | Admitting: Family Medicine

## 2015-12-01 VITALS — BP 114/68 | HR 65 | Temp 98.1°F | Wt 208.0 lb

## 2015-12-01 DIAGNOSIS — K219 Gastro-esophageal reflux disease without esophagitis: Secondary | ICD-10-CM

## 2015-12-01 DIAGNOSIS — G4733 Obstructive sleep apnea (adult) (pediatric): Secondary | ICD-10-CM | POA: Diagnosis not present

## 2015-12-01 DIAGNOSIS — R7309 Other abnormal glucose: Secondary | ICD-10-CM

## 2015-12-01 DIAGNOSIS — R079 Chest pain, unspecified: Secondary | ICD-10-CM

## 2015-12-01 DIAGNOSIS — R7303 Prediabetes: Secondary | ICD-10-CM | POA: Insufficient documentation

## 2015-12-01 LAB — HEMOGLOBIN A1C: Hgb A1c MFr Bld: 5.6 % (ref 4.6–6.5)

## 2015-12-01 NOTE — Progress Notes (Signed)
  [name], MD Phone: [phone]  James [name] is a 50 y.o. male who presents today for f/u.  OSA: using nightly. Sleeping well. Using 7 hours a night. No snoring. Has helped with some day time sleepiness.  GERD: no symptoms now. No reflux, turning, sour taste, blood in stool, or abdominal pain. Not taking any medicines at this time.  Since our last visit patient had an ED visit for chest pain. Notes he felt sick and vomited and then fell into the toilet. Had some sweating with this. Was evaluated in the emergency room with negative troponins. EKG with no ischemic changes. Has not had any recurrence of the symptoms. Has a follow-up with his cardiologist in the next several weeks. No issues with his breathing.  Patient notes 3-4 days ago he felt a rattle in his chest. Occurred after eating. Has not had any cough, congestion, fevers, or shortness of breath. The rattle has not recurred. Feels well at this time.  PMH: nonsmoker.   ROS see history of present illness  Objective  Physical Exam Vitals:   12/01/15 0916  BP: 114/68  Pulse: 65  Temp: 98.1 F (36.7 C)    BP Readings from Last 3 Encounters:  12/01/15 114/68  10/20/15 120/79  09/25/15 112/70   Wt Readings from Last 3 Encounters:  12/01/15 208 lb (94.3 kg)  10/20/15 200 lb (90.7 kg)  09/25/15 204 lb (92.5 kg)    Physical Exam  Constitutional: No distress.  HENT:  Head: Normocephalic and atraumatic.  Cardiovascular: Normal rate, regular rhythm and intact distal pulses.   Pulmonary/Chest: Effort normal and breath sounds normal.  Abdominal: Soft. Bowel sounds are normal. He exhibits no distension. There is no tenderness.  Neurological: He is alert. Gait normal.  Skin: Skin is warm and dry. He is not diaphoretic.     Assessment/Plan: Please see individual problem list.  Obstructive sleep apnea Doing well on BiPAP. Encouraged continue nightly use.  GERD (gastroesophageal reflux disease) Asymptomatic.  No longer on medication. Continue to monitor.  Chest pain Patient with a ED visit for chest pain is felt to be musculoskeletal. Had negative workup for cardiac cause in the ED. No recurrence. Encouraged him to follow-up with his cardiologist as scheduled. If recurs he'll seek medical attention.  Elevated glucose Elevated on ED labs. We will check an A1c today.  Unsure of cause of rattle patient described from 3 days ago. Could've been mild aspiration given it occurred after he was eating. Could've been mild irritation from viral illness as well. He is asymptomatic at this time. Benign lung exam. Discussed continuing to monitor and if he develops any new symptoms he should let us know immediately.  Orders Placed This Encounter  Procedures  . HgB A1c    [name], MD [place]

## 2015-12-01 NOTE — Patient Instructions (Signed)
Nice to see you. Please keep your follow-up appointment with your cardiologist. Please continue to use your BiPAP nightly. Please monitor for return of the rattling in your chest. If you develop chest pain, shortness of breath, cough, productive cough, shortness of breath, fevers, or any new or changing symptoms please seek medical attention.

## 2015-12-01 NOTE — Assessment & Plan Note (Signed)
Patient with a ED visit for chest pain is felt to be musculoskeletal. Had negative workup for cardiac cause in the ED. No recurrence. Encouraged him to follow-up with his cardiologist as scheduled. If recurs he'll seek medical attention.

## 2015-12-01 NOTE — Assessment & Plan Note (Signed)
Doing well on BiPAP. Encouraged continue nightly use.

## 2015-12-01 NOTE — Assessment & Plan Note (Signed)
Asymptomatic. No longer on medication. Continue to monitor.

## 2015-12-01 NOTE — Assessment & Plan Note (Signed)
Elevated on ED labs. We will check an A1c today.

## 2015-12-01 NOTE — Progress Notes (Signed)
Pre visit review using our clinic review tool, if applicable. No additional management support is needed unless otherwise documented below in the visit note. 

## 2015-12-01 NOTE — Telephone Encounter (Signed)
SPoke with patient, reviewed labs. See result note.

## 2015-12-01 NOTE — Telephone Encounter (Signed)
Please return pt call [phone]  Possibly in reference to his lab

## 2015-12-07 DIAGNOSIS — G7111 Myotonic muscular dystrophy: Secondary | ICD-10-CM | POA: Diagnosis not present

## 2015-12-07 DIAGNOSIS — G4733 Obstructive sleep apnea (adult) (pediatric): Secondary | ICD-10-CM | POA: Diagnosis not present

## 2015-12-22 DIAGNOSIS — I428 Other cardiomyopathies: Secondary | ICD-10-CM | POA: Diagnosis not present

## 2015-12-22 DIAGNOSIS — I447 Left bundle-branch block, unspecified: Secondary | ICD-10-CM | POA: Diagnosis not present

## 2015-12-22 DIAGNOSIS — R002 Palpitations: Secondary | ICD-10-CM | POA: Diagnosis not present

## 2015-12-22 DIAGNOSIS — G7111 Myotonic muscular dystrophy: Secondary | ICD-10-CM | POA: Diagnosis not present

## 2016-01-01 DIAGNOSIS — I5189 Other ill-defined heart diseases: Secondary | ICD-10-CM | POA: Diagnosis not present

## 2016-01-01 DIAGNOSIS — I447 Left bundle-branch block, unspecified: Secondary | ICD-10-CM | POA: Diagnosis not present

## 2016-01-01 DIAGNOSIS — G7111 Myotonic muscular dystrophy: Secondary | ICD-10-CM | POA: Diagnosis not present

## 2016-01-06 DIAGNOSIS — G7111 Myotonic muscular dystrophy: Secondary | ICD-10-CM | POA: Diagnosis not present

## 2016-01-06 DIAGNOSIS — G4733 Obstructive sleep apnea (adult) (pediatric): Secondary | ICD-10-CM | POA: Diagnosis not present

## 2016-01-13 DIAGNOSIS — R0602 Shortness of breath: Secondary | ICD-10-CM | POA: Diagnosis not present

## 2016-01-13 DIAGNOSIS — J9811 Atelectasis: Secondary | ICD-10-CM | POA: Diagnosis not present

## 2016-01-17 IMAGING — CR DG CHEST 2V
1 series · 2 of 2 positions shown · non-contrast
Comparison: Two days ago

CLINICAL DATA: Pneumonia

EXAM:
CHEST  2 VIEW

[Series 1: dg chest 2 view · 0.14mm/px · 2 of 2 slices shown]
[im 1/2]
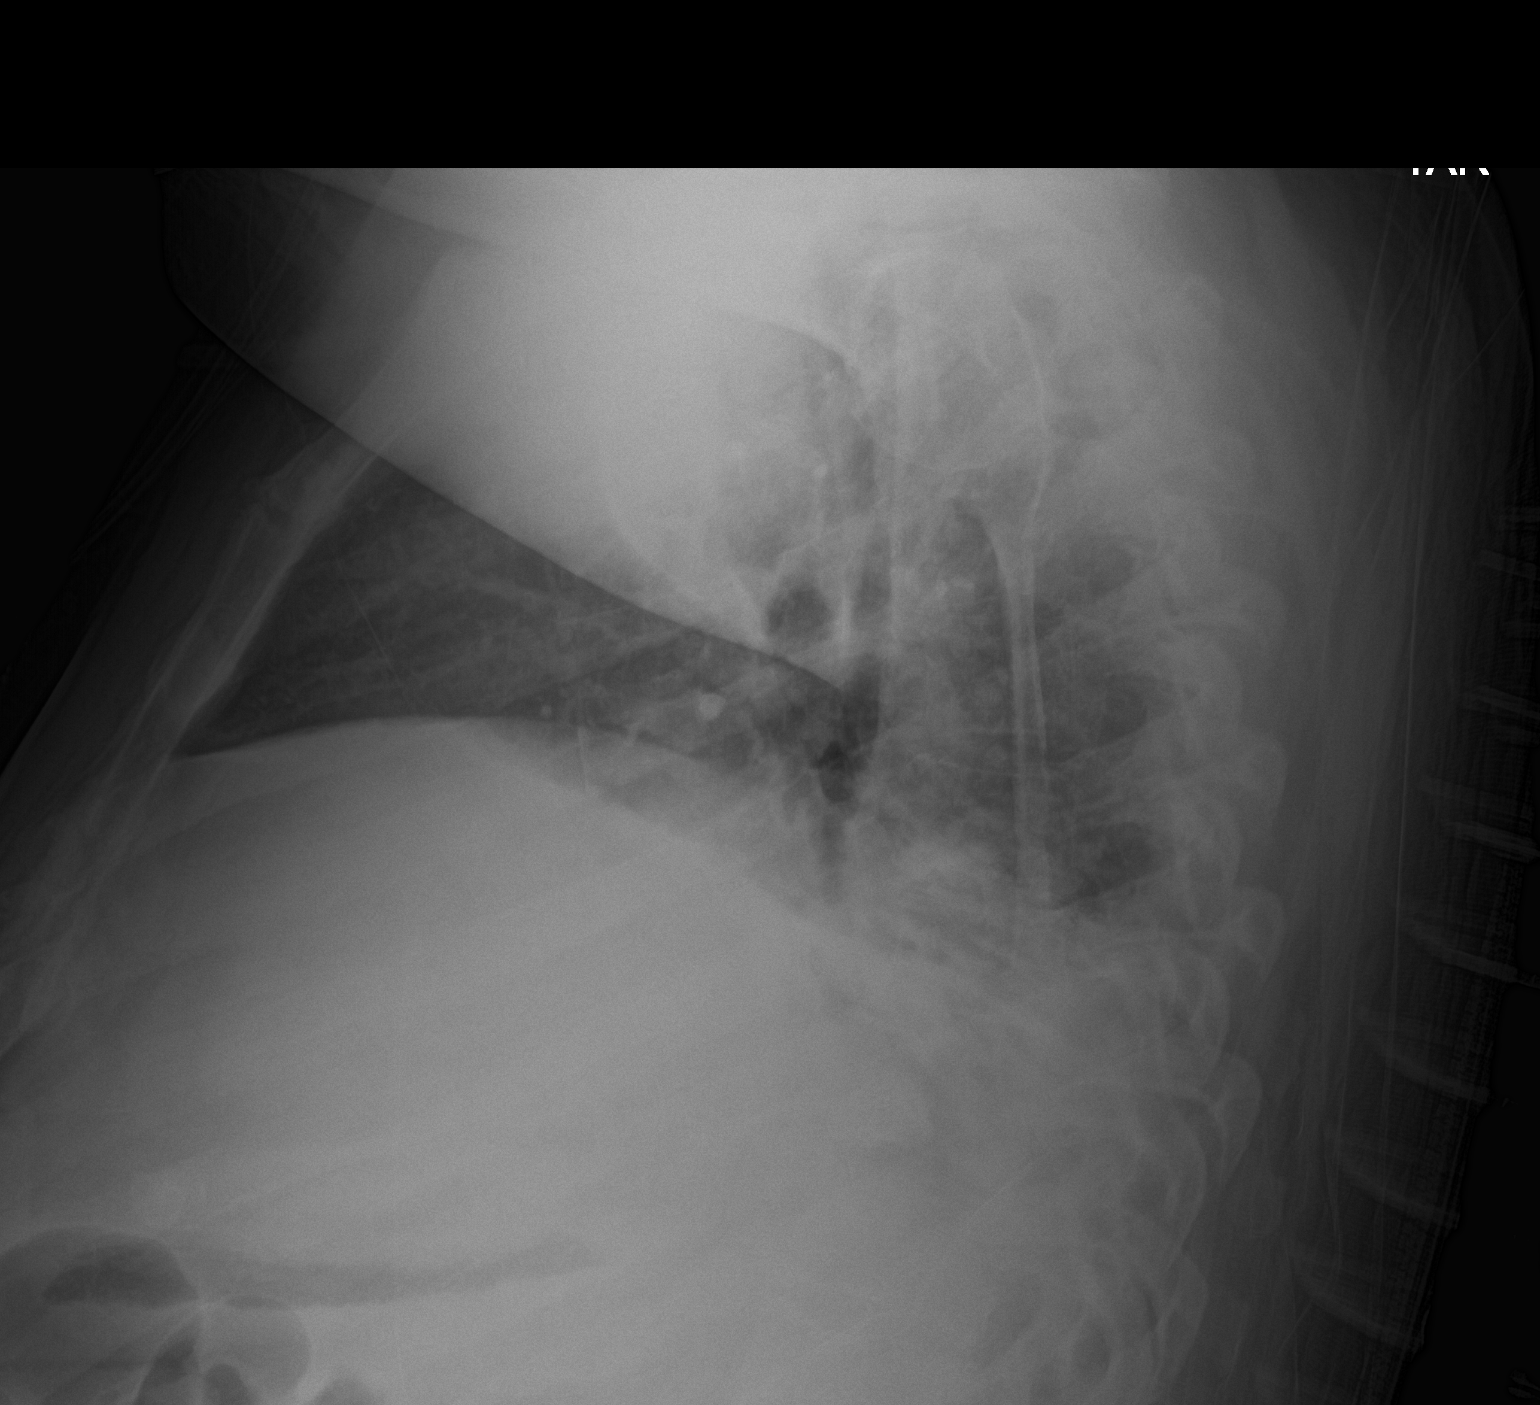
[im 2/2]
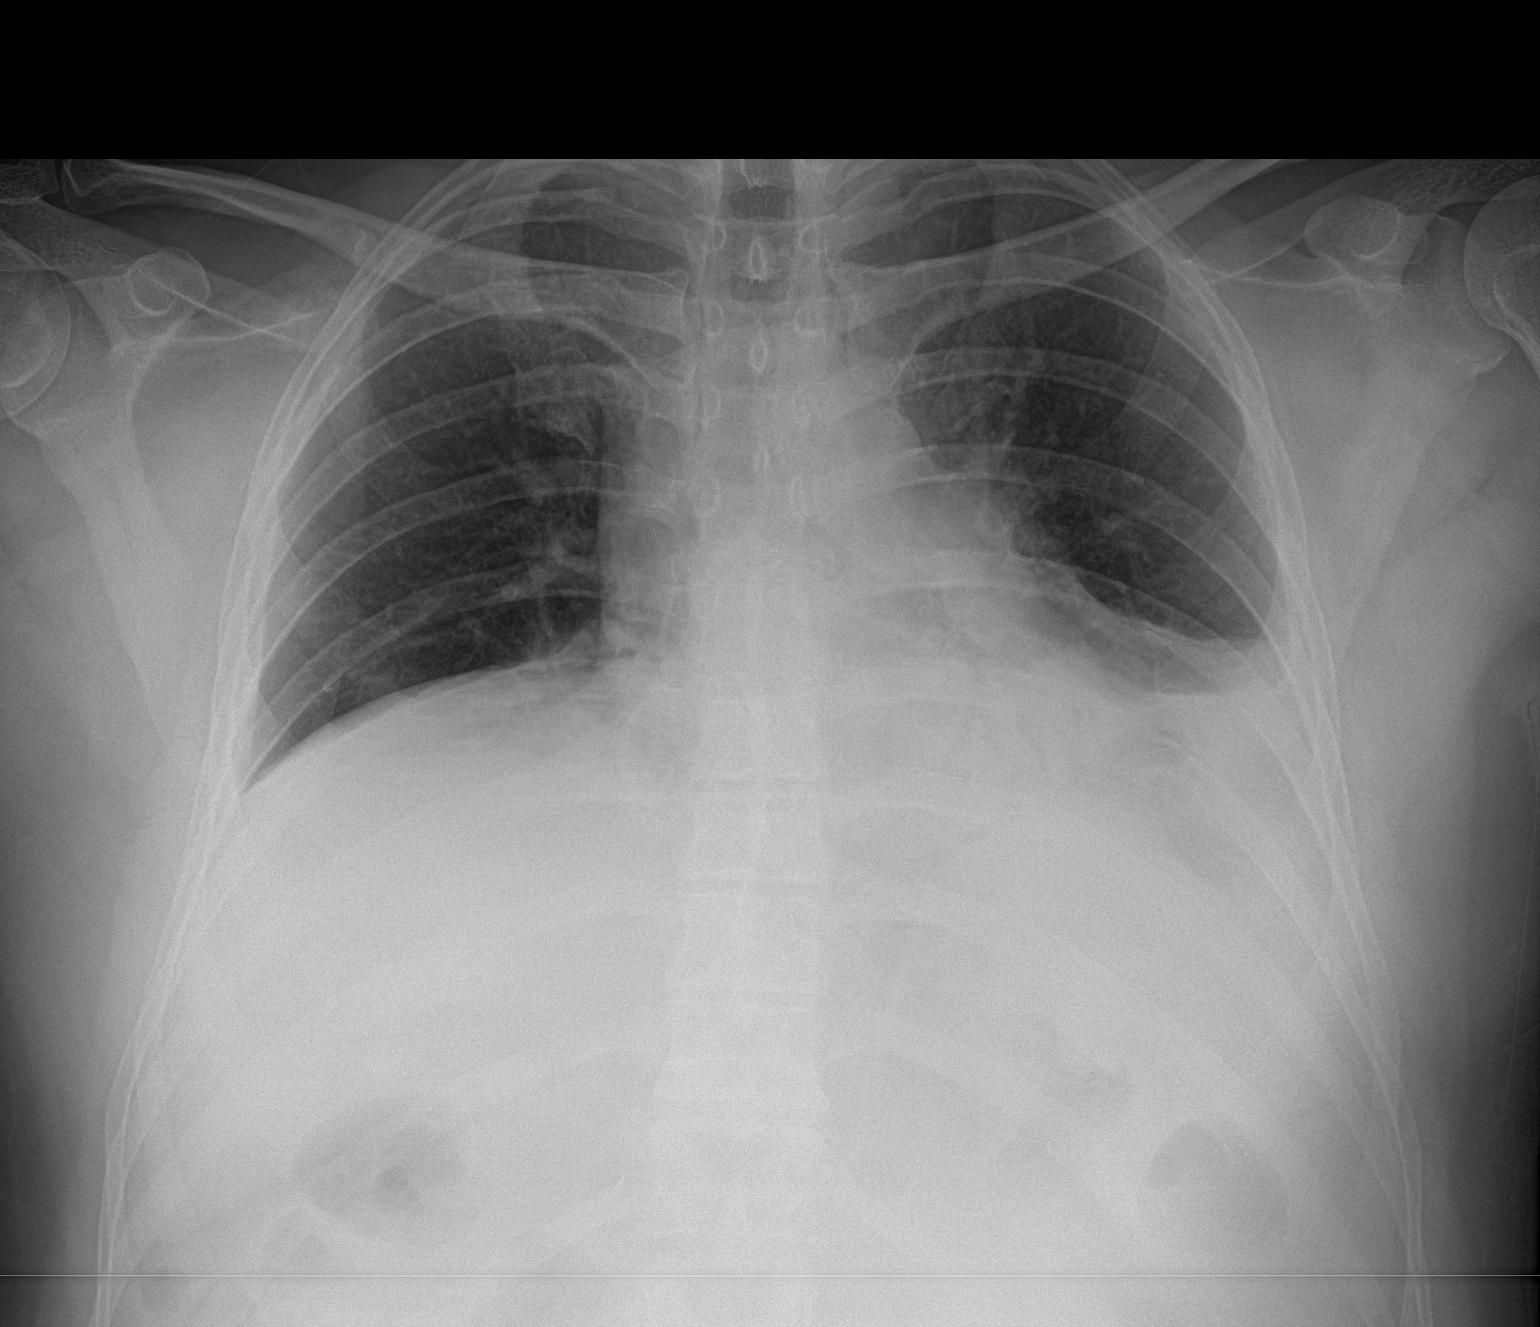

[2 of 2 positions shown; findings below may reference images not displayed]

FINDINGS: Unchanged lung volumes with left basilar opacity including air
bronchograms. Stable small left pleural effusion. Improving right
basilar aeration with better defined diaphragm. Normal heart size
and stable mediastinal contours.
IMPRESSION: Unchanged retrocardiac atelectasis or pneumonia with small pleural
effusion.

## 2016-02-06 DIAGNOSIS — G4733 Obstructive sleep apnea (adult) (pediatric): Secondary | ICD-10-CM | POA: Diagnosis not present

## 2016-02-06 DIAGNOSIS — G7111 Myotonic muscular dystrophy: Secondary | ICD-10-CM | POA: Diagnosis not present

## 2016-03-01 ENCOUNTER — Encounter: Payer: Self-pay | Admitting: Family Medicine

## 2016-03-01 ENCOUNTER — Ambulatory Visit (INDEPENDENT_AMBULATORY_CARE_PROVIDER_SITE_OTHER): Payer: Commercial Managed Care - HMO | Admitting: Family Medicine

## 2016-03-01 DIAGNOSIS — Z23 Encounter for immunization: Secondary | ICD-10-CM | POA: Diagnosis not present

## 2016-03-01 NOTE — Assessment & Plan Note (Signed)
Well-controlled currently. Tolerating BiPAP. He will continue to use this nightly. He'll continue to periodically follow up with pulmonology as well.

## 2016-03-01 NOTE — Assessment & Plan Note (Signed)
Asymptomatic. Weight is relatively stable. Discussed continuing to monitor. Continue to follow with cardiology.

## 2016-03-01 NOTE — Patient Instructions (Signed)
Nice to see you. Please continue to use her BiPAP. Please monitor for trouble breathing and chest pain and if these occur please seek medical attention.

## 2016-03-01 NOTE — Progress Notes (Signed)
Pre visit review using our clinic review tool, if applicable. No additional management support is needed unless otherwise documented below in the visit note. 

## 2016-03-01 NOTE — Assessment & Plan Note (Signed)
Asymptomatic currently. Continue to monitor for recurrence of symptoms.

## 2016-03-01 NOTE — Progress Notes (Signed)
  [name], MD Phone: [phone]  James [name] is a 50 y.o. male who presents today for follow-up.  Sleep apnea: Using BiPAP nightly. Uses for 7 or 9 hours. Sleeps well with this. No daytime sleepiness. No snoring. Followed by pulmonology for this. He has not received his flu shot this year. No prior adverse reactions with the flu shot.  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 Followed by cardiology at [facility] for this. Recently had an echo that revealed an EF of 45%. No orthopnea, PND, shortness breath, or chest pain.  GERD: No symptoms recently. No burning or sour taste. No abdominal pain. No blood in the stool.  PMH: nonsmoker   ROS see history of present illness  Objective  Physical Exam Vitals:   03/01/16 1002  BP: 110/62  Pulse: 81  Temp: 98.2 F (36.8 C)    BP Readings from Last 3 Encounters:  03/01/16 110/62  12/01/15 114/68  10/20/15 120/79   Wt Readings from Last 3 Encounters:  03/01/16 203 lb 9.6 oz (92.4 kg)  12/01/15 208 lb (94.3 kg)  10/20/15 200 lb (90.7 kg)    Physical Exam  Constitutional: No distress.  Cardiovascular: Normal rate, regular rhythm and normal heart sounds.   Pulmonary/Chest: Effort normal and breath sounds normal.  Abdominal: Soft. Bowel sounds are normal. He exhibits no distension. There is no tenderness.  Neurological: He is alert.  Skin: Skin is warm and dry. He is not diaphoretic.     Assessment/Plan: Please see individual problem list.  Cardiomyopathy Asymptomatic. Weight is relatively stable. Discussed continuing to monitor. Continue to follow with cardiology.  Obstructive sleep apnea Well-controlled currently. Tolerating BiPAP. He will continue to use this nightly. He'll continue to periodically follow up with pulmonology as well.  GERD (gastroesophageal reflux disease) Asymptomatic currently. Continue to monitor for recurrence of symptoms.   Orders Placed This Encounter  Procedures  . Flu Vaccine QUAD  36+ mos IM    [name], MD [place]

## 2016-03-07 DIAGNOSIS — G7111 Myotonic muscular dystrophy: Secondary | ICD-10-CM | POA: Diagnosis not present

## 2016-03-07 DIAGNOSIS — G4733 Obstructive sleep apnea (adult) (pediatric): Secondary | ICD-10-CM | POA: Diagnosis not present

## 2016-03-29 ENCOUNTER — Encounter: Payer: Self-pay | Admitting: Emergency Medicine

## 2016-03-29 ENCOUNTER — Emergency Department
Admission: EM | Admit: 2016-03-29 | Discharge: 2016-03-29 | Disposition: A | Discharge status: Home / Self Care | Payer: Medicare HMO | Attending: Emergency Medicine | Admitting: Emergency Medicine

## 2016-03-29 ENCOUNTER — Emergency Department: Payer: Medicare HMO

## 2016-03-29 DIAGNOSIS — J40 Bronchitis, not specified as acute or chronic: Secondary | ICD-10-CM | POA: Diagnosis not present

## 2016-03-29 DIAGNOSIS — R05 Cough: Secondary | ICD-10-CM | POA: Diagnosis not present

## 2016-03-29 DIAGNOSIS — R509 Fever, unspecified: Secondary | ICD-10-CM | POA: Diagnosis present

## 2016-03-29 LAB — RAPID INFLUENZA A&B ANTIGENS
Influenza A (ARMC): NEGATIVE
Influenza B (ARMC): NEGATIVE

## 2016-03-29 MED ORDER — AZITHROMYCIN 250 MG PO TABS: ORAL_TABLET | ORAL | 0 refills | Status: DC

## 2016-03-29 MED ORDER — ALBUTEROL SULFATE HFA 108 (90 BASE) MCG/ACT IN AERS: 2.0000 | INHALATION_SPRAY | RESPIRATORY_TRACT | 0 refills | Status: DC | PRN

## 2016-03-29 MED ORDER — PREDNISONE 50 MG PO TABS: 50.0000 mg | ORAL_TABLET | Freq: Every day | ORAL | 0 refills | Status: DC

## 2016-03-29 NOTE — ED Provider Notes (Signed)
[facility] Emergency Department Provider Note  ____________________________________________  Time seen: Approximately 8:55 PM  I have reviewed the triage vital signs and the nursing notes.   HISTORY  Chief Complaint Cough    HPI James [name] is a 51 y.o. male who presents emergency department for complaint of subjective, tactile fevers as well as coughing. Patient states that he has a history of muscular dystrophy with recurring pneumonia and was concerned that he hasn't same returning. The patient denies any nasal congestion or sore throat, abdominal pain, nausea or vomiting, diarrhea or constipation. Patient denies any chest pain or shortness of breath. Patient reports that the cough is nonproductive at this time.  Patient has had multiple community acquired, healthcare acquired, aspiration pneumonia. Patient reports that approximately a year ago to date he was hospitalized in ICU for severe, bilateral pneumonia. Patient statestypically starts with similar symptoms as he is currently experiencing is quickly progresses into pneumonia.   Past Medical History:  Diagnosis Date  . Cardiomyopathy ([place])   . GERD (gastroesophageal reflux disease)   . History of chicken pox   . History of pneumonia 2013   double PNA at [facility]  . LBBB (left bundle branch block)   . Myotonic dystrophy, type 1 ([place])    followed by [facility]    Patient Active Problem List   Diagnosis Date Noted  . Chest pain 12/01/2015  . Elevated glucose 12/01/2015  . Palpitations 09/25/2015  . Obstructive sleep apnea 06/26/2015  . Rotator cuff tendinitis 05/22/2015  . Acute upper respiratory infection 05/22/2015  . Dysphagia   . Acute respiratory insufficiency 03/30/2015  . Erectile dysfunction 06/01/2013  . Hypogonadism male 06/01/2013  . GERD (gastroesophageal reflux disease)   . Cardiomyopathy ([place])   . LBBB (left bundle branch block)   . Myotonic dystrophy, type 1 ([place])     Past  Surgical History:  Procedure Laterality Date  . US ECHOCARDIOGRAPHY  09/2010   EF 50%, mld LVH and dysfunction, mild RV dysfunction and enlarged    Prior to Admission medications   Medication Sig Start Date End Date Taking? Authorizing Provider  acetaminophen (TYLENOL) 325 MG tablet Take 650 mg by mouth every 6 (six) hours as needed.    Historical Provider, MD  albuterol (PROVENTIL HFA;VENTOLIN HFA) 108 (90 Base) MCG/ACT inhaler Inhale 2 puffs into the lungs every 4 (four) hours as needed for wheezing or shortness of breath. 03/29/16   Charline Bills Cuthriell, PA-C  albuterol (PROVENTIL) (2.5 MG/3ML) 0.083% nebulizer solution Take 3 mLs (2.5 mg total) by nebulization every 6 (six) hours as needed for wheezing or shortness of breath. 04/07/15   Vaughan Basta, MD  azithromycin (ZITHROMAX Z-PAK) 250 MG tablet Take 2 tablets (500 mg) on  Day 1,  followed by 1 tablet (250 mg) once daily on Days 2 through 5. 03/29/16   Roderic Palau D Cuthriell, PA-C  docusate sodium (COLACE) 50 MG capsule Take 50 mg by mouth as needed.     Historical Provider, MD  guaiFENesin (MUCINEX) 600 MG 12 hr tablet Take 600 mg by mouth 2 (two) times daily.    Historical Provider, MD  loratadine (CLARITIN) 10 MG tablet Take 1 tablet (10 mg total) by mouth daily. 04/13/15   Leone Haven, MD  Multiple Vitamin (MULTIVITAMIN) tablet Take 1 tablet by mouth daily.    Historical Provider, MD  ondansetron (ZOFRAN ODT) 4 MG disintegrating tablet Allow 1-2 tablets to dissolve in your mouth every 8 hours as needed for nausea/vomiting Patient  not taking: Reported on 03/01/2016 10/20/15   Hinda Kehr, MD  predniSONE (DELTASONE) 50 MG tablet Take 1 tablet (50 mg total) by mouth daily with breakfast. 03/29/16   Charline Bills Cuthriell, PA-C    Allergies Codeine  Family History  Problem Relation Age of Onset  . Hypertension Father   . Cancer Paternal Grandfather     lung, smoker  . Cancer Paternal Uncle     bone  . Diabetes Mother   . CAD  Mother 26    MI  . Stroke Neg Hx     Social History Social History  Substance Use Topics  . Smoking status: Never Smoker  . Smokeless tobacco: Never Used  . Alcohol use No     Review of Systems  Constitutional: Subjective fever/chills Eyes: No visual changes. No discharge ENT: No upper respiratory complaints. Cardiovascular: no chest pain. Respiratory: Positive cough. No SOB. Gastrointestinal: No abdominal pain.  No nausea, no vomiting.  No diarrhea.  No constipation. Musculoskeletal: Negative for musculoskeletal pain. Skin: Negative for rash, abrasions, lacerations, ecchymosis. Neurological: Negative for headaches, focal weakness or numbness. 10-point ROS otherwise negative.  ____________________________________________   PHYSICAL EXAM:  VITAL SIGNS: ED Triage Vitals  Enc Vitals Group     BP 03/29/16 1843 (!) 142/81     Pulse Rate 03/29/16 1843 94     Resp --      Temp 03/29/16 1843 98.2 F (36.8 C)     Temp Source 03/29/16 1843 Oral     SpO2 03/29/16 1843 95 %     Weight 03/29/16 1843 200 lb (90.7 kg)     Height 03/29/16 1843 5\' 11"  (1.803 m)     Head Circumference --      Peak Flow --      Pain Score 03/29/16 1919 0     Pain Loc --      Pain Edu? --      Excl. in [place]? --      Constitutional: Alert and oriented. Well appearing and in no acute distress. Eyes: Conjunctivae are normal. PERRL. EOMI. Head: Atraumatic. ENT:      Ears: EACs and TMs are unremarkable bilaterally.      Nose: No congestion/rhinnorhea.      Mouth/Throat: Mucous membranes are moist.  Neck: No stridor. Neck is supple full range of motion Hematological/Lymphatic/Immunilogical: No cervical lymphadenopathy. Cardiovascular: Normal rate, regular rhythm. Normal S1 and S2.  Good peripheral circulation. Respiratory: Normal respiratory effort without tachypnea or retractions. Lungs with a few, scattered, coarse breath sounds with occasional wheeze. No rales or rhonchi. Good air entry to the  bases with no decreased or absent breath sounds. Musculoskeletal: Full range of motion to all extremities. No gross deformities appreciated. Neurologic:  Normal speech and language. No gross focal neurologic deficits are appreciated.  Skin:  Skin is warm, dry and intact. No rash noted. Psychiatric: Mood and affect are normal. Speech and behavior are normal. Patient exhibits appropriate insight and judgement.   ____________________________________________   LABS (all labs ordered are listed, but only abnormal results are displayed)  Labs Reviewed  RAPID INFLUENZA A&B ANTIGENS ([place])   ____________________________________________  EKG   ____________________________________________  RADIOLOGY Diamantina [place] Cuthriell, personally viewed and evaluated these images (plain radiographs) as part of my medical decision making, as well as reviewing the written report by the radiologist.  Dg Chest 2 View  Result Date: 03/29/2016 CLINICAL DATA:  Cough for 3 days.  History of pneumonia, GERD. EXAM: CHEST  2  VIEW COMPARISON:  Chest x-rays dated 10/20/2015 and 05/22/2015. FINDINGS: Heart size and mediastinal contours are stable. Chronic streaky opacities are seen at each lung base, presumably chronic scarring or atelectasis. No new lung findings. No evidence of pneumonia. No pleural effusion or pneumothorax seen. Osseous structures about the chest are unremarkable. IMPRESSION: No active cardiopulmonary disease. No evidence of pneumonia or pulmonary edema. Electronically Signed   By: Franki Cabot M.D.   On: 03/29/2016 20:49    ____________________________________________    PROCEDURES  Procedure(s) performed:    Procedures    Medications - No data to display   ____________________________________________   INITIAL IMPRESSION / ASSESSMENT AND PLAN / ED COURSE  Pertinent labs & imaging results that were available during my care of the patient were reviewed by me and considered in  my medical decision making (see chart for details).  Review of the [place] CSRS was performed in accordance of the [place] prior to dispensing any controlled drugs.  Clinical Course     Patient's diagnosis is consistent with Bronchitis. Flu swab is negative. Chest x-ray reveals no acute cardiopulmonary abnormality. Patient symptoms and exam are consistent with bronchitis. While this is most likely viral in nature, due to patient's significant history of recurring pneumonia, patient will be treated with antibiotics prophylactically. He'll also be given prednisone and albuterol inhaler. Patient will follow-up with primary care as needed. Patient is given ED precautions to return to the ED for any worsening or new symptoms.     ____________________________________________  FINAL CLINICAL IMPRESSION(S) / ED DIAGNOSES  Final diagnoses:  Bronchitis      NEW MEDICATIONS STARTED DURING THIS VISIT:  New Prescriptions   ALBUTEROL (PROVENTIL HFA;VENTOLIN HFA) 108 (90 BASE) MCG/ACT INHALER    Inhale 2 puffs into the lungs every 4 (four) hours as needed for wheezing or shortness of breath.   AZITHROMYCIN (ZITHROMAX Z-PAK) 250 MG TABLET    Take 2 tablets (500 mg) on  Day 1,  followed by 1 tablet (250 mg) once daily on Days 2 through 5.   PREDNISONE (DELTASONE) 50 MG TABLET    Take 1 tablet (50 mg total) by mouth daily with breakfast.        This chart was dictated using voice recognition software/Dragon. Despite best efforts to proofread, errors can occur which can change the meaning. Any change was purely unintentional.    Darletta Moll, PA-C 03/29/16 2112    Nena Polio, MD 03/29/16 [phone]

## 2016-03-29 NOTE — ED Notes (Signed)
Pt reports that he has had a cough for 2-3 days (productive with clear phlegm)  - he started with fever that has since resolved - vomited 2 times yesterday

## 2016-03-29 NOTE — ED Triage Notes (Signed)
Patient presents to ED via POV with c/o cough x 3 days. Patient has tried cough medication at home with little relief.

## 2016-04-07 ENCOUNTER — Ambulatory Visit (INDEPENDENT_AMBULATORY_CARE_PROVIDER_SITE_OTHER): Payer: Commercial Managed Care - HMO | Admitting: Family Medicine

## 2016-04-07 ENCOUNTER — Encounter: Payer: Self-pay | Admitting: Family Medicine

## 2016-04-07 DIAGNOSIS — G4733 Obstructive sleep apnea (adult) (pediatric): Secondary | ICD-10-CM | POA: Diagnosis not present

## 2016-04-07 DIAGNOSIS — J40 Bronchitis, not specified as acute or chronic: Secondary | ICD-10-CM | POA: Insufficient documentation

## 2016-04-07 DIAGNOSIS — J209 Acute bronchitis, unspecified: Secondary | ICD-10-CM | POA: Diagnosis not present

## 2016-04-07 DIAGNOSIS — G7111 Myotonic muscular dystrophy: Secondary | ICD-10-CM | POA: Diagnosis not present

## 2016-04-07 MED ORDER — BENZONATATE 200 MG PO CAPS: 200.0000 mg | ORAL_CAPSULE | Freq: Two times a day (BID) | ORAL | 0 refills | Status: DC | PRN

## 2016-04-07 NOTE — Patient Instructions (Signed)
Nice to see you. You appear to be doing better than you were in the emergency room. Please continue Mucinex. You can continue Delsym for cough. Please do not drink the Delsym straight out of the bottle. You can take the Tessalon for cough as well. If you develop fevers, shortness of breath, chest pain, palpitations, cough productive blood, or any new or change in symptoms please seek medical attention immediately.

## 2016-04-07 NOTE — Progress Notes (Signed)
  [name], MD Phone: [phone]  James [name] is a 51 y.o. male who presents today for same-day visit.  Patient was treated for bronchitis in the emergency room. Had negative chest x-ray and negative rapid flu. Notes they treated him with a Z-Pak and prednisone. Advised him it was bronchitis. He notes overall his symptoms have improved. His cough is still present though is nonproductive at this time. Was previously productive. He notes no fevers, shortness breath, chest pain, or palpitations. His congestion is quite a bit improved. He notes he had some mild frontal headache yesterday though took Tylenol and this went away. No numbness, weakness, or vision changes. Has been taking Delsym with some benefit for his cough. His father is with him and notes he looks a lot better than he did previously. He does still feel somewhat tired.  ROS see history of present illness  Objective  Physical Exam Vitals:   04/07/16 0828  BP: 118/80  Pulse: 99  Temp: 98.6 F (37 C)    BP Readings from Last 3 Encounters:  04/07/16 118/80  03/29/16 (!) 142/81  03/01/16 110/62   Wt Readings from Last 3 Encounters:  04/07/16 199 lb (90.3 kg)  03/29/16 200 lb (90.7 kg)  03/01/16 203 lb 9.6 oz (92.4 kg)    Physical Exam  Constitutional: No distress.  HENT:  Head: Normocephalic and atraumatic.  Mouth/Throat: Oropharynx is clear and moist. No oropharyngeal exudate.  Eyes: Conjunctivae are normal. Pupils are equal, round, and reactive to light.  Cardiovascular: Normal rate, regular rhythm and normal heart sounds.   Pulmonary/Chest: Effort normal and breath sounds normal.  Musculoskeletal: He exhibits no edema.  Neurological: He is alert.  Skin: He is not diaphoretic.     Assessment/Plan: Please see individual problem list.  Acute bronchitis Symptoms seem most consistent with bronchitis. He has improved with prednisone and a Z-Pak. Discussed that the cough could persist for several weeks  after bronchitis. Vital signs stable today. Neurologically intact. Discussed that there is no need for an antibiotic at this time. Expect him to continue to improve. Tessalon for cough. Given return precautions.   No orders of the defined types were placed in this encounter.   Meds ordered this encounter  Medications  . benzonatate (TESSALON) 200 MG capsule    Sig: Take 1 capsule (200 mg total) by mouth 2 (two) times daily as needed for cough.    Dispense:  20 capsule    Refill:  0    [name], MD [place]

## 2016-04-07 NOTE — Progress Notes (Signed)
Pre visit review using our clinic review tool, if applicable. No additional management support is needed unless otherwise documented below in the visit note. 

## 2016-04-07 NOTE — Assessment & Plan Note (Signed)
Symptoms seem most consistent with bronchitis. He has improved with prednisone and a Z-Pak. Discussed that the cough could persist for several weeks after bronchitis. Vital signs stable today. Neurologically intact. Discussed that there is no need for an antibiotic at this time. Expect him to continue to improve. Tessalon for cough. Given return precautions.

## 2016-05-08 DIAGNOSIS — G4733 Obstructive sleep apnea (adult) (pediatric): Secondary | ICD-10-CM | POA: Diagnosis not present

## 2016-05-08 DIAGNOSIS — G7111 Myotonic muscular dystrophy: Secondary | ICD-10-CM | POA: Diagnosis not present

## 2016-05-19 ENCOUNTER — Ambulatory Visit (INDEPENDENT_AMBULATORY_CARE_PROVIDER_SITE_OTHER): Payer: Medicare HMO

## 2016-05-19 VITALS — BP 126/82 | HR 78 | Resp 12 | Ht 71.0 in | Wt 196.0 lb

## 2016-05-19 DIAGNOSIS — Z0000 Encounter for general adult medical examination without abnormal findings: Secondary | ICD-10-CM | POA: Diagnosis not present

## 2016-05-19 NOTE — Progress Notes (Signed)
Subjective:   James [name] is a 51 y.o. male who presents for an Initial Medicare Annual Wellness Visit.  Review of Systems  No ROS.  Medicare Wellness Visit.  Sleep patterns: Sleeps 7 hours a night, naps during day. Sometimes feels rested.  Home Safety/Smoke Alarms:  Fire alarms in home. Feels safe in home.  Living environment; residence and Firearm Safety: Has shower rails to hold onto when showering. No stairs in house. One story home. Firearms in safe.  Seat Belt Safety/Bike Helmet: Wears seat belt.   Counseling:   Eye Exam- Yearly exams.  Dental- Cleanings every 6 months.   Cologuard order: Discussed. Follow as directed. Educational material provided.  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   End of life planning: Advance aging; Advanced directives discussed. No HCPOA/Living will. Additional information declined at this time.       Objective:    Today's Vitals   05/19/16 1410  BP: 126/82  Pulse: 78  Resp: 12  SpO2: 98%  Weight: 196 lb (88.9 kg)  Height: 5\' 11"  (1.803 m)   Body mass index is 27.34 kg/m.  Current Medications (verified) Outpatient Encounter Prescriptions as of 05/19/2016  Medication Sig  . acetaminophen (TYLENOL) 325 MG tablet Take 650 mg by mouth every 6 (six) hours as needed.  Marland Kitchen albuterol (PROVENTIL) (2.5 MG/3ML) 0.083% nebulizer solution Take 3 mLs (2.5 mg total) by nebulization every 6 (six) hours as needed for wheezing or shortness of breath.  . docusate sodium (COLACE) 50 MG capsule Take 50 mg by mouth as needed.   . loratadine (CLARITIN) 10 MG tablet Take 1 tablet (10 mg total) by mouth daily.  . Multiple Vitamin (MULTIVITAMIN) tablet Take 1 tablet by mouth daily.  Marland Kitchen albuterol (PROVENTIL HFA;VENTOLIN HFA) 108 (90 Base) MCG/ACT inhaler Inhale 2 puffs into the lungs every 4 (four) hours as needed for wheezing or shortness of breath.  .  benzonatate (TESSALON) 200 MG capsule Take 1 capsule (200 mg total) by mouth 2 (two) times daily as needed for cough. (Patient not taking: Reported on 05/19/2016)  . guaiFENesin (MUCINEX) 600 MG 12 hr tablet Take 600 mg by mouth 2 (two) times daily.  . ondansetron (ZOFRAN ODT) 4 MG disintegrating tablet Allow 1-2 tablets to dissolve in your mouth every 8 hours as needed for nausea/vomiting (Patient not taking: Reported on 05/19/2016)   No facility-administered encounter medications on file as of 05/19/2016.     Allergies (verified) Codeine   History: Past Medical History:  Diagnosis Date  . Bronchitis   . Cardiomyopathy (James [name])   . GERD (gastroesophageal reflux disease)   . History of chicken pox   . History of pneumonia 2013   double PNA at [facility] [name]  . LBBB (left bundle branch block)   . Myotonic dystrophy, type 1 (James [name])    followed by James [name]   Past Surgical History:  Procedure Laterality Date  . US ECHOCARDIOGRAPHY  09/2010   EF 50%, mld LVH and dysfunction, mild RV dysfunction and enlarged   Family History  Problem Relation Age of Onset  . Hypertension Father   . Cancer Paternal Grandfather     lung, smoker  . Cancer Paternal Uncle     bone  . Diabetes Mother   . CAD Mother 80    MI  . Stroke Neg Hx    Social History   Occupational History  . Not on  file.   Social History Main Topics  . Smoking status: Never Smoker  . Smokeless tobacco: Never Used  . Alcohol use No  . Drug use: No  . Sexual activity: Not on file   Tobacco Counseling Counseling given: Not Answered   Activities of Daily Living In your present state of health, do you have any difficulty performing the following activities: 05/19/2016  Hearing? N  Vision? N  Difficulty concentrating or making decisions? N  Walking or climbing stairs? Y  Dressing or bathing? N  Doing errands, shopping? N  Preparing Food and eating ? N  Using the Toilet? N  In the past six months, have you accidently leaked urine? N    Do you have problems with loss of bowel control? N  Managing your Medications? N  Managing your Finances? N  Housekeeping or managing your Housekeeping? N  Some recent data might be hidden    Immunizations and Health Maintenance Immunization History  Administered Date(s) Administered  . Influenza,inj,Quad PF,36+ Mos 03/01/2016  . Influenza-Unspecified 01/10/2014, 01/25/2015   Health Maintenance Due  Topic Date Due  . HIV Screening  01/18/1981  . TETANUS/TDAP  01/18/1985  . COLONOSCOPY  01/19/2016    Patient Care Team: James Haven, MD as PCP - General (Family Medicine)  Indicate any recent Medical Services you may have received from other than [facility] providers in the past year (date may be approximate).    Assessment:   This is a routine wellness examination for James [name].   Hearing/Vision screen Hearing Screening Comments: Able to hear conversational tones w/o difficulty. No issues reported.   Vision Screening Comments: Wears glasses. Last OV 2017. Visual acuity not assessed per pt preference since he has regular follow up with his ophthalmologist.   Dietary issues and exercise activities discussed: Current Exercise Habits: The patient does not participate in regular exercise at present, Exercise limited by: None identified   Diet (meal preparation, eat out, water intake, caffeinated beverages, dairy products, fruits and vegetables):  Breakfast: Egg sandwich Dinner: Hamburger Drinks 2 cans of soda/day. Does not drink water. Counseled pt regarding increasing water intake.   Goals    . Increase water intake,      Depression Screen PHQ 2/9 Scores 05/19/2016  PHQ - 2 Score 0    Fall Risk Fall Risk  05/19/2016  Falls in the past year? Yes  Number falls in past yr: 1  Injury with Fall? Yes  Risk for fall due to : History of fall(s);Impaired balance/gait  Follow up Falls prevention discussed    Cognitive Function: MMSE - Mini Mental State Exam 05/19/2016   Orientation to time 5  Orientation to Place 5  Registration 3  Attention/ Calculation 5  Recall 3  Language- name 2 objects 2  Language- repeat 1  Language- follow 3 step command 3  Language- read & follow direction 1  Write a sentence 1  Copy design 1  Total score 30        Screening Tests Health Maintenance  Topic Date Due  . HIV Screening  01/18/1981  . TETANUS/TDAP  01/18/1985  . COLONOSCOPY  01/19/2016  . INFLUENZA VACCINE  Addressed        Plan:  During the course of the visit F[place]c was educated and counseled about the following appropriate screening and preventive services:   Vaccines to include Pneumoccal, Influenza, Hepatitis B, Td, Zostavax, HCV  Colorectal cancer screening  Cardiovascular disease screening  Diabetes screening  Glaucoma screening  Nutrition counseling  Prostate cancer screening  Smoking cessation counseling  Patient Instructions (the written plan) were given to the patient.   Ree Edman, RN   05/19/2016

## 2016-05-19 NOTE — Progress Notes (Signed)
Care was provided under my supervision. I agree with the management as indicated in the note.  Abdulkareem Badolato DO  

## 2016-05-19 NOTE — Patient Instructions (Addendum)
  James [name] , Thank you for taking time to come for your Medicare Wellness Visit. I appreciate your ongoing commitment to your health goals. Please review the following plan we discussed and let me know if I can assist you in the future.   Cologuard as directed.   Follow up with Dr Caryl Bis as needed.  These are the goals we discussed: Goals    . Increase water intake,       This is a list of the screening recommended for you and due dates:  Health Maintenance  Topic Date Due  . HIV Screening  01/18/1981  . Tetanus Vaccine  01/18/1985  . Colon Cancer Screening  01/19/2016  . Flu Shot  Addressed

## 2016-05-24 DIAGNOSIS — Z8669 Personal history of other diseases of the nervous system and sense organs: Secondary | ICD-10-CM | POA: Diagnosis not present

## 2016-05-24 DIAGNOSIS — G7111 Myotonic muscular dystrophy: Secondary | ICD-10-CM | POA: Diagnosis not present

## 2016-05-30 ENCOUNTER — Ambulatory Visit (INDEPENDENT_AMBULATORY_CARE_PROVIDER_SITE_OTHER): Payer: Medicare HMO | Admitting: Family Medicine

## 2016-05-30 ENCOUNTER — Encounter: Payer: Self-pay | Admitting: Family Medicine

## 2016-05-30 DIAGNOSIS — K219 Gastro-esophageal reflux disease without esophagitis: Secondary | ICD-10-CM | POA: Diagnosis not present

## 2016-05-30 DIAGNOSIS — G4733 Obstructive sleep apnea (adult) (pediatric): Secondary | ICD-10-CM

## 2016-05-30 DIAGNOSIS — R1310 Dysphagia, unspecified: Secondary | ICD-10-CM | POA: Diagnosis not present

## 2016-05-30 NOTE — Assessment & Plan Note (Signed)
Controlled.  Continue CPAP. 

## 2016-05-30 NOTE — Progress Notes (Signed)
  [name], MD Phone: [phone]  James [name] is a 51 y.o. male who presents today for follow-up..  Patient reports no GERD symptoms. He has not been on medicine in a long time. No reflux. No food sticking sensation. Does note after he swallows large amounts of liquids he gets a cough. He had a swallowing test previously and has been evaluated by speech language pathology at home and has been advised to take small sips of water in the past. He notes he will cough after he swallows a large volume of liquid. No shortness of breath or fevers.  OSA: Using a CPAP nightly for 8-9 hours. He wakes well rested. Some daytime sleepiness though does not fall asleep easily.  Reports he discussed colon cancer screening and using cologuard during his Medicare wellness visit. He has not heard anything regarding this. He reports no blood in his stool. No history of colon cancer. He has never had a colonoscopy.  PMH: nonsmoker.   ROS see history of present illness  Objective  Physical Exam Vitals:   05/30/16 1050 05/30/16 1104  BP: 94/66 102/70  Pulse: 86   Temp: 98.2 F (36.8 C)     BP Readings from Last 3 Encounters:  05/30/16 102/70  05/19/16 126/82  04/07/16 118/80   Wt Readings from Last 3 Encounters:  05/30/16 198 lb 12.8 oz (90.2 kg)  05/19/16 196 lb (88.9 kg)  04/07/16 199 lb (90.3 kg)    Physical Exam  Constitutional: No distress.  HENT:  Head: Normocephalic and atraumatic.  Cardiovascular: Normal rate, regular rhythm and normal heart sounds.   Pulmonary/Chest: Effort normal and breath sounds normal.  Musculoskeletal: He exhibits no edema.  Neurological: He is alert. Gait normal.  Skin: Skin is warm and dry. He is not diaphoretic.     Assessment/Plan: Please see individual problem list.  GERD (gastroesophageal reflux disease) Asymptomatic. monitor for recurrence of symptoms.  Dysphagia Patient with what sounds to be possible dysphagia and aspiration when he  drinks large volumes of liquids. We will get him set up for follow-up with his neurologist at [facility] to evaluate this further. Per his report he has been evaluated for this in the past though has not always followed the advice of taking small sips all the time.  Obstructive sleep apnea Controlled. Continue CPAP.  Cologuard form signed.  [name], MD [place]

## 2016-05-30 NOTE — Assessment & Plan Note (Addendum)
Asymptomatic. monitor for recurrence of symptoms.

## 2016-05-30 NOTE — Assessment & Plan Note (Signed)
Patient with what sounds to be possible dysphagia and aspiration when he drinks large volumes of liquids. We will get him set up for follow-up with his neurologist at [facility] to evaluate this further. Per his report he has been evaluated for this in the past though has not always followed the advice of taking small sips all the time.

## 2016-05-30 NOTE — Patient Instructions (Addendum)
Nice to see you. We'll get you to see your neurologist for your swallowing difficulties to consider doing a swallowing study. Please continue your CPAP. We will check on your colon cancer screening as well.

## 2016-05-30 NOTE — Progress Notes (Signed)
Pre visit review using our clinic review tool, if applicable. No additional management support is needed unless otherwise documented below in the visit note. 

## 2016-05-31 NOTE — Progress Notes (Signed)
He has been rescheduled to March 16 to see Dr. Arby Barrette. James [name] is aware.

## 2016-06-05 DIAGNOSIS — G7111 Myotonic muscular dystrophy: Secondary | ICD-10-CM | POA: Diagnosis not present

## 2016-06-05 DIAGNOSIS — G4733 Obstructive sleep apnea (adult) (pediatric): Secondary | ICD-10-CM | POA: Diagnosis not present

## 2016-06-08 ENCOUNTER — Encounter: Payer: Self-pay | Admitting: Family Medicine

## 2016-06-08 DIAGNOSIS — Z1212 Encounter for screening for malignant neoplasm of rectum: Secondary | ICD-10-CM | POA: Diagnosis not present

## 2016-06-08 DIAGNOSIS — Z1211 Encounter for screening for malignant neoplasm of colon: Secondary | ICD-10-CM | POA: Diagnosis not present

## 2016-06-10 DIAGNOSIS — G7111 Myotonic muscular dystrophy: Secondary | ICD-10-CM | POA: Diagnosis not present

## 2016-06-10 DIAGNOSIS — I447 Left bundle-branch block, unspecified: Secondary | ICD-10-CM | POA: Diagnosis not present

## 2016-06-10 DIAGNOSIS — R1310 Dysphagia, unspecified: Secondary | ICD-10-CM | POA: Diagnosis not present

## 2016-06-16 LAB — COLOGUARD

## 2016-06-20 DIAGNOSIS — I429 Cardiomyopathy, unspecified: Secondary | ICD-10-CM | POA: Diagnosis not present

## 2016-06-20 DIAGNOSIS — R002 Palpitations: Secondary | ICD-10-CM | POA: Diagnosis not present

## 2016-06-20 DIAGNOSIS — I428 Other cardiomyopathies: Secondary | ICD-10-CM | POA: Diagnosis not present

## 2016-06-20 DIAGNOSIS — K219 Gastro-esophageal reflux disease without esophagitis: Secondary | ICD-10-CM | POA: Diagnosis not present

## 2016-06-20 DIAGNOSIS — R079 Chest pain, unspecified: Secondary | ICD-10-CM | POA: Diagnosis not present

## 2016-06-20 DIAGNOSIS — I447 Left bundle-branch block, unspecified: Secondary | ICD-10-CM | POA: Diagnosis not present

## 2016-06-20 DIAGNOSIS — J189 Pneumonia, unspecified organism: Secondary | ICD-10-CM | POA: Diagnosis not present

## 2016-06-20 DIAGNOSIS — Z79899 Other long term (current) drug therapy: Secondary | ICD-10-CM | POA: Diagnosis not present

## 2016-06-20 DIAGNOSIS — G7111 Myotonic muscular dystrophy: Secondary | ICD-10-CM | POA: Diagnosis not present

## 2016-06-21 ENCOUNTER — Telehealth: Payer: Self-pay | Admitting: Family Medicine

## 2016-06-21 DIAGNOSIS — R195 Other fecal abnormalities: Secondary | ICD-10-CM

## 2016-06-21 NOTE — Telephone Encounter (Signed)
Please let the patient know that his colon cancer screening test came back positive. I have referred him to GI for evaluation of this with the colonoscopy. Thanks.

## 2016-06-22 NOTE — Telephone Encounter (Signed)
Patient notified and is aware he will be referred

## 2016-06-23 DIAGNOSIS — R1310 Dysphagia, unspecified: Secondary | ICD-10-CM | POA: Diagnosis not present

## 2016-06-23 DIAGNOSIS — G7111 Myotonic muscular dystrophy: Secondary | ICD-10-CM | POA: Diagnosis not present

## 2016-06-23 DIAGNOSIS — R1312 Dysphagia, oropharyngeal phase: Secondary | ICD-10-CM | POA: Diagnosis not present

## 2016-07-01 DIAGNOSIS — R1312 Dysphagia, oropharyngeal phase: Secondary | ICD-10-CM | POA: Diagnosis not present

## 2016-07-06 DIAGNOSIS — G4733 Obstructive sleep apnea (adult) (pediatric): Secondary | ICD-10-CM | POA: Diagnosis not present

## 2016-07-06 DIAGNOSIS — G7111 Myotonic muscular dystrophy: Secondary | ICD-10-CM | POA: Diagnosis not present

## 2016-07-21 ENCOUNTER — Telehealth: Payer: Self-pay

## 2016-07-21 ENCOUNTER — Other Ambulatory Visit: Payer: Self-pay

## 2016-07-21 ENCOUNTER — Encounter: Payer: Self-pay | Admitting: Gastroenterology

## 2016-07-21 ENCOUNTER — Ambulatory Visit (INDEPENDENT_AMBULATORY_CARE_PROVIDER_SITE_OTHER): Payer: Medicare HMO | Admitting: Gastroenterology

## 2016-07-21 VITALS — BP 119/81 | HR 72 | Temp 98.2°F | Ht 71.0 in | Wt 198.6 lb

## 2016-07-21 DIAGNOSIS — R195 Other fecal abnormalities: Secondary | ICD-10-CM

## 2016-07-21 NOTE — Telephone Encounter (Signed)
Gastroenterology Pre-Procedure Review  Request Date: 5/22  Requesting Physician: Dr. Vicente Males  PATIENT REVIEW QUESTIONS: The patient responded to the following health history questions as indicated:    1. Are you having any GI issues? no 2. Do you have a personal history of Polyps? no 3. Do you have a family history of Colon Cancer or Polyps? no 4. Diabetes Mellitus? no 5. Joint replacements in the past 12 months?no 6. Major health problems in the past 3 months?yes (Muscular Dystrophy) 7. Any artificial heart valves, MVP, or defibrillator?yes (pacemaker)    MEDICATIONS & ALLERGIES:    Patient reports the following regarding taking any anticoagulation/antiplatelet therapy:   Plavix, Coumadin, Eliquis, Xarelto, Lovenox, Pradaxa, Brilinta, or Effient? no Aspirin? no  Patient confirms/reports the following medications:  Current Outpatient Prescriptions  Medication Sig Dispense Refill  . acetaminophen (TYLENOL) 325 MG tablet Take 650 mg by mouth every 6 (six) hours as needed.    Marland Kitchen albuterol (PROVENTIL HFA;VENTOLIN HFA) 108 (90 Base) MCG/ACT inhaler Inhale 2 puffs into the lungs every 4 (four) hours as needed for wheezing or shortness of breath. 1 Inhaler 0  . docusate sodium (COLACE) 50 MG capsule Take 50 mg by mouth as needed.     . loratadine (CLARITIN) 10 MG tablet Take 1 tablet (10 mg total) by mouth daily.    . Multiple Vitamin (MULTIVITAMIN) tablet Take 1 tablet by mouth daily.    . ondansetron (ZOFRAN ODT) 4 MG disintegrating tablet Allow 1-2 tablets to dissolve in your mouth every 8 hours as needed for nausea/vomiting 30 tablet 0   No current facility-administered medications for this visit.     Patient confirms/reports the following allergies:  Allergies  Allergen Reactions  . Codeine Nausea Only    No orders of the defined types were placed in this encounter.   AUTHORIZATION INFORMATION Primary Insurance: 1D#: Group #:  Secondary Insurance: 1D#: Group #:  SCHEDULE  INFORMATION: Date: 5/22 Time: Location: [place]

## 2016-07-21 NOTE — Progress Notes (Signed)
Gastroenterology Consultation  Referring Provider:     Leone Haven, MD Primary Care Physician:  [name], MD Primary Gastroenterologist:  Dr. Jonathon Bellows  Reason for Consultation:     Positive cologuard test         HPI:   James [name] is a 51 y.o. y/o male referred for consultation & management  by Dr. [name], MD.    He has been referred for a positive cologuard test.   He has never had a colonoscopy . No family history of colon cancer or polyps. One bowel movement a day , no blood in stool , no weight loss. No change in the shape of the stool. He suffers from myotonic dystrophy. He is awaiting a pacemaker   Past Medical History:  Diagnosis Date  . Bronchitis   . Cardiomyopathy ([place])   . GERD (gastroesophageal reflux disease)   . History of chicken pox   . History of pneumonia 2013   double PNA at [facility]  . LBBB (left bundle branch block)   . Myotonic dystrophy, type 1 ([place])    followed by [facility]    Past Surgical History:  Procedure Laterality Date  . US ECHOCARDIOGRAPHY  09/2010   EF 50%, mld LVH and dysfunction, mild RV dysfunction and enlarged    Prior to Admission medications   Medication Sig Start Date End Date Taking? Authorizing Provider  acetaminophen (TYLENOL) 325 MG tablet Take 650 mg by mouth every 6 (six) hours as needed.   Yes Historical Provider, MD  albuterol (PROVENTIL HFA;VENTOLIN HFA) 108 (90 Base) MCG/ACT inhaler Inhale 2 puffs into the lungs every 4 (four) hours as needed for wheezing or shortness of breath. 03/29/16  Yes Roderic Palau D Cuthriell, PA-C  docusate sodium (COLACE) 50 MG capsule Take 50 mg by mouth as needed.    Yes Historical Provider, MD  loratadine (CLARITIN) 10 MG tablet Take 1 tablet (10 mg total) by mouth daily. 04/13/15  Yes Leone Haven, MD  Multiple Vitamin (MULTIVITAMIN) tablet Take 1 tablet by mouth daily.   Yes Historical Provider, MD  ondansetron (ZOFRAN ODT) 4 MG disintegrating tablet Allow 1-2 tablets to  dissolve in your mouth every 8 hours as needed for nausea/vomiting 10/20/15  Yes Hinda Kehr, MD    Family History  Problem Relation Age of Onset  . Hypertension Father   . Cancer Paternal Grandfather     lung, smoker  . Cancer Paternal Uncle     bone  . Diabetes Mother   . CAD Mother 38    MI  . Stroke Neg Hx      Social History  Substance Use Topics  . Smoking status: Never Smoker  . Smokeless tobacco: Never Used  . Alcohol use No    Allergies as of 07/21/2016 - Review Complete 07/21/2016  Allergen Reaction Noted  . Codeine Nausea Only 05/31/2013    Review of Systems:    All systems reviewed and negative except where noted in HPI.   Physical Exam:  BP 119/81 (BP Location: Left Arm, Patient Position: Sitting, Cuff Size: Large)   Pulse 72   Temp 98.2 F (36.8 C) (Oral)   Ht 5\' 11"  (1.803 m)   Wt 198 lb 9.6 oz (90.1 kg)   BMI 27.70 kg/m  No LMP for male patient. Psych:  Alert and cooperative. Normal mood and affect. General:   Alert,  Well-developed, well-nourished, pleasant and cooperative in NAD. Very feeble voice,  Head:  Normocephalic and atraumatic. Eyes:  Sclera clear, no icterus.   Conjunctiva pink.B/l ptosis  Ears:  Normal auditory acuity. Nose:  No deformity, discharge, or lesions. Mouth:  No deformity or lesions,oropharynx pink & moist. Neck:  Supple; no masses or thyromegaly. Lungs:  Respirations even and unlabored.  Clear throughout to auscultation.   No wheezes, crackles, or rhonchi. No acute distress. Heart:  Regular rate and rhythm; no murmurs, clicks, rubs, or gallops. Abdomen:  Normal bowel sounds.  No bruits.  Soft, non-tender and non-distended without masses, hepatosplenomegaly or hernias noted.  No guarding or rebound tenderness.    Msk:  Decreased power in both arms  Extremities:  No clubbing or edema.  No cyanosis. Neurologic:  Alert and oriented x3;  grossly normal neurologically. Psych:  Alert and cooperative. Normal mood and  affect.  Imaging Studies: No results found.  Assessment and Plan:   James [name] is a 51 y.o. y/o male has been referred for a positive cologuard. Explained the next step is a diagnostic colonoscopy .  He suffers from muscular dystrophy and also has associated cardiac features . He says he is being evaluated for a pacemaker/ICD. We will get cardiac clearance to determine if he is ready for the colonoscopy and also have anesthesia review his chart to determine if he needs any other further testing . He also has OSA.  I have discussed alternative options, risks & benefits,  which include, but are not limited to, bleeding, infection, perforation,respiratory complication & drug reaction.  The patient agrees with this plan & written consent will be obtained.    Follow up as needed   Dr Jonathon Bellows MD

## 2016-07-22 ENCOUNTER — Telehealth: Payer: Self-pay | Admitting: Gastroenterology

## 2016-07-22 NOTE — Telephone Encounter (Signed)
07/22/16 Spoke with Juliann Pulse at [facility] and NO prior Josem Kaufmann is required for Colonoscopy [place] / R19.5 Ref#: [id number].

## 2016-07-29 ENCOUNTER — Telehealth: Payer: Self-pay

## 2016-07-29 DIAGNOSIS — Z87891 Personal history of nicotine dependence: Secondary | ICD-10-CM | POA: Diagnosis not present

## 2016-07-29 DIAGNOSIS — I517 Cardiomegaly: Secondary | ICD-10-CM | POA: Diagnosis not present

## 2016-07-29 DIAGNOSIS — I4510 Unspecified right bundle-branch block: Secondary | ICD-10-CM | POA: Diagnosis not present

## 2016-07-29 DIAGNOSIS — I447 Left bundle-branch block, unspecified: Secondary | ICD-10-CM | POA: Diagnosis not present

## 2016-07-29 DIAGNOSIS — G7111 Myotonic muscular dystrophy: Secondary | ICD-10-CM | POA: Diagnosis not present

## 2016-07-29 DIAGNOSIS — I428 Other cardiomyopathies: Secondary | ICD-10-CM | POA: Diagnosis not present

## 2016-07-29 DIAGNOSIS — I440 Atrioventricular block, first degree: Secondary | ICD-10-CM | POA: Diagnosis not present

## 2016-07-29 NOTE — Telephone Encounter (Signed)
1. Calling to advise patient that Medical clearance was obtained from Cardiologist for procedure.   2.  Advise patient of Pre-Admit Testing on 5/15 @ 9am: [facility] [place] [facility] [place], [place] [phone]  3. Due to patient not answering call. Mailed letter with information.

## 2016-08-01 DIAGNOSIS — I447 Left bundle-branch block, unspecified: Secondary | ICD-10-CM | POA: Diagnosis not present

## 2016-08-03 DIAGNOSIS — I447 Left bundle-branch block, unspecified: Secondary | ICD-10-CM | POA: Diagnosis not present

## 2016-08-04 DIAGNOSIS — G4733 Obstructive sleep apnea (adult) (pediatric): Secondary | ICD-10-CM | POA: Diagnosis not present

## 2016-08-05 DIAGNOSIS — G7111 Myotonic muscular dystrophy: Secondary | ICD-10-CM | POA: Diagnosis not present

## 2016-08-05 DIAGNOSIS — G4733 Obstructive sleep apnea (adult) (pediatric): Secondary | ICD-10-CM | POA: Diagnosis not present

## 2016-08-09 ENCOUNTER — Telehealth: Payer: Self-pay | Admitting: *Deleted

## 2016-08-09 ENCOUNTER — Telehealth: Payer: Self-pay | Admitting: Gastroenterology

## 2016-08-09 ENCOUNTER — Telehealth: Payer: Self-pay

## 2016-08-09 ENCOUNTER — Encounter
Admission: RE | Admit: 2016-08-09 | Discharge: 2016-08-09 | Disposition: A | Discharge status: Home / Self Care | Payer: Medicare HMO | Source: Ambulatory Visit | Attending: Gastroenterology | Admitting: Gastroenterology

## 2016-08-09 DIAGNOSIS — Z5309 Procedure and treatment not carried out because of other contraindication: Secondary | ICD-10-CM | POA: Insufficient documentation

## 2016-08-09 HISTORY — DX: Dysphagia, unspecified: R13.10

## 2016-08-09 HISTORY — DX: Pneumonia, unspecified organism: J18.9

## 2016-08-09 HISTORY — DX: Sleep apnea, unspecified: G47.30

## 2016-08-09 NOTE — Pre-Procedure Instructions (Signed)
Notified dr a Rosey Bath that patient scheduled for colonoscopy 08/16/16 and pacemaker 08/30/16. As instructed by dr Dorna Mai at dr Isidore Moos , gi, notified needs to have pacemaker before colonoscopy. Also notified jessica at pcp dr Caryl Bis. Also spoke with miriam at [facility].

## 2016-08-09 NOTE — Telephone Encounter (Signed)
Patient is returning your call.  

## 2016-08-09 NOTE — Telephone Encounter (Signed)
Left voice mail for pts wife to contact office to discuss colonoscopy not able to be done before the pacemaker has been place.

## 2016-08-09 NOTE — Telephone Encounter (Signed)
fyi

## 2016-08-09 NOTE — Pre-Procedure Instructions (Signed)
  ECG 12-lead5/06/2016 [place] Component Name Value Ref Range  Vent Rate (bpm) 76   PR Interval (msec) 254   QRS Interval (msec) 132   QT Interval (msec) 420   QTc (msec) 472   Result Narrative  Sinus rhythm 1st degree AV block Left axis deviation Right bundle branch block Left ventricular hypertrophy Abnormal ECG  When compared with ECG of 10-Jun-2016 09:20, Sinus rhythm has replaced Sinus bradycardia Left bundle branch block is now absent Right bundle branch block is now present Left ventricular hypertrophy is now present I reviewed and concur with this report. Electronically signed UL:[id number], MD, PAUL ([id number]) on 07/29/2016 10:29:53 PM

## 2016-08-09 NOTE — Telephone Encounter (Signed)
Received phone call from Judeen Hammans [facility] Anesthesia [phone]) regarding Dr. Christell Faith (Anesthesiologist) concern regarding performing Colonoscopy before he has Pacemaker.  Judeen Hammans stated that the anesthesiologist advises pt to have pacemaker in place before doing colonoscopy.  I have left voice mail for his Cardiologist and faxed a letter to inform him of this.  His cardiologist is Dr. Rich Fuchs (Fax # [phone].

## 2016-08-09 NOTE — Telephone Encounter (Signed)
Noted  

## 2016-08-09 NOTE — Telephone Encounter (Signed)
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  Contact Judeen Hammans [phone]

## 2016-08-09 NOTE — Telephone Encounter (Signed)
Returned call pts wife.  She said Cardiologist has cleared husband for Colonoscopy.  I've not been informed of this and will await to hear from cardiologist.

## 2016-08-10 ENCOUNTER — Telehealth: Payer: Self-pay | Admitting: *Deleted

## 2016-08-10 NOTE — Telephone Encounter (Signed)
Please advise 

## 2016-08-10 NOTE — Telephone Encounter (Signed)
error 

## 2016-08-10 NOTE — Telephone Encounter (Signed)
Patient's wife stated that pt is scheduled for a colon ostomy on 05/22, that is possibly cancelled, due to anesthesia request to have pace maker first. Anesthesiology questioned the importance of having this colon ostomy so soon. [name] spouse explained that he has a positive colongard. Cardiology cleared patient to have the colonostomy, however anesthesia will not agree.

## 2016-08-10 NOTE — Telephone Encounter (Signed)
They need to contact his cardiologist and let them know. The anesthesiologist and procedure center should have sent a clearance form to cardiology for this. Thanks.

## 2016-08-11 ENCOUNTER — Encounter: Payer: Self-pay | Admitting: Family Medicine

## 2016-08-11 NOTE — Telephone Encounter (Signed)
Patient is scheduled for pace maker on June 15th

## 2016-08-12 ENCOUNTER — Telehealth: Payer: Self-pay | Admitting: Gastroenterology

## 2016-08-12 ENCOUNTER — Telehealth: Payer: Self-pay

## 2016-08-12 ENCOUNTER — Other Ambulatory Visit: Payer: Self-pay

## 2016-08-12 NOTE — Telephone Encounter (Signed)
Received notification from Pre-Procedure office stating Dr. Anne Fu (anesthesia) spoke with [facility] and pt has been cleared for procedure.  Pt was rescheduled.   Notified spouse of date and prep.

## 2016-08-12 NOTE — Telephone Encounter (Signed)
Patient's wife called and [facility] has cleared Mr. Whittaker for his Colonoscopy.  Call Gerald Stabs [phone] w/ [facility] if you have any questions regarding clearance.  She also wants to know when his procedure date has been scheduled for.

## 2016-08-12 NOTE — Telephone Encounter (Signed)
Noted  

## 2016-08-15 ENCOUNTER — Encounter: Payer: Self-pay | Admitting: *Deleted

## 2016-08-16 ENCOUNTER — Ambulatory Visit: Payer: Medicare HMO | Admitting: Certified Registered Nurse Anesthetist

## 2016-08-16 ENCOUNTER — Ambulatory Visit: Admission: RE | Admit: 2016-08-16 | Payer: Medicare HMO | Source: Ambulatory Visit | Admitting: Gastroenterology

## 2016-08-16 ENCOUNTER — Encounter: Admission: RE | Payer: Self-pay | Source: Ambulatory Visit

## 2016-08-16 ENCOUNTER — Encounter
Admission: RE | Disposition: A | Discharge status: Home / Self Care | Payer: Self-pay | Source: Ambulatory Visit | Attending: Gastroenterology

## 2016-08-16 ENCOUNTER — Encounter: Payer: Self-pay | Admitting: Anesthesiology

## 2016-08-16 ENCOUNTER — Ambulatory Visit
Admission: RE | Admit: 2016-08-16 | Discharge: 2016-08-16 | Disposition: A | Discharge status: Home / Self Care | Payer: Medicare HMO | Source: Ambulatory Visit | Attending: Gastroenterology | Admitting: Gastroenterology

## 2016-08-16 DIAGNOSIS — R1310 Dysphagia, unspecified: Secondary | ICD-10-CM | POA: Diagnosis not present

## 2016-08-16 DIAGNOSIS — D120 Benign neoplasm of cecum: Secondary | ICD-10-CM | POA: Insufficient documentation

## 2016-08-16 DIAGNOSIS — D125 Benign neoplasm of sigmoid colon: Secondary | ICD-10-CM

## 2016-08-16 DIAGNOSIS — G4730 Sleep apnea, unspecified: Secondary | ICD-10-CM | POA: Diagnosis not present

## 2016-08-16 DIAGNOSIS — K621 Rectal polyp: Secondary | ICD-10-CM | POA: Diagnosis not present

## 2016-08-16 DIAGNOSIS — R195 Other fecal abnormalities: Secondary | ICD-10-CM | POA: Diagnosis not present

## 2016-08-16 DIAGNOSIS — K921 Melena: Secondary | ICD-10-CM | POA: Diagnosis not present

## 2016-08-16 DIAGNOSIS — Z8249 Family history of ischemic heart disease and other diseases of the circulatory system: Secondary | ICD-10-CM | POA: Diagnosis not present

## 2016-08-16 DIAGNOSIS — Z885 Allergy status to narcotic agent status: Secondary | ICD-10-CM | POA: Insufficient documentation

## 2016-08-16 DIAGNOSIS — Z808 Family history of malignant neoplasm of other organs or systems: Secondary | ICD-10-CM | POA: Insufficient documentation

## 2016-08-16 DIAGNOSIS — I447 Left bundle-branch block, unspecified: Secondary | ICD-10-CM | POA: Diagnosis not present

## 2016-08-16 DIAGNOSIS — I429 Cardiomyopathy, unspecified: Secondary | ICD-10-CM | POA: Insufficient documentation

## 2016-08-16 DIAGNOSIS — K635 Polyp of colon: Secondary | ICD-10-CM | POA: Diagnosis not present

## 2016-08-16 DIAGNOSIS — Z833 Family history of diabetes mellitus: Secondary | ICD-10-CM | POA: Diagnosis not present

## 2016-08-16 DIAGNOSIS — G7111 Myotonic muscular dystrophy: Secondary | ICD-10-CM | POA: Diagnosis not present

## 2016-08-16 DIAGNOSIS — Z801 Family history of malignant neoplasm of trachea, bronchus and lung: Secondary | ICD-10-CM | POA: Insufficient documentation

## 2016-08-16 DIAGNOSIS — Z79899 Other long term (current) drug therapy: Secondary | ICD-10-CM | POA: Diagnosis not present

## 2016-08-16 DIAGNOSIS — Z823 Family history of stroke: Secondary | ICD-10-CM | POA: Diagnosis not present

## 2016-08-16 DIAGNOSIS — D128 Benign neoplasm of rectum: Secondary | ICD-10-CM | POA: Diagnosis not present

## 2016-08-16 HISTORY — PX: COLONOSCOPY WITH PROPOFOL: SHX5780

## 2016-08-16 LAB — HM COLONOSCOPY

## 2016-08-16 SURGERY — COLONOSCOPY WITH PROPOFOL
Anesthesia: General

## 2016-08-16 MED ORDER — PHENYLEPHRINE HCL 10 MG/ML IJ SOLN
INTRAMUSCULAR | Status: DC | PRN
  Administered 2016-08-16: 200 ug via INTRAVENOUS
  Administered 2016-08-16: 100 ug via INTRAVENOUS
  Administered 2016-08-16: 200 ug via INTRAVENOUS

## 2016-08-16 MED ORDER — PROPOFOL 500 MG/50ML IV EMUL
INTRAVENOUS | Status: DC | PRN
  Administered 2016-08-16: 150 ug/kg/min via INTRAVENOUS

## 2016-08-16 MED ORDER — SODIUM CHLORIDE 0.9 % IV SOLN
INTRAVENOUS | Status: DC
  Administered 2016-08-16: 09:00:00 via INTRAVENOUS

## 2016-08-16 MED ORDER — PROPOFOL 10 MG/ML IV BOLUS
INTRAVENOUS | Status: DC | PRN
  Administered 2016-08-16: 30 mg via INTRAVENOUS
  Administered 2016-08-16 (×2): 20 mg via INTRAVENOUS
  Administered 2016-08-16: 50 mg via INTRAVENOUS

## 2016-08-16 MED ORDER — METHYLENE BLUE 0.5 % INJ SOLN
INTRAVENOUS | Status: DC | PRN
  Administered 2016-08-16: 3 mL via SUBMUCOSAL
  Administered 2016-08-16: 10 mL via SUBMUCOSAL

## 2016-08-16 MED ORDER — EPHEDRINE SULFATE 50 MG/ML IJ SOLN
INTRAMUSCULAR | Status: DC | PRN
  Administered 2016-08-16 (×3): 15 mg via INTRAVENOUS
  Administered 2016-08-16: 5 mg via INTRAVENOUS

## 2016-08-16 MED ORDER — METHYLENE BLUE 0.5 % INJ SOLN
INTRAVENOUS | Status: AC
  Filled 2016-08-16: qty 10

## 2016-08-16 NOTE — H&P (Signed)
Jonathon Bellows MD [address], [place], [place] [address] Phone: [phone] Fax : [phone]  Primary Care Physician:  Leone Haven, MD Primary Gastroenterologist:  Dr. Jonathon Bellows   Pre-Procedure History & Physical: HPI:  James [name] is a 51 y.o. male is here for an colonoscopy.   Past Medical History:  Diagnosis Date  . Bronchitis   . Cardiomyopathy ([place])   . Dysphagia   . GERD (gastroesophageal reflux disease)   . History of chicken pox   . History of pneumonia 2013   double PNA at [facility]  . LBBB (left bundle branch block)   . Myotonic dystrophy, type 1 ([place])    followed by [facility]  . Pneumonia    aspiration  . Sleep apnea     Past Surgical History:  Procedure Laterality Date  . US ECHOCARDIOGRAPHY  09/2010   EF 50%, mld LVH and dysfunction, mild RV dysfunction and enlarged    Prior to Admission medications   Medication Sig Start Date End Date Taking? Authorizing Provider  acetaminophen (TYLENOL) 325 MG tablet Take 650 mg by mouth every 6 (six) hours as needed.   Yes [provider]  albuterol (PROVENTIL HFA;VENTOLIN HFA) 108 (90 Base) MCG/ACT inhaler Inhale 2 puffs into the lungs every 4 (four) hours as needed for wheezing or shortness of breath. 03/29/16  Yes Cuthriell, Charline Bills, PA-C  docusate sodium (COLACE) 50 MG capsule Take 50 mg by mouth as needed.    Yes [provider]  loratadine (CLARITIN) 10 MG tablet Take 1 tablet (10 mg total) by mouth daily. 04/13/15  Yes Leone Haven, MD  Multiple Vitamin (MULTIVITAMIN) tablet Take 1 tablet by mouth daily.   Yes [provider]  ondansetron (ZOFRAN ODT) 4 MG disintegrating tablet Allow 1-2 tablets to dissolve in your mouth every 8 hours as needed for nausea/vomiting 10/20/15  Yes Hinda Kehr, MD    Allergies as of 08/12/2016 - Review Complete 08/09/2016  Allergen Reaction Noted  . Codeine Nausea Only 05/31/2013    Family History  Problem Relation Age of Onset  .  Hypertension Father   . Cancer Paternal Grandfather        lung, smoker  . Cancer Paternal Uncle        bone  . Diabetes Mother   . CAD Mother 96       MI  . Stroke Neg Hx     Social History   Social History  . Marital status: Married    Spouse name: N/A  . Number of children: N/A  . Years of education: N/A   Occupational History  . Not on file.   Social History Main Topics  . Smoking status: Never Smoker  . Smokeless tobacco: Never Used  . Alcohol use No  . Drug use: No  . Sexual activity: Not on file   Other Topics Concern  . 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 Neck:  Supple; no masses or thyromegaly. Lungs:  Clear throughout to auscultation.    Heart:  Regular rate and rhythm. Abdomen:  Soft, nontender and nondistended. Normal bowel sounds, without guarding, and without rebound.   Neurologic:  Alert and  oriented x4;  grossly normal neurologically.  Impression/Plan: [name] is here for an colonoscopy to be performed for a positive cologuard test  Risks, benefits, limitations, and alternatives regarding  colonoscopy have been reviewed with the patient.  Questions have been answered.  All parties agreeable.   Jonathon Bellows, MD  08/16/2016, 9:04 AM

## 2016-08-16 NOTE — Anesthesia Post-op Follow-up Note (Cosign Needed)
Anesthesia QCDR form completed.        

## 2016-08-16 NOTE — Anesthesia Preprocedure Evaluation (Signed)
Anesthesia Evaluation  Patient identified by MRN, date of birth, ID band Patient awake    Reviewed: Allergy & Precautions, NPO status , Patient's Chart, lab work & pertinent test results, reviewed documented beta blocker date and time   Airway Mallampati: II  TM Distance: >3 FB     Dental  (+) Chipped   Pulmonary sleep apnea , pneumonia, resolved,           Cardiovascular + dysrhythmias      Neuro/Psych  Neuromuscular disease    GI/Hepatic GERD  ,  Endo/Other    Renal/GU      Musculoskeletal  (+) Arthritis ,   Abdominal   Peds  Hematology   Anesthesia Other Findings Lbbb.  Reproductive/Obstetrics                             Anesthesia Physical Anesthesia Plan  ASA: II  Anesthesia Plan: General   Post-op Pain Management:    Induction: Intravenous  Airway Management Planned:   Additional Equipment:   Intra-op Plan:   Post-operative Plan:   Informed Consent: I have reviewed the patients History and Physical, chart, labs and discussed the procedure including the risks, benefits and alternatives for the proposed anesthesia with the patient or authorized representative who has indicated his/her understanding and acceptance.     Plan Discussed with: CRNA  Anesthesia Plan Comments:         Anesthesia Quick Evaluation

## 2016-08-16 NOTE — Op Note (Signed)
[facility] Gastroenterology Patient Name: James [name] Procedure Date: 08/16/2016 9:11 AM MRN: [id number] Account #: [id number] Date of Birth: [birth year] Admit Type: Ambulatory Age: 51 Room: [facility] ENDO ROOM 1 Gender: Male Note Status: Finalized Procedure:            Colonoscopy Indications:          Positive Cologuard test Providers:            Jonathon Bellows MD, MD Referring MD:         Angela Adam. Caryl Bis (Referring MD) Medicines:            Monitored Anesthesia Care Complications:        No immediate complications. Procedure:            Pre-Anesthesia Assessment:                       - Prior to the procedure, a History and Physical was                        performed, and patient medications, allergies and                        sensitivities were reviewed. The patient's tolerance of                        previous anesthesia was reviewed.                       - The risks and benefits of the procedure and the                        sedation options and risks were discussed with the                        patient. All questions were answered and informed                        consent was obtained.                       - ASA Grade Assessment: III - A patient with severe                        systemic disease.                       After obtaining informed consent, the colonoscope was                        passed under direct vision. Throughout the procedure,                        the patient's blood pressure, pulse, and oxygen                        saturations were monitored continuously. The Olympus                        CF-H180AL colonoscope ( S#: [id number] ) was introduced  through the anus and advanced to the the cecum,                        identified by the appendiceal orifice, IC valve and                        transillumination. The colonoscopy was performed with                        ease. The patient tolerated the procedure  well. The                        quality of the bowel preparation was good. Findings:      The perianal and digital rectal examinations were normal.      Four sessile polyps were found in the rectum, sigmoid colon and cecum.       The polyps were 5 to 7 mm in size. These polyps were removed with a cold       snare. Resection and retrieval were complete.      A 20 mm polyp was found in the sigmoid colon. The polyp was       semi-sessile. Preparations were made for mucosal resection. Chromoscopy       with Niger ink was done to mark the borders of the lesion. 3 mL of       saline with methylene blue was injected with adequate lift of the lesion       from the muscularis propria. Snare mucosal resection was performed. A 22       mm area was resected. Two pieces were resected in total. Resection and       retrieval were complete. There was no bleeding during, and at the end,       of the procedure. To prevent bleeding after mucosal resection, two       hemostatic clips were successfully placed. There was no bleeding during,       or at the end, of the procedure.      The exam was otherwise without abnormality on direct and retroflexion       views. Impression:           - Four 5 to 7 mm polyps in the rectum, in the sigmoid                        colon and in the cecum, removed with a cold snare.                        Resected and retrieved.                       - One 20 mm polyp in the sigmoid colon, removed with                        mucosal resection. Resected and retrieved. Clips were                        placed.                       - The examination was otherwise normal on direct and  retroflexion views.                       - Mucosal resection was performed. Resection and                        retrieval were complete. Recommendation:       - Discharge patient to home (with escort).                       - Resume previous diet.                       - No  ibuprofen, naproxen, or other non-steroidal                        anti-inflammatory drugs for 6 weeks after polyp removal.                       - Await pathology results.                       - Repeat colonoscopy in 6 months for surveillance after                        piecemeal polypectomy. Procedure Code(s):    --- Professional ---                       [id number], 59, Colonoscopy, flexible; with endoscopic                        mucosal resection                       [phone], Colonoscopy, flexible; with removal of tumor(s),                        polyp(s), or other lesion(s) by snare technique Diagnosis Code(s):    --- Professional ---                       K62.1, Rectal polyp                       D12.5, Benign neoplasm of sigmoid colon                       D12.0, Benign neoplasm of cecum                       R19.5, Other fecal abnormalities CPT copyright 2016 American Medical Association. All rights reserved. The codes documented in this report are preliminary and upon coder review may  be revised to meet current compliance requirements. Jonathon Bellows, MD Jonathon Bellows MD, MD 08/16/2016 10:07:40 AM This report has been signed electronically. Number of Addenda: 0 Note Initiated On: 08/16/2016 9:11 AM Scope Withdrawal Time: 0 hours 37 minutes 43 seconds  Total Procedure Duration: 0 hours 45 minutes 4 seconds       [facility]

## 2016-08-16 NOTE — Anesthesia Postprocedure Evaluation (Signed)
Anesthesia Post Note  Patient: James [name]  Procedure(s) Performed: Procedure(s) (LRB): COLONOSCOPY WITH PROPOFOL (N/A)  Patient location during evaluation: Endoscopy Anesthesia Type: General Level of consciousness: awake and alert Pain management: pain level controlled Vital Signs Assessment: post-procedure vital signs reviewed and stable Respiratory status: spontaneous breathing, nonlabored ventilation, respiratory function stable and patient connected to nasal cannula oxygen Cardiovascular status: blood pressure returned to baseline and stable Postop Assessment: no signs of nausea or vomiting Anesthetic complications: no     Last Vitals:  Vitals:   08/16/16 1032 08/16/16 1042  BP: 106/62 113/65  Pulse: 75 70  Resp: 18 13  Temp:      Last Pain:  Vitals:   08/16/16 1012  TempSrc: Tympanic                 Jerryl Holzhauer S

## 2016-08-16 NOTE — Transfer of Care (Signed)
Immediate Anesthesia Transfer of Care Note  Patient: James [name]  Procedure(s) Performed: Procedure(s): COLONOSCOPY WITH PROPOFOL (N/A)  Patient Location: PACU  Anesthesia Type:General  Level of Consciousness: awake, alert , oriented and patient cooperative  Airway & Oxygen Therapy: Patient Spontanous Breathing and Patient connected to nasal cannula oxygen  Post-op Assessment: Report given to RN and Post -op Vital signs reviewed and stable  Post vital signs: Reviewed and stable  Last Vitals:  Vitals:   08/16/16 0839 08/16/16 1012  BP:  (!) 113/51  Pulse:  75  Resp:    Temp: 36.9 C 36.1 C    Last Pain:  Vitals:   08/16/16 1012  TempSrc: Tympanic         Complications: No apparent anesthesia complications

## 2016-08-17 LAB — SURGICAL PATHOLOGY

## 2016-08-18 ENCOUNTER — Encounter: Payer: Self-pay | Admitting: Gastroenterology

## 2016-08-24 ENCOUNTER — Telehealth: Payer: Self-pay

## 2016-08-24 NOTE — Telephone Encounter (Signed)
-----   Message from Jonathon Bellows, MD sent at 08/22/2016  6:53 PM EDT ----- Inform polyps were pre cancerous - repeat in 6 months as it was excised piece meal

## 2016-08-24 NOTE — Telephone Encounter (Signed)
Advised spouse of results per Dr. Vicente Males.   Inform polyps were pre cancerous - repeat in 6 months as it was excised piece meal

## 2016-08-29 ENCOUNTER — Encounter: Payer: Self-pay | Admitting: Family Medicine

## 2016-08-29 ENCOUNTER — Ambulatory Visit (INDEPENDENT_AMBULATORY_CARE_PROVIDER_SITE_OTHER): Payer: Medicare HMO | Admitting: Family Medicine

## 2016-08-29 DIAGNOSIS — H6992 Unspecified Eustachian tube disorder, left ear: Secondary | ICD-10-CM | POA: Insufficient documentation

## 2016-08-29 DIAGNOSIS — H6982 Other specified disorders of Eustachian tube, left ear: Secondary | ICD-10-CM

## 2016-08-29 NOTE — Progress Notes (Signed)
  [name], MD Phone: [phone]  James [name] is a 51 y.o. male who presents today for same-day visit.  Patient notes left ear popping and crackling over the last week. No ear pain. No shortness of breath. No congestion. No sinus congestion. No sore throat. No postnasal drip. No hearing loss. He's been taking Claritin.  ROS see history of present illness  Objective  Physical Exam Vitals:   08/29/16 1334  BP: 120/72  Pulse: 73  Temp: 98 F (36.7 C)    BP Readings from Last 3 Encounters:  08/29/16 120/72  08/16/16 113/65  08/09/16 94/67   Wt Readings from Last 3 Encounters:  08/29/16 197 lb 3.2 oz (89.4 kg)  08/16/16 198 lb (89.8 kg)  08/09/16 198 lb (89.8 kg)    Physical Exam  Constitutional: No distress.  HENT:  Head: Normocephalic and atraumatic.  Mouth/Throat: Oropharynx is clear and moist. No oropharyngeal exudate.  Normal TMs  Eyes: Conjunctivae are normal. Pupils are equal, round, and reactive to light.  Neck: Neck supple.  Cardiovascular: Normal rate, regular rhythm and normal heart sounds.   Pulmonary/Chest: Effort normal and breath sounds normal.  Lymphadenopathy:    He has no cervical adenopathy.  Skin: He is not diaphoretic.     Assessment/Plan: Please see individual problem list.  Eustachian tube dysfunction, left Symptoms most consistent with eustachian tube dysfunction. Benign exam today. No symptoms other than crackling in his ear. He'll continue Claritin. Tried nasal saline rinses. If not improving could have him see ENT.   [name], MD [place]

## 2016-08-29 NOTE — Patient Instructions (Signed)
Nice to see you. Your symptoms are likely related to eustachian tube dysfunction. I would suggest continuing Claritin. You should use nasal saline rinses as well. If you develop upper respiratory symptoms, congestion, trouble breathing, or new symptoms please seek medical attention.

## 2016-08-29 NOTE — Assessment & Plan Note (Signed)
Symptoms most consistent with eustachian tube dysfunction. Benign exam today. No symptoms other than crackling in his ear. He'll continue Claritin. Tried nasal saline rinses. If not improving could have him see ENT.

## 2016-09-08 DIAGNOSIS — I428 Other cardiomyopathies: Secondary | ICD-10-CM | POA: Diagnosis not present

## 2016-09-08 DIAGNOSIS — Z5181 Encounter for therapeutic drug level monitoring: Secondary | ICD-10-CM | POA: Diagnosis not present

## 2016-09-08 DIAGNOSIS — G4733 Obstructive sleep apnea (adult) (pediatric): Secondary | ICD-10-CM | POA: Diagnosis not present

## 2016-09-08 DIAGNOSIS — Z79899 Other long term (current) drug therapy: Secondary | ICD-10-CM | POA: Diagnosis not present

## 2016-09-08 DIAGNOSIS — I447 Left bundle-branch block, unspecified: Secondary | ICD-10-CM | POA: Diagnosis not present

## 2016-09-09 DIAGNOSIS — R0789 Other chest pain: Secondary | ICD-10-CM | POA: Diagnosis not present

## 2016-09-09 DIAGNOSIS — G7111 Myotonic muscular dystrophy: Secondary | ICD-10-CM | POA: Diagnosis not present

## 2016-09-09 DIAGNOSIS — G710 Muscular dystrophy: Secondary | ICD-10-CM | POA: Diagnosis not present

## 2016-09-09 DIAGNOSIS — I428 Other cardiomyopathies: Secondary | ICD-10-CM | POA: Diagnosis not present

## 2016-09-09 DIAGNOSIS — G4733 Obstructive sleep apnea (adult) (pediatric): Secondary | ICD-10-CM | POA: Diagnosis not present

## 2016-09-09 DIAGNOSIS — R9431 Abnormal electrocardiogram [ECG] [EKG]: Secondary | ICD-10-CM | POA: Diagnosis not present

## 2016-09-09 DIAGNOSIS — Z006 Encounter for examination for normal comparison and control in clinical research program: Secondary | ICD-10-CM | POA: Diagnosis not present

## 2016-09-09 DIAGNOSIS — I447 Left bundle-branch block, unspecified: Secondary | ICD-10-CM | POA: Diagnosis not present

## 2016-09-09 DIAGNOSIS — I420 Dilated cardiomyopathy: Secondary | ICD-10-CM | POA: Diagnosis not present

## 2016-09-10 DIAGNOSIS — R918 Other nonspecific abnormal finding of lung field: Secondary | ICD-10-CM | POA: Diagnosis not present

## 2016-09-10 DIAGNOSIS — Z006 Encounter for examination for normal comparison and control in clinical research program: Secondary | ICD-10-CM | POA: Diagnosis not present

## 2016-09-10 DIAGNOSIS — R9431 Abnormal electrocardiogram [ECG] [EKG]: Secondary | ICD-10-CM | POA: Insufficient documentation

## 2016-09-10 DIAGNOSIS — G4733 Obstructive sleep apnea (adult) (pediatric): Secondary | ICD-10-CM | POA: Diagnosis not present

## 2016-09-10 DIAGNOSIS — I428 Other cardiomyopathies: Secondary | ICD-10-CM | POA: Diagnosis not present

## 2016-09-10 DIAGNOSIS — G7111 Myotonic muscular dystrophy: Secondary | ICD-10-CM | POA: Diagnosis not present

## 2016-09-10 DIAGNOSIS — R0789 Other chest pain: Secondary | ICD-10-CM | POA: Diagnosis not present

## 2016-09-10 DIAGNOSIS — I447 Left bundle-branch block, unspecified: Secondary | ICD-10-CM | POA: Diagnosis not present

## 2016-09-23 ENCOUNTER — Encounter: Payer: Self-pay | Admitting: Family Medicine

## 2016-09-23 ENCOUNTER — Telehealth: Payer: Self-pay | Admitting: *Deleted

## 2016-09-23 ENCOUNTER — Ambulatory Visit (INDEPENDENT_AMBULATORY_CARE_PROVIDER_SITE_OTHER): Payer: Medicare HMO | Admitting: Family Medicine

## 2016-09-23 DIAGNOSIS — Z95810 Presence of automatic (implantable) cardiac defibrillator: Secondary | ICD-10-CM | POA: Diagnosis not present

## 2016-09-23 NOTE — Telephone Encounter (Signed)
James [name], pt will come in for appt

## 2016-09-23 NOTE — Patient Instructions (Signed)
Nice to see you. Your pacemaker site looks to be healing well. Please monitor for signs of infection. If you develop fevers, drainage, redness, swelling, pain, warmth, or any other symptoms please seek medical attention.

## 2016-09-23 NOTE — Assessment & Plan Note (Addendum)
Patient status post ICD/pacemaker placement. He has done well since then. Has follow-up with cardiology in one month. Wound appears well healing with no signs of infection. He'll continue to monitor the area. He'll monitor to see if his cough and breathing issues return. He'll continue his allergy medications. Given return precautions.

## 2016-09-23 NOTE — Progress Notes (Signed)
  [name], MD Phone: [phone]  James [name] is a 51 y.o. male who presents today for follow-up.  Patient presents today for pacemaker wound check. Patient had pacemaker/ICD placed 09/09/16. He notes he has done well since then. Notes the swelling has been improving. He has no fevers, drainage, redness, or warmth. He's had no palpitations or chest pain. He did recently have a little cough with a little mild shortness of breath though this resolved and he believes it may be allergy related.  ROS see history of present illness  Objective  Physical Exam Vitals:   09/23/16 1131  BP: 110/72  Pulse: 83  Temp: 98.1 F (36.7 C)    BP Readings from Last 3 Encounters:  09/23/16 110/72  08/29/16 120/72  08/16/16 113/65   Wt Readings from Last 3 Encounters:  09/23/16 196 lb 12.8 oz (89.3 kg)  08/29/16 197 lb 3.2 oz (89.4 kg)  08/16/16 198 lb (89.8 kg)    Physical Exam  Constitutional: No distress.  Cardiovascular: Normal rate, regular rhythm and normal heart sounds.   Pulmonary/Chest: Effort normal and breath sounds normal.  Skin: He is not diaphoretic.        Assessment/Plan: Please see individual problem list.  ICD (implantable cardioverter-defibrillator) in place Patient status post ICD/pacemaker placement. He has done well since then. Has follow-up with cardiology in one month. Wound appears well healing with no signs of infection. He'll continue to monitor the area. He'll monitor to see if his cough and breathing issues return. He'll continue his allergy medications. Given return precautions.  [name], MD [place]

## 2016-10-24 DIAGNOSIS — K219 Gastro-esophageal reflux disease without esophagitis: Secondary | ICD-10-CM | POA: Diagnosis not present

## 2016-10-24 DIAGNOSIS — R002 Palpitations: Secondary | ICD-10-CM | POA: Diagnosis not present

## 2016-10-24 DIAGNOSIS — Z95810 Presence of automatic (implantable) cardiac defibrillator: Secondary | ICD-10-CM | POA: Diagnosis not present

## 2016-10-24 DIAGNOSIS — R9431 Abnormal electrocardiogram [ECG] [EKG]: Secondary | ICD-10-CM | POA: Diagnosis not present

## 2016-10-24 DIAGNOSIS — I447 Left bundle-branch block, unspecified: Secondary | ICD-10-CM | POA: Diagnosis not present

## 2016-10-24 DIAGNOSIS — G4733 Obstructive sleep apnea (adult) (pediatric): Secondary | ICD-10-CM | POA: Diagnosis not present

## 2016-10-24 DIAGNOSIS — I428 Other cardiomyopathies: Secondary | ICD-10-CM | POA: Diagnosis not present

## 2016-11-30 ENCOUNTER — Ambulatory Visit: Payer: Medicare HMO | Admitting: Family Medicine

## 2016-11-30 DIAGNOSIS — R4702 Dysphasia: Secondary | ICD-10-CM | POA: Diagnosis not present

## 2016-11-30 DIAGNOSIS — R1314 Dysphagia, pharyngoesophageal phase: Secondary | ICD-10-CM | POA: Diagnosis not present

## 2016-12-02 DIAGNOSIS — Z95810 Presence of automatic (implantable) cardiac defibrillator: Secondary | ICD-10-CM | POA: Diagnosis not present

## 2016-12-02 DIAGNOSIS — I447 Left bundle-branch block, unspecified: Secondary | ICD-10-CM | POA: Diagnosis not present

## 2016-12-02 DIAGNOSIS — I428 Other cardiomyopathies: Secondary | ICD-10-CM | POA: Diagnosis not present

## 2016-12-09 ENCOUNTER — Ambulatory Visit: Payer: Medicare HMO | Admitting: Family Medicine

## 2016-12-12 ENCOUNTER — Encounter: Payer: Self-pay | Admitting: Family Medicine

## 2016-12-12 ENCOUNTER — Ambulatory Visit (INDEPENDENT_AMBULATORY_CARE_PROVIDER_SITE_OTHER): Payer: Medicare HMO | Admitting: Family Medicine

## 2016-12-12 DIAGNOSIS — G4733 Obstructive sleep apnea (adult) (pediatric): Secondary | ICD-10-CM

## 2016-12-12 DIAGNOSIS — R6883 Chills (without fever): Secondary | ICD-10-CM | POA: Insufficient documentation

## 2016-12-12 DIAGNOSIS — K219 Gastro-esophageal reflux disease without esophagitis: Secondary | ICD-10-CM

## 2016-12-12 DIAGNOSIS — I429 Cardiomyopathy, unspecified: Secondary | ICD-10-CM

## 2016-12-12 NOTE — Assessment & Plan Note (Signed)
Asymptomatic. Weight is up a couple of pounds. He'll monitor for symptoms. Continue to follow with cardiology.

## 2016-12-12 NOTE — Assessment & Plan Note (Signed)
No recurrence. 

## 2016-12-12 NOTE — Progress Notes (Signed)
  James Rumps, MD Phone: [phone]  James [name] is a 51 y.o. male who presents today for f/u.  Cardiomyopathy: Doing well with this. No swelling. No orthopnea. He has not had any issues with his pacemaker. He seen his surgeon recently.  OSA: Patient has not been using his CPAP. He is concerned about infection related to this though he does know how to clean it. He does have issues getting lung infections easily. He is willing to use his CPAP.  Patient reports Friday he developed chills and then on Saturday he had a temperature of 100F. He notes no other symptoms with this. No nausea, vomiting, diarrhea, shortness breath, cough, abdominal pain, rash, dysuria, or tick exposure. He is felt well the last 2 days.  Patient notes no recurrent GERD symptoms.  PMH: nonsmoker.   ROS see history of present illness  Objective  Physical Exam Vitals:   12/12/16 1336  BP: 128/80  Pulse: 89  Temp: 97.8 F (36.6 C)  SpO2: 94%    BP Readings from Last 3 Encounters:  12/12/16 128/80  09/23/16 110/72  08/29/16 120/72   Wt Readings from Last 3 Encounters:  12/12/16 201 lb 9.6 oz (91.4 kg)  09/23/16 196 lb 12.8 oz (89.3 kg)  08/29/16 197 lb 3.2 oz (89.4 kg)    Physical Exam  Constitutional: No distress.  HENT:  Head: Normocephalic and atraumatic.  Mouth/Throat: Oropharynx is clear and moist. No oropharyngeal exudate.  Normal TMs  Eyes: Pupils are equal, round, and reactive to light. Conjunctivae are normal.  Cardiovascular: Normal rate, regular rhythm and normal heart sounds.   Pulmonary/Chest: Effort normal.  Minimal faint crackles bibasilar bases  Abdominal: Soft. Bowel sounds are normal. He exhibits no distension. There is no tenderness. There is no rebound and no guarding.  Musculoskeletal: He exhibits no edema.  Neurological: He is alert. Gait normal.  Skin: Skin is warm and dry. He is not diaphoretic.   pacemaker site appears well-healed with no signs of  infection.   Assessment/Plan: Please see individual problem list.  Cardiomyopathy ([place]) Asymptomatic. Weight is up a couple of pounds. He'll monitor for symptoms. Continue to follow with cardiology.  Obstructive sleep apnea Patient has been noncompliant with his CPAP. I discussed the importance of being on this treatment. The patient knows how to clean this and states he will start back on it. Discussed having somebody come out to go over it with him though he declined this.  GERD (gastroesophageal reflux disease) No recurrence.  Chills Single episode of chills followed by temperature of 100F next day. He's had no other symptoms. He has felt well the last 2 days. Minimal faint crackles bibasilar bases which could correspond to the areas of scarring and atelectasis seen on prior x-ray. Discussed repeat chest x-ray though given the fact that he has not had symptoms or recurrent chills or temperature the patient and I decided to defer. He will monitor and if he has recurrent symptoms or develops new symptoms he will contact us immediately.   James Rumps, MD [place]

## 2016-12-12 NOTE — Assessment & Plan Note (Signed)
Patient has been noncompliant with his CPAP. I discussed the importance of being on this treatment. The patient knows how to clean this and states he will start back on it. Discussed having somebody come out to go over it with him though he declined this.

## 2016-12-12 NOTE — Assessment & Plan Note (Signed)
Single episode of chills followed by temperature of 100F next day. He's had no other symptoms. He has felt well the last 2 days. Minimal faint crackles bibasilar bases which could correspond to the areas of scarring and atelectasis seen on prior x-ray. Discussed repeat chest x-ray though given the fact that he has not had symptoms or recurrent chills or temperature the patient and I decided to defer. He will monitor and if he has recurrent symptoms or develops new symptoms he will contact us immediately.

## 2016-12-12 NOTE — Patient Instructions (Signed)
Nice to see you. Please monitor for recurrence of any sick symptoms. If you develop chills, recurrent fever, nausea, vomiting, cough, trouble breathing, congestion, or any new or changing symptoms please let us know immediately. Please start using your CPAP again.

## 2016-12-22 DIAGNOSIS — Z23 Encounter for immunization: Secondary | ICD-10-CM | POA: Diagnosis not present

## 2016-12-22 DIAGNOSIS — G4733 Obstructive sleep apnea (adult) (pediatric): Secondary | ICD-10-CM | POA: Diagnosis not present

## 2017-02-08 ENCOUNTER — Telehealth: Payer: Self-pay | Admitting: Gastroenterology

## 2017-02-08 ENCOUNTER — Other Ambulatory Visit: Payer: Self-pay

## 2017-02-08 DIAGNOSIS — Z86010 Personal history of colonic polyps: Secondary | ICD-10-CM

## 2017-02-08 NOTE — Telephone Encounter (Signed)
Patient received a letter to schedule his colonoscopy. Please call his wife Danette at [phone] as patient doesn't communicate well.

## 2017-02-08 NOTE — Progress Notes (Signed)
Scheduled patient for repeat colonoscopy per Dr. Georgeann Oppenheim request with patient's wife Danette.   Sent prep packet thru [place].

## 2017-03-07 ENCOUNTER — Telehealth: Payer: Self-pay | Admitting: Gastroenterology

## 2017-03-07 NOTE — Telephone Encounter (Signed)
Please call patient to reschedule procedure. Today [phone] Tomorrow [phone]

## 2017-03-08 ENCOUNTER — Telehealth: Payer: Self-pay | Admitting: Gastroenterology

## 2017-03-08 MED ORDER — PEG 3350-KCL-NA BICARB-NACL 420 G PO SOLR: 4000.0000 mL | Freq: Once | ORAL | 0 refills | Status: AC

## 2017-03-08 NOTE — Telephone Encounter (Signed)
Returned patient's call to reschedule procedure.  Moved to 1/8. Sent Rx to pharmacy.

## 2017-03-08 NOTE — Telephone Encounter (Signed)
Please call wife to r/s procedure set for Friday 03/10/17.

## 2017-03-09 ENCOUNTER — Other Ambulatory Visit: Payer: Self-pay

## 2017-03-14 ENCOUNTER — Other Ambulatory Visit: Payer: Self-pay

## 2017-03-14 DIAGNOSIS — Z86010 Personal history of colonic polyps: Secondary | ICD-10-CM

## 2017-03-14 MED ORDER — PEG 3350-KCL-NA BICARB-NACL 420 G PO SOLR: 4000.0000 mL | Freq: Once | ORAL | 0 refills | Status: AC

## 2017-03-16 DIAGNOSIS — I471 Supraventricular tachycardia: Secondary | ICD-10-CM | POA: Diagnosis not present

## 2017-03-16 DIAGNOSIS — I4891 Unspecified atrial fibrillation: Secondary | ICD-10-CM | POA: Diagnosis not present

## 2017-03-16 DIAGNOSIS — Z4502 Encounter for adjustment and management of automatic implantable cardiac defibrillator: Secondary | ICD-10-CM | POA: Diagnosis not present

## 2017-03-16 DIAGNOSIS — Z95810 Presence of automatic (implantable) cardiac defibrillator: Secondary | ICD-10-CM | POA: Diagnosis not present

## 2017-03-16 DIAGNOSIS — I420 Dilated cardiomyopathy: Secondary | ICD-10-CM | POA: Diagnosis not present

## 2017-03-17 ENCOUNTER — Telehealth: Payer: Self-pay | Admitting: Gastroenterology

## 2017-03-17 NOTE — Telephone Encounter (Signed)
Please call James [name] as she hasn't received [name]'s prepping instructions or Rx for bowel prep. Please call asap as she has called several times.

## 2017-03-20 ENCOUNTER — Other Ambulatory Visit: Payer: Self-pay

## 2017-03-20 MED ORDER — NA SULFATE-K SULFATE-MG SULF 17.5-3.13-1.6 GM/177ML PO SOLN: 1.0000 | Freq: Once | ORAL | 0 refills | Status: AC

## 2017-03-20 NOTE — Telephone Encounter (Signed)
Bowel Prep Suprep has been sent to pharmacy.  Instructions have been mailed also.  Thanks [employer]

## 2017-03-27 ENCOUNTER — Emergency Department: Payer: Medicare HMO

## 2017-03-27 ENCOUNTER — Encounter: Payer: Self-pay | Admitting: Emergency Medicine

## 2017-03-27 ENCOUNTER — Ambulatory Visit: Payer: Self-pay | Admitting: *Deleted

## 2017-03-27 ENCOUNTER — Emergency Department
Admission: EM | Admit: 2017-03-27 | Discharge: 2017-03-27 | Disposition: A | Discharge status: Home / Self Care | Payer: Medicare HMO | Attending: Emergency Medicine | Admitting: Emergency Medicine

## 2017-03-27 ENCOUNTER — Other Ambulatory Visit: Payer: Self-pay

## 2017-03-27 DIAGNOSIS — R05 Cough: Secondary | ICD-10-CM | POA: Diagnosis not present

## 2017-03-27 DIAGNOSIS — J40 Bronchitis, not specified as acute or chronic: Secondary | ICD-10-CM | POA: Diagnosis not present

## 2017-03-27 DIAGNOSIS — Z79899 Other long term (current) drug therapy: Secondary | ICD-10-CM | POA: Insufficient documentation

## 2017-03-27 DIAGNOSIS — Z95810 Presence of automatic (implantable) cardiac defibrillator: Secondary | ICD-10-CM | POA: Diagnosis not present

## 2017-03-27 MED ORDER — BENZONATATE 100 MG PO CAPS: 100.0000 mg | ORAL_CAPSULE | Freq: Three times a day (TID) | ORAL | 0 refills | Status: DC | PRN

## 2017-03-27 MED ORDER — ALBUTEROL SULFATE HFA 108 (90 BASE) MCG/ACT IN AERS: 2.0000 | INHALATION_SPRAY | Freq: Four times a day (QID) | RESPIRATORY_TRACT | 0 refills | Status: DC | PRN

## 2017-03-27 MED ORDER — AZITHROMYCIN 250 MG PO TABS: ORAL_TABLET | ORAL | 0 refills | Status: DC

## 2017-03-27 NOTE — Telephone Encounter (Signed)
Noted. Please follow up with the patient to ensure he gets evaluated. Thanks.

## 2017-03-27 NOTE — Telephone Encounter (Signed)
Spoke with patients wife. She is taking patient to the ED this afternoon

## 2017-03-27 NOTE — Telephone Encounter (Signed)
Wife called in with pt in the background concerned about husband developing pneumonia.   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 Coughing up sputum over the last week.  Running a low grade fever, lethargic,.  O2 sats runing in the high 80's and low 90's.   He normally runs 95%.    Using Mucinex. I referred him to the [facility] ED due to his history and symptoms.    Wife agreed to take him now to be evaluated. I routed a note to Dr. Ellen Henri nurse pool making them aware of this. Reason for Disposition . [1] Fever > 100.0 F (37.8 C) AND [2] bedridden (e.g., nursing home patient, CVA, chronic illness, recovering from surgery)  Answer Assessment - Initial Assessment Questions 1. ONSET: "When did the cough begin?"      Over the past week 2. SEVERITY: "How bad is the cough today?"      Coughing up a lot clear sputum 3. RESPIRATORY DISTRESS: "Describe your breathing."      Low grade fever.   Lethargic.   Sats in upper 80's and low 90's. 4. FEVER: "Do you have a fever?" If so, ask: "What is your temperature, how was it measured, and when did it start?"     Low grade 100.8 on Saturday 5. SPUTUM: "Describe the color of your sputum" (clear, white, yellow, green)     Clear sputum   A lot of it. 6. HEMOPTYSIS: "Are you coughing up any blood?" If so ask: "How much?" (flecks, streaks, tablespoons, etc.)     Little green but no blood.   Just a lot of coughing 7. CARDIAC HISTORY: "Do you have any history of heart disease?" (e.g., heart attack, congestive heart failure)      Has  Muscular dystrophy.     Has a ACID and pacer maker 8. LUNG HISTORY: "Do you have any history of lung disease?"  (e.g., pulmonary embolus, asthma, emphysema)     None of the above 9. PE RISK FACTORS: "Do you have a history of blood clots?" (or: recent major surgery, recent prolonged travel, bedridden )     Muscle dystropy  Very lethargic 10. OTHER SYMPTOMS: "Do you have any other  symptoms?" (e.g., runny nose, wheezing, chest pain)       Sore throat is what started it all. 11. PREGNANCY: "Is there any chance you are pregnant?" "When was your last menstrual period?"       N/A 12. TRAVEL: "Have you traveled out of the country in the last month?" (e.g., travel history, exposures)       Wife was sick 2 months ago with a sinus infection.   Treated with antibiotics.  Protocols used: COUGH - ACUTE PRODUCTIVE-A-AH

## 2017-03-27 NOTE — ED Triage Notes (Signed)
Cough, congestion for more than a week.  Low grade  Fever at home.

## 2017-03-27 NOTE — Telephone Encounter (Signed)
FYI

## 2017-03-27 NOTE — ED Notes (Signed)
Pt reports cough for about a week although worse x3 days. Pt reports coughing up "some clear mucous" with cough occasionally.

## 2017-03-27 NOTE — ED Provider Notes (Signed)
[facility] Emergency Department Provider Note  ____________________________________________  Time seen: Approximately 6:23 PM  I have reviewed the triage vital signs and the nursing notes.   HISTORY  Chief Complaint Cough    HPI James [name] is a 51 y.o. male that presents to the emergency department for the evaluation of worsening cough for 1 week.  Patient is coughing up clear mucus.   Temperature has been as high as 100.  He does not smoke.  He has had pneumonia in the past.  He has a appointment with his primary care provider on Friday. He has a nebulizer machine at home but not an inhaler. No chills, fatigue, muscle aches, shortness of breath, chest pain, nausea, vomiting, abdominal pain.   Past Medical History:  Diagnosis Date  . Bronchitis   . Cardiomyopathy ([place])   . Dysphagia   . GERD (gastroesophageal reflux disease)   . History of chicken pox   . History of pneumonia 2013   double PNA at [facility]  . LBBB (left bundle branch block)   . Myotonic dystrophy, type 1 ([place])    followed by [facility]  . Pneumonia    aspiration  . Sleep apnea     Patient Active Problem List   Diagnosis Date Noted  . Chills 12/12/2016  . ICD (implantable cardioverter-defibrillator) in place 09/23/2016  . Abnormal EKG 09/10/2016  . Eustachian tube dysfunction, left 08/29/2016  . Chest pain 12/01/2015  . Elevated glucose 12/01/2015  . Heart palpitations 09/25/2015  . Obstructive sleep apnea 06/26/2015  . Rotator cuff tendinitis 05/22/2015  . Dysphagia   . 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 Male hypogonadism 06/01/2013  . Cardiomyopathy ([place])   . Left bundle branch block   . Myotonic dystrophy, type 1 ([place])     Past Surgical History:  Procedure Laterality Date  . CARDIAC DEFIBRILLATOR PLACEMENT     defib/pacer  . COLONOSCOPY WITH PROPOFOL N/A 08/16/2016   Procedure: COLONOSCOPY WITH PROPOFOL;  Surgeon: Jonathon Bellows, MD;  Location: [facility] ENDOSCOPY;  Service: Gastroenterology;  Laterality: N/A;  . US ECHOCARDIOGRAPHY  09/2010   EF 50%, mld LVH and dysfunction, mild RV dysfunction and enlarged    Prior to Admission medications   Medication Sig Start Date End Date Taking? Authorizing Provider  acetaminophen (TYLENOL) 325 MG tablet Take 650 mg by mouth every 6 (six) hours as needed.    [provider]  albuterol (PROVENTIL HFA;VENTOLIN HFA) 108 (90 Base) MCG/ACT inhaler Inhale 2 puffs into the lungs every 6 (six) hours as needed for wheezing or shortness of breath. 03/27/17   Laban Emperor, PA-C  azithromycin (ZITHROMAX Z-PAK) 250 MG tablet Take 2 tablets (500 mg) on  Day 1,  followed by 1 tablet (250 mg) once daily on Days 2 through 5. 03/27/17   Laban Emperor, PA-C  benzonatate (TESSALON PERLES) 100 MG capsule Take 1 capsule (100 mg total) by mouth 3 (three) times daily as needed for cough. 03/27/17 03/27/18  Laban Emperor, PA-C  docusate sodium (COLACE) 50 MG capsule Take 50 mg by mouth as needed.     [provider]  loratadine (CLARITIN) 10 MG tablet Take 1 tablet (10 mg total) by mouth daily. 04/13/15   Leone Haven, MD  Multiple Vitamin (MULTIVITAMIN) tablet Take 1 tablet by mouth daily.    [provider]  ondansetron (ZOFRAN ODT) 4 MG disintegrating tablet Allow 1-2 tablets to  dissolve in your mouth every 8 hours as needed for nausea/vomiting 10/20/15   Hinda Kehr, MD    Allergies Codeine  Family History  Problem Relation Age of Onset  . Hypertension Father   . Cancer Paternal Grandfather        lung, smoker  . Cancer Paternal Uncle        bone  . Diabetes Mother   . CAD Mother 23       MI  . Stroke Neg Hx     Social History Social History   Tobacco Use  . Smoking status: Never Smoker  . Smokeless tobacco: Never Used  Substance Use Topics  . Alcohol use: No  . Drug use: No     Review  of Systems  Constitutional: No chills Cardiovascular: No chest pain. Respiratory: Positive for cough. No SOB. Gastrointestinal: No abdominal pain.  No nausea, no vomiting.  Musculoskeletal: Negative for musculoskeletal pain. Skin: Negative for rash, abrasions, lacerations, ecchymosis.   ____________________________________________   PHYSICAL EXAM:  VITAL SIGNS: ED Triage Vitals  Enc Vitals Group     BP 03/27/17 1658 113/79     Pulse Rate 03/27/17 1658 85     Resp 03/27/17 1658 16     Temp 03/27/17 1658 98.5 F (36.9 C)     Temp Source 03/27/17 1658 Oral     SpO2 03/27/17 1658 94 %     Weight 03/27/17 1659 198 lb (89.8 kg)     Height 03/27/17 1659 5\' 11"  (1.803 m)     Head Circumference --      Peak Flow --      Pain Score --      Pain Loc --      Pain Edu? --      Excl. in [place]? --      Constitutional: Alert and oriented. Well appearing and in no acute distress. Eyes: Conjunctivae are normal. PERRL. EOMI. Head: Atraumatic. ENT:      Ears: Tympanic membranes are pearly      Nose: No congestion/rhinnorhea.      Mouth/Throat: Mucous membranes are moist. Oropharynx non erythematous. Tonsils not enlarged. Neck: No stridor.   Cardiovascular: Normal rate, regular rhythm.  Good peripheral circulation. Respiratory: Normal respiratory effort without tachypnea or retractions. Lungs CTAB. Good air entry to the bases with no decreased or absent breath sounds. Gastrointestinal: Bowel sounds 4 quadrants. Soft and nontender to palpation. No guarding or rigidity. No palpable masses. No distention.  Musculoskeletal: Full range of motion to all extremities. No gross deformities appreciated. Neurologic:  Normal speech and language. No gross focal neurologic deficits are appreciated.  Skin:  Skin is warm, dry and intact. No rash noted.   ____________________________________________   LABS (all labs ordered are listed, but only abnormal results are displayed)  Labs Reviewed - No data  to display ____________________________________________  EKG   ____________________________________________  RADIOLOGY Robinette Haines, personally viewed and evaluated these images (plain radiographs) as part of my medical decision making, as well as reviewing the written report by the radiologist.  Dg Chest 2 View  Result Date: 03/27/2017 CLINICAL DATA:  Pt reports cough for about a week although worse x 3 days. Pt reports coughing up "some clear mucous" with cough occasionally. Hx - cardiomyopathy, multiple bouts of PNA most recent was approx 1 yr ago, LBBB, myotonic dystrophy, defibrillator, non-smoker EXAM: CHEST  2 VIEW COMPARISON:  03/29/2016 FINDINGS: Pacer/AICD device. Midline trachea. Borderline cardiomegaly. Mediastinal contours otherwise within normal limits. No pleural effusion  or pneumothorax. Mild bibasilar volume loss with minimal scarring or subsegmental atelectasis at the lung bases, similar. IMPRESSION: No acute cardiopulmonary disease. Electronically Signed   By: Abigail Miyamoto M.D.   On: 03/27/2017 18:01    ____________________________________________    PROCEDURES  Procedure(s) performed:    Procedures    Medications - No data to display   ____________________________________________   INITIAL IMPRESSION / ASSESSMENT AND PLAN / ED COURSE  Pertinent labs & imaging results that were available during my care of the patient were reviewed by me and considered in my medical decision making (see chart for details).  Review of the [place] CSRS was performed in accordance of the [place] prior to dispensing any controlled drugs.   Patient's diagnosis is consistent with bronchitis.  Vital signs and exam are reassuring.  Chest x-ray negative for acute processes. Symptoms have been present for over seven days so he will be covered for bacterial causes. Patient will be discharged home with prescriptions for azithromycin, albuterol inhaler, Tessalon Perles. Patient is to  follow up with PCP as directed. Patient is given ED precautions to return to the ED for any worsening or new symptoms.     ____________________________________________  FINAL CLINICAL IMPRESSION(S) / ED DIAGNOSES  Final diagnoses:  Bronchitis      NEW MEDICATIONS STARTED DURING THIS VISIT:  ED Discharge Orders        Ordered    azithromycin (ZITHROMAX Z-PAK) 250 MG tablet     03/27/17 1827    benzonatate (TESSALON PERLES) 100 MG capsule  3 times daily PRN     03/27/17 1827    albuterol (PROVENTIL HFA;VENTOLIN HFA) 108 (90 Base) MCG/ACT inhaler  Every 6 hours PRN     03/27/17 1827          This chart was dictated using voice recognition software/Dragon. Despite best efforts to proofread, errors can occur which can change the meaning. Any change was purely unintentional.    Laban Emperor, PA-C 03/27/17 2107    Darel Hong, MD 03/27/17 2216

## 2017-03-31 ENCOUNTER — Ambulatory Visit (INDEPENDENT_AMBULATORY_CARE_PROVIDER_SITE_OTHER): Payer: Medicare HMO | Admitting: Family Medicine

## 2017-03-31 ENCOUNTER — Encounter: Payer: Self-pay | Admitting: Family Medicine

## 2017-03-31 VITALS — BP 100/66 | HR 90 | Temp 98.3°F | Wt 204.2 lb

## 2017-03-31 DIAGNOSIS — R195 Other fecal abnormalities: Secondary | ICD-10-CM | POA: Insufficient documentation

## 2017-03-31 DIAGNOSIS — I429 Cardiomyopathy, unspecified: Secondary | ICD-10-CM | POA: Diagnosis not present

## 2017-03-31 DIAGNOSIS — G4733 Obstructive sleep apnea (adult) (pediatric): Secondary | ICD-10-CM

## 2017-03-31 DIAGNOSIS — R7309 Other abnormal glucose: Secondary | ICD-10-CM | POA: Diagnosis not present

## 2017-03-31 DIAGNOSIS — J40 Bronchitis, not specified as acute or chronic: Secondary | ICD-10-CM | POA: Diagnosis not present

## 2017-03-31 HISTORY — DX: Other fecal abnormalities: R19.5

## 2017-03-31 LAB — POCT GLYCOSYLATED HEMOGLOBIN (HGB A1C): HEMOGLOBIN A1C: 5.7

## 2017-03-31 MED ORDER — ALBUTEROL SULFATE (2.5 MG/3ML) 0.083% IN NEBU: 2.5000 mg | INHALATION_SOLUTION | Freq: Four times a day (QID) | RESPIRATORY_TRACT | 1 refills | Status: DC | PRN

## 2017-03-31 NOTE — Patient Instructions (Signed)
Nice to see you. Please finish your course of azithromycin. You need to try to use her CPAP. We will contact you with your A1c result.

## 2017-03-31 NOTE — Progress Notes (Signed)
James Rumps, MD Phone: [phone]  James [name] is a 52 y.o. male who presents today for follow-up.  Patient was recently seen in the emergency room and diagnosed with bronchitis.  Chest x-ray with no focal findings.  Some scarring at the bases of his lungs.  He was coughing with clear mucus.  T-max of 100 F.  Notes that the azithromycin has been helpful.  His cough has improved significantly.No further fevers.  No shortness of breath.  Needs a refill on his nebulizer medication.  OSA: He is not using his CPAP.  He does report he sleeps fairly well.  Has been having trouble tolerating the mask.  Patient does have cardiomyopathy and has an ICD and pacemaker in place.  He follows with cardiology.  Notes he sees him soon.  No chest pain or ICD discharges.  No orthopnea.  No shortness of breath.  Patient is due to have a colonoscopy for the positive cologuard.  This is scheduled for next week.  Social History   Tobacco Use  Smoking Status Never Smoker  Smokeless Tobacco Never Used     ROS see history of present illness  Objective  Physical Exam Vitals:   03/31/17 1503  BP: 100/66  Pulse: 90  Temp: 98.3 F (36.8 C)  SpO2: 94%    BP Readings from Last 3 Encounters:  03/31/17 100/66  03/27/17 113/79  12/12/16 128/80   Wt Readings from Last 3 Encounters:  03/31/17 204 lb 3.2 oz (92.6 kg)  03/27/17 198 lb (89.8 kg)  12/12/16 201 lb 9.6 oz (91.4 kg)    Physical Exam  Constitutional: No distress.  Cardiovascular: Normal rate, regular rhythm and normal heart sounds.  Pulmonary/Chest: Effort normal. No respiratory distress. He has no wheezes. He has no rales.  Minimal bibasilar crackles  Musculoskeletal: He exhibits no edema.  Neurological: He is alert. Gait normal.  Skin: Skin is warm and dry. He is not diaphoretic.     Assessment/Plan: Please see individual problem list.  Elevated glucose Check A1c.  Cardiomyopathy [facility]) Doing well.  ICD in place.  He  will see cardiology as planned.  Obstructive sleep apnea Patient has been noncompliant with his CPAP.  I discussed that he needs to try to use this as not using it places stress on his heart.  He reports he will try to use this.  I did offer to have somebody come and help him with that and see if there was a more appropriate mask for him to use.  Bronchitis Symptoms have improved.  Minimal crackles in bilateral bases correspond with scarring seen on x-ray.  Will monitor his symptoms.  If they worsen he will be reevaluated.  Positive colorectal cancer screening using Cologuard test Colonoscopy scheduled for next week.   James [name] was seen today for follow-up.  Diagnoses and all orders for this visit:  Elevated glucose -     POCT HgB A1C  Cardiomyopathy, unspecified type (HCC)  Obstructive sleep apnea  Bronchitis  Positive colorectal cancer screening using Cologuard test  Other orders -     albuterol (PROVENTIL) (2.5 MG/3ML) 0.083% nebulizer solution; Take 3 mLs (2.5 mg total) by nebulization every 6 (six) hours as needed for wheezing or shortness of breath.    Orders Placed This Encounter  Procedures  . POCT HgB A1C    Meds ordered this encounter  Medications  . albuterol (PROVENTIL) (2.5 MG/3ML) 0.083% nebulizer solution    Sig: Take 3 mLs (2.5 mg total) by nebulization every  6 (six) hours as needed for wheezing or shortness of breath.    Dispense:  150 mL    Refill:  [place], MD [place]

## 2017-03-31 NOTE — Assessment & Plan Note (Signed)
Doing well.  ICD in place.  He will see cardiology as planned.

## 2017-03-31 NOTE — Assessment & Plan Note (Signed)
Patient has been noncompliant with his CPAP.  I discussed that he needs to try to use this as not using it places stress on his heart.  He reports he will try to use this.  I did offer to have somebody come and help him with that and see if there was a more appropriate mask for him to use.

## 2017-03-31 NOTE — Assessment & Plan Note (Signed)
Symptoms have improved.  Minimal crackles in bilateral bases correspond with scarring seen on x-ray.  Will monitor his symptoms.  If they worsen he will be reevaluated.

## 2017-03-31 NOTE — Assessment & Plan Note (Signed)
Check A1c. 

## 2017-03-31 NOTE — Assessment & Plan Note (Signed)
Colonoscopy scheduled for next week

## 2017-04-02 ENCOUNTER — Encounter: Payer: Self-pay | Admitting: Family Medicine

## 2017-04-03 ENCOUNTER — Encounter: Payer: Self-pay | Admitting: *Deleted

## 2017-04-04 ENCOUNTER — Ambulatory Visit
Admission: RE | Admit: 2017-04-04 | Discharge: 2017-04-04 | Disposition: A | Discharge status: Home / Self Care | Payer: Medicare HMO | Source: Ambulatory Visit | Attending: Gastroenterology | Admitting: Gastroenterology

## 2017-04-04 ENCOUNTER — Encounter
Admission: RE | Disposition: A | Discharge status: Home / Self Care | Payer: Self-pay | Source: Ambulatory Visit | Attending: Gastroenterology

## 2017-04-04 ENCOUNTER — Ambulatory Visit: Payer: Medicare HMO | Admitting: Anesthesiology

## 2017-04-04 ENCOUNTER — Encounter: Payer: Self-pay | Admitting: Emergency Medicine

## 2017-04-04 DIAGNOSIS — K5790 Diverticulosis of intestine, part unspecified, without perforation or abscess without bleeding: Secondary | ICD-10-CM | POA: Insufficient documentation

## 2017-04-04 DIAGNOSIS — G4730 Sleep apnea, unspecified: Secondary | ICD-10-CM | POA: Insufficient documentation

## 2017-04-04 DIAGNOSIS — Z95810 Presence of automatic (implantable) cardiac defibrillator: Secondary | ICD-10-CM | POA: Insufficient documentation

## 2017-04-04 DIAGNOSIS — Z1211 Encounter for screening for malignant neoplasm of colon: Secondary | ICD-10-CM | POA: Insufficient documentation

## 2017-04-04 DIAGNOSIS — Z86010 Personal history of colonic polyps: Secondary | ICD-10-CM | POA: Diagnosis not present

## 2017-04-04 DIAGNOSIS — K219 Gastro-esophageal reflux disease without esophagitis: Secondary | ICD-10-CM | POA: Diagnosis not present

## 2017-04-04 DIAGNOSIS — D125 Benign neoplasm of sigmoid colon: Secondary | ICD-10-CM | POA: Diagnosis not present

## 2017-04-04 DIAGNOSIS — Z79899 Other long term (current) drug therapy: Secondary | ICD-10-CM | POA: Diagnosis not present

## 2017-04-04 DIAGNOSIS — G7111 Myotonic muscular dystrophy: Secondary | ICD-10-CM | POA: Diagnosis not present

## 2017-04-04 DIAGNOSIS — D123 Benign neoplasm of transverse colon: Secondary | ICD-10-CM | POA: Diagnosis not present

## 2017-04-04 DIAGNOSIS — I429 Cardiomyopathy, unspecified: Secondary | ICD-10-CM | POA: Diagnosis not present

## 2017-04-04 DIAGNOSIS — Z8249 Family history of ischemic heart disease and other diseases of the circulatory system: Secondary | ICD-10-CM | POA: Insufficient documentation

## 2017-04-04 DIAGNOSIS — J449 Chronic obstructive pulmonary disease, unspecified: Secondary | ICD-10-CM | POA: Diagnosis not present

## 2017-04-04 DIAGNOSIS — K5730 Diverticulosis of large intestine without perforation or abscess without bleeding: Secondary | ICD-10-CM | POA: Diagnosis not present

## 2017-04-04 DIAGNOSIS — K635 Polyp of colon: Secondary | ICD-10-CM | POA: Insufficient documentation

## 2017-04-04 HISTORY — PX: COLONOSCOPY WITH PROPOFOL: SHX5780

## 2017-04-04 SURGERY — COLONOSCOPY WITH PROPOFOL
Anesthesia: General

## 2017-04-04 MED ORDER — PROPOFOL 500 MG/50ML IV EMUL
INTRAVENOUS | Status: DC | PRN
  Administered 2017-04-04: 120 ug/kg/min via INTRAVENOUS

## 2017-04-04 MED ORDER — LIDOCAINE 2% (20 MG/ML) 5 ML SYRINGE
INTRAMUSCULAR | Status: DC | PRN
  Administered 2017-04-04: 25 mg via INTRAVENOUS

## 2017-04-04 MED ORDER — SODIUM CHLORIDE 0.9 % IV SOLN
INTRAVENOUS | Status: DC
  Administered 2017-04-04: 1000 mL via INTRAVENOUS

## 2017-04-04 MED ORDER — PROPOFOL 10 MG/ML IV BOLUS
INTRAVENOUS | Status: DC | PRN
  Administered 2017-04-04: 70 mg via INTRAVENOUS
  Administered 2017-04-04: 30 mg via INTRAVENOUS

## 2017-04-04 MED ORDER — PROPOFOL 500 MG/50ML IV EMUL
INTRAVENOUS | Status: AC
  Filled 2017-04-04: qty 50

## 2017-04-04 NOTE — Anesthesia Post-op Follow-up Note (Signed)
Anesthesia QCDR form completed.        

## 2017-04-04 NOTE — Transfer of Care (Signed)
Immediate Anesthesia Transfer of Care Note  Patient: James [name]  Procedure(s) Performed: COLONOSCOPY WITH PROPOFOL (N/A )  Patient Location: Endoscopy Unit  Anesthesia Type:General  Level of Consciousness: awake  Airway & Oxygen Therapy: Patient Spontanous Breathing and Patient connected to nasal cannula oxygen  Post-op Assessment: Report given to RN and Post -op Vital signs reviewed and stable  Post vital signs: Reviewed  Last Vitals:  Vitals:   04/04/17 0851 04/04/17 0939  BP: 122/78 (!) 93/59  Pulse: 82 91  Resp: 16 (!) 21  Temp: 36.6 C (!) 36.1 C  SpO2: 94% 93%    Last Pain:  Vitals:   04/04/17 0851  TempSrc: Oral         Complications: No apparent anesthesia complications

## 2017-04-04 NOTE — Anesthesia Postprocedure Evaluation (Signed)
Anesthesia Post Note  Patient: James [name]  Procedure(s) Performed: COLONOSCOPY WITH PROPOFOL (N/A )  Patient location during evaluation: Endoscopy Anesthesia Type: General Level of consciousness: awake and alert Pain management: pain level controlled Vital Signs Assessment: post-procedure vital signs reviewed and stable Respiratory status: spontaneous breathing and respiratory function stable Cardiovascular status: stable Anesthetic complications: no     Last Vitals:  Vitals:   04/04/17 0939 04/04/17 0945  BP: (!) 93/59 (!) 87/66  Pulse: 91 83  Resp: (!) 21 17  Temp: 37 C   SpO2: 93% 93%    Last Pain:  Vitals:   04/04/17 0945  TempSrc:   PainSc: 0-No pain                 Elpidia Karn K

## 2017-04-04 NOTE — Anesthesia Preprocedure Evaluation (Signed)
Anesthesia Evaluation  Patient identified by MRN, date of birth, ID band Patient awake    Reviewed: Allergy & Precautions, NPO status , Patient's Chart, lab work & pertinent test results  History of Anesthesia Complications Negative for: history of anesthetic complications  Airway Mallampati: II       Dental   Pulmonary sleep apnea and Continuous Positive Airway Pressure Ventilation , COPD,  COPD inhaler,           Cardiovascular (-) hypertension(-) Past MI and (-) CHF + dysrhythmias (tachy/brady) + pacemaker (-) Valvular Problems/Murmurs     Neuro/Psych neg Seizures  Neuromuscular disease (myotonic dystrophy)    GI/Hepatic Neg liver ROS, GERD  Medicated and Controlled,  Endo/Other  neg diabetes  Renal/GU negative Renal ROS     Musculoskeletal   Abdominal   Peds  Hematology   Anesthesia Other Findings   Reproductive/Obstetrics                             Anesthesia Physical Anesthesia Plan  ASA: III  Anesthesia Plan: General   Post-op Pain Management:    Induction: Intravenous  PONV Risk Score and Plan:   Airway Management Planned: Nasal Cannula  Additional Equipment:   Intra-op Plan:   Post-operative Plan:   Informed Consent: I have reviewed the patients History and Physical, chart, labs and discussed the procedure including the risks, benefits and alternatives for the proposed anesthesia with the patient or authorized representative who has indicated his/her understanding and acceptance.     Plan Discussed with:   Anesthesia Plan Comments:         Anesthesia Quick Evaluation

## 2017-04-04 NOTE — Op Note (Signed)
[facility] Gastroenterology Patient Name: James [name] Procedure Date: 04/04/2017 9:07 AM MRN: [id number] Account #: [id number] Date of Birth: 05-Mar-1966 Admit Type: Outpatient Age: 52 Room: [facility] ENDO ROOM 1 Gender: Male Note Status: Finalized Procedure:            Colonoscopy Indications:          High risk colon cancer surveillance: Personal history                        of colonic polyps Providers:            Jonathon Bellows MD, MD Referring MD:         Angela Adam. Caryl Bis (Referring MD) Medicines:            Monitored Anesthesia Care Complications:        No immediate complications. Procedure:            Pre-Anesthesia Assessment:                       - Prior to the procedure, a History and Physical was                        performed, and patient medications, allergies and                        sensitivities were reviewed. The patient's tolerance of                        previous anesthesia was reviewed.                       - The risks and benefits of the procedure and the                        sedation options and risks were discussed with the                        patient. All questions were answered and informed                        consent was obtained.                       - ASA Grade Assessment: III - A patient with severe                        systemic disease.                       After obtaining informed consent, the colonoscope was                        passed under direct vision. Throughout the procedure,                        the patient's blood pressure, pulse, and oxygen                        saturations were monitored continuously. The                        Colonoscope  was introduced through the anus and                        advanced to the the cecum, identified by the                        appendiceal orifice, IC valve and transillumination.                        The colonoscopy was performed with ease. The patient               tolerated the procedure well. The quality of the bowel                        preparation was good. Findings:      The perianal and digital rectal examinations were normal.      A 3 mm polyp was found in the transverse colon. The polyp was sessile.       The polyp was removed with a cold biopsy forceps. Resection and       retrieval were complete.      Two sessile polyps were found in the sigmoid colon. The polyps were 3 to       5 mm in size. one polyp was seen at the prior polypectomy site- may have       been hyperplastic tissue from the prior polypectomy. Second polyp was 1       cm distal to the prior polyp . These polyps were removed with a cold       biopsy forceps. Resection and retrieval were complete.      The exam was otherwise without abnormality on direct and retroflexion       views. Impression:           - One 3 mm polyp in the transverse colon, removed with                        a cold biopsy forceps. Resected and retrieved.                       - Two 3 to 5 mm polyps in the sigmoid colon, removed                        with a cold biopsy forceps. Resected and retrieved.                       - The examination was otherwise normal on direct and                        retroflexion views. Recommendation:       - Discharge patient to home (with escort).                       - Resume previous diet.                       - Continue present medications.                       - Await pathology results.                       -  Repeat colonoscopy in 3 years for surveillance. Procedure Code(s):    --- Professional ---                       [phone], Colonoscopy, flexible; with biopsy, single or                        multiple Diagnosis Code(s):    --- Professional ---                       Z86.010, Personal history of colonic polyps                       D12.3, Benign neoplasm of transverse colon (hepatic                        flexure or splenic flexure)                        D12.5, Benign neoplasm of sigmoid colon CPT copyright 2016 American Medical Association. All rights reserved. The codes documented in this report are preliminary and upon coder review may  be revised to meet current compliance requirements. Jonathon Bellows, MD Jonathon Bellows MD, MD 04/04/2017 9:34:48 AM This report has been signed electronically. Number of Addenda: 0 Note Initiated On: 04/04/2017 9:07 AM Scope Withdrawal Time: 0 hours 13 minutes 12 seconds  Total Procedure Duration: 0 hours 20 minutes 38 seconds       [facility]

## 2017-04-04 NOTE — H&P (Signed)
James Bellows, MD [address] [place], [place], [place], [address] [address], [place], [place], [place], [address] Phone: [phone]  Fax: [phone]  Primary Care Physician:  Leone Haven, MD   Pre-Procedure History & Physical: HPI:  James [name] is a 52 y.o. male is here for an colonoscopy.   Past Medical History:  Diagnosis Date  . Bronchitis   . Cardiomyopathy ([place])   . Dysphagia   . GERD (gastroesophageal reflux disease)   . History of chicken pox   . History of pneumonia 2013   double PNA at [facility]  . LBBB (left bundle branch block)   . Myotonic dystrophy, type 1 ([place])    followed by [facility]  . Pneumonia    aspiration  . Sleep apnea     Past Surgical History:  Procedure Laterality Date  . CARDIAC DEFIBRILLATOR PLACEMENT     defib/pacer  . COLONOSCOPY WITH PROPOFOL N/A 08/16/2016   Procedure: COLONOSCOPY WITH PROPOFOL;  Surgeon: James Bellows, MD;  Location: [facility] ENDOSCOPY;  Service: Gastroenterology;  Laterality: N/A;  . US ECHOCARDIOGRAPHY  09/2010   EF 50%, mld LVH and dysfunction, mild RV dysfunction and enlarged    Prior to Admission medications   Medication Sig Start Date End Date Taking? Authorizing Provider  albuterol (PROVENTIL HFA;VENTOLIN HFA) 108 (90 Base) MCG/ACT inhaler Inhale 2 puffs into the lungs every 6 (six) hours as needed for wheezing or shortness of breath. 03/27/17  Yes Laban Emperor, PA-C  albuterol (PROVENTIL) (2.5 MG/3ML) 0.083% nebulizer solution Take 3 mLs (2.5 mg total) by nebulization every 6 (six) hours as needed for wheezing or shortness of breath. 03/31/17  Yes Leone Haven, MD  benzonatate (TESSALON PERLES) 100 MG capsule Take 1 capsule (100 mg total) by mouth 3 (three) times daily as needed for cough. 03/27/17 03/27/18 Yes Laban Emperor, PA-C  docusate sodium (COLACE) 50 MG capsule Take 50 mg by mouth as needed.    Yes [provider]  loratadine (CLARITIN) 10 MG tablet Take 1 tablet (10 mg total) by mouth  daily. 04/13/15  Yes Leone Haven, MD  Multiple Vitamin (MULTIVITAMIN) tablet Take 1 tablet by mouth daily.   Yes [provider]  ondansetron (ZOFRAN ODT) 4 MG disintegrating tablet Allow 1-2 tablets to dissolve in your mouth every 8 hours as needed for nausea/vomiting 10/20/15  Yes Hinda Kehr, MD    Allergies as of 02/08/2017 - Review Complete 12/12/2016  Allergen Reaction Noted  . Codeine Nausea Only 05/31/2013    Family History  Problem Relation Age of Onset  . Hypertension Father   . Cancer Paternal Grandfather        lung, smoker  . Cancer Paternal Uncle        bone  . Diabetes Mother   . CAD Mother 55       MI  . Stroke Neg Hx     Social History   Socioeconomic History  . Marital status: Married    Spouse name: Not on file  . Number of children: Not on file  . Years of education: Not on file  . Highest education level: Not on file  Social Needs  . Financial resource strain: Not on file  . Food insecurity - worry: Not on file  . Food insecurity - inability: Not on file  . Transportation needs - medical: Not on file  . Transportation needs - non-medical: Not on file  Occupational History  . Not on file  Tobacco Use  .  Smoking status: Never Smoker  . Smokeless tobacco: Never Used  Substance and Sexual Activity  . Alcohol use: No  . Drug use: No  . Sexual activity: Not on file  Other Topics Concern  . 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 General:   Alert,  pleasant and cooperative in NAD Head:  Normocephalic and atraumatic. Neck:  Supple; no masses or thyromegaly. Lungs:  Clear throughout to auscultation,  normal respiratory effort.    Heart:  +S1, +S2, Regular rate and rhythm, No edema. Abdomen:  Soft, nontender and nondistended. Normal bowel sounds, without guarding, and without rebound.   Neurologic:  Alert and  oriented x4;  grossly normal neurologically.  Impression/Plan: James [name] is here for an colonoscopy to be performed for surveillance due to prior history of colon polyps   Risks, benefits, limitations, and alternatives regarding  colonoscopy have been reviewed with the patient.  Questions have been answered.  All parties agreeable.   James Bellows, MD  04/04/2017, 8:43 AM

## 2017-04-05 ENCOUNTER — Encounter: Payer: Self-pay | Admitting: Gastroenterology

## 2017-04-05 LAB — SURGICAL PATHOLOGY

## 2017-04-16 ENCOUNTER — Encounter: Payer: Self-pay | Admitting: Gastroenterology

## 2017-05-01 DIAGNOSIS — R9431 Abnormal electrocardiogram [ECG] [EKG]: Secondary | ICD-10-CM | POA: Diagnosis not present

## 2017-05-01 DIAGNOSIS — G4733 Obstructive sleep apnea (adult) (pediatric): Secondary | ICD-10-CM | POA: Diagnosis not present

## 2017-05-01 DIAGNOSIS — Z95810 Presence of automatic (implantable) cardiac defibrillator: Secondary | ICD-10-CM | POA: Diagnosis not present

## 2017-05-01 DIAGNOSIS — G7111 Myotonic muscular dystrophy: Secondary | ICD-10-CM | POA: Diagnosis not present

## 2017-05-01 DIAGNOSIS — I447 Left bundle-branch block, unspecified: Secondary | ICD-10-CM | POA: Diagnosis not present

## 2017-05-01 DIAGNOSIS — K219 Gastro-esophageal reflux disease without esophagitis: Secondary | ICD-10-CM | POA: Diagnosis not present

## 2017-05-01 DIAGNOSIS — R002 Palpitations: Secondary | ICD-10-CM | POA: Diagnosis not present

## 2017-05-01 DIAGNOSIS — I420 Dilated cardiomyopathy: Secondary | ICD-10-CM | POA: Diagnosis not present

## 2017-05-22 ENCOUNTER — Ambulatory Visit: Payer: Medicare HMO

## 2017-05-23 DIAGNOSIS — G7111 Myotonic muscular dystrophy: Secondary | ICD-10-CM | POA: Diagnosis not present

## 2017-05-23 DIAGNOSIS — I420 Dilated cardiomyopathy: Secondary | ICD-10-CM | POA: Diagnosis not present

## 2017-05-23 DIAGNOSIS — Z95810 Presence of automatic (implantable) cardiac defibrillator: Secondary | ICD-10-CM | POA: Diagnosis not present

## 2017-05-23 DIAGNOSIS — I447 Left bundle-branch block, unspecified: Secondary | ICD-10-CM | POA: Diagnosis not present

## 2017-06-08 ENCOUNTER — Ambulatory Visit: Payer: Medicare HMO

## 2017-06-09 DIAGNOSIS — G7111 Myotonic muscular dystrophy: Secondary | ICD-10-CM | POA: Diagnosis not present

## 2017-06-09 DIAGNOSIS — Z9181 History of falling: Secondary | ICD-10-CM | POA: Diagnosis not present

## 2017-06-09 DIAGNOSIS — M6281 Muscle weakness (generalized): Secondary | ICD-10-CM | POA: Diagnosis not present

## 2017-06-09 DIAGNOSIS — G4733 Obstructive sleep apnea (adult) (pediatric): Secondary | ICD-10-CM | POA: Diagnosis not present

## 2017-06-22 DIAGNOSIS — I4891 Unspecified atrial fibrillation: Secondary | ICD-10-CM | POA: Diagnosis not present

## 2017-06-22 DIAGNOSIS — Z4502 Encounter for adjustment and management of automatic implantable cardiac defibrillator: Secondary | ICD-10-CM | POA: Diagnosis not present

## 2017-06-22 DIAGNOSIS — I5022 Chronic systolic (congestive) heart failure: Secondary | ICD-10-CM | POA: Diagnosis not present

## 2017-06-22 DIAGNOSIS — I471 Supraventricular tachycardia: Secondary | ICD-10-CM | POA: Diagnosis not present

## 2017-06-22 DIAGNOSIS — Z95810 Presence of automatic (implantable) cardiac defibrillator: Secondary | ICD-10-CM | POA: Diagnosis not present

## 2017-06-26 DIAGNOSIS — I420 Dilated cardiomyopathy: Secondary | ICD-10-CM | POA: Diagnosis not present

## 2017-08-23 DIAGNOSIS — R0609 Other forms of dyspnea: Secondary | ICD-10-CM | POA: Diagnosis not present

## 2017-08-23 DIAGNOSIS — G4733 Obstructive sleep apnea (adult) (pediatric): Secondary | ICD-10-CM | POA: Diagnosis not present

## 2017-08-23 DIAGNOSIS — Z95810 Presence of automatic (implantable) cardiac defibrillator: Secondary | ICD-10-CM | POA: Diagnosis not present

## 2017-08-23 DIAGNOSIS — I420 Dilated cardiomyopathy: Secondary | ICD-10-CM | POA: Diagnosis not present

## 2017-09-19 DIAGNOSIS — G4733 Obstructive sleep apnea (adult) (pediatric): Secondary | ICD-10-CM | POA: Diagnosis not present

## 2017-09-19 DIAGNOSIS — G7111 Myotonic muscular dystrophy: Secondary | ICD-10-CM | POA: Diagnosis not present

## 2017-09-29 ENCOUNTER — Ambulatory Visit: Payer: Medicare HMO | Admitting: Family Medicine

## 2017-09-29 DIAGNOSIS — I480 Paroxysmal atrial fibrillation: Secondary | ICD-10-CM | POA: Insufficient documentation

## 2017-10-06 DIAGNOSIS — G7100 Muscular dystrophy, unspecified: Secondary | ICD-10-CM | POA: Diagnosis not present

## 2017-10-06 DIAGNOSIS — J9611 Chronic respiratory failure with hypoxia: Secondary | ICD-10-CM | POA: Diagnosis not present

## 2017-10-06 DIAGNOSIS — G4733 Obstructive sleep apnea (adult) (pediatric): Secondary | ICD-10-CM | POA: Diagnosis not present

## 2017-10-06 DIAGNOSIS — J129 Viral pneumonia, unspecified: Secondary | ICD-10-CM | POA: Diagnosis not present

## 2017-10-06 DIAGNOSIS — J189 Pneumonia, unspecified organism: Secondary | ICD-10-CM | POA: Diagnosis not present

## 2017-10-23 ENCOUNTER — Emergency Department: Payer: Medicare HMO

## 2017-10-23 ENCOUNTER — Emergency Department
Admission: EM | Admit: 2017-10-23 | Discharge: 2017-10-23 | Disposition: A | Discharge status: Home / Self Care | Payer: Medicare HMO | Attending: Student in an Organized Health Care Education/Training Program | Admitting: Student in an Organized Health Care Education/Training Program

## 2017-10-23 ENCOUNTER — Encounter: Payer: Self-pay | Admitting: Emergency Medicine

## 2017-10-23 ENCOUNTER — Other Ambulatory Visit: Payer: Self-pay

## 2017-10-23 DIAGNOSIS — Y929 Unspecified place or not applicable: Secondary | ICD-10-CM | POA: Insufficient documentation

## 2017-10-23 DIAGNOSIS — M87059 Idiopathic aseptic necrosis of unspecified femur: Secondary | ICD-10-CM

## 2017-10-23 DIAGNOSIS — W108XXA Fall (on) (from) other stairs and steps, initial encounter: Secondary | ICD-10-CM | POA: Diagnosis not present

## 2017-10-23 DIAGNOSIS — M533 Sacrococcygeal disorders, not elsewhere classified: Secondary | ICD-10-CM | POA: Diagnosis not present

## 2017-10-23 DIAGNOSIS — S300XXA Contusion of lower back and pelvis, initial encounter: Secondary | ICD-10-CM | POA: Diagnosis not present

## 2017-10-23 DIAGNOSIS — M545 Low back pain: Secondary | ICD-10-CM | POA: Diagnosis not present

## 2017-10-23 DIAGNOSIS — Z79899 Other long term (current) drug therapy: Secondary | ICD-10-CM | POA: Insufficient documentation

## 2017-10-23 DIAGNOSIS — Z951 Presence of aortocoronary bypass graft: Secondary | ICD-10-CM | POA: Insufficient documentation

## 2017-10-23 DIAGNOSIS — Y999 Unspecified external cause status: Secondary | ICD-10-CM | POA: Diagnosis not present

## 2017-10-23 DIAGNOSIS — S3992XA Unspecified injury of lower back, initial encounter: Secondary | ICD-10-CM | POA: Diagnosis not present

## 2017-10-23 DIAGNOSIS — Y9301 Activity, walking, marching and hiking: Secondary | ICD-10-CM | POA: Insufficient documentation

## 2017-10-23 DIAGNOSIS — W19XXXA Unspecified fall, initial encounter: Secondary | ICD-10-CM

## 2017-10-23 DIAGNOSIS — M879 Osteonecrosis, unspecified: Secondary | ICD-10-CM | POA: Diagnosis not present

## 2017-10-23 MED ORDER — ONDANSETRON 4 MG PO TBDP: 4.0000 mg | ORAL_TABLET | Freq: Three times a day (TID) | ORAL | 0 refills | Status: DC | PRN

## 2017-10-23 MED ORDER — TRAMADOL HCL 50 MG PO TABS: 50.0000 mg | ORAL_TABLET | Freq: Four times a day (QID) | ORAL | 0 refills | Status: DC | PRN

## 2017-10-23 NOTE — ED Provider Notes (Signed)
[facility] Emergency Department Provider Note  ____________________________________________   First MD Initiated Contact with Patient 10/23/17 1053     (approximate)  I have reviewed the triage vital signs and the nursing notes.   HISTORY  Chief Complaint Fall    HPI James [name] is a 52 y.o. male presents emergency department complaining of a fall down one step where he landed on his buttocks on Thursday.  He states he has weak muscles because of the muscular dystrophy and has frequent falls.  He usually walks with a cane.  He is complaining of lower back pain.  He denies numbness or tingling due to the fall.  He denies head injury or loss of consciousness.  Past Medical History:  Diagnosis Date  . Bronchitis   . Cardiomyopathy ([place])   . Dysphagia   . GERD (gastroesophageal reflux disease)   . History of chicken pox   . History of pneumonia 2013   double PNA at [facility]  . LBBB (left bundle branch block)   . Myotonic dystrophy, type 1 ([place])    followed by [facility]  . Pneumonia    aspiration  . Sleep apnea     Patient Active Problem List   Diagnosis Date Noted  . Positive colorectal cancer screening using Cologuard test 03/31/2017  . ICD (implantable cardioverter-defibrillator) in place 09/23/2016  . Abnormal EKG 09/10/2016  . Eustachian tube dysfunction, left 08/29/2016  . Bronchitis 04/07/2016  . Prediabetes 12/01/2015  . Heart palpitations 09/25/2015  . Obstructive sleep apnea 06/26/2015  . Rotator cuff tendinitis 05/22/2015  . Dysphagia   . 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 Male hypogonadism 06/01/2013  . Cardiomyopathy ([place])   . Left bundle branch block   . Myotonic dystrophy, type 1 ([place])     Past Surgical History:  Procedure Laterality Date  . CARDIAC DEFIBRILLATOR PLACEMENT     defib/pacer  . CARDIAC DEFIBRILLATOR PLACEMENT    .  COLONOSCOPY WITH PROPOFOL N/A 08/16/2016   Procedure: COLONOSCOPY WITH PROPOFOL;  Surgeon: Jonathon Bellows, MD;  Location: [facility] ENDOSCOPY;  Service: Gastroenterology;  Laterality: N/A;  . COLONOSCOPY WITH PROPOFOL N/A 04/04/2017   Procedure: COLONOSCOPY WITH PROPOFOL;  Surgeon: Jonathon Bellows, MD;  Location: [facility] ENDOSCOPY;  Service: Gastroenterology;  Laterality: N/A;  . PACEMAKER IMPLANT    . US ECHOCARDIOGRAPHY  09/2010   EF 50%, mld LVH and dysfunction, mild RV dysfunction and enlarged    Prior to Admission medications   Medication Sig Start Date End Date Taking? Authorizing Provider  lisinopril (PRINIVIL,ZESTRIL) 5 MG tablet Take 5 mg by mouth daily.   Yes [provider]  albuterol (PROVENTIL HFA;VENTOLIN HFA) 108 (90 Base) MCG/ACT inhaler Inhale 2 puffs into the lungs every 6 (six) hours as needed for wheezing or shortness of breath. 03/27/17   Laban Emperor, PA-C  albuterol (PROVENTIL) (2.5 MG/3ML) 0.083% nebulizer solution Take 3 mLs (2.5 mg total) by nebulization every 6 (six) hours as needed for wheezing or shortness of breath. 03/31/17   Leone Haven, MD  loratadine (CLARITIN) 10 MG tablet Take 1 tablet (10 mg total) by mouth daily. 04/13/15   Leone Haven, MD  Multiple Vitamin (MULTIVITAMIN) tablet Take 1 tablet by mouth daily.    [provider]  ondansetron (ZOFRAN-ODT) 4 MG disintegrating tablet Take 1 tablet (4 mg total) by mouth every 8 (eight) hours as needed for nausea or vomiting. 10/23/17  Versie Starks, PA-C  traMADol (ULTRAM) 50 MG tablet Take 1 tablet (50 mg total) by mouth every 6 (six) hours as needed. 10/23/17   Versie Starks, PA-C    Allergies Codeine  Family History  Problem Relation Age of Onset  . Hypertension Father   . Cancer Paternal Grandfather        lung, smoker  . Cancer Paternal Uncle        bone  . Diabetes Mother   . CAD Mother 64       MI  . Stroke Neg Hx     Social History Social History   Tobacco Use  . Smoking  status: Never Smoker  . Smokeless tobacco: Never Used  Substance Use Topics  . Alcohol use: No  . Drug use: No    Review of Systems  Constitutional: No fever/chills Eyes: No visual changes. ENT: No sore throat. Respiratory: Denies cough Genitourinary: Negative for dysuria. Musculoskeletal: Positive for back pain. Skin: Negative for rash.    ____________________________________________   PHYSICAL EXAM:  VITAL SIGNS: ED Triage Vitals  Enc Vitals Group     BP 10/23/17 1038 117/60     Pulse Rate 10/23/17 1038 65     Resp 10/23/17 1038 18     Temp 10/23/17 1038 97.9 F (36.6 C)     Temp Source 10/23/17 1038 Oral     SpO2 10/23/17 1038 100 %     Weight 10/23/17 1038 200 lb (90.7 kg)     Height 10/23/17 1038 5\' 11"  (1.803 m)     Head Circumference --      Peak Flow --      Pain Score 10/23/17 1044 5     Pain Loc --      Pain Edu? --      Excl. in [place]? --     Constitutional: Alert and oriented. Well appearing and in no acute distress. Eyes: Conjunctivae are normal.  Head: Atraumatic. Nose: No congestion/rhinnorhea. Mouth/Throat: Mucous membranes are moist.   Cardiovascular: Normal rate, regular rhythm. Respiratory: Normal respiratory effort.  No retractions GU: deferred Musculoskeletal: Lumbar spine and coccyx are slightly tender.  Patient does walk with a cane and walks slowly.  Neurovascular is intact. Neurologic:  Normal speech and language.  Skin:  Skin is warm, dry and intact. No rash noted. Psychiatric: Mood and affect are normal. Speech and behavior are normal.  ____________________________________________   LABS (all labs ordered are listed, but only abnormal results are displayed)  Labs Reviewed - No data to display ____________________________________________   ____________________________________________  RADIOLOGY  X-ray lumbar spine and coccyx are negative for fracture.  The x-ray of the sacrum and coccyx shows AVN of the head of the  femurs.  ____________________________________________   PROCEDURES  Procedure(s) performed: No  Procedures    ____________________________________________   INITIAL IMPRESSION / ASSESSMENT AND PLAN / ED COURSE  Pertinent labs & imaging results that were available during my care of the patient were reviewed by me and considered in my medical decision making (see chart for details).  Patient is 52 year old male with history of muscular dystrophy that fell down one step.  He states he lost his balance and fell on Thursday.  He is complaining of low back pain.  On physical exam the patient's lumbar spine and coccyx are slightly tender.  Patient walks slowly and with a cane.  Remainder the exam is unremarkable.  X-ray lumbar spine and sacrum/coccyx ordered.  X-ray is negative for fracture.  However the sacrum and coccyx x-ray shows AVN of the femoral heads  Discussed the findings with patient.  He is to follow-up with his regular doctor or orthopedics at [facility] clinic as he is a [facility] patient.  He was given a prescription for tramadol.  Had concerns of given him a narcotic discussed this with him and his wife as he Artie has balance problems.  I do not want him to fall.  She states they will be very careful with the medication and trying to do Tylenol and Aleve together to see if this helps instead of using the narcotic.  He was also given a prescription for Zofran in case he gets nauseated from the narcotic.  He was discharged in stable condition in the care of his wife.    As part of my medical decision making, I reviewed the following data within the [place] notes reviewed and incorporated, Old chart reviewed, Radiograph reviewed x-ray of the lumbar spine is negative, x-ray of sacrum and coccyx is negative for fracture but shows AVN of the femoral heads, Notes from prior ED visits and [place] Controlled Substance  Database  ____________________________________________   FINAL CLINICAL IMPRESSION(S) / ED DIAGNOSES  Final diagnoses:  Fall, initial encounter  Coccyx contusion, initial encounter  Avascular necrosis of femur, unspecified laterality ([place])      NEW MEDICATIONS STARTED DURING THIS VISIT:  Discharge Medication List as of 10/23/2017 11:43 AM    START taking these medications   Details  traMADol (ULTRAM) 50 MG tablet Take 1 tablet (50 mg total) by mouth every 6 (six) hours as needed., Starting Mon 10/23/2017, Normal         Note:  This document was prepared using Dragon voice recognition software and may include unintentional dictation errors.    Versie Starks, PA-C 10/23/17 1439    Merlyn Lot, MD 10/23/17 1447

## 2017-10-23 NOTE — Discharge Instructions (Addendum)
Follow-up with your regular doctor or Dr. Clydell Hakim office for a recheck of the avascular necrosis of the hip.  Take the medication as prescribed.  You may also take Tylenol and Aleve.  The tramadol is for pain not controlled by the Tylenol and Aleve.  Zofran is for nausea associated with a narcotic.  You do not have to take this medication unless needed.  Try ice to the lower back.

## 2017-10-23 NOTE — ED Notes (Signed)
See triage note  Presents s/p fall  States he fell down 1 step and landed on buttocks   Having pain to lower back

## 2017-10-23 NOTE — ED Triage Notes (Signed)
Pt to ed with c/o losing balance and fall on Thursday down 2 cement stairs landing on buttock area.  Pt states pain continues.  Denies hitting head. Alert and oriented at this time. Walks with cane at triage.

## 2017-11-01 ENCOUNTER — Emergency Department
Admission: EM | Admit: 2017-11-01 | Discharge: 2017-11-02 | Disposition: A | Discharge status: Home / Self Care | Payer: Medicare HMO | Attending: Emergency Medicine | Admitting: Emergency Medicine

## 2017-11-01 ENCOUNTER — Encounter: Payer: Self-pay | Admitting: Emergency Medicine

## 2017-11-01 ENCOUNTER — Other Ambulatory Visit: Payer: Self-pay

## 2017-11-01 ENCOUNTER — Emergency Department: Payer: Medicare HMO

## 2017-11-01 DIAGNOSIS — R05 Cough: Secondary | ICD-10-CM | POA: Diagnosis not present

## 2017-11-01 DIAGNOSIS — Z95810 Presence of automatic (implantable) cardiac defibrillator: Secondary | ICD-10-CM | POA: Diagnosis not present

## 2017-11-01 DIAGNOSIS — Z79899 Other long term (current) drug therapy: Secondary | ICD-10-CM | POA: Diagnosis not present

## 2017-11-01 DIAGNOSIS — R1110 Vomiting, unspecified: Secondary | ICD-10-CM | POA: Diagnosis not present

## 2017-11-01 DIAGNOSIS — K529 Noninfective gastroenteritis and colitis, unspecified: Secondary | ICD-10-CM

## 2017-11-01 DIAGNOSIS — I447 Left bundle-branch block, unspecified: Secondary | ICD-10-CM | POA: Diagnosis not present

## 2017-11-01 DIAGNOSIS — R197 Diarrhea, unspecified: Secondary | ICD-10-CM | POA: Diagnosis not present

## 2017-11-01 LAB — CBC
HEMATOCRIT: 43.6 % (ref 40.0–52.0)
Hemoglobin: 15.5 g/dL (ref 13.0–18.0)
MCH: 34.2 pg — ABNORMAL HIGH (ref 26.0–34.0)
MCHC: 35.6 g/dL (ref 32.0–36.0)
MCV: 96 fL (ref 80.0–100.0)
PLATELETS: 189 10*3/uL (ref 150–440)
RBC: 4.54 MIL/uL (ref 4.40–5.90)
RDW: 14.1 % (ref 11.5–14.5)
WBC: 6.4 10*3/uL (ref 3.8–10.6)

## 2017-11-01 LAB — COMPREHENSIVE METABOLIC PANEL
ALBUMIN: 4 g/dL (ref 3.5–5.0)
ALT: 23 U/L (ref 0–44)
ANION GAP: 7 (ref 5–15)
AST: 21 U/L (ref 15–41)
Alkaline Phosphatase: 62 U/L (ref 38–126)
BILIRUBIN TOTAL: 2 mg/dL — AB (ref 0.3–1.2)
BUN: 11 mg/dL (ref 6–20)
CHLORIDE: 106 mmol/L (ref 98–111)
CO2: 29 mmol/L (ref 22–32)
Calcium: 8.8 mg/dL — ABNORMAL LOW (ref 8.9–10.3)
Creatinine, Ser: 0.85 mg/dL (ref 0.61–1.24)
GFR calc Af Amer: >60 mL/min (ref >60)
GFR calc non Af Amer: >60 mL/min (ref >60)
Glucose, Bld: 110 mg/dL — ABNORMAL HIGH (ref 70–99)
POTASSIUM: 3.6 mmol/L (ref 3.5–5.1)
SODIUM: 142 mmol/L (ref 135–145)
TOTAL PROTEIN: 7.6 g/dL (ref 6.5–8.1)

## 2017-11-01 LAB — LACTIC ACID, PLASMA: Lactic Acid, Venous: 1.1 mmol/L (ref 0.5–1.9)

## 2017-11-01 LAB — TROPONIN I: Troponin I: <0.03 ng/mL (ref <0.03)

## 2017-11-01 MED ORDER — SODIUM CHLORIDE 0.9 % IV BOLUS
1000.0000 mL | Freq: Once | INTRAVENOUS | Status: AC
  Administered 2017-11-01: 1000 mL via INTRAVENOUS

## 2017-11-01 MED ORDER — IOHEXOL 300 MG/ML  SOLN
100.0000 mL | Freq: Once | INTRAMUSCULAR | Status: AC | PRN
  Administered 2017-11-01: 100 mL via INTRAVENOUS

## 2017-11-01 MED ORDER — ONDANSETRON HCL 4 MG PO TABS: 4.0000 mg | ORAL_TABLET | Freq: Three times a day (TID) | ORAL | 0 refills | Status: DC | PRN

## 2017-11-01 NOTE — ED Provider Notes (Addendum)
[facility] Emergency Department Provider Note  ____________________________________________   I have reviewed the triage vital signs and the nursing notes. Where available I have reviewed prior notes and, if possible and indicated, outside hospital notes.    HISTORY  Chief Complaint Fever and Emesis    HPI James [name] is a 52 y.o. male  History of myotonic dystrophy, and aspiration pneumonia past left bundle branch block, reflux disease, and cardiomyopathy last echo showed 40% presents today with 5 episodes of diarrhea today.  Had vomiting for a few days the vomiting is now passed but now is having diarrhea.  Nonbloody, no melena no bright red blood per rectum no hematemesis.  He states he has abdominal cramping that localizes to the right lower quadrant.  Does not recall having had his appendix out.  Positive sick contacts, no high fever he did feel warm at home.  Seems to have less energy than normal.  No other complaints.  Mild crampy discomfort periumbilical mostly but also to the right lower quadrant.  Seems to be worse when he needs to have a bowel movement relieved by having bowel movements.     Past Medical History:  Diagnosis Date  . Bronchitis   . Cardiomyopathy ([place])   . Dysphagia   . GERD (gastroesophageal reflux disease)   . History of chicken pox   . History of pneumonia 2013   double PNA at [facility]  . LBBB (left bundle branch block)   . Myotonic dystrophy, type 1 ([place])    followed by [facility]  . Pneumonia    aspiration  . Sleep apnea     Patient Active Problem List   Diagnosis Date Noted  . Positive colorectal cancer screening using Cologuard test 03/31/2017  . ICD (implantable cardioverter-defibrillator) in place 09/23/2016  . Abnormal EKG 09/10/2016  . Eustachian tube dysfunction, left 08/29/2016  . Bronchitis 04/07/2016  . Prediabetes 12/01/2015  . Heart palpitations 09/25/2015  . Obstructive sleep apnea 06/26/2015  . Rotator cuff  tendinitis 05/22/2015  . Dysphagia   . 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 Male hypogonadism 06/01/2013  . Cardiomyopathy ([place])   . Left bundle branch block   . Myotonic dystrophy, type 1 ([place])     Past Surgical History:  Procedure Laterality Date  . CARDIAC DEFIBRILLATOR PLACEMENT     defib/pacer  . CARDIAC DEFIBRILLATOR PLACEMENT    . COLONOSCOPY WITH PROPOFOL N/A 08/16/2016   Procedure: COLONOSCOPY WITH PROPOFOL;  Surgeon: Jonathon Bellows, MD;  Location: [facility] ENDOSCOPY;  Service: Gastroenterology;  Laterality: N/A;  . COLONOSCOPY WITH PROPOFOL N/A 04/04/2017   Procedure: COLONOSCOPY WITH PROPOFOL;  Surgeon: Jonathon Bellows, MD;  Location: [facility] ENDOSCOPY;  Service: Gastroenterology;  Laterality: N/A;  . PACEMAKER IMPLANT    . US ECHOCARDIOGRAPHY  09/2010   EF 50%, mld LVH and dysfunction, mild RV dysfunction and enlarged    Prior to Admission medications   Medication Sig Start Date End Date Taking? Authorizing Provider  albuterol (PROVENTIL HFA;VENTOLIN HFA) 108 (90 Base) MCG/ACT inhaler Inhale 2 puffs into the lungs every 6 (six) hours as needed for wheezing or shortness of breath. 03/27/17   Laban Emperor, PA-C  albuterol (PROVENTIL) (2.5 MG/3ML) 0.083% nebulizer solution Take 3 mLs (2.5 mg total) by nebulization every 6 (six) hours as needed for wheezing or shortness of breath. 03/31/17   Leone Haven, MD  lisinopril (PRINIVIL,ZESTRIL) 5 MG tablet Take 5  mg by mouth daily.    [provider]  loratadine (CLARITIN) 10 MG tablet Take 1 tablet (10 mg total) by mouth daily. 04/13/15   Leone Haven, MD  Multiple Vitamin (MULTIVITAMIN) tablet Take 1 tablet by mouth daily.    [provider]  ondansetron (ZOFRAN-ODT) 4 MG disintegrating tablet Take 1 tablet (4 mg total) by mouth every 8 (eight) hours as needed for nausea or vomiting. 10/23/17   Caryn Section Linden Dolin, PA-C   traMADol (ULTRAM) 50 MG tablet Take 1 tablet (50 mg total) by mouth every 6 (six) hours as needed. 10/23/17   Versie Starks, PA-C    Allergies Codeine  Family History  Problem Relation Age of Onset  . Hypertension Father   . Cancer Paternal Grandfather        lung, smoker  . Cancer Paternal Uncle        bone  . Diabetes Mother   . CAD Mother 20       MI  . Stroke Neg Hx     Social History Social History   Tobacco Use  . Smoking status: Never Smoker  . Smokeless tobacco: Never Used  Substance Use Topics  . Alcohol use: No  . Drug use: No    Review of Systems Constitutional: He felt warm Eyes: No visual changes. ENT: No sore throat. No stiff neck no neck pain Cardiovascular: Denies chest pain. Respiratory: Denies shortness of breath. Gastrointestinal:   See HPI. Genitourinary: Negative for dysuria. Musculoskeletal: Negative lower extremity swelling Skin: Negative for rash. Neurological: Negative for severe headaches, focal weakness or numbness.   ____________________________________________   PHYSICAL EXAM:  VITAL SIGNS: ED Triage Vitals  Enc Vitals Group     BP 11/01/17 2046 105/70     Pulse Rate 11/01/17 2046 91     Resp 11/01/17 2046 20     Temp 11/01/17 2046 98.4 F (36.9 C)     Temp Source 11/01/17 2046 Oral     SpO2 11/01/17 2046 94 %     Weight 11/01/17 2042 200 lb (90.7 kg)     Height 11/01/17 2042 5\' 11"  (1.803 m)     Head Circumference --      Peak Flow --      Pain Score 11/01/17 2041 0     Pain Loc --      Pain Edu? --      Excl. in [place]? --     Constitutional: Alert and oriented. Well appearing and in no acute distress. Eyes: Conjunctivae are normal Head: Atraumatic HEENT: No congestion/rhinnorhea. Mucous membranes are moist.  Oropharynx non-erythematous Neck:   Nontender with no meningismus, no masses, no stridor Cardiovascular: Normal rate, regular rhythm. Grossly normal heart sounds.  Good peripheral circulation. Respiratory:  Normal respiratory effort.  No retractions. Lungs CTAB. Abdominal: Soft and's mild discomfort noted to the right lower quadrant, distended slightly abdomen also obesity which limits exam not tympanic.  No guarding no rebound Back:  There is no focal tenderness or step off.  there is no midline tenderness there are no lesions noted. there is no CVA tenderness Musculoskeletal: No lower extremity tenderness, no upper extremity tenderness. No joint effusions, no DVT signs strong distal pulses no edema Neurologic:  Normal speech and language. No gross focal neurologic deficits are appreciated.  Skin:  Skin is warm, dry and intact. No rash noted. Psychiatric: Mood and affect are normal. Speech and behavior are normal.  ____________________________________________   LABS (all labs ordered are listed,  but only abnormal results are displayed)  Labs Reviewed  CBC - Abnormal; Notable for the following components:      Result Value   MCH 34.2 (*)    All other components within normal limits  COMPREHENSIVE METABOLIC PANEL - Abnormal; Notable for the following components:   Glucose, Bld 110 (*)    Calcium 8.8 (*)    Total Bilirubin 2.0 (*)    All other components within normal limits  TROPONIN I  LACTIC ACID, PLASMA  LACTIC ACID, PLASMA  URINALYSIS, COMPLETE (UACMP) WITH MICROSCOPIC    Pertinent labs  results that were available during my care of the patient were reviewed by me and considered in my medical decision making (see chart for details). ____________________________________________  EKG  I personally interpreted any EKGs ordered by me or triage Old left bundle branch block, LAD, no acute change from prior ________________________________________  RADIOLOGY  Pertinent labs & imaging results that were available during my care of the patient were reviewed by me and considered in my medical decision making (see chart for details). If possible, patient and/or family made aware of any  abnormal findings.  Dg Chest 2 View  Result Date: 11/01/2017 CLINICAL DATA:  Episode of cough approximately 1 week ago than development of vomiting. EXAM: CHEST - 2 VIEW COMPARISON:  03/27/2017 FINDINGS: Bibasilar platelike atelectasis and/or scarring is seen at each lung base. ICD device projects over the left hemithorax with leads in the right atrium, right ventricle and coronary sinus. Heart is top-normal in size. Nonaneurysmal thoracic aorta. No acute osseous abnormality. IMPRESSION: No active cardiopulmonary disease. Bibasilar platelike atelectasis and/or scarring. Electronically Signed   By: Ashley Royalty M.D.   On: 11/01/2017 21:06   ____________________________________________    PROCEDURES  Procedure(s) performed: None  Procedures  Critical Care performed: None  ____________________________________________   INITIAL IMPRESSION / ASSESSMENT AND PLAN / ED COURSE  Pertinent labs & imaging results that were available during my care of the patient were reviewed by me and considered in my medical decision making (see chart for details). Patient here with nausea vomiting and diarrhea, initially nausea and vomiting and now mostly diarrhea.  Given his low EF I will give him only 500 cc bolus, I did change that order.  Patient is having crampy abdominal pain seems localized to the right lower quadrant some extent although I have low suspicion for appendicitis given his history, is a somewhat difficult exam, to reliably rule out appendicitis with so we will obtain imaging.  If that is negative is my hope and get him safely home.  ----------------------------------------- 11:18 PM on 11/01/2017 -----------------------------------------  Patient tolerating p.o. feels much better after IV fluids work-up remarkable for only most likely viral gastroenteritis cannot give a stool sample, states he took a little Imodium at home before coming in and that is made it so he does not longer have to have  bowel movements.  CT is reassuring blood work is reassuring abdomen is reassuring exam is reassuring, blood pressure he states at baseline is in the low 100s which is where he is now, patient will be discharged with close outpatient follow-up with family physician and return precautions were given understood.  Patient and family very comfortable with this plan.    ____________________________________________   FINAL CLINICAL IMPRESSION(S) / ED DIAGNOSES  Final diagnoses:  None      This chart was dictated using voice recognition software.  Despite best efforts to proofread,  errors can occur which can change meaning.  Schuyler Amor, MD 11/01/17 2219    Schuyler Amor, MD 11/01/17 [phone]

## 2017-11-01 NOTE — ED Triage Notes (Signed)
Patient was seen here on 7/25, seen for "bruised coccyx" after fall. Had episode of coughing approx one week ago, then developed vomiting. Patient states he shortly after developed chest pain that felt like reflux. Has had diarrhea, vomiting, fever, lethargy, and decreased oral intake for at least 4 days. Denies any current chest pain.

## 2017-11-06 DIAGNOSIS — R9431 Abnormal electrocardiogram [ECG] [EKG]: Secondary | ICD-10-CM | POA: Diagnosis not present

## 2017-11-06 DIAGNOSIS — I480 Paroxysmal atrial fibrillation: Secondary | ICD-10-CM | POA: Diagnosis not present

## 2017-11-06 DIAGNOSIS — I428 Other cardiomyopathies: Secondary | ICD-10-CM | POA: Diagnosis not present

## 2017-11-06 DIAGNOSIS — G4733 Obstructive sleep apnea (adult) (pediatric): Secondary | ICD-10-CM | POA: Diagnosis not present

## 2017-11-06 DIAGNOSIS — R002 Palpitations: Secondary | ICD-10-CM | POA: Diagnosis not present

## 2017-11-06 DIAGNOSIS — Z95810 Presence of automatic (implantable) cardiac defibrillator: Secondary | ICD-10-CM | POA: Diagnosis not present

## 2017-11-06 DIAGNOSIS — G7111 Myotonic muscular dystrophy: Secondary | ICD-10-CM | POA: Diagnosis not present

## 2017-11-06 DIAGNOSIS — I447 Left bundle-branch block, unspecified: Secondary | ICD-10-CM | POA: Diagnosis not present

## 2017-11-08 ENCOUNTER — Encounter: Payer: Self-pay | Admitting: Family Medicine

## 2017-11-08 ENCOUNTER — Ambulatory Visit (INDEPENDENT_AMBULATORY_CARE_PROVIDER_SITE_OTHER): Payer: Medicare HMO | Admitting: Family Medicine

## 2017-11-08 VITALS — BP 110/62 | HR 74 | Temp 98.2°F | Wt 205.6 lb

## 2017-11-08 DIAGNOSIS — G7111 Myotonic muscular dystrophy: Secondary | ICD-10-CM

## 2017-11-08 DIAGNOSIS — M87059 Idiopathic aseptic necrosis of unspecified femur: Secondary | ICD-10-CM | POA: Diagnosis not present

## 2017-11-08 DIAGNOSIS — W19XXXA Unspecified fall, initial encounter: Secondary | ICD-10-CM

## 2017-11-08 DIAGNOSIS — G4733 Obstructive sleep apnea (adult) (pediatric): Secondary | ICD-10-CM

## 2017-11-08 DIAGNOSIS — I429 Cardiomyopathy, unspecified: Secondary | ICD-10-CM | POA: Diagnosis not present

## 2017-11-08 DIAGNOSIS — R17 Unspecified jaundice: Secondary | ICD-10-CM | POA: Diagnosis not present

## 2017-11-08 LAB — COMPREHENSIVE METABOLIC PANEL
ALBUMIN: 3.9 g/dL (ref 3.5–5.2)
ALK PHOS: 101 U/L (ref 39–117)
ALT: 147 U/L — ABNORMAL HIGH (ref 0–53)
AST: 29 U/L (ref 0–37)
BUN: 9 mg/dL (ref 6–23)
CHLORIDE: 107 meq/L (ref 96–112)
CO2: 32 mEq/L (ref 19–32)
Calcium: 9.5 mg/dL (ref 8.4–10.5)
Creatinine, Ser: 0.8 mg/dL (ref 0.40–1.50)
GFR: 107.98 mL/min (ref >60.00)
Glucose, Bld: 106 mg/dL — ABNORMAL HIGH (ref 70–99)
POTASSIUM: 3.8 meq/L (ref 3.5–5.1)
SODIUM: 144 meq/L (ref 135–145)
TOTAL PROTEIN: 6.7 g/dL (ref 6.0–8.3)
Total Bilirubin: 0.8 mg/dL (ref 0.2–1.2)

## 2017-11-08 NOTE — Assessment & Plan Note (Signed)
He will continue to follow with neurology and cardiology.

## 2017-11-08 NOTE — Assessment & Plan Note (Signed)
He has been more compliant with his CPAP.  He will continue to see neurology.

## 2017-11-08 NOTE — Assessment & Plan Note (Signed)
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  Evaluated in the ED with no acute imaging changes.  He will use his cane to help ambulate.

## 2017-11-08 NOTE — Patient Instructions (Signed)
Nice to see you. We will recheck labs today. We will get you to see orthopedics for your hips. Please continue to see cardiology and neurology.

## 2017-11-08 NOTE — Assessment & Plan Note (Signed)
Stable.  Asymptomatic.  He will continue to see cardiology.

## 2017-11-08 NOTE — Assessment & Plan Note (Signed)
Refer to orthopedics for evaluation

## 2017-11-08 NOTE — Assessment & Plan Note (Addendum)
Potentially elevated in the setting of illness.  We will recheck today.  Gastroenteritis symptoms have improved.

## 2017-11-08 NOTE — Progress Notes (Signed)
[name], MD Phone: [phone]  James [name] is a 52 y.o. male who presents today for f/u.  CC: cardiomyopathy, OSA, gastroenteritis, fall  Cardiomyopathy: Taking lisinopril.  No orthopnea, PND, or shortness of breath.  No edema.  He continues to follow with cardiology.  OSA: He is on BiPAP.  He got a new mask.  He saw a neurologist regarding this.  He uses his machine at least 4 hours at night.  He is not quite sure exactly how he sleeping.  He does wake well rested.  Some hypersomnia during the day.  Patient was evaluated in the emergency department for abdominal discomfort with vomiting and then subsequently developed diarrhea.  He was diagnosed with gastroenteritis.  No blood in his stool.  No melena.  He has had no additional vomiting or diarrhea since late last week.  No abdominal pain.  Bilirubin was found to be elevated.  CT abdomen pelvis with evidence of diarrhea and possible mild colonic inflammation.  Patient additionally was previously evaluated in the emergency department for a fall.  He was walking down the stairs and fell backwards due to balance issues.  No loss of consciousness or head injury.  Landed on his coccyx.  Had an x-ray with no fracture.  He notes he still has some discomfort there.  He bruised his thumb though that is improving with no discomfort.  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  Social History   Tobacco Use  Smoking Status Never Smoker  Smokeless Tobacco Never Used     ROS see history of present illness  Objective  Physical Exam Vitals:   11/08/17 1012  BP: 110/62  Pulse: 74  Temp: 98.2 F (36.8 C)  SpO2: 96%    BP Readings from Last 3 Encounters:  11/08/17 110/62  11/02/17 110/69  10/23/17 117/60   Wt Readings from Last 3 Encounters:  11/08/17 205 lb 9.6 oz (93.3 kg)  11/01/17 200 lb (90.7 kg)  10/23/17 200 lb (90.7 kg)    Physical Exam  Constitutional: No distress.    Cardiovascular: Normal rate, regular rhythm and normal heart sounds.  Pulmonary/Chest: Effort normal and breath sounds normal.  Abdominal: Soft. Bowel sounds are normal. He exhibits no distension. There is no tenderness. There is no rebound and no guarding.  Musculoskeletal: He exhibits no edema.  No tenderness over his sacrum or coccyx, no overlying skin changes, patient's right thumb with no apparent bruising, no tenderness  Neurological: He is alert.  Skin: Skin is warm and dry. He is not diaphoretic.     Assessment/Plan: Please see individual problem list.  Cardiomyopathy ([place]) Stable.  Asymptomatic.  He will continue to see cardiology.  Obstructive sleep apnea He has been more compliant with his CPAP.  He will continue to see neurology.  Avascular necrosis of hip, unspecified laterality ([place]) Refer to orthopedics for evaluation.  Myotonic dystrophy, type 1 ([place]) He will continue to follow with neurology and cardiology.  Elevated bilirubin Potentially elevated in the setting of illness.  We will recheck today.  Gastroenteritis symptoms have improved.  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  Evaluated in the ED with no acute imaging changes.  He will use his cane to help ambulate.    Orders Placed This Encounter  Procedures  . Comp Met (CMET)  . Ambulatory referral to Orthopedic Surgery    Referral Priority:   Routine    Referral Type:   Surgical    Referral Reason:  Specialty Services Required    Requested Specialty:   Orthopedic Surgery    Number of Visits Requested:   1    No orders of the defined types were placed in this encounter.    [name], MD [place]

## 2017-11-09 ENCOUNTER — Telehealth: Payer: Self-pay

## 2017-11-09 DIAGNOSIS — R740 Nonspecific elevation of levels of transaminase and lactic acid dehydrogenase [LDH]: Principal | ICD-10-CM

## 2017-11-09 DIAGNOSIS — R7401 Elevation of levels of liver transaminase levels: Secondary | ICD-10-CM

## 2017-11-09 NOTE — Telephone Encounter (Signed)
-----   Message from Leone Haven, MD sent at 11/08/2017  5:08 PM EDT ----- Please let the patient know that 1 of his liver enzymes is elevated.  His bilirubin is now normal.  I would like to recheck a hepatic function panel in 1 week to see where his ALT is.  Please place an order for this for elevated ALT.  His other lab work is acceptable.  Thanks.

## 2017-11-17 ENCOUNTER — Other Ambulatory Visit (INDEPENDENT_AMBULATORY_CARE_PROVIDER_SITE_OTHER): Payer: Medicare HMO

## 2017-11-17 DIAGNOSIS — R740 Nonspecific elevation of levels of transaminase and lactic acid dehydrogenase [LDH]: Secondary | ICD-10-CM

## 2017-11-17 DIAGNOSIS — R7401 Elevation of levels of liver transaminase levels: Secondary | ICD-10-CM

## 2017-11-17 NOTE — Addendum Note (Signed)
Addended by: Leeanne Rio on: 11/17/2017 02:55 PM   Modules accepted: Orders

## 2017-11-18 LAB — HEPATIC FUNCTION PANEL
ALT: 42 IU/L (ref 0–44)
AST: 23 IU/L (ref 0–40)
Albumin: 4.3 g/dL (ref 3.5–5.5)
Alkaline Phosphatase: 83 IU/L (ref 39–117)
BILIRUBIN, DIRECT: 0.24 mg/dL (ref 0.00–0.40)
Bilirubin Total: 1.1 mg/dL (ref 0.0–1.2)
Total Protein: 6.8 g/dL (ref 6.0–8.5)

## 2017-12-29 DIAGNOSIS — Z95810 Presence of automatic (implantable) cardiac defibrillator: Secondary | ICD-10-CM | POA: Diagnosis not present

## 2017-12-29 DIAGNOSIS — Z23 Encounter for immunization: Secondary | ICD-10-CM | POA: Diagnosis not present

## 2017-12-29 DIAGNOSIS — I480 Paroxysmal atrial fibrillation: Secondary | ICD-10-CM | POA: Diagnosis not present

## 2017-12-29 DIAGNOSIS — I428 Other cardiomyopathies: Secondary | ICD-10-CM | POA: Diagnosis not present

## 2018-01-11 DIAGNOSIS — G4733 Obstructive sleep apnea (adult) (pediatric): Secondary | ICD-10-CM | POA: Diagnosis not present

## 2018-01-11 DIAGNOSIS — J189 Pneumonia, unspecified organism: Secondary | ICD-10-CM | POA: Diagnosis not present

## 2018-01-11 DIAGNOSIS — G7100 Muscular dystrophy, unspecified: Secondary | ICD-10-CM | POA: Diagnosis not present

## 2018-01-11 DIAGNOSIS — J9611 Chronic respiratory failure with hypoxia: Secondary | ICD-10-CM | POA: Diagnosis not present

## 2018-01-11 DIAGNOSIS — J129 Viral pneumonia, unspecified: Secondary | ICD-10-CM | POA: Diagnosis not present

## 2018-02-16 ENCOUNTER — Encounter: Payer: Self-pay | Admitting: Family Medicine

## 2018-02-16 ENCOUNTER — Ambulatory Visit (INDEPENDENT_AMBULATORY_CARE_PROVIDER_SITE_OTHER): Payer: Medicare HMO | Admitting: Family Medicine

## 2018-02-16 VITALS — BP 118/76 | HR 68 | Temp 97.8°F | Ht 71.0 in | Wt 212.0 lb

## 2018-02-16 DIAGNOSIS — K5900 Constipation, unspecified: Secondary | ICD-10-CM | POA: Insufficient documentation

## 2018-02-16 DIAGNOSIS — M87059 Idiopathic aseptic necrosis of unspecified femur: Secondary | ICD-10-CM | POA: Diagnosis not present

## 2018-02-16 DIAGNOSIS — I429 Cardiomyopathy, unspecified: Secondary | ICD-10-CM

## 2018-02-16 DIAGNOSIS — R200 Anesthesia of skin: Secondary | ICD-10-CM

## 2018-02-16 DIAGNOSIS — R1310 Dysphagia, unspecified: Secondary | ICD-10-CM

## 2018-02-16 NOTE — Progress Notes (Signed)
[name], MD Phone: [phone]  James [name] is a 52 y.o. male who presents today for f/u.  CC: chf, avascular necrosis hips, positional numbness, constipation, swallow difficulty  CHF: He notes chronic dyspnea on exertion that is mild.  Notes this is stable.  He has discussed it with his cardiologist.  No orthopnea or PND.  No ICD discharges.  Avascular necrosis of his hips: He did not see orthopedics.  He notes no hip pain.  He would be interested in seeing orthopedics at [facility].  Swallowing difficulty: Patient had evaluation through his neurologist for this.  He was found to have moderate oropharyngeal dysphagia.  He was recommended compensatory mechanisms.  He is done relatively well with this.  Only occasionally feels like he gets choked on meat.  Patient reports his right arm fell asleep yesterday while he was holding it up on his wife.  He changed positions and the numbness went away quickly.  He notes no numbness or weakness otherwise.  No other symptoms with this.  Patient notes prior issues with constipation.  He notes he does go to the bathroom multiple times a day now.  Particularly if he eats mashed potatoes or macaroni and cheese.  He notes no abdominal pain.  No blood in his stool.  He does note taking Dulcolax as needed for this.  Social History   Tobacco Use  Smoking Status Never Smoker  Smokeless Tobacco Never Used     ROS see history of present illness  Objective  Physical Exam Vitals:   02/16/18 1005  BP: 118/76  Pulse: 68  Temp: 97.8 F (36.6 C)  SpO2: 97%    BP Readings from Last 3 Encounters:  02/16/18 118/76  11/08/17 110/62  11/02/17 110/69   Wt Readings from Last 3 Encounters:  02/16/18 212 lb (96.2 kg)  11/08/17 205 lb 9.6 oz (93.3 kg)  11/01/17 200 lb (90.7 kg)    Physical Exam  Constitutional: No distress.  Cardiovascular: Normal rate, regular rhythm and normal heart sounds.  Pulmonary/Chest: Effort normal and breath sounds  normal.  Musculoskeletal: He exhibits no edema.  Neurological: He is alert.  CN 2-12 intact, 5/5 strength in bilateral biceps, triceps, grip, quads, hamstrings, plantar and dorsiflexion, sensation to light touch intact in bilateral UE and LE, absent patellar reflexes  Skin: Skin is warm and dry. He is not diaphoretic.     Assessment/Plan: Please see individual problem list.  Cardiomyopathy ([place]) Overall doing well.  He will continue to see cardiology.  Dysphagia Patient has been evaluated by his neurologist for this.  He has done fairly well with compensatory mechanisms.  Avascular necrosis of hip, unspecified laterality ([place]) Refer to orthopedics at [facility].  Numbness Numbness and tingling in his right upper extremity was positional in nature.  Resolved with change in position.  This is related to impingement based on the position of his arm.  Discussed monitoring and avoiding positions that lead to the numbness.  If this recurs and is persistent or he develops any other neurological symptoms he will go to the emergency room.  Constipation No constipation currently.  Discussed monitoring.  Advised not to take Dulcolax daily.   Orders Placed This Encounter  Procedures  . Ambulatory referral to Orthopedic Surgery    Referral Priority:   Routine    Referral Type:   Surgical    Referral Reason:   Specialty Services Required    Requested Specialty:   Orthopedic Surgery    Number of Visits Requested:  1    No orders of the defined types were placed in this encounter.    [name], MD [place]

## 2018-02-16 NOTE — Assessment & Plan Note (Signed)
Refer to orthopedics at [facility].

## 2018-02-16 NOTE — Assessment & Plan Note (Signed)
Overall doing well.  He will continue to see cardiology.

## 2018-02-16 NOTE — Assessment & Plan Note (Signed)
Patient has been evaluated by his neurologist for this.  He has done fairly well with compensatory mechanisms.

## 2018-02-16 NOTE — Assessment & Plan Note (Signed)
No constipation currently.  Discussed monitoring.  Advised not to take Dulcolax daily.

## 2018-02-16 NOTE — Assessment & Plan Note (Signed)
Numbness and tingling in his right upper extremity was positional in nature.  Resolved with change in position.  This is related to impingement based on the position of his arm.  Discussed monitoring and avoiding positions that lead to the numbness.  If this recurs and is persistent or he develops any other neurological symptoms he will go to the emergency room.

## 2018-02-16 NOTE — Patient Instructions (Signed)
Nice to see you. We will get you to see orthopedics. If you develop any non-positional numbness please go to the emergency room.

## 2018-03-08 DIAGNOSIS — M87052 Idiopathic aseptic necrosis of left femur: Secondary | ICD-10-CM | POA: Diagnosis not present

## 2018-03-08 DIAGNOSIS — M25551 Pain in right hip: Secondary | ICD-10-CM | POA: Diagnosis not present

## 2018-03-08 DIAGNOSIS — M87051 Idiopathic aseptic necrosis of right femur: Secondary | ICD-10-CM | POA: Diagnosis not present

## 2018-03-08 DIAGNOSIS — M25552 Pain in left hip: Secondary | ICD-10-CM | POA: Diagnosis not present

## 2018-03-16 DIAGNOSIS — R0609 Other forms of dyspnea: Secondary | ICD-10-CM | POA: Diagnosis not present

## 2018-03-16 DIAGNOSIS — G7111 Myotonic muscular dystrophy: Secondary | ICD-10-CM | POA: Diagnosis not present

## 2018-03-16 DIAGNOSIS — R05 Cough: Secondary | ICD-10-CM | POA: Diagnosis not present

## 2018-04-03 DIAGNOSIS — G4733 Obstructive sleep apnea (adult) (pediatric): Secondary | ICD-10-CM | POA: Diagnosis not present

## 2018-04-20 DIAGNOSIS — M79651 Pain in right thigh: Secondary | ICD-10-CM | POA: Diagnosis not present

## 2018-04-20 DIAGNOSIS — R262 Difficulty in walking, not elsewhere classified: Secondary | ICD-10-CM | POA: Diagnosis not present

## 2018-04-20 DIAGNOSIS — M6281 Muscle weakness (generalized): Secondary | ICD-10-CM | POA: Diagnosis not present

## 2018-04-20 DIAGNOSIS — G7111 Myotonic muscular dystrophy: Secondary | ICD-10-CM | POA: Diagnosis not present

## 2018-04-20 DIAGNOSIS — R296 Repeated falls: Secondary | ICD-10-CM | POA: Diagnosis not present

## 2018-05-21 DIAGNOSIS — I428 Other cardiomyopathies: Secondary | ICD-10-CM | POA: Diagnosis not present

## 2018-05-21 DIAGNOSIS — R0609 Other forms of dyspnea: Secondary | ICD-10-CM | POA: Diagnosis not present

## 2018-05-21 DIAGNOSIS — I517 Cardiomegaly: Secondary | ICD-10-CM | POA: Diagnosis not present

## 2018-05-21 DIAGNOSIS — R002 Palpitations: Secondary | ICD-10-CM | POA: Diagnosis not present

## 2018-05-21 DIAGNOSIS — G4733 Obstructive sleep apnea (adult) (pediatric): Secondary | ICD-10-CM | POA: Diagnosis not present

## 2018-05-21 DIAGNOSIS — G7111 Myotonic muscular dystrophy: Secondary | ICD-10-CM | POA: Diagnosis not present

## 2018-05-21 DIAGNOSIS — I447 Left bundle-branch block, unspecified: Secondary | ICD-10-CM | POA: Diagnosis not present

## 2018-05-21 DIAGNOSIS — Z79899 Other long term (current) drug therapy: Secondary | ICD-10-CM | POA: Diagnosis not present

## 2018-05-21 DIAGNOSIS — R9431 Abnormal electrocardiogram [ECG] [EKG]: Secondary | ICD-10-CM | POA: Diagnosis not present

## 2018-05-21 DIAGNOSIS — I480 Paroxysmal atrial fibrillation: Secondary | ICD-10-CM | POA: Diagnosis not present

## 2018-05-21 DIAGNOSIS — Z95810 Presence of automatic (implantable) cardiac defibrillator: Secondary | ICD-10-CM | POA: Diagnosis not present

## 2018-05-30 DIAGNOSIS — G7111 Myotonic muscular dystrophy: Secondary | ICD-10-CM | POA: Diagnosis not present

## 2018-05-30 DIAGNOSIS — R262 Difficulty in walking, not elsewhere classified: Secondary | ICD-10-CM | POA: Diagnosis not present

## 2018-05-30 DIAGNOSIS — M6281 Muscle weakness (generalized): Secondary | ICD-10-CM | POA: Diagnosis not present

## 2018-06-18 ENCOUNTER — Ambulatory Visit: Payer: Medicare HMO

## 2018-06-18 ENCOUNTER — Ambulatory Visit: Payer: Medicare HMO | Admitting: Family Medicine

## 2018-06-27 ENCOUNTER — Ambulatory Visit: Payer: Medicare HMO

## 2018-07-09 DIAGNOSIS — Z95810 Presence of automatic (implantable) cardiac defibrillator: Secondary | ICD-10-CM | POA: Diagnosis not present

## 2018-07-09 DIAGNOSIS — I471 Supraventricular tachycardia: Secondary | ICD-10-CM | POA: Diagnosis not present

## 2018-07-09 DIAGNOSIS — Z4502 Encounter for adjustment and management of automatic implantable cardiac defibrillator: Secondary | ICD-10-CM | POA: Diagnosis not present

## 2018-07-16 ENCOUNTER — Other Ambulatory Visit: Payer: Self-pay

## 2018-07-16 ENCOUNTER — Ambulatory Visit: Payer: Medicare HMO

## 2018-08-30 ENCOUNTER — Ambulatory Visit: Payer: Medicare HMO

## 2018-08-30 ENCOUNTER — Other Ambulatory Visit: Payer: Self-pay

## 2018-09-10 ENCOUNTER — Other Ambulatory Visit: Payer: Self-pay

## 2018-09-10 ENCOUNTER — Ambulatory Visit (INDEPENDENT_AMBULATORY_CARE_PROVIDER_SITE_OTHER): Payer: Medicare HMO

## 2018-09-10 DIAGNOSIS — Z0000 Encounter for general adult medical examination without abnormal findings: Secondary | ICD-10-CM | POA: Diagnosis not present

## 2018-09-10 NOTE — Progress Notes (Signed)
Subjective:   James [name] is a 53 y.o. male who presents for Medicare Annual/Subsequent preventive examination.  Review of Systems:  No ROS.  Medicare Wellness Virtual Visit.  Visual/audio telehealth visit, UTA vital signs.   See social history for additional risk factors.   Cardiac Risk Factors include: male gender     Objective:    Vitals: There were no vitals taken for this visit.  There is no height or weight on file to calculate BMI.  Advanced Directives 09/10/2018 11/01/2017 10/23/2017 04/04/2017 03/27/2017 08/16/2016 05/19/2016  Does Patient Have a Medical Advance Directive? No No No No No No No  Would patient like information on creating a medical advance directive? Yes (MAU/Ambulatory/Procedural Areas - Information given) No - Patient declined No - Patient declined - - No - Patient declined No - Patient declined    Tobacco Social History   Tobacco Use  Smoking Status Never Smoker  Smokeless Tobacco Never Used     Counseling given: Not Answered   Clinical Intake:  Pre-visit preparation completed: Yes        Diabetes: No  How often do you need to have someone help you when you read instructions, pamphlets, or other written materials from your doctor or pharmacy?: 2 - Rarely  Interpreter Needed?: No     Past Medical History:  Diagnosis Date  . Bronchitis   . Cardiomyopathy ([place])   . Dysphagia   . GERD (gastroesophageal reflux disease)   . History of chicken pox   . History of pneumonia 2013   double PNA at [facility]  . LBBB (left bundle branch block)   . Myotonic dystrophy, type 1 ([place])    followed by [facility]  . Pneumonia    aspiration  . Sleep apnea    Past Surgical History:  Procedure Laterality Date  . CARDIAC DEFIBRILLATOR PLACEMENT     defib/pacer  . CARDIAC DEFIBRILLATOR PLACEMENT    . COLONOSCOPY WITH PROPOFOL N/A 08/16/2016   Procedure: COLONOSCOPY WITH PROPOFOL;  Surgeon: Jonathon Bellows, MD;  Location: [facility] ENDOSCOPY;  Service: Gastroenterology;   Laterality: N/A;  . COLONOSCOPY WITH PROPOFOL N/A 04/04/2017   Procedure: COLONOSCOPY WITH PROPOFOL;  Surgeon: Jonathon Bellows, MD;  Location: [facility] ENDOSCOPY;  Service: Gastroenterology;  Laterality: N/A;  . PACEMAKER IMPLANT    . US ECHOCARDIOGRAPHY  09/2010   EF 50%, mld LVH and dysfunction, mild RV dysfunction and enlarged   Family History  Problem Relation Age of Onset  . Hypertension Father   . Cancer Paternal Grandfather        lung, smoker  . Cancer Paternal Uncle        bone  . Diabetes Mother   . CAD Mother 24       MI  . Stroke Neg Hx    Social History   Socioeconomic History  . Marital status: Married    Spouse name: Not on file  . Number of children: Not on file  . Years of education: Not on file  . Highest education level: Not on file  Occupational History  . Not on file  Social Needs  . Financial resource strain: Not hard at all  . Food insecurity    Worry: Never true    Inability: Never true  . Transportation needs    Medical: No    Non-medical: No  Tobacco Use  . Smoking status: Never Smoker  . Smokeless tobacco: Never Used  Substance and Sexual Activity  . Alcohol use: No  . Drug  use: No  . Sexual activity: Not on file  Lifestyle  . Physical activity    Days per week: Not on file    Minutes per session: Not on file  . Stress: Not on file  Relationships  . Social Herbalist on phone: Not on file    Gets together: Not on file    Attends religious service: Not on file    Active member of club or organization: Not on file    Attends meetings of clubs or organizations: Not on file    Relationship status: Not on file  Other Topics Concern  . 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 albuterol (PROVENTIL HFA;VENTOLIN HFA) 108 (90 Base) MCG/ACT inhaler Inhale 2 puffs into the lungs every 6 (six) hours as needed for wheezing or shortness of breath.  Marland Kitchen albuterol (PROVENTIL) (2.5 MG/3ML) 0.083% nebulizer solution Take 3 mLs (2.5 mg total) by nebulization every 6 (six) hours as needed for wheezing or shortness of breath.  . lisinopril (PRINIVIL,ZESTRIL) 5 MG tablet Take 5 mg by mouth daily.  Marland Kitchen loratadine (CLARITIN) 10 MG tablet Take 1 tablet (10 mg total) by mouth daily.  . Multiple Vitamin (MULTIVITAMIN) tablet Take 1 tablet by mouth daily.  . [DISCONTINUED] ondansetron (ZOFRAN-ODT) 4 MG disintegrating tablet Take 1 tablet (4 mg total) by mouth every 8 (eight) hours as needed for nausea or vomiting.  . [DISCONTINUED] traMADol (ULTRAM) 50 MG tablet Take 1 tablet (50 mg total) by mouth every 6 (six) hours as needed.   No facility-administered encounter medications on file as of 09/10/2018.     Activities of Daily Living In your present state of health, do you have any difficulty performing the following activities: 09/10/2018  Hearing? N  Vision? N  Difficulty concentrating or making decisions? N  Walking or climbing stairs? Y  Comment Cane in use  Dressing or bathing? Y  Comment Wife assists  Doing errands, shopping? Y  Comment Drives short distances with a family member  Conservation officer, nature and eating ? Y  Comment Wife and son prepare the meals. Self feeds.  Using the Toilet? N  In the past six months, have you accidently leaked urine? N  Do you have problems with loss of bowel control? N  Managing your Medications? Y  Comment Wife manages  Managing your Finances? Y  Comment Wife manages  Housekeeping or managing your Housekeeping? Y  Comment Wife manages  Some recent data might be hidden    Patient Care Team: Leone Haven, MD as PCP - General (Family Medicine)   Assessment:   This is a routine wellness examination for James [name].  I  connected with patient 09/10/18 at 11:00 AM EDT by a video/audio enabled telemedicine application and verified that I am speaking with the correct person using two identifiers. Patient stated full name and DOB. Patient gave permission to continue with virtual visit. Patient's location was at home and Nurse's location was at [place] office.  Nebulizer- notes he has been using the treatment more frequently than usual. Sats have been running high 80s to low 90s. Inhalers in use prn.  Cardiology- last visit 06/2018  Keep all routine maintenance appointments. Virtual follow up scheduled 09/12/18.  Wife plans to submit vital signs at this time.   Health Screenings  Colonoscopy - 03/2017 Glaucoma -none Hearing -demonstrates normal hearing during visit. Labs followed by pcp Dental- visits every 6 months Vision- visits within the last 12 months.  Social  Alcohol intake - no          Smoking history- never    Smokers in home? none Illicit drug use? none Exercise -walks back and forth inside the home Diet - regular; low carb encouraged Sexually Active -not currently BMI- discussed the importance of a healthy diet, water intake and the benefits of aerobic exercise.  Educational material provided.   Safety  Patient feels safe at home- yes Patient does have smoke detectors at home- yes Patient does wear sunscreen or protective clothing when in direct sunlight -yes Patient does wear seat belt when in a moving vehicle -yes Patient drives- yes, minimal Shower rails- yes No stairs in house; one story Keeps cell phone on him when alone- yes  Covid-19 precautions and sickness symptoms discussed.   Activities of Daily Living Patient denies needing assistance with: driving, self feeding themselves, getting from bed to chair, getting to the toilet. Wife manages the cooking, cleaning, home and Doctor, hospital.   Depression Screen Notes he feels about the same as always and reports no change.    Medication-taking as directed and without issues.   Fall Screen Reports last fall about 3 months ago; cane in use when ambulating. Power wheelchair has been ordered via wife.    Memory Screen Patient is alert.  Correctly identified the president of the [place], season and months in reverse.  Patient likes to play brain games on the phone and ipad for brain stimulation.  Immunizations The following Immunizations were discussed: Influenza, shingles, pneumonia, and tetanus.   Other Providers Patient Care Team: Leone Haven, MD as PCP - General (Family Medicine)  Exercise Activities and Dietary recommendations Current Exercise Habits: Home exercise routine, Type of exercise: walking, Time (Minutes): 10, Intensity: Mild  Goals      Patient Stated   . Follow up with Primary Care Provider (pt-stated)     As needed       Fall Risk Fall Risk  09/10/2018 05/19/2016  Falls in the past year? 1 Yes  Number falls in past yr: 1 1  Injury with Fall? 0 Yes  Risk for fall due to : History of fall(s);Impaired balance/gait History of fall(s);Impaired balance/gait  Follow up - Falls prevention discussed   Depression Screen PHQ 2/9 Scores 09/10/2018 05/19/2016  PHQ - 2 Score 0 0    Cognitive Function MMSE - Mini Mental State Exam 05/19/2016  Orientation to time 5  Orientation to Place 5  Registration 3  Attention/ Calculation 5  Recall 3  Language- name 2 objects 2  Language- repeat 1  Language- follow 3 step command 3  Language- read & follow direction 1  Write a sentence 1  Copy design 1  Total score 30     6CIT Screen 09/10/2018  What Year? 0 points  What month? 0 points  What time? 0 points  Count back from 20 0 points  Months in reverse 0 points    Immunization History  Administered Date(s) Administered  . Influenza, Seasonal, Injecte, Preservative Fre 01/18/2013, 01/10/2014  . Influenza,inj,Quad PF,6+ Mos 01/16/2015, 03/01/2016, 12/29/2017  . Influenza-Unspecified  01/20/2012, 01/10/2014, 01/25/2015, 12/22/2016, 12/22/2016  Screening Tests Health Maintenance  Topic Date Due  . HIV Screening  01/18/1981  . TETANUS/TDAP  01/18/1985  . INFLUENZA VACCINE  10/27/2018  . COLONOSCOPY  04/04/2020       Plan:   End of life planning; Advance aging; Advanced directives discussed.  Copy of current HCPOA/Living Will requested upon completion.     I have personally reviewed and noted the following in the patient's chart:   . Medical and social history . Use of alcohol, tobacco or illicit drugs  . Current medications and supplements . Functional ability and status . Nutritional status . Physical activity . Advanced directives . List of other physicians . Hospitalizations, surgeries, and ER visits in previous 12 months . Vitals . Screenings to include cognitive, depression, and falls . Referrals and appointments  In addition, I have reviewed and discussed with patient certain preventive protocols, quality metrics, and best practice recommendations. A written personalized care plan for preventive services as well as general preventive health recommendations were provided to patient.     Varney Biles, LPN  [phone]

## 2018-09-10 NOTE — Patient Instructions (Addendum)
  [name] , Thank you for taking time to come for your Medicare Wellness Visit. I appreciate your ongoing commitment to your health goals. Please review the following plan we discussed and let me know if I can assist you in the future.   These are the goals we discussed: Goals      Patient Stated   . Follow up with Primary Care Provider (pt-stated)     As needed       This is a list of the screening recommended for you and due dates:  Health Maintenance  Topic Date Due  . HIV Screening  01/18/1981  . Tetanus Vaccine  01/18/1985  . Flu Shot  10/27/2018  . Colon Cancer Screening  04/04/2020

## 2018-09-12 ENCOUNTER — Ambulatory Visit: Payer: Medicare HMO | Admitting: Family Medicine

## 2018-09-12 DIAGNOSIS — R001 Bradycardia, unspecified: Secondary | ICD-10-CM | POA: Diagnosis not present

## 2018-09-14 DIAGNOSIS — I442 Atrioventricular block, complete: Secondary | ICD-10-CM | POA: Diagnosis not present

## 2018-09-14 DIAGNOSIS — I447 Left bundle-branch block, unspecified: Secondary | ICD-10-CM | POA: Diagnosis not present

## 2018-09-14 DIAGNOSIS — Z95810 Presence of automatic (implantable) cardiac defibrillator: Secondary | ICD-10-CM | POA: Diagnosis not present

## 2018-09-18 DIAGNOSIS — Z95810 Presence of automatic (implantable) cardiac defibrillator: Secondary | ICD-10-CM | POA: Diagnosis not present

## 2018-09-18 DIAGNOSIS — R0600 Dyspnea, unspecified: Secondary | ICD-10-CM | POA: Diagnosis not present

## 2018-09-18 DIAGNOSIS — I480 Paroxysmal atrial fibrillation: Secondary | ICD-10-CM | POA: Diagnosis not present

## 2018-09-18 DIAGNOSIS — I428 Other cardiomyopathies: Secondary | ICD-10-CM | POA: Diagnosis not present

## 2018-09-18 DIAGNOSIS — R002 Palpitations: Secondary | ICD-10-CM | POA: Diagnosis not present

## 2018-09-18 DIAGNOSIS — R9431 Abnormal electrocardiogram [ECG] [EKG]: Secondary | ICD-10-CM | POA: Diagnosis not present

## 2018-09-18 DIAGNOSIS — G7111 Myotonic muscular dystrophy: Secondary | ICD-10-CM | POA: Diagnosis not present

## 2018-09-18 DIAGNOSIS — I447 Left bundle-branch block, unspecified: Secondary | ICD-10-CM | POA: Diagnosis not present

## 2018-09-20 NOTE — Progress Notes (Signed)
I have reviewed the above note and agree.  It appears he canceled his Doxy follow-up visits.  Please contact him to get him rescheduled particularly in light of the increased nebulizer use.  Please see if there is a particular reason other than his low oxygen sat he was using the nebulizers.  Is he having any symptoms such as cough, shortness of breath, wheezing, fevers, or other symptoms?  [name], M.D.

## 2018-09-24 ENCOUNTER — Ambulatory Visit: Payer: Medicare HMO | Admitting: Family Medicine

## 2018-09-25 DIAGNOSIS — G7111 Myotonic muscular dystrophy: Secondary | ICD-10-CM | POA: Diagnosis not present

## 2018-10-15 DIAGNOSIS — Z95810 Presence of automatic (implantable) cardiac defibrillator: Secondary | ICD-10-CM | POA: Diagnosis not present

## 2018-10-15 DIAGNOSIS — Z4502 Encounter for adjustment and management of automatic implantable cardiac defibrillator: Secondary | ICD-10-CM | POA: Diagnosis not present

## 2018-10-18 DIAGNOSIS — E875 Hyperkalemia: Secondary | ICD-10-CM | POA: Diagnosis not present

## 2018-11-02 DIAGNOSIS — R7302 Impaired glucose tolerance (oral): Secondary | ICD-10-CM | POA: Diagnosis not present

## 2018-11-02 DIAGNOSIS — G7111 Myotonic muscular dystrophy: Secondary | ICD-10-CM | POA: Diagnosis not present

## 2018-11-19 DIAGNOSIS — I428 Other cardiomyopathies: Secondary | ICD-10-CM | POA: Diagnosis not present

## 2018-11-19 DIAGNOSIS — I447 Left bundle-branch block, unspecified: Secondary | ICD-10-CM | POA: Diagnosis not present

## 2018-11-19 DIAGNOSIS — I480 Paroxysmal atrial fibrillation: Secondary | ICD-10-CM | POA: Diagnosis not present

## 2018-11-20 ENCOUNTER — Other Ambulatory Visit: Payer: Self-pay

## 2018-11-20 ENCOUNTER — Encounter: Payer: Self-pay | Admitting: Family Medicine

## 2018-11-20 ENCOUNTER — Ambulatory Visit (INDEPENDENT_AMBULATORY_CARE_PROVIDER_SITE_OTHER): Payer: Medicare HMO | Admitting: Family Medicine

## 2018-11-20 DIAGNOSIS — G4733 Obstructive sleep apnea (adult) (pediatric): Secondary | ICD-10-CM | POA: Diagnosis not present

## 2018-11-20 DIAGNOSIS — G7111 Myotonic muscular dystrophy: Secondary | ICD-10-CM

## 2018-11-20 DIAGNOSIS — M87059 Idiopathic aseptic necrosis of unspecified femur: Secondary | ICD-10-CM

## 2018-11-20 DIAGNOSIS — I429 Cardiomyopathy, unspecified: Secondary | ICD-10-CM | POA: Diagnosis not present

## 2018-11-20 DIAGNOSIS — R1310 Dysphagia, unspecified: Secondary | ICD-10-CM | POA: Diagnosis not present

## 2018-11-20 NOTE — Assessment & Plan Note (Signed)
Some coughing with eating though no sensation of food sticking.  He will monitor.  We will continue to see neurology.

## 2018-11-20 NOTE — Progress Notes (Signed)
Virtual Visit via telephone Note  This visit type was conducted due to national recommendations for restrictions regarding the COVID-19 pandemic (e.g. social distancing).  This format is felt to be most appropriate for this patient at this time.  All issues noted in this document were discussed and addressed.  No physical exam was performed (except for noted visual exam findings with Video Visits).   I connected with James [name] today at  4:00 PM EDT by a video enabled telemedicine application or telephone and verified that I am speaking with the correct person using two identifiers. Location patient: home Location provider: work  Persons participating in the virtual visit: patient, provider  I discussed the limitations, risks, security and privacy concerns of performing an evaluation and management service by telephone and the availability of in person appointments. I also discussed with the patient that there may be a patient responsible charge related to this service. The patient expressed understanding and agreed to proceed.  Interactive audio and video telecommunications were attempted between this provider and patient, however failed, due to patient having technical difficulties OR patient did not have access to video capability.  We continued and completed visit with audio only.  Reason for visit: follow-up  HPI: Cardiomyopathy: Patient has been following with cardiology.  Blood pressure was low while back and his heart rates were down into the 40s.  They increased the rate on his pacer and he feels quite a bit better.  He has more energy.  No chest pain or shortness of breath.  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  Everything seemed to be stable and they recommended six-month follow-up.  His swallowing has been stable.  Occasional cough after he eats.  No dysphagia.  He has been walking some for exercise.    Bilateral hip pain: He has avascular necrosis of bilateral hips.   He notes there has been no significant change in his pain.  He did see orthopedics and notes they recommended conservative treatment unless his pain worsens significantly.  OSA: Wearing his CPAP nightly.  Wears it for 7 to 8 hours.  No hypersomnia.  He does wake well rested.   ROS: See pertinent positives and negatives per HPI.  Past Medical History:  Diagnosis Date  . Bronchitis   . Cardiomyopathy ([place])   . Dysphagia   . GERD (gastroesophageal reflux disease)   . History of chicken pox   . History of pneumonia 2013   double PNA at [facility]  . LBBB (left bundle branch block)   . Myotonic dystrophy, type 1 ([place])    followed by [facility]  . Pneumonia    aspiration  . Sleep apnea     Past Surgical History:  Procedure Laterality Date  . CARDIAC DEFIBRILLATOR PLACEMENT     defib/pacer  . CARDIAC DEFIBRILLATOR PLACEMENT    . COLONOSCOPY WITH PROPOFOL N/A 08/16/2016   Procedure: COLONOSCOPY WITH PROPOFOL;  Surgeon: Jonathon Bellows, MD;  Location: [facility] ENDOSCOPY;  Service: Gastroenterology;  Laterality: N/A;  . COLONOSCOPY WITH PROPOFOL N/A 04/04/2017   Procedure: COLONOSCOPY WITH PROPOFOL;  Surgeon: Jonathon Bellows, MD;  Location: [facility] ENDOSCOPY;  Service: Gastroenterology;  Laterality: N/A;  . PACEMAKER IMPLANT    . US ECHOCARDIOGRAPHY  09/2010   EF 50%, mld LVH and dysfunction, mild RV dysfunction and enlarged    Family History  Problem Relation Age of Onset  . Hypertension Father   . Cancer Paternal Grandfather        lung, smoker  . Cancer  Paternal Uncle        bone  . Diabetes Mother   . CAD Mother 17       MI  . Stroke Neg Hx     SOCIAL HX: Non-smoker.   Current Outpatient Medications:  .  albuterol (PROVENTIL HFA;VENTOLIN HFA) 108 (90 Base) MCG/ACT inhaler, Inhale 2 puffs into the lungs every 6 (six) hours as needed for wheezing or shortness of breath., Disp: 1 Inhaler, Rfl: 0 .  albuterol (PROVENTIL) (2.5 MG/3ML) 0.083% nebulizer solution, Take 3 mLs (2.5 mg total) by nebulization  every 6 (six) hours as needed for wheezing or shortness of breath., Disp: 150 mL, Rfl: 1 .  lisinopril (PRINIVIL,ZESTRIL) 5 MG tablet, Take 5 mg by mouth daily., Disp: , Rfl:  .  loratadine (CLARITIN) 10 MG tablet, Take 1 tablet (10 mg total) by mouth daily., Disp: , Rfl:  .  Multiple Vitamin (MULTIVITAMIN) tablet, Take 1 tablet by mouth daily., Disp: , Rfl:   EXAM: This was a telehealth telephone visit and thus no physical exam was completed.  ASSESSMENT AND PLAN:  Discussed the following assessment and plan:  Cardiomyopathy ([place]) Continues to follow with cardiology.  Has done better since he had his pacemaker rate increased.  He will continue to monitor.  He will continue to follow with cardiology.  Obstructive sleep apnea Seems to be doing well with CPAP therapy.  We will continue this.  Dysphagia Some coughing with eating though no sensation of food sticking.  He will monitor.  We will continue to see neurology.  Avascular necrosis of hip, unspecified laterality [facility]) He will monitor for any worsening pain and follow-up with orthopedics if he develops any worsening symptoms.  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  We will continue to follow with neurology.    Social distance and precautions and sick precautions given regarding COVID-19.  I discussed the assessment and treatment plan with the patient. The patient was provided an opportunity to ask questions and all were answered. The patient agreed with the plan and demonstrated an understanding of the instructions.   The patient was advised to call back or seek an in-person evaluation if the symptoms worsen or if the condition fails to improve as anticipated.  I provided 9 minutes of non-face-to-face time during this encounter.   [name], MD

## 2018-11-20 NOTE — Assessment & Plan Note (Signed)
He will monitor for any worsening pain and follow-up with orthopedics if he develops any worsening symptoms.

## 2018-11-20 NOTE — Assessment & Plan Note (Signed)
Continues to follow with cardiology.  Has done better since he had his pacemaker rate increased.  He will continue to monitor.  He will continue to follow with cardiology.

## 2018-11-20 NOTE — Assessment & Plan Note (Signed)
Seems to be doing well with CPAP therapy.  We will continue this.

## 2018-11-20 NOTE — Assessment & Plan Note (Signed)
Seems to be stable.  We will continue to follow with neurology.

## 2019-03-09 DIAGNOSIS — Z20828 Contact with and (suspected) exposure to other viral communicable diseases: Secondary | ICD-10-CM | POA: Diagnosis not present

## 2019-03-18 DIAGNOSIS — I428 Other cardiomyopathies: Secondary | ICD-10-CM | POA: Diagnosis not present

## 2019-03-18 DIAGNOSIS — Z95810 Presence of automatic (implantable) cardiac defibrillator: Secondary | ICD-10-CM | POA: Diagnosis not present

## 2019-03-18 DIAGNOSIS — G7111 Myotonic muscular dystrophy: Secondary | ICD-10-CM | POA: Diagnosis not present

## 2019-03-18 DIAGNOSIS — R002 Palpitations: Secondary | ICD-10-CM | POA: Diagnosis not present

## 2019-03-18 DIAGNOSIS — K219 Gastro-esophageal reflux disease without esophagitis: Secondary | ICD-10-CM | POA: Diagnosis not present

## 2019-03-18 DIAGNOSIS — R0600 Dyspnea, unspecified: Secondary | ICD-10-CM | POA: Diagnosis not present

## 2019-03-18 DIAGNOSIS — R9431 Abnormal electrocardiogram [ECG] [EKG]: Secondary | ICD-10-CM | POA: Diagnosis not present

## 2019-03-18 DIAGNOSIS — I447 Left bundle-branch block, unspecified: Secondary | ICD-10-CM | POA: Diagnosis not present

## 2019-03-18 DIAGNOSIS — I480 Paroxysmal atrial fibrillation: Secondary | ICD-10-CM | POA: Diagnosis not present

## 2019-04-09 DIAGNOSIS — G4733 Obstructive sleep apnea (adult) (pediatric): Secondary | ICD-10-CM | POA: Diagnosis not present

## 2019-04-30 DIAGNOSIS — Z95810 Presence of automatic (implantable) cardiac defibrillator: Secondary | ICD-10-CM | POA: Diagnosis not present

## 2019-05-24 ENCOUNTER — Other Ambulatory Visit: Payer: Self-pay

## 2019-05-24 ENCOUNTER — Ambulatory Visit (INDEPENDENT_AMBULATORY_CARE_PROVIDER_SITE_OTHER): Payer: Medicare HMO | Admitting: Family Medicine

## 2019-05-24 DIAGNOSIS — M87059 Idiopathic aseptic necrosis of unspecified femur: Secondary | ICD-10-CM

## 2019-05-24 DIAGNOSIS — I429 Cardiomyopathy, unspecified: Secondary | ICD-10-CM

## 2019-05-24 DIAGNOSIS — G4733 Obstructive sleep apnea (adult) (pediatric): Secondary | ICD-10-CM

## 2019-05-24 NOTE — Assessment & Plan Note (Signed)
Stable.  He will continue to see cardiology.  Continue lisinopril.  We will check a BMP.

## 2019-05-24 NOTE — Progress Notes (Signed)
Virtual Visit via telephone Note  This visit type was conducted due to national recommendations for restrictions regarding the COVID-19 pandemic (e.g. social distancing).  This format is felt to be most appropriate for this patient at this time.  All issues noted in this document were discussed and addressed.  No physical exam was performed (except for noted visual exam findings with Video Visits).   I connected with James [name] today at  3:30 PM EST by telephone and verified that I am speaking with the correct person using two identifiers. Location patient: home Location provider: work or home office Persons participating in the virtual visit: patient, provider  I discussed the limitations, risks, security and privacy concerns of performing an evaluation and management service by telephone and the availability of in person appointments. I also discussed with the patient that there may be a patient responsible charge related to this service. The patient expressed understanding and agreed to proceed.  Interactive audio and video telecommunications were attempted between this provider and patient, however failed, due to patient having technical difficulties OR patient did not have access to video capability.  We continued and completed visit with audio only.   Reason for visit: follow-up  HPI: OSA: Currently on BiPAP.  He uses this nightly.  Rare hypersomnia.  He does wake well rested.  Wears his BiPAP for 7 hours.  He follows with neurology for this.  Cardiomyopathy: Continues on lisinopril.  He continues to see cardiology.  He notes no orthopnea, PND, shortness of breath, edema, or chest pain.  Bilateral hip pain: Patient notes no pain.  He notes he saw orthopedics previously and they recommended conservative management and potentially shots if he developed worsening pain.   ROS: See pertinent positives and negatives per HPI.  Past Medical History:  Diagnosis Date  . Bronchitis   .  Cardiomyopathy ([place])   . Dysphagia   . GERD (gastroesophageal reflux disease)   . History of chicken pox   . History of pneumonia 2013   double PNA at [facility]  . LBBB (left bundle branch block)   . Myotonic dystrophy, type 1 ([place])    followed by [facility]  . Pneumonia    aspiration  . Sleep apnea     Past Surgical History:  Procedure Laterality Date  . CARDIAC DEFIBRILLATOR PLACEMENT     defib/pacer  . CARDIAC DEFIBRILLATOR PLACEMENT    . COLONOSCOPY WITH PROPOFOL N/A 08/16/2016   Procedure: COLONOSCOPY WITH PROPOFOL;  Surgeon: Jonathon Bellows, MD;  Location: [facility] ENDOSCOPY;  Service: Gastroenterology;  Laterality: N/A;  . COLONOSCOPY WITH PROPOFOL N/A 04/04/2017   Procedure: COLONOSCOPY WITH PROPOFOL;  Surgeon: Jonathon Bellows, MD;  Location: [facility] ENDOSCOPY;  Service: Gastroenterology;  Laterality: N/A;  . PACEMAKER IMPLANT    . US ECHOCARDIOGRAPHY  09/2010   EF 50%, mld LVH and dysfunction, mild RV dysfunction and enlarged    Family History  Problem Relation Age of Onset  . Hypertension Father   . Cancer Paternal Grandfather        lung, smoker  . Cancer Paternal Uncle        bone  . Diabetes Mother   . CAD Mother 30       MI  . Stroke Neg Hx     SOCIAL HX: Non-smoker   Current Outpatient Medications:  .  albuterol (PROVENTIL HFA;VENTOLIN HFA) 108 (90 Base) MCG/ACT inhaler, Inhale 2 puffs into the lungs every 6 (six) hours as needed for wheezing or shortness of breath., Disp:  1 Inhaler, Rfl: 0 .  albuterol (PROVENTIL) (2.5 MG/3ML) 0.083% nebulizer solution, Take 3 mLs (2.5 mg total) by nebulization every 6 (six) hours as needed for wheezing or shortness of breath., Disp: 150 mL, Rfl: 1 .  lisinopril (PRINIVIL,ZESTRIL) 5 MG tablet, Take 5 mg by mouth daily., Disp: , Rfl:  .  loratadine (CLARITIN) 10 MG tablet, Take 1 tablet (10 mg total) by mouth daily., Disp: , Rfl:  .  Multiple Vitamin (MULTIVITAMIN) tablet, Take 1 tablet by mouth daily., Disp: , Rfl:   EXAM: This is a telehealth  telephone visit and thus no physical exam was completed.  ASSESSMENT AND PLAN:  Discussed the following assessment and plan:  Cardiomyopathy ([place]) Stable.  He will continue to see cardiology.  Continue lisinopril.  We will check a BMP.  Obstructive sleep apnea Well-controlled.  Continue BiPAP.  Avascular necrosis of hip, unspecified laterality (HCC) No significant symptoms.  He will monitor.   Orders Placed This Encounter  Procedures  . Comp Met (CMET)    Standing Status:   Future    Standing Expiration Date:   05/23/2020  . Lipid panel    Standing Status:   Future    Standing Expiration Date:   05/23/2020    No orders of the defined types were placed in this encounter.    I discussed the assessment and treatment plan with the patient. The patient was provided an opportunity to ask questions and all were answered. The patient agreed with the plan and demonstrated an understanding of the instructions.   The patient was advised to call back or seek an in-person evaluation if the symptoms worsen or if the condition fails to improve as anticipated.  I provided 6 minutes of non-face-to-face time during this encounter.   [name], MD

## 2019-05-24 NOTE — Assessment & Plan Note (Signed)
Well-controlled.  Continue BiPAP.

## 2019-05-24 NOTE — Assessment & Plan Note (Signed)
No significant symptoms.  He will monitor.

## 2019-05-31 DIAGNOSIS — Z23 Encounter for immunization: Secondary | ICD-10-CM | POA: Diagnosis not present

## 2019-05-31 DIAGNOSIS — G7111 Myotonic muscular dystrophy: Secondary | ICD-10-CM | POA: Diagnosis not present

## 2019-06-13 ENCOUNTER — Other Ambulatory Visit: Payer: Self-pay

## 2019-06-13 ENCOUNTER — Other Ambulatory Visit (INDEPENDENT_AMBULATORY_CARE_PROVIDER_SITE_OTHER): Payer: Medicare HMO

## 2019-06-13 DIAGNOSIS — I429 Cardiomyopathy, unspecified: Secondary | ICD-10-CM

## 2019-06-13 LAB — LIPID PANEL
Cholesterol: 134 mg/dL (ref 0–200)
HDL: 38 mg/dL — ABNORMAL LOW (ref >39.00)
LDL Cholesterol: 76 mg/dL (ref 0–99)
NonHDL: 95.91
Total CHOL/HDL Ratio: 4
Triglycerides: 102 mg/dL (ref 0.0–149.0)
VLDL: 20.4 mg/dL (ref 0.0–40.0)

## 2019-06-13 LAB — COMPREHENSIVE METABOLIC PANEL
ALT: 29 U/L (ref 0–53)
AST: 24 U/L (ref 0–37)
Albumin: 4 g/dL (ref 3.5–5.2)
Alkaline Phosphatase: 72 U/L (ref 39–117)
BUN: 8 mg/dL (ref 6–23)
CO2: 29 mEq/L (ref 19–32)
Calcium: 9.2 mg/dL (ref 8.4–10.5)
Chloride: 109 mEq/L (ref 96–112)
Creatinine, Ser: 0.81 mg/dL (ref 0.40–1.50)
GFR: 99.53 mL/min (ref >60.00)
Glucose, Bld: 98 mg/dL (ref 70–99)
Potassium: 4 mEq/L (ref 3.5–5.1)
Sodium: 142 mEq/L (ref 135–145)
Total Bilirubin: 1.1 mg/dL (ref 0.2–1.2)
Total Protein: 7.2 g/dL (ref 6.0–8.3)

## 2019-06-14 ENCOUNTER — Other Ambulatory Visit: Payer: Medicare HMO

## 2019-07-11 DIAGNOSIS — G7111 Myotonic muscular dystrophy: Secondary | ICD-10-CM | POA: Diagnosis not present

## 2019-07-11 DIAGNOSIS — R1312 Dysphagia, oropharyngeal phase: Secondary | ICD-10-CM | POA: Diagnosis not present

## 2019-08-30 ENCOUNTER — Ambulatory Visit: Payer: Medicare HMO

## 2019-09-10 DIAGNOSIS — Z95810 Presence of automatic (implantable) cardiac defibrillator: Secondary | ICD-10-CM | POA: Diagnosis not present

## 2019-09-11 ENCOUNTER — Ambulatory Visit (INDEPENDENT_AMBULATORY_CARE_PROVIDER_SITE_OTHER): Payer: Medicare HMO

## 2019-09-11 VITALS — Ht 71.0 in | Wt 210.0 lb

## 2019-09-11 DIAGNOSIS — Z0000 Encounter for general adult medical examination without abnormal findings: Secondary | ICD-10-CM | POA: Diagnosis not present

## 2019-09-11 DIAGNOSIS — Z95810 Presence of automatic (implantable) cardiac defibrillator: Secondary | ICD-10-CM | POA: Diagnosis not present

## 2019-09-11 NOTE — Progress Notes (Signed)
I have reviewed the above note and agree.  Jakota Manthei, M.D.  

## 2019-09-11 NOTE — Progress Notes (Signed)
Subjective:   James [name] is a 54 y.o. male who presents for Medicare Annual/Subsequent preventive examination.  Review of Systems:  No ROS.  Medicare Wellness Virtual Visit.   Cardiac Risk Factors include: advanced age (>65men, >24 women);male gender     Objective:    Vitals: Ht 5\' 11"  (1.803 m)   Wt 210 lb (95.3 kg)   BMI 29.29 kg/m   Body mass index is 29.29 kg/m.  Advanced Directives 09/11/2019 09/10/2018 11/01/2017 10/23/2017 04/04/2017 03/27/2017 08/16/2016  Does Patient Have a Medical Advance Directive? No No No No No No No  Would patient like information on creating a medical advance directive? No - Patient declined Yes (MAU/Ambulatory/Procedural Areas - Information given) No - Patient declined No - Patient declined - - No - Patient declined    Tobacco Social History   Tobacco Use  Smoking Status Never Smoker  Smokeless Tobacco Never Used     Counseling given: Not Answered   Clinical Intake:  Pre-visit preparation completed: Yes        Diabetes: No  How often do you need to have someone help you when you read instructions, pamphlets, or other written materials from your doctor or pharmacy?: 1 - Never  Interpreter Needed?: No     Past Medical History:  Diagnosis Date  . Bronchitis   . Cardiomyopathy ([place])   . Dysphagia   . GERD (gastroesophageal reflux disease)   . History of chicken pox   . History of pneumonia 2013   double PNA at [facility]  . LBBB (left bundle branch block)   . Myotonic dystrophy, type 1 ([place])    followed by [facility]  . Pneumonia    aspiration  . Sleep apnea    Past Surgical History:  Procedure Laterality Date  . CARDIAC DEFIBRILLATOR PLACEMENT     defib/pacer  . CARDIAC DEFIBRILLATOR PLACEMENT    . COLONOSCOPY WITH PROPOFOL N/A 08/16/2016   Procedure: COLONOSCOPY WITH PROPOFOL;  Surgeon: Jonathon Bellows, MD;  Location: [facility] ENDOSCOPY;  Service: Gastroenterology;  Laterality: N/A;  . COLONOSCOPY WITH PROPOFOL N/A 04/04/2017   Procedure:  COLONOSCOPY WITH PROPOFOL;  Surgeon: Jonathon Bellows, MD;  Location: [facility] ENDOSCOPY;  Service: Gastroenterology;  Laterality: N/A;  . PACEMAKER IMPLANT    . US ECHOCARDIOGRAPHY  09/2010   EF 50%, mld LVH and dysfunction, mild RV dysfunction and enlarged   Family History  Problem Relation Age of Onset  . Hypertension Father   . Cancer Paternal Grandfather        lung, smoker  . Cancer Paternal Uncle        bone  . Diabetes Mother   . CAD Mother 85       MI  . Stroke Neg Hx    Social History   Socioeconomic History  . Marital status: Married    Spouse name: Not on file  . Number of children: Not on file  . Years of education: Not on file  . Highest education level: Not on file  Occupational History  . Not on file  Tobacco Use  . Smoking status: Never Smoker  . Smokeless tobacco: Never Used  Vaping Use  . Vaping Use: Never used  Substance and Sexual Activity  . Alcohol use: No  . Drug use: No  . Sexual activity: Not on file  Other Topics Concern  . 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 Difficulty of Paying Living Expenses:   Food Insecurity:   . Worried About Charity fundraiser in the Last Year:   . Arboriculturist in the Last Year:   Transportation Needs:   . Film/video editor (Medical):   Marland Kitchen Lack of Transportation (Non-Medical):   Physical Activity:   . Days of Exercise per Week:   . Minutes of Exercise per Session:   Stress:   . Feeling of Stress :   Social Connections:   . Frequency of Communication with Friends and Family:   . Frequency of Social Gatherings with Friends and Family:   . Attends Religious Services:   . Active Member of Clubs or Organizations:   . Attends English as a second language teacher Meetings:   Marland Kitchen Marital Status:     Outpatient Encounter Medications as of 09/11/2019  Medication Sig  . albuterol (PROVENTIL HFA;VENTOLIN HFA) 108 (90 Base) MCG/ACT inhaler Inhale 2 puffs into the lungs every 6 (six) hours as needed for wheezing or shortness of breath.  Marland Kitchen albuterol (PROVENTIL) (2.5 MG/3ML) 0.083% nebulizer solution Take 3 mLs (2.5 mg total) by nebulization every 6 (six) hours as needed for wheezing or shortness of breath.  . lisinopril (PRINIVIL,ZESTRIL) 5 MG tablet Take 5 mg by mouth daily.  Marland Kitchen loratadine (CLARITIN) 10 MG tablet Take 1 tablet (10 mg total) by mouth daily.  . Multiple Vitamin (MULTIVITAMIN) tablet Take 1 tablet by mouth daily.   No facility-administered encounter medications on file as of 09/11/2019.    Activities of Daily Living In your present state of health, do you have any difficulty performing the following activities: 09/11/2019  Hearing? N  Vision? N  Difficulty concentrating or making decisions? N  Walking or climbing stairs? Y  Comment Unstaedy gait, cane in use  Dressing or bathing? N  Doing errands, shopping? N  Preparing Food and eating ? Y  Comment Wife assists with meal prep. Self feeds.  Using the Toilet? N  In the past six months, have you accidently leaked urine? N  Do you have problems with loss of bowel control? N  Managing your Medications? N  Managing your Finances? Y  Comment Wife manages  Housekeeping or managing your Housekeeping? Y  Comment Wife assists  Some recent data might be hidden    Patient Care Team: Leone Haven, MD as PCP - General (Family Medicine)   Assessment:   This is a routine wellness examination for [place].  I connected with [place] today by telephone and verified that I am speaking with the correct person using two identifiers. Location patient: home Location provider: work Persons participating in the virtual visit: patient, Marine scientist.    I discussed the limitations, risks,  security and privacy concerns of performing an evaluation and management service by telephone and the availability of in person appointments. The patient expressed understanding and verbally consented to this telephonic visit.    Interactive audio and video telecommunications were attempted between this provider and patient, however failed, due to patient having technical difficulties OR patient did not have access to video capability.  We continued and completed visit with audio only.  Some vital signs may be absent or patient reported.   Health Maintenance Due: -HIV- labs followed by  pcp  -Tdap vaccine- discussed; to be completed with doctor in visit or local pharmacy.   -Shingles vaccine- discussed; to be completed with doctor in visit or local pharmacy.  -Covid vaccine- series completed. Agrees to update immunization record at next office visit.  -Hep C Screening- consent given  See completed HM at the end of note.   Eye: Visual acuity not assessed. Virtual visit. Followed by their ophthalmologist.  Retinopathy- none reported.  Dental: UTD   Hearing: Demonstrates normal hearing during visit.  Safety:  Patient feels safe at home- yes Patient does have smoke detectors at home- yes Patient does wear sunscreen or protective clothing when in direct sunlight - yes Patient does wear seat belt when in a moving vehicle - yes Patient drives- yes Adequate lighting in walkways free from debris- yes Grab bars and handrails used as appropriate- yes Ambulates with an assistive device- yes; cane  Social: Alcohol intake - no       Smoking history- never   Smokers in home? none Illicit drug use? none  Medication: Taking as directed and without issues.  Self managed - yes   Covid-19: Precautions and sickness symptoms discussed. Wears mask, social distancing, hand hygiene as appropriate.   Activities of Daily Living Patient denies needing assistance with: feeding themselves, getting  from bed to chair, getting to the toilet, bathing/showering, dressing.  Assisted by wife with household chores, meal prep and managing finances.    Discussed the importance of a healthy diet, water intake and the benefits of aerobic exercise.   Physical activity- walking around the home  Diet:  Regular. Encouraged low carb.  Water: fair intake. Encouraged to stay hydrated.   Other Providers Patient Care Team: Leone Haven, MD as PCP - General (Family Medicine) Exercise Activities and Dietary recommendations Current Exercise Habits: Home exercise routine, Type of exercise: walking, Intensity: Mild  Goals      Patient Stated   .  Follow up with Primary Care Provider (pt-stated)      As needed       Fall Risk Fall Risk  09/11/2019 05/24/2019 11/20/2018 09/10/2018 05/19/2016  Falls in the past year? 0 1 1 1  Yes  Number falls in past yr: 0 0 0 1 1  Injury with Fall? 0 0 0 0 Yes  Risk for fall due to : - - - History of fall(s);Impaired balance/gait History of fall(s);Impaired balance/gait  Follow up Falls evaluation completed Falls evaluation completed Falls evaluation completed - Falls prevention discussed   Is the patient's home free of loose throw rugs in walkways, pet beds, electrical cords, etc?   Yes      Grab bars in the bathroom? Yes      Handrails on the stairs?  Yes      Adequate lighting?  Yes  Timed Get Up and Go Performed: No, virtual visit  Depression Screen PHQ 2/9 Scores 09/11/2019 05/24/2019 11/20/2018 09/10/2018  PHQ - 2 Score 0 0 0 0    Cognitive Function Patient is alert and oriented x3. Patient denies difficulty focusing or concentrating.  MMSE - Mini Mental State Exam 05/19/2016  Orientation to time 5  Orientation to Place 5  Registration 3  Attention/ Calculation 5  Recall 3  Language- name 2 objects 2  Language- repeat 1  Language- follow 3 step command 3  Language- read & follow direction 1  Write a sentence 1  Copy design 1  Total score 30       6CIT Screen  09/11/2019 09/10/2018  What Year? 0 points 0 points  What month? 0 points 0 points  What time? - 0 points  Count back from 20 2 points 0 points  Months in reverse 0 points 0 points  Repeat phrase 0 points -    Immunization History  Administered Date(s) Administered  . Influenza, Seasonal, Injecte, Preservative Fre 01/18/2013, 01/10/2014  . Influenza,inj,Quad PF,6+ Mos 01/16/2015, 03/01/2016, 12/29/2017  . Influenza-Unspecified 01/20/2012, 01/10/2014, 01/25/2015, 12/22/2016, 12/15/2018   Screening Tests Health Maintenance  Topic Date Due  . Hepatitis C Screening  Never done  . COVID-19 Vaccine (1) Never done  . HIV Screening  Never done  . TETANUS/TDAP  Never done  . INFLUENZA VACCINE  10/27/2019  . COLONOSCOPY  04/04/2020       Plan:   Keep all routine maintenance appointments.   Follow up 11/22/19 @ 1:15  Hep C screening consent give; add to next lab.   Medicare Attestation I have personally reviewed: The patient's medical and social history Their use of alcohol, tobacco or illicit drugs Their current medications and supplements The patient's functional ability including ADLs,fall risks, home safety risks, cognitive, and hearing and visual impairment Diet and physical activities Evidence for depression   I have reviewed and discussed with patient certain preventive protocols, quality metrics, and best practice recommendations.      Varney Biles, LPN  7/47/1595

## 2019-09-11 NOTE — Patient Instructions (Addendum)
°  James [name] , Thank you for taking time to come for your Medicare Wellness Visit. I appreciate your ongoing commitment to your health goals. Please review the following plan we discussed and let me know if I can assist you in the future.   These are the goals we discussed: Goals      Patient Stated     Follow up with Primary Care Provider (pt-stated)      As needed       This is a list of the screening recommended for you and due dates:  Health Maintenance  Topic Date Due    Hepatitis C: One time screening is recommended by Center for Disease Control  (CDC) for  adults born from [birth year] through 1965.   Never done   COVID-19 Vaccine (1) Never done   HIV Screening  Never done   Tetanus Vaccine  Never done   Flu Shot  10/27/2019   Colon Cancer Screening  04/04/2020

## 2019-10-13 ENCOUNTER — Other Ambulatory Visit: Payer: Self-pay

## 2019-10-13 ENCOUNTER — Inpatient Hospital Stay
Admission: EM | Admit: 2019-10-13 | Discharge: 2019-10-16 | DRG: 389 | Disposition: A | Discharge status: Home / Self Care | Payer: Medicare HMO | Attending: Family Medicine | Admitting: Family Medicine

## 2019-10-13 ENCOUNTER — Emergency Department: Payer: Medicare HMO

## 2019-10-13 ENCOUNTER — Encounter: Payer: Self-pay | Admitting: Radiology

## 2019-10-13 DIAGNOSIS — G4733 Obstructive sleep apnea (adult) (pediatric): Secondary | ICD-10-CM | POA: Diagnosis not present

## 2019-10-13 DIAGNOSIS — K56609 Unspecified intestinal obstruction, unspecified as to partial versus complete obstruction: Secondary | ICD-10-CM | POA: Diagnosis present

## 2019-10-13 DIAGNOSIS — Z8249 Family history of ischemic heart disease and other diseases of the circulatory system: Secondary | ICD-10-CM

## 2019-10-13 DIAGNOSIS — K56600 Partial intestinal obstruction, unspecified as to cause: Secondary | ICD-10-CM | POA: Diagnosis not present

## 2019-10-13 DIAGNOSIS — I10 Essential (primary) hypertension: Secondary | ICD-10-CM | POA: Diagnosis not present

## 2019-10-13 DIAGNOSIS — Z833 Family history of diabetes mellitus: Secondary | ICD-10-CM | POA: Diagnosis not present

## 2019-10-13 DIAGNOSIS — I429 Cardiomyopathy, unspecified: Secondary | ICD-10-CM

## 2019-10-13 DIAGNOSIS — M879 Osteonecrosis, unspecified: Secondary | ICD-10-CM | POA: Diagnosis not present

## 2019-10-13 DIAGNOSIS — R110 Nausea: Secondary | ICD-10-CM

## 2019-10-13 DIAGNOSIS — M87059 Idiopathic aseptic necrosis of unspecified femur: Secondary | ICD-10-CM | POA: Diagnosis not present

## 2019-10-13 DIAGNOSIS — R9431 Abnormal electrocardiogram [ECG] [EKG]: Secondary | ICD-10-CM | POA: Diagnosis not present

## 2019-10-13 DIAGNOSIS — K8020 Calculus of gallbladder without cholecystitis without obstruction: Secondary | ICD-10-CM | POA: Diagnosis not present

## 2019-10-13 DIAGNOSIS — K529 Noninfective gastroenteritis and colitis, unspecified: Secondary | ICD-10-CM | POA: Diagnosis present

## 2019-10-13 DIAGNOSIS — K760 Fatty (change of) liver, not elsewhere classified: Secondary | ICD-10-CM | POA: Diagnosis not present

## 2019-10-13 DIAGNOSIS — K6389 Other specified diseases of intestine: Secondary | ICD-10-CM | POA: Diagnosis not present

## 2019-10-13 DIAGNOSIS — Z95810 Presence of automatic (implantable) cardiac defibrillator: Secondary | ICD-10-CM | POA: Diagnosis not present

## 2019-10-13 DIAGNOSIS — K219 Gastro-esophageal reflux disease without esophagitis: Secondary | ICD-10-CM | POA: Diagnosis present

## 2019-10-13 DIAGNOSIS — G7111 Myotonic muscular dystrophy: Secondary | ICD-10-CM | POA: Diagnosis present

## 2019-10-13 DIAGNOSIS — Z20822 Contact with and (suspected) exposure to covid-19: Secondary | ICD-10-CM | POA: Diagnosis present

## 2019-10-13 DIAGNOSIS — K567 Ileus, unspecified: Secondary | ICD-10-CM | POA: Diagnosis not present

## 2019-10-13 DIAGNOSIS — N3289 Other specified disorders of bladder: Secondary | ICD-10-CM | POA: Diagnosis not present

## 2019-10-13 DIAGNOSIS — Z79899 Other long term (current) drug therapy: Secondary | ICD-10-CM | POA: Diagnosis not present

## 2019-10-13 DIAGNOSIS — R1084 Generalized abdominal pain: Secondary | ICD-10-CM | POA: Diagnosis not present

## 2019-10-13 LAB — COMPREHENSIVE METABOLIC PANEL
ALT: 26 U/L (ref 0–44)
AST: 19 U/L (ref 15–41)
Albumin: 3.8 g/dL (ref 3.5–5.0)
Alkaline Phosphatase: 72 U/L (ref 38–126)
Anion gap: 9 (ref 5–15)
BUN: 9 mg/dL (ref 6–20)
CO2: 24 mmol/L (ref 22–32)
Calcium: 8.9 mg/dL (ref 8.9–10.3)
Chloride: 109 mmol/L (ref 98–111)
Creatinine, Ser: 0.78 mg/dL (ref 0.61–1.24)
GFR calc Af Amer: >60 mL/min (ref >60)
GFR calc non Af Amer: >60 mL/min (ref >60)
Glucose, Bld: 123 mg/dL — ABNORMAL HIGH (ref 70–99)
Potassium: 3.8 mmol/L (ref 3.5–5.1)
Sodium: 142 mmol/L (ref 135–145)
Total Bilirubin: 1.8 mg/dL — ABNORMAL HIGH (ref 0.3–1.2)
Total Protein: 7.3 g/dL (ref 6.5–8.1)

## 2019-10-13 LAB — CBC
HCT: 49 % (ref 39.0–52.0)
Hemoglobin: 16.1 g/dL (ref 13.0–17.0)
MCH: 31.7 pg (ref 26.0–34.0)
MCHC: 32.9 g/dL (ref 30.0–36.0)
MCV: 96.5 fL (ref 80.0–100.0)
Platelets: 194 10*3/uL (ref 150–400)
RBC: 5.08 MIL/uL (ref 4.22–5.81)
RDW: 13.2 % (ref 11.5–15.5)
WBC: 10.4 10*3/uL (ref 4.0–10.5)
nRBC: 0 % (ref 0.0–0.2)

## 2019-10-13 LAB — LIPASE, BLOOD: Lipase: 24 U/L (ref 11–51)

## 2019-10-13 LAB — SARS CORONAVIRUS 2 BY RT PCR (HOSPITAL ORDER, PERFORMED IN ~~LOC~~ HOSPITAL LAB): SARS Coronavirus 2: NEGATIVE

## 2019-10-13 MED ORDER — LISINOPRIL 10 MG PO TABS
5.0000 mg | ORAL_TABLET | Freq: Every day | ORAL | Status: DC
  Filled 2019-10-13: qty 1

## 2019-10-13 MED ORDER — ACETAMINOPHEN 325 MG PO TABS: 650.0000 mg | ORAL_TABLET | Freq: Four times a day (QID) | ORAL | Status: DC | PRN

## 2019-10-13 MED ORDER — IOHEXOL 300 MG/ML  SOLN
100.0000 mL | Freq: Once | INTRAMUSCULAR | Status: AC | PRN
  Administered 2019-10-13: 100 mL via INTRAVENOUS

## 2019-10-13 MED ORDER — ONDANSETRON HCL 4 MG PO TABS: 4.0000 mg | ORAL_TABLET | Freq: Four times a day (QID) | ORAL | Status: DC | PRN

## 2019-10-13 MED ORDER — ENOXAPARIN SODIUM 40 MG/0.4ML ~~LOC~~ SOLN
40.0000 mg | SUBCUTANEOUS | Status: DC
  Administered 2019-10-13 – 2019-10-15 (×3): 40 mg via SUBCUTANEOUS
  Filled 2019-10-13 (×3): qty 0.4

## 2019-10-13 MED ORDER — ONDANSETRON HCL 4 MG/2ML IJ SOLN: 4.0000 mg | Freq: Four times a day (QID) | INTRAMUSCULAR | Status: DC | PRN

## 2019-10-13 MED ORDER — MAGNESIUM HYDROXIDE 400 MG/5ML PO SUSP: 30.0000 mL | Freq: Every day | ORAL | Status: DC | PRN

## 2019-10-13 MED ORDER — LORATADINE 10 MG PO TABS
10.0000 mg | ORAL_TABLET | Freq: Every day | ORAL | Status: DC
  Administered 2019-10-15 – 2019-10-16 (×2): 10 mg via ORAL
  Filled 2019-10-13 (×2): qty 1

## 2019-10-13 MED ORDER — ALBUTEROL SULFATE (2.5 MG/3ML) 0.083% IN NEBU
2.5000 mg | INHALATION_SOLUTION | Freq: Four times a day (QID) | RESPIRATORY_TRACT | Status: DC | PRN
Start: 2019-10-13 — End: 2019-10-16

## 2019-10-13 MED ORDER — SODIUM CHLORIDE 0.9 % IV BOLUS
1000.0000 mL | Freq: Once | INTRAVENOUS | Status: AC
  Administered 2019-10-13: 1000 mL via INTRAVENOUS

## 2019-10-13 MED ORDER — ONDANSETRON HCL 4 MG/2ML IJ SOLN
4.0000 mg | Freq: Once | INTRAMUSCULAR | Status: AC
  Administered 2019-10-13: 4 mg via INTRAVENOUS
  Filled 2019-10-13: qty 2

## 2019-10-13 MED ORDER — SODIUM CHLORIDE 0.9 % IV SOLN: INTRAVENOUS | Status: DC

## 2019-10-13 MED ORDER — MORPHINE SULFATE (PF) 2 MG/ML IV SOLN: 2.0000 mg | INTRAVENOUS | Status: DC | PRN

## 2019-10-13 MED ORDER — KETOROLAC TROMETHAMINE 15 MG/ML IJ SOLN
15.0000 mg | Freq: Four times a day (QID) | INTRAMUSCULAR | Status: DC | PRN
  Administered 2019-10-14 (×2): 15 mg via INTRAVENOUS
  Filled 2019-10-13 (×3): qty 1

## 2019-10-13 MED ORDER — TRAZODONE HCL 50 MG PO TABS: 25.0000 mg | ORAL_TABLET | Freq: Every evening | ORAL | Status: DC | PRN

## 2019-10-13 MED ORDER — ADULT MULTIVITAMIN W/MINERALS CH
1.0000 | ORAL_TABLET | Freq: Every day | ORAL | Status: DC
  Administered 2019-10-15 – 2019-10-16 (×2): 1 via ORAL
  Filled 2019-10-13 (×2): qty 1

## 2019-10-13 MED ORDER — ALBUTEROL SULFATE HFA 108 (90 BASE) MCG/ACT IN AERS: 2.0000 | INHALATION_SPRAY | Freq: Four times a day (QID) | RESPIRATORY_TRACT | Status: DC | PRN

## 2019-10-13 MED ORDER — ACETAMINOPHEN 650 MG RE SUPP: 650.0000 mg | Freq: Four times a day (QID) | RECTAL | Status: DC | PRN

## 2019-10-13 NOTE — ED Triage Notes (Signed)
Pt states that he is having centralized abd pain with nausea, denies vomiting or radiation of the pain.denies diff with using the bathroom last bm yesterday

## 2019-10-13 NOTE — ED Provider Notes (Signed)
[facility] Emergency Department Provider Note  ____________________________________________   First MD Initiated Contact with Patient 10/13/19 1718     (approximate)  I have reviewed the triage vital signs and the nursing notes.   HISTORY  Chief Complaint Abdominal Pain and Nausea    HPI  James [name] is a 54 y.o. male  With PMHx myotonic dystrophy here with abd pain, distension. Sx started yesterday as mild diffuse abd cramping along with loose stools. Overnight, he had progressively worsening abd cramping as well as distension and decreased flatus throughout the day today. Has not been able to or tried to eat anything all day. Pain is minimal, diffuse. He feels bloated. Has not had a BM or passed flatus today. H/o constipation related to his myotonic dystrophy but no h/o obstructions. No prior abd surgeries. No fever or chills. No nausea or vomiting.        Past Medical History:  Diagnosis Date  . Bronchitis   . Cardiomyopathy ([place])   . Dysphagia   . GERD (gastroesophageal reflux disease)   . History of chicken pox   . History of pneumonia 2013   double PNA at [facility]  . LBBB (left bundle branch block)   . Myotonic dystrophy, type 1 ([place])    followed by [facility]  . Pneumonia    aspiration  . Sleep apnea     Patient Active Problem List   Diagnosis Date Noted  . Partial small bowel obstruction ([place]) 10/13/2019  . Numbness 02/16/2018  . Constipation 02/16/2018  . Avascular necrosis of hip, unspecified laterality ([place]) 11/08/2017  . Fall 11/08/2017  . Positive colorectal cancer screening using Cologuard test 03/31/2017  . ICD (implantable cardioverter-defibrillator) in place 09/23/2016  . Prediabetes 12/01/2015  . Heart palpitations 09/25/2015  . Obstructive sleep apnea 06/26/2015  . Rotator cuff tendinitis 05/22/2015  . Dysphagia   . 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 ED (erectile dysfunction) of  organic origin 06/01/2013  . Male hypogonadism 06/01/2013  . Cardiomyopathy ([place])   . Left bundle branch block   . Myotonic dystrophy, type 1 ([place])     Past Surgical History:  Procedure Laterality Date  . CARDIAC DEFIBRILLATOR PLACEMENT     defib/pacer  . CARDIAC DEFIBRILLATOR PLACEMENT    . COLONOSCOPY WITH PROPOFOL N/A 08/16/2016   Procedure: COLONOSCOPY WITH PROPOFOL;  Surgeon: Jonathon Bellows, MD;  Location: [facility] ENDOSCOPY;  Service: Gastroenterology;  Laterality: N/A;  . COLONOSCOPY WITH PROPOFOL N/A 04/04/2017   Procedure: COLONOSCOPY WITH PROPOFOL;  Surgeon: Jonathon Bellows, MD;  Location[facility] ENDOSCOPY;  Service: Gastroenterology;  Laterality: N/A;  . PACEMAKER IMPLANT    . US ECHOCARDIOGRAPHY  09/2010   EF 50%, mld LVH and dysfunction, mild RV dysfunction and enlarged    Prior to Admission medications   Medication Sig Start Date End Date Taking? Authorizing Provider  albuterol (PROVENTIL HFA;VENTOLIN HFA) 108 (90 Base) MCG/ACT inhaler Inhale 2 puffs into the lungs every 6 (six) hours as needed for wheezing or shortness of breath. 03/27/17   Laban Emperor, PA-C  albuterol (PROVENTIL) (2.5 MG/3ML) 0.083% nebulizer solution Take 3 mLs (2.5 mg total) by nebulization every 6 (six) hours as needed for wheezing or shortness of breath. 03/31/17   Leone Haven, MD  lisinopril (PRINIVIL,ZESTRIL) 5 MG tablet Take 5 mg by mouth daily.    [provider]  loratadine (CLARITIN) 10 MG tablet Take 1 tablet (10 mg total) by mouth daily. 04/13/15  Leone Haven, MD  Multiple Vitamin (MULTIVITAMIN) tablet Take 1 tablet by mouth daily.    [provider]    Allergies Codeine  Family History  Problem Relation Age of Onset  . Hypertension Father   . Cancer Paternal Grandfather        lung, smoker  . Cancer Paternal Uncle        bone  . Diabetes Mother   . CAD Mother 32       MI  . Stroke Neg Hx     Social History Social History   Tobacco Use  . Smoking status:  Never Smoker  . Smokeless tobacco: Never Used  Vaping Use  . Vaping Use: Never used  Substance Use Topics  . Alcohol use: No  . Drug use: No    Review of Systems  Review of Systems  Constitutional: Positive for fatigue. Negative for chills and fever.  HENT: Negative for sore throat.   Respiratory: Negative for shortness of breath.   Cardiovascular: Negative for chest pain.  Gastrointestinal: Positive for abdominal distention, abdominal pain, constipation and nausea.  Genitourinary: Negative for flank pain.  Musculoskeletal: Negative for neck pain.  Skin: Negative for rash and wound.  Allergic/Immunologic: Negative for immunocompromised state.  Neurological: Negative for weakness and numbness.  Hematological: Does not bruise/bleed easily.  All other systems reviewed and are negative.    ____________________________________________  PHYSICAL EXAM:      VITAL SIGNS: ED Triage Vitals  Enc Vitals Group     BP 10/13/19 1216 115/74     Pulse Rate 10/13/19 1216 96     Resp 10/13/19 1216 16     Temp 10/13/19 1216 98.1 F (36.7 C)     Temp Source 10/13/19 1216 Oral     SpO2 10/13/19 1216 94 %     Weight 10/13/19 1219 200 lb (90.7 kg)     Height 10/13/19 1219 5\' 11"  (1.803 m)     Head Circumference --      Peak Flow --      Pain Score 10/13/19 1218 7     Pain Loc --      Pain Edu? --      Excl. in [place]? --      Physical Exam Vitals and nursing note reviewed.  Constitutional:      General: He is not in acute distress.    Appearance: He is well-developed.  HENT:     Head: Normocephalic and atraumatic.  Eyes:     Conjunctiva/sclera: Conjunctivae normal.  Cardiovascular:     Rate and Rhythm: Normal rate and regular rhythm.     Heart sounds: Normal heart sounds. No murmur heard.  No friction rub.  Pulmonary:     Effort: Pulmonary effort is normal. No respiratory distress.     Breath sounds: Normal breath sounds. No wheezing or rales.  Abdominal:     General: Abdomen  is protuberant. Bowel sounds are decreased. There is distension.     Palpations: Abdomen is soft.     Tenderness: There is generalized abdominal tenderness. There is no right CVA tenderness, left CVA tenderness, guarding or rebound.  Musculoskeletal:     Cervical back: Neck supple.  Skin:    General: Skin is warm.     Capillary Refill: Capillary refill takes less than 2 seconds.  Neurological:     Mental Status: He is alert and oriented to person, place, and time.     Motor: No abnormal muscle tone.  ____________________________________________   LABS (all labs ordered are listed, but only abnormal results are displayed)  Labs Reviewed  COMPREHENSIVE METABOLIC PANEL - Abnormal; Notable for the following components:      Result Value   Glucose, Bld 123 (*)    Total Bilirubin 1.8 (*)    All other components within normal limits  SARS CORONAVIRUS 2 BY RT PCR (HOSPITAL ORDER, [facility])  LIPASE, BLOOD  CBC  URINALYSIS, COMPLETE (UACMP) WITH MICROSCOPIC    ____________________________________________  EKG: Atrial-sensed, V paced rhythm. VR 94, QRS 160. QTc 532. No acute ST elevations or depressions. ________________________________________  RADIOLOGY All imaging, including plain films, CT scans, and ultrasounds, independently reviewed by me, and interpretations confirmed via formal radiology reads.  ED MD interpretation:   CT A/P: Partial SBO  Official radiology report(s): CT ABDOMEN PELVIS W CONTRAST  Result Date: 10/13/2019 CLINICAL DATA:  Abdominal distension, mid abdominal pain, diarrhea yesterday, now unable to pass flatus EXAM: CT ABDOMEN AND PELVIS WITH CONTRAST TECHNIQUE: Multidetector CT imaging of the abdomen and pelvis was performed using the standard protocol following bolus administration of intravenous contrast. CONTRAST:  147mL OMNIPAQUE IOHEXOL 300 MG/ML  SOLN COMPARISON:  CT 11/01/2017 FINDINGS: Lower chest: Bandlike areas of  opacity in the lung bases likely reflecting subsegmental atelectatic change with additional dependent atelectasis posteriorly. Hepatobiliary: Diffuse hepatic hypoattenuation compatible with hepatic steatosis. No worrisome focal liver lesions. Smooth liver surface contour. The gallbladder appears completely decompressed with several calcifications, likely gallstones at the expected level of the cystic duct/gallbladder neck. No other intraductal gallstones are seen. No intra or extrahepatic biliary ductal dilatation. Pancreas: Partial fatty replacement of the pancreas. No pancreatic ductal dilatation or surrounding inflammatory changes. Spleen: Normal in size without focal abnormality. Adrenals/Urinary Tract: Normal adrenal glands. Kidneys are normally located with symmetric enhancement and excretion. Simple appearing, exophytic fluid attenuation cyst measuring 3.9 cm, minimally increased in size from prior and arising from the upper pole right kidney. Additional subcentimeter hypertension foci in both kidneys too small to fully characterize on CT imaging but statistically likely benign. No suspicious renal lesion, urolithiasis or hydronephrosis. Indentation of bladder base by a borderline enlarged prostate. No other gross bladder abnormality. Stomach/Bowel: Distal esophagus and stomach are unremarkable. Mildly thickened and borderline distended loops of small bowel are seen in the midline abdomen with gradual tapering proximal and distal to transitions (2/57, 2/68). Some increased vascularity of the adjacent mesentery with some reactive adenopathy and early hazy mesenteric changes. More distal small bowel is largely decompressed. Small amount of fluid throughout the proximal colon with some minimal formed stool at the level of the rectosigmoid. No colonic dilatation or wall thickening. A normal appendix is visualized. Vascular/Lymphatic: No significant vascular findings are present. No enlarged abdominal or pelvic  lymph nodes. Reproductive: Borderline enlarged prostate with indentation of the bladder base. No concerning focal lesions. Other: Some minimal focal stranding and nodularity of the omentum (2/55-56) is nonspecific and possibly related to the adjacent bowel process though an omental fat infarct or other omental process could present similarly. No abdominopelvic free air. Small amount of fluid in the mesenteric leaflets associated with the distended bowel segments. Musculoskeletal: Progressive sclerotic changes and articular surface collapse of the bilateral femoral heads with associated moderate to severe arthrosis of the bilateral hips likely reflecting progression of avascular necrosis. No other acute or suspicious osseous abnormalities. Multilevel degenerative changes are present in the imaged portions of the spine. IMPRESSION: 1. Mildly thickened and borderline distended loops of  small bowel in the midline abdomen with gradual tapering proximal and distal to transitions. Some increased vascularity of the adjacent mesentery with some reactive adenopathy and early hazy mesenteric changes. Findings are concerning for an early or partial small bowel obstruction or focal ileus, possibly related to an enteritis. 2. Some minimal focal stranding and questionable nodularity of the adjacent omentum is nonspecific and possibly related to the adjacent bowel process though an omental fat infarct or other omental process could present similarly. Metastatic nodularity is significantly less favored in the absence of known malignancy. 3. Progressive sclerotic changes and articular surface collapse of the bilateral femoral heads with associated moderate to severe arthrosis of the bilateral hips likely reflecting progression of avascular necrosis. 4. Hepatic steatosis. 5. Gallbladder contracted with several gallstones at the neck of the gallbladder or proximal cystic duct without evidence of acute cholecystitis. 6. Borderline  enlarged prostate with indentation of the bladder base. Correlate for symptoms of outlet obstruction. Electronically Signed   By: Lovena Le M.D.   On: 10/13/2019 19:01    ____________________________________________  PROCEDURES   Procedure(s) performed (including Critical Care):  Procedures  ____________________________________________  INITIAL IMPRESSION / MDM / [place] / ED COURSE  As part of my medical decision making, I reviewed the following data within the [place] notes reviewed and incorporated, Old chart reviewed, Notes from prior ED visits, and [place] [employer] Greer was evaluated in Emergency Department on 10/13/2019 for the symptoms described in the history of present illness. He was evaluated in the context of the global COVID-19 pandemic, which necessitated consideration that the patient might be at risk for infection with the SARS-CoV-2 virus that causes COVID-19. Institutional protocols and algorithms that pertain to the evaluation of patients at risk for COVID-19 are in a state of rapid change based on information released by regulatory bodies including the CDC and federal and state organizations. These policies and algorithms were followed during the patient's care in the ED.  Some ED evaluations and interventions may be delayed as a result of limited staffing during the pandemic.*  Clinical Course as of Oct 13 1938  Sun Oct 13, 2019  1845 Very pleasant 54 yo M here with abdominal pain, nausea, distension. DDx includes bowel obstruction, diverticulitis, colitis, gastritis/GERD. Will check CT. Labs reviewed and are reassuring - no leukocytosis, renal function and LFTs are normal.   [CI]  1939 Imaging shows partial SBO vs ileus. Pt with ongoing abd pain and distension. Admit for fluids, NPO, further management. Interestingly, pt has h/o recent similar episodes raising question of underlying ileus  related to his MD.   [CI]    Clinical Course User Index [CI] Duffy Bruce, MD    Medical Decision Making:  As above.  ____________________________________________  FINAL CLINICAL IMPRESSION(S) / ED DIAGNOSES  Final diagnoses:  Partial small bowel obstruction ([place])     MEDICATIONS GIVEN DURING THIS VISIT:  Medications  sodium chloride 0.9 % bolus 1,000 mL (1,000 mLs Intravenous New Bag/Given 10/13/19 1828)  ondansetron (ZOFRAN) injection 4 mg (4 mg Intravenous Given 10/13/19 1830)  iohexol (OMNIPAQUE) 300 MG/ML solution 100 mL (100 mLs Intravenous Contrast Given 10/13/19 1839)     ED Discharge Orders    None       Note:  This document was prepared using Dragon voice recognition software and may include unintentional dictation errors.   Duffy Bruce, MD 10/13/19 1940

## 2019-10-13 NOTE — ED Notes (Signed)
Blanket given for comfort no distress noted, cont to monitor

## 2019-10-13 NOTE — ED Notes (Signed)
Hospitalist at patient bedside

## 2019-10-13 NOTE — H&P (Signed)
[facility] NAME: James [name]    MR#:  [id number]  DATE OF BIRTH:  10-Jan-1966  DATE OF ADMISSION:  10/13/2019  PRIMARY CARE PHYSICIAN: Leone Haven, MD   REQUESTING/REFERRING PHYSICIAN: Duffy Bruce, MD CHIEF COMPLAINT:   Chief Complaint  Patient presents with  . Abdominal Pain  . Nausea    HISTORY OF PRESENT ILLNESS:  James [name]  is a 54 y.o. male with a known history of myotonic dystrophy type I, cardiomyopathy status post AICD, sleep apnea, GERD and aspiration pneumonia, who presented to the emergency room with acute onset of nausea with associated abdominal pain without vomiting which have been worsening over the last couple of days.  He had diarrhea yesterday however his last regular bowel movement was on Thursday.  He denies any fever or chills.  No chest pain or dyspnea or cough or wheezing.  He has dyspnea on exertion and probably mild wheezing for which he uses albuterol inhaler and nebulizer.  No dysuria, oliguria or hematuria or flank pain.  No headache or dizziness or blurred vision.  Upon presentation to the emergency room, vital signs were within normal.  CBC and CMP were unremarkable except for total bili of 1.8. EKG showed atrial sensed ventricular paced rhythm with a rate of 94 abdominal and pelvic CT scan revealed the following: 1. Mildly thickened and borderline distended loops of small bowel in the midline abdomen with gradual tapering proximal and distal to transitions. Some increased vascularity of the adjacent mesentery with some reactive adenopathy and early hazy mesenteric changes. Findings are concerning for an early or partial small bowel obstruction or focal ileus, possibly related to an enteritis. 2. Some minimal focal stranding and questionable nodularity of the adjacent omentum is nonspecific and possibly related to the adjacent bowel process though an omental fat infarct or other omental process could  present similarly. Metastatic nodularity is significantly less favored in the absence of known malignancy. 3. Progressive sclerotic changes and articular surface collapse of the bilateral femoral heads with associated moderate to severe arthrosis of the bilateral hips likely reflecting progression of avascular necrosis. 4. Hepatic steatosis. 5. Gallbladder contracted with several gallstones at the neck of the gallbladder or proximal cystic duct without evidence of acute cholecystitis. 6. Borderline enlarged prostate with indentation of the bladder base.  The patient was given 1 L bolus of IV normal saline and 4 mg of IV Zofran.  He will be admitted to an observation medical bed for further evaluation and management. PAST MEDICAL HISTORY:   Past Medical History:  Diagnosis Date  . Bronchitis   . Cardiomyopathy ([place])   . Dysphagia   . GERD (gastroesophageal reflux disease)   . History of chicken pox   . History of pneumonia 2013   double PNA at [facility]  . LBBB (left bundle branch block)   . Myotonic dystrophy, type 1 ([place])    followed by [facility]  . Pneumonia    aspiration  . Sleep apnea     PAST SURGICAL HISTORY:   Past Surgical History:  Procedure Laterality Date  . CARDIAC DEFIBRILLATOR PLACEMENT     defib/pacer  . CARDIAC DEFIBRILLATOR PLACEMENT    . COLONOSCOPY WITH PROPOFOL N/A 08/16/2016   Procedure: COLONOSCOPY WITH PROPOFOL;  Surgeon: Jonathon Bellows, MD;  Location: [facility] ENDOSCOPY;  Service: Gastroenterology;  Laterality: N/A;  . COLONOSCOPY WITH PROPOFOL N/A 04/04/2017   Procedure: COLONOSCOPY WITH PROPOFOL;  Surgeon: Jonathon Bellows, MD;  Location:  [facility] ENDOSCOPY;  Service: Gastroenterology;  Laterality: N/A;  . PACEMAKER IMPLANT    . US ECHOCARDIOGRAPHY  09/2010   EF 50%, mld LVH and dysfunction, mild RV dysfunction and enlarged    SOCIAL HISTORY:   Social History   Tobacco Use  . Smoking status: Never Smoker  . Smokeless tobacco: Never Used  Substance Use Topics  .  Alcohol use: No    FAMILY HISTORY:   Family History  Problem Relation Age of Onset  . Hypertension Father   . Cancer Paternal Grandfather        lung, smoker  . Cancer Paternal Uncle        bone  . Diabetes Mother   . CAD Mother 47       MI  . Stroke Neg Hx     DRUG ALLERGIES:   Allergies  Allergen Reactions  . Codeine Nausea Only    REVIEW OF SYSTEMS:   ROS As per history of present illness. All pertinent systems were reviewed above. Constitutional, HEENT, cardiovascular, respiratory, GI, GU, musculoskeletal, neuro, psychiatric, endocrine, integumentary and hematologic systems were reviewed and are otherwise negative/unremarkable except for positive findings mentioned above in the HPI.   MEDICATIONS AT HOME:   Prior to Admission medications   Medication Sig Start Date End Date Taking? Authorizing Provider  lisinopril (PRINIVIL,ZESTRIL) 5 MG tablet Take 2.5 mg by mouth 2 (two) times daily.    Yes [provider]  Multiple Vitamin (MULTIVITAMIN) tablet Take 1 tablet by mouth daily.   Yes [provider]  albuterol (PROVENTIL HFA;VENTOLIN HFA) 108 (90 Base) MCG/ACT inhaler Inhale 2 puffs into the lungs every 6 (six) hours as needed for wheezing or shortness of breath. Patient not taking: Reported on 10/13/2019 03/27/17   Laban Emperor, PA-C  albuterol (PROVENTIL) (2.5 MG/3ML) 0.083% nebulizer solution Take 3 mLs (2.5 mg total) by nebulization every 6 (six) hours as needed for wheezing or shortness of breath. 03/31/17   Leone Haven, MD  loratadine (CLARITIN) 10 MG tablet Take 1 tablet (10 mg total) by mouth daily. Patient not taking: Reported on 10/13/2019 04/13/15   Leone Haven, MD      VITAL SIGNS:  Blood pressure 110/71, pulse 84, temperature 98.1 F (36.7 C), temperature source Oral, resp. rate 14, height 5\' 11"  (1.803 m), weight 90.7 kg, SpO2 91 %.  PHYSICAL EXAMINATION:  Physical Exam  GENERAL:  54 y.o.-year-old Caucasian male patient  lying in the bed with no acute distress.  He is slow to answer questions with masked face likely related to myotonic dystrophy. EYES: Pupils equal, round, reactive to light and accommodation. No scleral icterus. Extraocular muscles intact.  HEENT: Head atraumatic, normocephalic. Oropharynx and nasopharynx clear.  NECK:  Supple, no jugular venous distention. No thyroid enlargement, no tenderness.  LUNGS: Normal breath sounds bilaterally, no wheezing, rales,rhonchi or crepitation. No use of accessory muscles of respiration.  CARDIOVASCULAR: Regular rate and rhythm, S1, S2 normal. No murmurs, rubs, or gallops.  ABDOMEN: Soft, mildly distended with generalized tenderness mainly in the periumbilical area with significantly diminished bowel sounds.  No organomegaly or mass.  EXTREMITIES: No pedal edema, cyanosis, or clubbing.  NEUROLOGIC: Cranial nerves II through XII are intact. Muscle strength 5/5 in all extremities. Sensation intact. Gait not checked.  PSYCHIATRIC: The patient is alert and oriented x 3.  Normal affect and good eye contact. SKIN: No obvious rash, lesion, or ulcer.   LABORATORY PANEL:   CBC Recent Labs  Lab 10/13/19 1229  WBC  10.4  HGB 16.1  HCT 49.0  PLT 194   ------------------------------------------------------------------------------------------------------------------  Chemistries  Recent Labs  Lab 10/13/19 1229  NA 142  K 3.8  CL 109  CO2 24  GLUCOSE 123*  BUN 9  CREATININE 0.78  CALCIUM 8.9  AST 19  ALT 26  ALKPHOS 72  BILITOT 1.8*   ------------------------------------------------------------------------------------------------------------------  Cardiac Enzymes No results for input(s): TROPONINI in the last 168 hours. ------------------------------------------------------------------------------------------------------------------  RADIOLOGY:  CT ABDOMEN PELVIS W CONTRAST  Result Date: 10/13/2019 CLINICAL DATA:  Abdominal distension, mid  abdominal pain, diarrhea yesterday, now unable to pass flatus EXAM: CT ABDOMEN AND PELVIS WITH CONTRAST TECHNIQUE: Multidetector CT imaging of the abdomen and pelvis was performed using the standard protocol following bolus administration of intravenous contrast. CONTRAST:  128mL OMNIPAQUE IOHEXOL 300 MG/ML  SOLN COMPARISON:  CT 11/01/2017 FINDINGS: Lower chest: Bandlike areas of opacity in the lung bases likely reflecting subsegmental atelectatic change with additional dependent atelectasis posteriorly. Hepatobiliary: Diffuse hepatic hypoattenuation compatible with hepatic steatosis. No worrisome focal liver lesions. Smooth liver surface contour. The gallbladder appears completely decompressed with several calcifications, likely gallstones at the expected level of the cystic duct/gallbladder neck. No other intraductal gallstones are seen. No intra or extrahepatic biliary ductal dilatation. Pancreas: Partial fatty replacement of the pancreas. No pancreatic ductal dilatation or surrounding inflammatory changes. Spleen: Normal in size without focal abnormality. Adrenals/Urinary Tract: Normal adrenal glands. Kidneys are normally located with symmetric enhancement and excretion. Simple appearing, exophytic fluid attenuation cyst measuring 3.9 cm, minimally increased in size from prior and arising from the upper pole right kidney. Additional subcentimeter hypertension foci in both kidneys too small to fully characterize on CT imaging but statistically likely benign. No suspicious renal lesion, urolithiasis or hydronephrosis. Indentation of bladder base by a borderline enlarged prostate. No other gross bladder abnormality. Stomach/Bowel: Distal esophagus and stomach are unremarkable. Mildly thickened and borderline distended loops of small bowel are seen in the midline abdomen with gradual tapering proximal and distal to transitions (2/57, 2/68). Some increased vascularity of the adjacent mesentery with some reactive  adenopathy and early hazy mesenteric changes. More distal small bowel is largely decompressed. Small amount of fluid throughout the proximal colon with some minimal formed stool at the level of the rectosigmoid. No colonic dilatation or wall thickening. A normal appendix is visualized. Vascular/Lymphatic: No significant vascular findings are present. No enlarged abdominal or pelvic lymph nodes. Reproductive: Borderline enlarged prostate with indentation of the bladder base. No concerning focal lesions. Other: Some minimal focal stranding and nodularity of the omentum (2/55-56) is nonspecific and possibly related to the adjacent bowel process though an omental fat infarct or other omental process could present similarly. No abdominopelvic free air. Small amount of fluid in the mesenteric leaflets associated with the distended bowel segments. Musculoskeletal: Progressive sclerotic changes and articular surface collapse of the bilateral femoral heads with associated moderate to severe arthrosis of the bilateral hips likely reflecting progression of avascular necrosis. No other acute or suspicious osseous abnormalities. Multilevel degenerative changes are present in the imaged portions of the spine. IMPRESSION: 1. Mildly thickened and borderline distended loops of small bowel in the midline abdomen with gradual tapering proximal and distal to transitions. Some increased vascularity of the adjacent mesentery with some reactive adenopathy and early hazy mesenteric changes. Findings are concerning for an early or partial small bowel obstruction or focal ileus, possibly related to an enteritis. 2. Some minimal focal stranding and questionable nodularity of the adjacent omentum is nonspecific and possibly  related to the adjacent bowel process though an omental fat infarct or other omental process could present similarly. Metastatic nodularity is significantly less favored in the absence of known malignancy. 3. 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 4. Hepatic steatosis. 5. Gallbladder contracted with several gallstones at the neck of the gallbladder or proximal cystic duct without evidence of acute cholecystitis. 6. Borderline enlarged prostate with indentation of the bladder base. Correlate for symptoms of outlet obstruction. Electronically Signed   By: Lovena Le M.D.   On: 10/13/2019 19:01      IMPRESSION AND PLAN:   1.  Partial small bowel obstruction with associated nausea and generalized abdominal pain. -The patient will be admitted to an observation medical bed. -He will be kept n.p.o. except for medications with sips of water. -We will follow two-view abdomen x-ray in a.m. -A general surgery consultation will be obtained for follow-up. -I notified Dr. Hampton Abbot about the patient. -Pain management will be provided.  2.  Hypertension -Continue Zestril.  3.  Myotonic dystrophy, type I. -This could be contributing to #1. -Management as above.  4.  DVT prophylaxis. -Subcutaneous Lovenox   All the records are reviewed and case discussed with ED provider. The plan of care was discussed in details with the patient (and family). I answered all questions. The patient agreed to proceed with the above mentioned plan. Further management will depend upon hospital course.   CODE STATUS: Full code  Status is: Observation  The patient remains OBS appropriate and will d/c before 2 midnights.  Dispo: The patient is from: Home              Anticipated d/c is to: Home              Anticipated d/c date is: 1 day              Patient currently is not medically stable to d/c.   TOTAL TIME TAKING CARE OF THIS PATIENT:55 minutes.    Christel Mormon M.D on 10/13/2019 at 8:05 PM  [facility]   From 7 PM-7 AM, contact  night-coverage [email or web address]  CC: Primary care physician; Leone Haven, MD   Note: This dictation was prepared with Dragon dictation along with smaller phrase technology. Any transcriptional typo errors that result from this process are unintentional.

## 2019-10-13 NOTE — ED Notes (Signed)
Pt offered blanket when vital signs rechecked. Pt declines at this time.

## 2019-10-13 NOTE — ED Notes (Signed)
Wife at bedside.

## 2019-10-13 NOTE — ED Notes (Signed)
Pt states that his symptoms started yesterday afternoon, pt c/o mid abd pain, states that he had diarrhea yesterday, and able to pass gas, not passing flatus today, reports some belching, pt's abd is distended but he and wife state it appears his normal. No bs noted throughout, non tender with palpation

## 2019-10-14 ENCOUNTER — Encounter: Payer: Self-pay | Admitting: Family Medicine

## 2019-10-14 ENCOUNTER — Observation Stay: Payer: Medicare HMO

## 2019-10-14 DIAGNOSIS — Z79899 Other long term (current) drug therapy: Secondary | ICD-10-CM | POA: Diagnosis not present

## 2019-10-14 DIAGNOSIS — I10 Essential (primary) hypertension: Secondary | ICD-10-CM | POA: Diagnosis present

## 2019-10-14 DIAGNOSIS — K56600 Partial intestinal obstruction, unspecified as to cause: Principal | ICD-10-CM

## 2019-10-14 DIAGNOSIS — Z833 Family history of diabetes mellitus: Secondary | ICD-10-CM | POA: Diagnosis not present

## 2019-10-14 DIAGNOSIS — K567 Ileus, unspecified: Secondary | ICD-10-CM | POA: Diagnosis not present

## 2019-10-14 DIAGNOSIS — N3289 Other specified disorders of bladder: Secondary | ICD-10-CM | POA: Diagnosis not present

## 2019-10-14 DIAGNOSIS — K529 Noninfective gastroenteritis and colitis, unspecified: Secondary | ICD-10-CM | POA: Diagnosis present

## 2019-10-14 DIAGNOSIS — K6389 Other specified diseases of intestine: Secondary | ICD-10-CM | POA: Diagnosis not present

## 2019-10-14 DIAGNOSIS — M87059 Idiopathic aseptic necrosis of unspecified femur: Secondary | ICD-10-CM | POA: Diagnosis not present

## 2019-10-14 DIAGNOSIS — M879 Osteonecrosis, unspecified: Secondary | ICD-10-CM | POA: Diagnosis present

## 2019-10-14 DIAGNOSIS — G7111 Myotonic muscular dystrophy: Secondary | ICD-10-CM | POA: Diagnosis present

## 2019-10-14 DIAGNOSIS — K56609 Unspecified intestinal obstruction, unspecified as to partial versus complete obstruction: Secondary | ICD-10-CM | POA: Diagnosis present

## 2019-10-14 DIAGNOSIS — Z95810 Presence of automatic (implantable) cardiac defibrillator: Secondary | ICD-10-CM | POA: Diagnosis not present

## 2019-10-14 DIAGNOSIS — I429 Cardiomyopathy, unspecified: Secondary | ICD-10-CM | POA: Diagnosis present

## 2019-10-14 DIAGNOSIS — Z8249 Family history of ischemic heart disease and other diseases of the circulatory system: Secondary | ICD-10-CM | POA: Diagnosis not present

## 2019-10-14 DIAGNOSIS — Z20822 Contact with and (suspected) exposure to covid-19: Secondary | ICD-10-CM | POA: Diagnosis present

## 2019-10-14 DIAGNOSIS — G4733 Obstructive sleep apnea (adult) (pediatric): Secondary | ICD-10-CM | POA: Diagnosis present

## 2019-10-14 DIAGNOSIS — K219 Gastro-esophageal reflux disease without esophagitis: Secondary | ICD-10-CM | POA: Diagnosis present

## 2019-10-14 LAB — GASTROINTESTINAL PANEL BY PCR, STOOL (REPLACES STOOL CULTURE)

## 2019-10-14 LAB — C DIFFICILE QUICK SCREEN W PCR REFLEX
C Diff antigen: NEGATIVE
C Diff interpretation: NOT DETECTED
C Diff toxin: NEGATIVE

## 2019-10-14 LAB — BASIC METABOLIC PANEL
Anion gap: 3 — ABNORMAL LOW (ref 5–15)
BUN: 7 mg/dL (ref 6–20)
CO2: 29 mmol/L (ref 22–32)
Calcium: 8.1 mg/dL — ABNORMAL LOW (ref 8.9–10.3)
Chloride: 109 mmol/L (ref 98–111)
Creatinine, Ser: 0.83 mg/dL (ref 0.61–1.24)
GFR calc Af Amer: >60 mL/min (ref >60)
GFR calc non Af Amer: >60 mL/min (ref >60)
Glucose, Bld: 96 mg/dL (ref 70–99)
Potassium: 5.1 mmol/L (ref 3.5–5.1)
Sodium: 141 mmol/L (ref 135–145)

## 2019-10-14 LAB — CBC
HCT: 43.1 % (ref 39.0–52.0)
Hemoglobin: 14.5 g/dL (ref 13.0–17.0)
MCH: 32.2 pg (ref 26.0–34.0)
MCHC: 33.6 g/dL (ref 30.0–36.0)
MCV: 95.8 fL (ref 80.0–100.0)
Platelets: 151 10*3/uL (ref 150–400)
RBC: 4.5 MIL/uL (ref 4.22–5.81)
RDW: 13.4 % (ref 11.5–15.5)
WBC: 6.7 10*3/uL (ref 4.0–10.5)
nRBC: 0 % (ref 0.0–0.2)

## 2019-10-14 LAB — HIV ANTIBODY (ROUTINE TESTING W REFLEX): HIV Screen 4th Generation wRfx: NONREACTIVE

## 2019-10-14 MED ORDER — PANTOPRAZOLE SODIUM 40 MG IV SOLR
40.0000 mg | INTRAVENOUS | Status: DC
  Administered 2019-10-14 – 2019-10-16 (×3): 40 mg via INTRAVENOUS
  Filled 2019-10-14 (×3): qty 40

## 2019-10-14 MED ORDER — PIPERACILLIN-TAZOBACTAM 3.375 G IVPB
3.3750 g | Freq: Three times a day (TID) | INTRAVENOUS | Status: DC
  Administered 2019-10-14: 3.375 g via INTRAVENOUS
  Filled 2019-10-14: qty 50

## 2019-10-14 NOTE — Progress Notes (Signed)
PROGRESS NOTE    James [name]  RSW:[id number] DOB: [birth year] DOA: 10/13/2019 PCP: Leone Haven, MD    Chief Complaint  Patient presents with  . Abdominal Pain  . Nausea    Brief Narrative:  54 year old gentleman with prior history of myotonic dystrophy, cardiomyopathy s/p AICD, GERD, sleep apnea presented to ED with abdominal pain associated with some nausea but without any vomiting.  Patient reports he had BM Thursday.  He underwent CT of the abdomen and pelvis showing distended loops of small bowel in the midline abdomen with gradual tapering proximal and distal to the transitions.  Was also found to have increased vascularity with some reactive adenopathy with early hazy and eccentric changes findings concerning for partial bowel obstruction or focal ileus.  Assessment & Plan:   Active Problems:   Partial small bowel obstruction (HCC)  Partial small bowel obstruction and enteritis Pain control and n.p.o. General surgery consulted and appreciate recommendations.  NG tube not placed as patient is not actively vomiting and he would like to avoid NG tube if possible. Gently hydrate. Patient has history of myotonic dystrophy and general surgery recommends IR consult for initiation of prokinetic agents. Patient was started on IV Zosyn for possible enteritis. He had an episode of diarrhea on Thursday   History of cardiomyopathy status post AICD Patient currently denies any chest pain or shortness of breath.   History of GERD Start the patient on PPI.         DVT prophylaxis: Lovenox Code Status: Full code Family Communication: (None at bedside Disposition:   Status is: Observation  The patient will require care spanning > 2 midnights and should be moved to inpatient because: IV treatments appropriate due to intensity of illness or inability to take PO  Dispo: The patient is from: Home              Anticipated d/c is to: pending.               Anticipated d/c  date is: 2 days              Patient currently is not medically stable to d/c.       Consultants:  General surgery.    Procedures: none.    Antimicrobials: none.    Subjective: Persistent abdominal pain with nausea, no vomiting. No flatulence, no BM in the last 24 hours.   Objective: Vitals:   10/14/19 0051 10/14/19 0155 10/14/19 0200 10/14/19 0459  BP: (!) 89/55 (!) 93/53  101/64  Pulse: 80 77  69  Resp: 16   16  Temp: 98.2 F (36.8 C)   98.8 F (37.1 C)  TempSrc: Oral   Oral  SpO2: 90% 91% 93% 92%  Weight:      Height:        Intake/Output Summary (Last 24 hours) at 10/14/2019 1255 Last data filed at 10/14/2019 0500 Gross per 24 hour  Intake 1575.86 ml  Output 0 ml  Net 1575.86 ml   Filed Weights   10/13/19 1219 10/13/19 2106  Weight: 90.7 kg 97.1 kg    Examination:  General exam: Appears calm and comfortable  Respiratory system: Clear to auscultation. Respiratory effort normal. Cardiovascular system: S1 & S2 heard, RRR. No JVD,  No pedal edema. Gastrointestinal system: Abdomen is soft , tender and distended, bowel sounds minimal. Central nervous system: Alert and oriented. No focal neurological deficits. Extremities: Symmetric 5 x 5 power. Skin: No rashes, lesions or ulcers Psychiatry:  Mood &  affect appropriate.     Data Reviewed: I have personally reviewed following labs and imaging studies  CBC: Recent Labs  Lab 10/13/19 1229 10/14/19 0540  WBC 10.4 6.7  HGB 16.1 14.5  HCT 49.0 43.1  MCV 96.5 95.8  PLT 194 149    Basic Metabolic Panel: Recent Labs  Lab 10/13/19 1229 10/14/19 0540  NA 142 141  K 3.8 5.1  CL 109 109  CO2 24 29  GLUCOSE 123* 96  BUN 9 7  CREATININE 0.78 0.83  CALCIUM 8.9 8.1*    GFR: Estimated Creatinine Clearance: 122.3 mL/min (by C-G formula based on SCr of 0.83 mg/dL).  Liver Function Tests: Recent Labs  Lab 10/13/19 1229  AST 19  ALT 26  ALKPHOS 72  BILITOT 1.8*  PROT 7.3  ALBUMIN 3.8     CBG: No results for input(s): GLUCAP in the last 168 hours.   Recent Results (from the past 240 hour(s))  SARS Coronavirus 2 by RT PCR (hospital order, performed in [facility] hospital lab) Nasopharyngeal Nasopharyngeal Swab     Status: None   Collection Time: 10/13/19  8:10 PM   Specimen: Nasopharyngeal Swab  Result Value Ref Range Status   SARS Coronavirus 2 NEGATIVE NEGATIVE Final    Comment: (NOTE) SARS-CoV-2 target nucleic acids are NOT DETECTED.  The SARS-CoV-2 RNA is generally detectable in upper and lower respiratory specimens during the acute phase of infection. The lowest concentration of SARS-CoV-2 viral copies this assay can detect is 250 copies / mL. A negative result does not preclude SARS-CoV-2 infection and should not be used as the sole basis for treatment or other patient management decisions.  A negative result may occur with improper specimen collection / handling, submission of specimen other than nasopharyngeal swab, presence of viral mutation(s) within the areas targeted by this assay, and inadequate number of viral copies (<250 copies / mL). A negative result must be combined with clinical observations, patient history, and epidemiological information.  Fact Sheet for Patients:   StrictlyIdeas.no  Fact Sheet for Healthcare Providers: BankingDealers.co.za  This test is not yet approved or  cleared by the [place] FDA and has been authorized for detection and/or diagnosis of SARS-CoV-2 by FDA under an Emergency Use Authorization (EUA).  This EUA will remain in effect (meaning this test can be used) for the duration of the COVID-19 declaration under Section 564(b)(1) of the Act, 21 U.S.C. section 360bbb-3(b)(1), unless the authorization is terminated or revoked sooner.  Performed at [facility], [address], [place], [place] [address]          Radiology Studies: CT ABDOMEN  PELVIS W CONTRAST  Result Date: 10/13/2019 CLINICAL DATA:  Abdominal distension, mid abdominal pain, diarrhea yesterday, now unable to pass flatus EXAM: CT ABDOMEN AND PELVIS WITH CONTRAST TECHNIQUE: Multidetector CT imaging of the abdomen and pelvis was performed using the standard protocol following bolus administration of intravenous contrast. CONTRAST:  168mL OMNIPAQUE IOHEXOL 300 MG/ML  SOLN COMPARISON:  CT 11/01/2017 FINDINGS: Lower chest: Bandlike areas of opacity in the lung bases likely reflecting subsegmental atelectatic change with additional dependent atelectasis posteriorly. Hepatobiliary: Diffuse hepatic hypoattenuation compatible with hepatic steatosis. No worrisome focal liver lesions. Smooth liver surface contour. The gallbladder appears completely decompressed with several calcifications, likely gallstones at the expected level of the cystic duct/gallbladder neck. No other intraductal gallstones are seen. No intra or extrahepatic biliary ductal dilatation. Pancreas: Partial fatty replacement of the pancreas. No pancreatic ductal dilatation or surrounding inflammatory changes.  Spleen: Normal in size without focal abnormality. Adrenals/Urinary Tract: Normal adrenal glands. Kidneys are normally located with symmetric enhancement and excretion. Simple appearing, exophytic fluid attenuation cyst measuring 3.9 cm, minimally increased in size from prior and arising from the upper pole right kidney. Additional subcentimeter hypertension foci in both kidneys too small to fully characterize on CT imaging but statistically likely benign. No suspicious renal lesion, urolithiasis or hydronephrosis. Indentation of bladder base by a borderline enlarged prostate. No other gross bladder abnormality. Stomach/Bowel: Distal esophagus and stomach are unremarkable. Mildly thickened and borderline distended loops of small bowel are seen in the midline abdomen with gradual tapering proximal and distal to transitions  (2/57, 2/68). Some increased vascularity of the adjacent mesentery with some reactive adenopathy and early hazy mesenteric changes. More distal small bowel is largely decompressed. Small amount of fluid throughout the proximal colon with some minimal formed stool at the level of the rectosigmoid. No colonic dilatation or wall thickening. A normal appendix is visualized. Vascular/Lymphatic: No significant vascular findings are present. No enlarged abdominal or pelvic lymph nodes. Reproductive: Borderline enlarged prostate with indentation of the bladder base. No concerning focal lesions. Other: Some minimal focal stranding and nodularity of the omentum (2/55-56) is nonspecific and possibly related to the adjacent bowel process though an omental fat infarct or other omental process could present similarly. No abdominopelvic free air. Small amount of fluid in the mesenteric leaflets associated with the distended bowel segments. Musculoskeletal: Progressive sclerotic changes and articular surface collapse of the bilateral femoral heads with associated moderate to severe arthrosis of the bilateral hips likely reflecting progression of avascular necrosis. No other acute or suspicious osseous abnormalities. Multilevel degenerative changes are present in the imaged portions of the spine. IMPRESSION: 1. Mildly thickened and borderline distended loops of small bowel in the midline abdomen with gradual tapering proximal and distal to transitions. Some increased vascularity of the adjacent mesentery with some reactive adenopathy and early hazy mesenteric changes. Findings are concerning for an early or partial small bowel obstruction or focal ileus, possibly related to an enteritis. 2. Some minimal focal stranding and questionable nodularity of the adjacent omentum is nonspecific and possibly related to the adjacent bowel process though an omental fat infarct or other omental process could present similarly. Metastatic  nodularity is significantly less favored in the absence of known malignancy. 3. Progressive sclerotic changes and articular surface collapse of the bilateral femoral heads with associated moderate to severe arthrosis of the bilateral hips likely reflecting progression of avascular necrosis. 4. Hepatic steatosis. 5. Gallbladder contracted with several gallstones at the neck of the gallbladder or proximal cystic duct without evidence of acute cholecystitis. 6. Borderline enlarged prostate with indentation of the bladder base. Correlate for symptoms of outlet obstruction. Electronically Signed   By: Lovena Le M.D.   On: 10/13/2019 19:01   DG Abd 2 Views  Result Date: 10/14/2019 CLINICAL DATA:  SBO EXAM: ABDOMEN - 2 VIEW COMPARISON:  10/13/2019 CT abdomen pelvis. FINDINGS: Prominent to mildly dilated gas-filled small bowel loops with right hemiabdomen air-fluid levels. Rounded right upper quadrant calcific densities likely reflect gallstones. No evidence of pneumoperitoneum. Contrast opacified urinary bladder. Sequela of bilateral hip avascular necrosis. Partially imaged pacing lead. IMPRESSION: Mildly dilated small bowel loops with air-fluid levels is concerning for obstruction versus ileus. Cholelithiasis. Bilateral hip avascular necrosis. Electronically Signed   By: Primitivo Gauze M.D.   On: 10/14/2019 08:40        Scheduled Meds: . enoxaparin (LOVENOX) injection  40  mg Subcutaneous Q24H  . lisinopril  5 mg Oral Daily  . loratadine  10 mg Oral Daily  . multivitamin with minerals  1 tablet Oral Daily   Continuous Infusions: . sodium chloride 100 mL/hr at 10/14/19 0622     LOS: 0 days        Hosie Poisson, MD [facility]   To contact the attending provider between 7A-7P or the covering provider during after hours 7P-7A, please log into the web site [email or web address] and access using universal [place] password for that web site. If you do not have the password, please call  the hospital operator.  10/14/2019, 12:55 PM

## 2019-10-14 NOTE — Consult Note (Signed)
Pharmacy Antibiotic Note  James [name] is a 54 y.o. male admitted on 10/13/2019 with prior history of myotonic dystrophy, cardiomyopathy s/p AICD, GERD, sleep apnea presents and decreased bowel function concerning for possible ileus -vs- enteritis. Pharmacy has been consulted for Zosyn dosing.  Plan:   Start  Zosyn 3.375g IV q8h (4 hour infusion).  Height: 5\' 11"  (180.3 cm) Weight: 97.1 kg (214 lb 1.6 oz) IBW/kg (Calculated) : 75.3  Temp (24hrs), Avg:98.2 F (36.8 C), Min:97.7 F (36.5 C), Max:98.8 F (37.1 C)  Recent Labs  Lab 10/13/19 1229 10/14/19 0540  WBC 10.4 6.7  CREATININE 0.78 0.83    Estimated Creatinine Clearance: 122.3 mL/min (by C-G formula based on SCr of 0.83 mg/dL).    Antimicrobials this admission: Zosyn 7/19 >>   Microbiology results: 7/18 SARS CoV-2: negative   Thank you for allowing pharmacy to be a part of this patients care.  Dallie Piles 10/14/2019 1:27 PM

## 2019-10-14 NOTE — Consult Note (Signed)
[facility] SURGICAL CONSULTATION NOTE (initial) - cpt: 30160   HISTORY OF PRESENT ILLNESS (HPI):  54 y.o. male presented to [facility] ED yesterday for evaluation of abdominal pain. Patient reports that on the day prior to admission he had noticed fairly mild but diffuse abdominal cramping pain. This progressively worsened over the course of the next day and became more intense. Additionally, he developed abdominal distension and decrease in flatus as the pain progressed. Initially, he did report loose stools but his bowel movements have ceased. No fever, chills, nausea, emesis, cough, congestion, CP, SOB, or urinary changes. He does have a history of constipation secondary to his myotonic dystrophy. No history of similar. No previous abdominal surgeries. He did have a colonoscopy on 03/2017 with Dr Vicente Males which showed multiple sessile polyps which were benign. Work up in the ED yesterday was concerning for a few loops of distended bowel on CT Abdomen/Pelvis concerning for possible obstruction -vs- ileus -vs- enteritis. He was admitted to the medicine service.   Surgery is consulted by hospitalist physician Dr. Eugenie Norrie, MD in this context for evaluation and management of possible pSBO, ileus, enteritis.   PAST MEDICAL HISTORY (PMH):  Past Medical History:  Diagnosis Date  . Bronchitis   . Cardiomyopathy ([place])   . Dysphagia   . GERD (gastroesophageal reflux disease)   . History of chicken pox   . History of pneumonia 2013   double PNA at [facility]  . LBBB (left bundle branch block)   . Myotonic dystrophy, type 1 ([place])    followed by [facility]  . Pneumonia    aspiration  . Sleep apnea      PAST SURGICAL HISTORY [facility]):  Past Surgical History:  Procedure Laterality Date  . CARDIAC DEFIBRILLATOR PLACEMENT     defib/pacer  . CARDIAC DEFIBRILLATOR PLACEMENT    . COLONOSCOPY WITH PROPOFOL N/A 08/16/2016   Procedure: COLONOSCOPY WITH PROPOFOL;  Surgeon: Jonathon Bellows, MD;  Location: [facility]  ENDOSCOPY;  Service: Gastroenterology;  Laterality: N/A;  . COLONOSCOPY WITH PROPOFOL N/A 04/04/2017   Procedure: COLONOSCOPY WITH PROPOFOL;  Surgeon: Jonathon Bellows, MD;  Location: [facility] ENDOSCOPY;  Service: Gastroenterology;  Laterality: N/A;  . PACEMAKER IMPLANT    . US ECHOCARDIOGRAPHY  09/2010   EF 50%, mld LVH and dysfunction, mild RV dysfunction and enlarged     MEDICATIONS:  Prior to Admission medications   Medication Sig Start Date End Date Taking? Authorizing Provider  lisinopril (PRINIVIL,ZESTRIL) 5 MG tablet Take 2.5 mg by mouth 2 (two) times daily.    Yes [provider]  Multiple Vitamin (MULTIVITAMIN) tablet Take 1 tablet by mouth daily.   Yes [provider]  albuterol (PROVENTIL HFA;VENTOLIN HFA) 108 (90 Base) MCG/ACT inhaler Inhale 2 puffs into the lungs every 6 (six) hours as needed for wheezing or shortness of breath. Patient not taking: Reported on 10/13/2019 03/27/17   Laban Emperor, PA-C  albuterol (PROVENTIL) (2.5 MG/3ML) 0.083% nebulizer solution Take 3 mLs (2.5 mg total) by nebulization every 6 (six) hours as needed for wheezing or shortness of breath. 03/31/17   Leone Haven, MD  loratadine (CLARITIN) 10 MG tablet Take 1 tablet (10 mg total) by mouth daily. Patient not taking: Reported on 10/13/2019 04/13/15   Leone Haven, MD     ALLERGIES:  Allergies  Allergen Reactions  . Codeine Nausea Only     SOCIAL HISTORY:  Social History   Socioeconomic History  . Marital status: Married    Spouse name: Not on file  .  Number of children: Not on file  . Years of education: Not on file  . Highest education level: Not on file  Occupational History  . Not on file  Tobacco Use  . Smoking status: Never Smoker  . Smokeless tobacco: Never Used  Vaping Use  . Vaping Use: Never used  Substance and Sexual Activity  . Alcohol use: No  . Drug use: No  . Sexual activity: Not on file  Other Topics Concern  . 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 Difficulty of Paying Living Expenses:   Food Insecurity:   . Worried About Charity fundraiser in the Last Year:   . Arboriculturist in the Last Year:   Transportation Needs:   . Film/video editor (Medical):   Marland Kitchen Lack of Transportation (Non-Medical):   Physical Activity:   . Days of Exercise per Week:   . Minutes of Exercise per Session:   Stress:   . Feeling of Stress :   Social Connections:   . Frequency of Communication with Friends and Family:   . Frequency of Social Gatherings with Friends and Family:   . Attends Religious Services:   . Active Member of Clubs or Organizations:   . Attends Archivist Meetings:   Marland Kitchen Marital Status:   Intimate Partner Violence:   . Fear of Current or Ex-Partner:   . Emotionally Abused:   Marland Kitchen Physically Abused:   . Sexually Abused:      FAMILY HISTORY:  Family History  Problem Relation Age of Onset  . Hypertension Father   . Cancer Paternal Grandfather        lung, smoker  . Cancer Paternal Uncle        bone  . Diabetes Mother   . CAD Mother 3       MI  . Stroke Neg Hx       REVIEW OF SYSTEMS:  Review of Systems  Constitutional: Negative for chills and fever.  HENT: Negative for congestion and sore throat.   Respiratory: Negative for cough and shortness of breath.   Cardiovascular: Negative for chest pain and palpitations.  Gastrointestinal: Positive for abdominal pain. Negative for constipation, diarrhea, nausea and vomiting.  All other systems reviewed and are negative.   VITAL SIGNS:  Temp:  [97.7 F (36.5 C)-98.8 F (37.1 C)] 98.8 F (37.1 C) (07/19 0459) Pulse Rate:  [69-96] 69 (07/19 0459) Resp:  [14-16] 16 (07/19  0459) BP: (89-117)/(53-80) 101/64 (07/19 0459) SpO2:  [90 %-97 %] 92 % (07/19 0459) Weight:  [90.7 kg-97.1 kg] 97.1 kg (07/18 2106)     Height: 5\' 11"  (180.3 cm) Weight: 97.1 kg BMI (Calculated): 29.87   INTAKE/OUTPUT:  07/18 0701 - 07/19 0700 In: 1575.9 [I.V.:575.9; IV Piggyback:1000] Out: 0   PHYSICAL EXAM:  Physical Exam Vitals and nursing note reviewed.  Constitutional:      General: He is not in acute distress.    Appearance: He is well-developed. He is obese. He is not ill-appearing.  HENT:     Head: Normocephalic and atraumatic.  Eyes:     General: No scleral icterus.  Extraocular Movements: Extraocular movements intact.  Cardiovascular:     Rate and Rhythm: Normal rate and regular rhythm.     Heart sounds: Normal heart sounds. No murmur heard.   Pulmonary:     Effort: Pulmonary effort is normal. No respiratory distress.     Breath sounds: Normal breath sounds.  Abdominal:     General: Abdomen is protuberant. There is no distension.     Palpations: Abdomen is soft.     Tenderness: There is abdominal tenderness (Mild) in the periumbilical area and left upper quadrant. There is no guarding or rebound.  Genitourinary:    Comments: Deferred Skin:    General: Skin is warm and dry.     Coloration: Skin is not jaundiced.     Findings: No erythema.  Neurological:     General: No focal deficit present.     Mental Status: He is alert and oriented to person, place, and time.  Psychiatric:        Mood and Affect: Mood normal.        Behavior: Behavior normal.      Labs:  CBC Latest Ref Rng & Units 10/14/2019 10/13/2019 11/01/2017  WBC 4.0 - 10.5 K/uL 6.7 10.4 6.4  Hemoglobin 13.0 - 17.0 g/dL 14.5 16.1 15.5  Hematocrit 39 - 52 % 43.1 49.0 43.6  Platelets 150 - 400 K/uL 151 194 189   CMP Latest Ref Rng & Units 10/14/2019 10/13/2019 06/13/2019  Glucose 70 - 99 mg/dL 96 123(H) 98  BUN 6 - 20 mg/dL 7 9 8   Creatinine 0.61 - 1.24 mg/dL 0.83 0.78 0.81  Sodium 135 - 145 mmol/L  141 142 142  Potassium 3.5 - 5.1 mmol/L 5.1 3.8 4.0  Chloride 98 - 111 mmol/L 109 109 109  CO2 22 - 32 mmol/L 29 24 29   Calcium 8.9 - 10.3 mg/dL 8.1(L) 8.9 9.2  Total Protein 6.5 - 8.1 g/dL - 7.3 7.2  Total Bilirubin 0.3 - 1.2 mg/dL - 1.8(H) 1.1  Alkaline Phos 38 - 126 U/L - 72 72  AST 15 - 41 U/L - 19 24  ALT 0 - 44 U/L - 26 29    Imaging studies:   CT Abdomen/Pelvis (10/13/2019) personally reviewed showing a few loops of dilated small bowel, question ileus vs enteritis, and radiologist report reviewed:  IMPRESSION: 1. Mildly thickened and borderline distended loops of small bowel in the midline abdomen with gradual tapering proximal and distal to transitions. Some increased vascularity of the adjacent mesentery with some reactive adenopathy and early hazy mesenteric changes. Findings are concerning for an early or partial small bowel obstruction or focal ileus, possibly related to an enteritis. 2. Some minimal focal stranding and questionable nodularity of the adjacent omentum is nonspecific and possibly related to the adjacent bowel process though an omental fat infarct or other omental process could present similarly. Metastatic nodularity is significantly less favored in the absence of known malignancy. 3. Progressive sclerotic changes and articular surface collapse of the bilateral femoral heads with associated moderate to severe arthrosis of the bilateral hips likely reflecting progression of avascular necrosis. 4. Hepatic steatosis. 5. Gallbladder contracted with several gallstones at the neck of the gallbladder or proximal cystic duct without evidence of acute cholecystitis. 6. Borderline enlarged prostate with indentation of the bladder base. Correlate for symptoms of outlet obstruction.   KUB (10/14/2019) personally reviewed showing persistent dilated loops of small bowel, no colonic gas, and radiologist report reviewed:  IMPRESSION: Mildly dilated small bowel  loops with  air-fluid levels is concerning for obstruction versus ileus.  Cholelithiasis.  Bilateral hip avascular necrosis.   Assessment/Plan: (ICD-10's: K61.7) 54 y.o. male with abdominal discomfort, distension, and decreased bowel function concerning for possible ileus -vs- enteritis, small bowel obstruction less likely given lack of previous surgery, complicated by pertinent comorbidities including history of myotonic dystrophy.   - Remain NPO with morning; okay for sips of water/ice  - Continue IVF resuscitation  - Monitor abdominal examination +/- serial KUBs; on-going bowel function    - Pain control prn; antiemetics prn  - No need for emergent surgical intervention  - Consider discussion with GI whether or not myotonic dystrophy could be contributing to this   - further management primary service     All of the above findings and recommendations were discussed with the patient, and all of patient's questions were answered to his expressed satisfaction.  Thank you for the opportunity to participate in this patient's care.   -- Edison Simon, PA-C [facility] 10/14/2019, 7:34 AM [phone] M-F: 7am - 4pm

## 2019-10-14 NOTE — Consult Note (Signed)
He is [place], MD [address]  [place]  [place],  [address]  Main: [phone]  Fax: [phone] Pager: [phone]   Consultation  Referring Provider:     No ref. provider found Primary Care Physician:  Leone Haven, MD Primary Gastroenterologist:  Dr. Vicente Males         Reason for Consultation:     Enteritis, SBO  Date of Admission:  10/13/2019 Date of Consultation:  10/14/2019         HPI:   James [name] is a 54 y.o. male history of myotonic dystrophy, presented with generalized abdominal pain and distention, nausea, nonbloody loose stools yesterday.  He generally has regular bowel movements, last one was on Thursday.  He reports intermittent episodes of constipation for which he takes stool softener or MiraLAX as needed only.  Patient's wife who is bedside also reports that he may have intermittent loose stools and abdominal distention.  He is pretty much sedentary, limited ambulation.  Denies any contaminated food or drinks prior to this episode.  Denies any rectal bleeding.  He was hemodynamically stable in the ER, CBC and CMP were unremarkable.  Subsequently, underwent CT abdomen and pelvis with contrast revealed mildly thickened and borderline distended loops of small bowel in the midline abdomen with gradual tapering proximal and distal to transitions. Some increased vascularity of the adjacent mesentery with some reactive adenopathy and early hazy mesenteric changes. Findings are concerning for an early or partial small bowel obstruction or focal ileus, possibly related to an enteritis.  Surgery was consulted who recommended conservative management until bowel function resumes and GI is consulted regarding input for prokinetic agents.  Patient had soft brown bowel movement in the evening.  He does report abdominal distention as well as mild abdominal discomfort Patient's wife is bedside  NSAIDs: None  Antiplts/Anticoagulants/Anti thrombotics:  None  GI Procedures:  Colonoscopy 04/04/2017 - One 3 mm polyp in the transverse colon, removed with a cold biopsy forceps. Resected and retrieved. - Two 3 to 5 mm polyps in the sigmoid colon, removed with a cold biopsy forceps. Resected and retrieved. - The examination was otherwise normal on direct and retroflexion views.  DIAGNOSIS:  A. COLON POLYP, TRANSVERSE; COLD BIOPSY:  - NONSPECIFIC CRYPT HYPERPLASIA, EDEMA, AND LYMPHOID AGGREGATE.  - NEGATIVE FOR DYSPLASIA AND MALIGNANCY (ADDITIONAL DEEPER SECTIONS  REVIEWED).   B. COLON POLYP X 2, SIGMOID AT 35 CM; COLD BIOPSY:  - SESSILE SERRATED ADENOMA, NEGATIVE FOR CYTOLOGIC DYSPLASIA AND  MALIGNANCY.  - HYPERPLASTIC POLYP, NEGATIVE FOR DYSPLASIA AND MALIGNANCY.   Colonoscopy 08/16/2016  - Four 5 to 7 mm polyps in the rectum, in the sigmoid colon and in the cecum, removed with a cold snare. Resected and retrieved. - One 20 mm polyp in the sigmoid colon, removed with mucosal resection. Resected and retrieved. Clips were placed. - The examination was otherwise normal on direct and retroflexion views.  DIAGNOSIS:  A. COLON POLYP, CECUM; COLD SNARE:  - SESSILE SERRATED ADENOMA.  - NEGATIVE FOR HIGH-GRADE DYSPLASIA AND MALIGNANCY.   B. 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  - TUBULAR ADENOMA, CAUTERIZED BASE FREE OF DYSPLASIA.  - NEGATIVE FOR HIGH-GRADE DYSPLASIA AND MALIGNANCY.   C. RECTUM POLYPS 2; COLD SNARE:  - HYPERPLASTIC POLYP (2).  - NEGATIVE FOR DYSPLASIA AND MALIGNANCY.  Past Medical History:  Diagnosis Date  . Bronchitis   . Cardiomyopathy ([place])   .  Dysphagia   . GERD (gastroesophageal reflux disease)   . History of chicken pox   . History of pneumonia 2013   [facility] PNA at [facility]  . LBBB (left bundle branch block)   . Myotonic dystrophy, type 1 ([place])    followed by [facility]  . Pneumonia    aspiration  . Sleep apnea     Past Surgical History:  Procedure  Laterality Date  . CARDIAC DEFIBRILLATOR PLACEMENT     defib/pacer  . CARDIAC DEFIBRILLATOR PLACEMENT    . COLONOSCOPY WITH PROPOFOL N/A 08/16/2016   Procedure: COLONOSCOPY WITH PROPOFOL;  Surgeon: Jonathon Bellows, MD;  Location: [facility] ENDOSCOPY;  Service: Gastroenterology;  Laterality: N/A;  . COLONOSCOPY WITH PROPOFOL N/A 04/04/2017   Procedure: COLONOSCOPY WITH PROPOFOL;  Surgeon: Jonathon Bellows, MD;  Location: [facility] ENDOSCOPY;  Service: Gastroenterology;  Laterality: N/A;  . PACEMAKER IMPLANT    . US ECHOCARDIOGRAPHY  09/2010   EF 50%, mld LVH and dysfunction, mild RV dysfunction and enlarged    Prior to Admission medications   Medication Sig Start Date End Date Taking? Authorizing Provider  lisinopril (PRINIVIL,ZESTRIL) 5 MG tablet Take 2.5 mg by mouth 2 (two) times daily.    Yes [provider]  Multiple Vitamin (MULTIVITAMIN) tablet Take 1 tablet by mouth daily.   Yes [provider]  albuterol (PROVENTIL HFA;VENTOLIN HFA) 108 (90 Base) MCG/ACT inhaler Inhale 2 puffs into the lungs every 6 (six) hours as needed for wheezing or shortness of breath. Patient not taking: Reported on 10/13/2019 03/27/17   Laban Emperor, PA-C  albuterol (PROVENTIL) (2.5 MG/3ML) 0.083% nebulizer solution Take 3 mLs (2.5 mg total) by nebulization every 6 (six) hours as needed for wheezing or shortness of breath. 03/31/17   Leone Haven, MD  loratadine (CLARITIN) 10 MG tablet Take 1 tablet (10 mg total) by mouth daily. Patient not taking: Reported on 10/13/2019 04/13/15   Leone Haven, MD   Current Facility-Administered Medications:  .  0.9 %  sodium chloride infusion, , Intravenous, Continuous, Mansy, Jan A, MD, Last Rate: 100 mL/hr at 10/14/19 1514, Rate Verify at 10/14/19 1514 .  acetaminophen (TYLENOL) tablet 650 mg, 650 mg, Oral, Q6H PRN **OR** acetaminophen (TYLENOL) suppository 650 mg, 650 mg, Rectal, Q6H PRN, Mansy, Jan A, MD .  albuterol (PROVENTIL) (2.5 MG/3ML) 0.083% nebulizer solution  2.5 mg, 2.5 mg, Nebulization, Q6H PRN, Mansy, Jan A, MD .  enoxaparin (LOVENOX) injection 40 mg, 40 mg, Subcutaneous, Q24H, Mansy, Jan A, MD, 40 mg at 10/13/19 2238 .  ketorolac (TORADOL) 15 MG/ML injection 15 mg, 15 mg, Intravenous, Q6H PRN, Mansy, Jan A, MD, 15 mg at 10/14/19 1102 .  lisinopril (ZESTRIL) tablet 5 mg, 5 mg, Oral, Daily, Mansy, Jan A, MD .  loratadine (CLARITIN) tablet 10 mg, 10 mg, Oral, Daily, Mansy, Jan A, MD .  magnesium hydroxide (MILK OF MAGNESIA) suspension 30 mL, 30 mL, Oral, Daily PRN, Mansy, Jan A, MD .  morphine 2 MG/ML injection 2 mg, 2 mg, Intravenous, Q4H PRN, Mansy, Jan A, MD .  multivitamin with minerals tablet 1 tablet, 1 tablet, Oral, Daily, Mansy, Jan A, MD .  ondansetron (ZOFRAN) tablet 4 mg, 4 mg, Oral, Q6H PRN **OR** ondansetron (ZOFRAN) injection 4 mg, 4 mg, Intravenous, Q6H PRN, Mansy, Jan A, MD .  pantoprazole (PROTONIX) injection 40 mg, 40 mg, Intravenous, Q24H, Karleen Hampshire, Vijaya, MD, 40 mg at 10/14/19 1512 .  traZODone (DESYREL) tablet 25 mg, 25 mg, Oral, QHS PRN, Mansy, Arvella Merles, MD  Family History  Problem Relation Age of Onset  . Hypertension Father   . Cancer Paternal Grandfather        lung, smoker  . Cancer Paternal Uncle        bone  . Diabetes Mother   . CAD Mother 92       MI  . Stroke Neg Hx      Social History   Tobacco Use  . Smoking status: Never Smoker  . Smokeless tobacco: Never Used  Vaping Use  . Vaping Use: Never used  Substance Use Topics  . Alcohol use: No  . Drug use: No    Allergies as of 10/13/2019 - Review Complete 10/13/2019  Allergen Reaction Noted  . Codeine Nausea Only 05/31/2013    Review of Systems:    All systems reviewed and negative except where noted in HPI.   Physical Exam:  Vital signs in last 24 hours: Temp:  [97.6 F (36.4 C)-98.8 F (37.1 C)] 97.6 F (36.4 C) (07/19 1922) Pulse Rate:  [66-84] 71 (07/19 1922) Resp:  [14-16] 15 (07/19 1922) BP: (89-110)/(53-71) 92/59 (07/19 1922) SpO2:   [90 %-95 %] 92 % (07/19 1922) Weight:  [97.1 kg] 97.1 kg (07/18 2106)   General:   Pleasant, cooperative in NAD Head:  Normocephalic and atraumatic. Eyes:   No icterus.   Conjunctiva pink. PERRLA. Ears:  Normal auditory acuity. Neck:  Supple; no masses or thyroidomegaly Lungs: Respirations even and unlabored. Lungs clear to auscultation bilaterally.   No wheezes, crackles, or rhonchi.  Heart:  Regular rate and rhythm;  Without murmur, clicks, rubs or gallops Abdomen:  Soft, severely distended, tympanic to percussion, nontender. Normal bowel sounds. No appreciable masses or hepatomegaly.  No rebound or guarding.  Rectal:  Not performed. Msk:  Symmetrical without gross deformities.  Strength weakness Extremities:  Without edema, cyanosis or clubbing. Neurologic:  Alert and oriented x3;  grossly normal neurologically. Skin:  Intact without significant lesions or rashes. Psych:  Alert and cooperative. Normal affect.  LAB RESULTS: CBC Latest Ref Rng & Units 10/14/2019 10/13/2019 11/01/2017  WBC 4.0 - 10.5 K/uL 6.7 10.4 6.4  Hemoglobin 13.0 - 17.0 g/dL 14.5 16.1 15.5  Hematocrit 39 - 52 % 43.1 49.0 43.6  Platelets 150 - 400 K/uL 151 194 189    BMET BMP Latest Ref Rng & Units 10/14/2019 10/13/2019 06/13/2019  Glucose 70 - 99 mg/dL 96 123(H) 98  BUN 6 - 20 mg/dL 7 9 8   Creatinine 0.61 - 1.24 mg/dL 0.83 0.78 0.81  Sodium 135 - 145 mmol/L 141 142 142  Potassium 3.5 - 5.1 mmol/L 5.1 3.8 4.0  Chloride 98 - 111 mmol/L 109 109 109  CO2 22 - 32 mmol/L 29 24 29   Calcium 8.9 - 10.3 mg/dL 8.1(L) 8.9 9.2    LFT Hepatic Function Latest Ref Rng & Units 10/13/2019 06/13/2019 11/17/2017  Total Protein 6.5 - 8.1 g/dL 7.3 7.2 6.8  Albumin 3.5 - 5.0 g/dL 3.8 4.0 4.3  AST 15 - 41 U/L 19 24 23   ALT 0 - 44 U/L 26 29 42  Alk Phosphatase 38 - 126 U/L 72 72 83  Total Bilirubin 0.3 - 1.2 mg/dL 1.8(H) 1.1 1.1  Bilirubin, Direct 0.00 - 0.40 mg/dL - - 0.24     STUDIES: CT ABDOMEN PELVIS W CONTRAST  Result  Date: 10/13/2019 CLINICAL DATA:  Abdominal distension, mid abdominal pain, diarrhea yesterday, now unable to pass flatus EXAM: CT ABDOMEN AND PELVIS WITH CONTRAST TECHNIQUE: Multidetector CT imaging  of the abdomen and pelvis was performed using the standard protocol following bolus administration of intravenous contrast. CONTRAST:  142m OMNIPAQUE IOHEXOL 300 MG/ML  SOLN COMPARISON:  CT 11/01/2017 FINDINGS: Lower chest: Bandlike areas of opacity in the lung bases likely reflecting subsegmental atelectatic change with additional dependent atelectasis posteriorly. Hepatobiliary: Diffuse hepatic hypoattenuation compatible with hepatic steatosis. No worrisome focal liver lesions. Smooth liver surface contour. The gallbladder appears completely decompressed with several calcifications, likely gallstones at the expected level of the cystic duct/gallbladder neck. No other intraductal gallstones are seen. No intra or extrahepatic biliary ductal dilatation. Pancreas: Partial fatty replacement of the pancreas. No pancreatic ductal dilatation or surrounding inflammatory changes. Spleen: Normal in size without focal abnormality. Adrenals/Urinary Tract: Normal adrenal glands. Kidneys are normally located with symmetric enhancement and excretion. Simple appearing, exophytic fluid attenuation cyst measuring 3.9 cm, minimally increased in size from prior and arising from the upper pole right kidney. Additional subcentimeter hypertension foci in both kidneys too small to fully characterize on CT imaging but statistically likely benign. No suspicious renal lesion, urolithiasis or hydronephrosis. Indentation of bladder base by a borderline enlarged prostate. No other gross bladder abnormality. Stomach/Bowel: Distal esophagus and stomach are unremarkable. Mildly thickened and borderline distended loops of small bowel are seen in the midline abdomen with gradual tapering proximal and distal to transitions (2/57, 2/68). Some increased  vascularity of the adjacent mesentery with some reactive adenopathy and early hazy mesenteric changes. More distal small bowel is largely decompressed. Small amount of fluid throughout the proximal colon with some minimal formed stool at the level of the rectosigmoid. No colonic dilatation or wall thickening. A normal appendix is visualized. Vascular/Lymphatic: No significant vascular findings are present. No enlarged abdominal or pelvic lymph nodes. Reproductive: Borderline enlarged prostate with indentation of the bladder base. No concerning focal lesions. Other: Some minimal focal stranding and nodularity of the omentum (2/55-56) is nonspecific and possibly related to the adjacent bowel process though an omental fat infarct or other omental process could present similarly. No abdominopelvic free air. Small amount of fluid in the mesenteric leaflets associated with the distended bowel segments. Musculoskeletal: Progressive sclerotic changes and articular surface collapse of the bilateral femoral heads with associated moderate to severe arthrosis of the bilateral hips likely reflecting progression of avascular necrosis. No other acute or suspicious osseous abnormalities. Multilevel degenerative changes are present in the imaged portions of the spine. IMPRESSION: 1. Mildly thickened and borderline distended loops of small bowel in the midline abdomen with gradual tapering proximal and distal to transitions. Some increased vascularity of the adjacent mesentery with some reactive adenopathy and early hazy mesenteric changes. Findings are concerning for an early or partial small bowel obstruction or focal ileus, possibly related to an enteritis. 2. Some minimal focal stranding and questionable nodularity of the adjacent omentum is nonspecific and possibly related to the adjacent bowel process though an omental fat infarct or other omental process could present similarly. Metastatic nodularity is significantly less  favored in the absence of known malignancy. 3. Progressive sclerotic changes and articular surface collapse of the bilateral femoral heads with associated moderate to severe arthrosis of the bilateral hips likely reflecting progression of avascular necrosis. 4. Hepatic steatosis. 5. Gallbladder contracted with several gallstones at the neck of the gallbladder or proximal cystic duct without evidence of acute cholecystitis. 6. Borderline enlarged prostate with indentation of the bladder base. Correlate for symptoms of outlet obstruction. Electronically Signed   By: PLovena LeM.D.   On: 10/13/2019 19:01  DG Abd 2 Views  Result Date: 10/14/2019 CLINICAL DATA:  SBO EXAM: ABDOMEN - 2 VIEW COMPARISON:  10/13/2019 CT abdomen pelvis. FINDINGS: Prominent to mildly dilated gas-filled small bowel loops with right hemiabdomen air-fluid levels. Rounded right upper quadrant calcific densities likely reflect gallstones. No evidence of pneumoperitoneum. Contrast opacified urinary bladder. Sequela of bilateral hip avascular necrosis. Partially imaged pacing lead. IMPRESSION: Mildly dilated small bowel loops with air-fluid levels is concerning for obstruction versus ileus. Cholelithiasis. Bilateral hip avascular necrosis. Electronically Signed   By: Primitivo Gauze M.D.   On: 10/14/2019 08:40      Impression / Plan:   James [name] is a 55 y.o. male with history of myotonic dystrophy who presented with 1 day history of mid abdominal pain, cramps and loose stools, nonbloody and CT revealed enteritis with possible evolving or partial SBO or ileus  Enteritis with possible SBO: Conservative management for now Clear liquids today since patient had a bowel movement Advance diet tomorrow if clinically improving Adequate IV hydration With no evidence of leukocytosis, recommend to discontinue antibiotics Recommend stool studies to rule out infection as patient is having bowel movements If there is no evidence of  infection, recommend empiric trial of rifaximin 550 mg p.o. 3 times daily for 2 weeks for possible bacterial overgrowth Advised to stay on regular bowel regimen such as MiraLAX as outpatient to avoid intermittent constipation   Thank you for involving me in the care of this patient.  GI will follow along with you    LOS: 0 days   Sherri Sear, MD  10/14/2019, 7:46 PM   Note: This dictation was prepared with Dragon dictation along with smaller phrase technology. Any transcriptional errors that result from this process are unintentional.

## 2019-10-15 DIAGNOSIS — I429 Cardiomyopathy, unspecified: Secondary | ICD-10-CM

## 2019-10-15 DIAGNOSIS — K56609 Unspecified intestinal obstruction, unspecified as to partial versus complete obstruction: Secondary | ICD-10-CM

## 2019-10-15 DIAGNOSIS — M87059 Idiopathic aseptic necrosis of unspecified femur: Secondary | ICD-10-CM

## 2019-10-15 DIAGNOSIS — K567 Ileus, unspecified: Secondary | ICD-10-CM

## 2019-10-15 DIAGNOSIS — Z95810 Presence of automatic (implantable) cardiac defibrillator: Secondary | ICD-10-CM

## 2019-10-15 MED ORDER — RIFAXIMIN 550 MG PO TABS
550.0000 mg | ORAL_TABLET | Freq: Three times a day (TID) | ORAL | Status: DC
  Administered 2019-10-15 – 2019-10-16 (×5): 550 mg via ORAL
  Filled 2019-10-15 (×6): qty 1

## 2019-10-15 NOTE — Progress Notes (Signed)
[facility] SURGICAL PROGRESS NOTE (cpt [phone])  Hospital Day(s): 1.   Interval History: Patient seen and examined, no acute events or new complaints overnight. Patient reports he is "feeling much better" this morning, denies fever, chills, nausea, emesis, or abdominal pain. Stool studies obtained at recommendation of GI which were negative. He has since had return of bowel function with multiple BMs. He has tolerated diet advancement to CLD.   Review of Systems:  Constitutional: denies fever, chills  HEENT: denies cough or congestion  Respiratory: denies any shortness of breath  Cardiovascular: denies chest pain or palpitations  Gastrointestinal: denies abdominal pain, N/V, or diarrhea/and bowel function as per interval history Genitourinary: denies burning with urination or urinary frequency   Vital signs in last 24 hours: [min-max] current  Temp:  [97.6 F (36.4 C)-98 F (36.7 C)] 97.6 F (36.4 C) (07/19 1922) Pulse Rate:  [66-71] 71 (07/19 1922) Resp:  [15-16] 15 (07/19 1922) BP: (92-98)/(59-65) 92/59 (07/19 1922) SpO2:  [91 %-92 %] 92 % (07/19 1922)     Height: 5\' 11"  (180.3 cm) Weight: 97.1 kg BMI (Calculated): 29.87   Intake/Output last 2 shifts:  07/19 0701 - 07/20 0700 In: 2160.9 [I.V.:2160.9] Out: 400 [Urine:400]   Physical Exam:  Constitutional: alert, cooperative and no distress  HENT: normocephalic without obvious abnormality  Eyes: PERRL, EOM's grossly intact and symmetric  Respiratory: breathing non-labored at rest  Cardiovascular: regular rate and sinus rhythm  Gastrointestinal: Soft, non-tender, and non-distended, No rebound/guarding   Labs:  CBC Latest Ref Rng & Units 10/14/2019 10/13/2019 11/01/2017  WBC 4.0 - 10.5 K/uL 6.7 10.4 6.4  Hemoglobin 13.0 - 17.0 g/dL 14.5 16.1 15.5  Hematocrit 39 - 52 % 43.1 49.0 43.6  Platelets 150 - 400 K/uL 151 194 189   CMP Latest Ref Rng & Units 10/14/2019 10/13/2019 06/13/2019  Glucose 70 - 99 mg/dL 96  123(H) 98  BUN 6 - 20 mg/dL 7 9 8   Creatinine 0.61 - 1.24 mg/dL 0.83 0.78 0.81  Sodium 135 - 145 mmol/L 141 142 142  Potassium 3.5 - 5.1 mmol/L 5.1 3.8 4.0  Chloride 98 - 111 mmol/L 109 109 109  CO2 22 - 32 mmol/L 29 24 29   Calcium 8.9 - 10.3 mg/dL 8.1(L) 8.9 9.2  Total Protein 6.5 - 8.1 g/dL - 7.3 7.2  Total Bilirubin 0.3 - 1.2 mg/dL - 1.8(H) 1.1  Alkaline Phos 38 - 126 U/L - 72 72  AST 15 - 41 U/L - 19 24  ALT 0 - 44 U/L - 26 29     Imaging studies: No new pertinent imaging studies   Assessment/Plan: (ICD-10's: K88.7) 54 y.o. male with resolution of abdominal pain/nausea and return of bowel function with resolving likely enteritis vs ileus, true bowel obstruction less likely given lact of previous surgeries, complicated by pertinent comorbidities including history of myotonic dystrophy.   - I advanced him to full liquids this morning; if he tolerates this well then I am okay with advancing to soft or regular diet this afternoon  - Discontinue IVF as diet advances  - Monitor abdominal examination +/- serial KUBs; on-going bowel function               - Pain control prn; antiemetics prn             - No need for emergent surgical intervention             - Appreciate GI recommendations and assistance             -  further management primary service     - No further general surgery issues; we will sign off  All of the above findings and recommendations were discussed with the patient, and the medical team, and all of patient's questions were answered to his expressed satisfaction.  -- Edison Simon, PA-C [facility] 10/15/2019, 7:35 AM [phone] M-F: 7am - 4pm

## 2019-10-15 NOTE — Evaluation (Signed)
Physical Therapy Evaluation Patient Details Name: [place] [name] MRN: [id number] DOB: 02-10-1966 Today's Date: 10/15/2019   History of Present Illness  54 year old male with prior history of myotonic dystrophy, cardiomyopathy s/p AICD, GERD, sleep apnea presented to ED with abdominal pain associated with some nausea.  No vomiting, further work up reveals partial small bowel obstruction.  Clinical Impression  Pt slow to respond to most cues/instructions, but was ultimately able to circumambulate around the nurses' station [place], [facility], hand rail always used).  Apparently he is walking near his baseline, but he clearly had some issues with quality of motion/foot placement/quad strength t/o the effort that caused some general unsteadiness and necessitated the need for some UE support t/o the effort.  He reports he has a stationary bike at home and is not convinced the he wants/needs HHPT, regardless PT recommends this and did educate pt/father about the benefits.       Follow Up Recommendations Home health PT;Supervision - Intermittent    Equipment Recommendations   (has old RW, per pt it's old and worn out)    Recommendations for Other Services       Precautions / Restrictions Precautions Precautions: None Restrictions Weight Bearing Restrictions: No      Mobility  Bed Mobility Overal bed mobility: Needs Assistance Bed Mobility: Supine to Sit;Sit to Supine     Supine to sit: Min guard;Min assist Sit to supine: Min guard;Min assist   General bed mobility comments: [name] father present and quick to assist, pt was able to do much of the effort but did seem to need/expect the help  Transfers Overall transfer level: Needs assistance Equipment used: Rolling walker (2 wheeled) Transfers: Sit to/from Stand Sit to Stand: Min guard         General transfer comment: again pt's father quick to jump in, pt seemed to be able to rise w/o direct assist - CGA only from  PT  Ambulation/Gait Ambulation/Gait assistance: Min guard Gait Distance (Feet): 200 Feet Assistive device: Rolling walker (2 wheeled);Straight cane       General Gait Details: Pt was able to walk ~150 ft with FWW, ~25 ft with single hallway rail and ~25 ft with SPC.  He had some fatigue but no overt LOBs.  He did display decreased quad control and quality of motion with foot placement.  Seemed to need to rest on "hyper" extended knees to keep from buckling t/o much of the ambulation effort.   Stairs            Wheelchair Mobility    Modified Rankin (Stroke Patients Only)       Balance Overall balance assessment: Needs assistance Sitting-balance support: Bilateral upper extremity supported Sitting balance-Leahy Scale: Fair Sitting balance - Comments: Pt with no LOBs while sitting EOB     Standing balance-Leahy Scale: Fair Standing balance comment: Pt reliant on fully extended knees 2/2 apparently quad weakness.  No overt LOBs but clearly reliant on UE/AD during standing acts                             Pertinent Vitals/Pain Pain Assessment: No/denies pain    Home Living Family/patient expects to be discharged to:: Private residence Living Arrangements: Spouse/significant other Available Help at Discharge: Family   Home Access: Ramped entrance     Home Layout: One level Home Equipment: Environmental consultant - 2 wheels;Cane - single point;Shower seat;Grab bars - toilet;Grab bars - tub/shower;Hand held shower head;Wheelchair -  power;Electric scooter      Prior Function Level of Independence: Independent with assistive device(s)         Comments: Pt is able to get around in the home, toilet, etc but is not out of the house a lot     Hand Dominance        Extremity/Trunk Assessment   Upper Extremity Assessment Upper Extremity Assessment: Generalized weakness (poor b/l grip strength, grossly 3/5 otherwise)    Lower Extremity Assessment Lower Extremity  Assessment: Generalized weakness (grossly 3+/5 t/o )       Communication   Communication: No difficulties  Cognition Arousal/Alertness: Awake/alert Behavior During Therapy: Flat affect Overall Cognitive Status: Difficult to assess                                 General Comments: Pt slow to answer, father present and indicates that this is his baseline - able to interact but certainly not lavishly      General Comments      Exercises     Assessment/Plan    PT Assessment Patient needs continued PT services  PT Problem List Decreased strength;Decreased range of motion;Decreased activity tolerance;Decreased balance;Decreased mobility;Decreased coordination;Decreased cognition;Decreased knowledge of use of DME;Decreased safety awareness       PT Treatment Interventions DME instruction;Gait training;Stair training;Functional mobility training;Therapeutic activities;Therapeutic exercise;Balance training;Neuromuscular re-education;Patient/family education    PT Goals (Current goals can be found in the Care Plan section)  Acute Rehab PT Goals Patient Stated Goal: go home PT Goal Formulation: With patient Time For Goal Achievement: 10/29/19 Potential to Achieve Goals: Good    Frequency Min 2X/week   Barriers to discharge        Co-evaluation               AM-PAC PT "6 Clicks" Mobility  Outcome Measure Help needed turning from your back to your side while in a flat bed without using bedrails?: A Little Help needed moving from lying on your back to sitting on the side of a flat bed without using bedrails?: A Little Help needed moving to and from a bed to a chair (including a wheelchair)?: A Little Help needed standing up from a chair using your arms (e.g., wheelchair or bedside chair)?: A Little Help needed to walk in hospital room?: A Little Help needed climbing 3-5 steps with a railing? : A Lot 6 Click Score: 17    End of Session Equipment Utilized  During Treatment: Gait belt Activity Tolerance: Patient limited by fatigue Patient left: in chair;with call bell/phone within reach;with family/visitor present (on commode, nursing notified, father with pt) Nurse Communication: Mobility status PT Visit Diagnosis: Muscle weakness (generalized) (M62.81);Unsteadiness on feet (R26.81);Ataxic gait (R26.0)    Time: 3382-5053 PT Time Calculation (min) (ACUTE ONLY): 29 min   Charges:   PT Evaluation $PT Eval Low Complexity: 1 Low PT Treatments $Gait Training: 8-22 mins        [name], DPT 10/15/2019, 4:08 PM

## 2019-10-15 NOTE — Progress Notes (Signed)
PROGRESS NOTE    [place] [name]  WJX:[id number] DOB: 1965/08/09 DOA: 10/13/2019 PCP: Leone Haven, MD    Chief Complaint  Patient presents with  . Abdominal Pain  . Nausea    Brief Narrative:  54 year old gentleman with prior history of myotonic dystrophy, cardiomyopathy s/p AICD, GERD, sleep apnea presented to ED with abdominal pain associated with some nausea but without any vomiting.  Patient reports he had BM Thursday.  He underwent CT of the abdomen and pelvis showing distended loops of small bowel in the midline abdomen with gradual tapering proximal and distal to the transitions.  Was also found to have increased vascularity with some reactive adenopathy with early hazy and eccentric changes findings concerning for partial bowel obstruction or focal ileus. He was made NPO, gen surgery consulted. He started to improve, have bowel movements. He was started on clears and advanced to full liquid diet.  PT evaluation ordered.   Assessment & Plan:   Active Problems:   Cardiomyopathy ([place])   Obstructive sleep apnea   ICD (implantable cardioverter-defibrillator) in place   Avascular necrosis of hip, unspecified laterality (HCC)   Partial small bowel obstruction (HCC)   SBO (small bowel obstruction) (HCC)  Partial small bowel obstruction and enteritis Improving, pt had multiple bowel movements and able to tolerate clears today. His diet was advanced to full liquid diet today , and plan to transition to soft diet if able to tolerate without any nausea, vomiting, abd distention and abd pain.  Gi pathogen and C diff PCR is negative.  Pt is afebrile and wbc count wnl , hence IV zosyn discontinued.  ? bacterial overgrowth GI recommends rifaximin 550 mg TID for bacterial overgrowth.    PT evaluation ordered.    History of cardiomyopathy status post AICD Patient currently denies any chest pain or shortness of breath.   History of GERD On protonix 40 mg daily.     Bilateral  Hip avascular necrosis:  Incidental finding on the x rays.   Borderline BP parameters: Hold lisinopril for now.  If Bp improves, plan to resume on discharge.     DVT prophylaxis: Lovenox Code Status: Full code Family Communication: family at bedside.  Disposition:   Status is: Inpatient.   The patient will require care spanning > 2 midnights and should be moved to inpatient because: IV treatments appropriate due to intensity of illness or inability to take PO  Dispo: The patient is from: Home              Anticipated d/c is to: pending.               Anticipated d/c date is: 1 day              Patient currently is not medically stable to d/c.       Consultants:  General surgery.  Gastroenterology.    Procedures: none.    Antimicrobials: none.    Subjective: Multiple bowel movements.   Objective: Vitals:   10/14/19 0459 10/14/19 1506 10/14/19 1922 10/15/19 0832  BP: 101/64 98/65 (!) 92/59 101/60  Pulse: 69 66 71 66  Resp: 16 16 15 16   Temp: 98.8 F (37.1 C) 98 F (36.7 C) 97.6 F (36.4 C)   TempSrc: Oral Oral Oral   SpO2: 92% 91% 92% 94%  Weight:      Height:        Intake/Output Summary (Last 24 hours) at 10/15/2019 1141 Last data filed at 10/15/2019 2130 Gross  per 24 hour  Intake 2160.92 ml  Output 600 ml  Net 1560.92 ml   Filed Weights   10/13/19 1219 10/13/19 2106  Weight: 90.7 kg 97.1 kg    Examination:  General exam: alert , comfortable , not in distress.  Respiratory system: clear to auscultation, no wheezing or rhonchi.  Cardiovascular system: S1 S2 RRR, NO JVD,  Gastrointestinal system: Abdomen is soft, distention improved, no tenderness bowel sounds wnl.  Central nervous system: Alert and Oriented. Grossly non focal.  Extremities: no cyanosis or clubbing.  Skin: No Rashes  Psychiatry:  Mood is appropriate.      Data Reviewed: I have personally reviewed following labs and imaging studies  CBC: Recent Labs  Lab 10/13/19 1229  10/14/19 0540  WBC 10.4 6.7  HGB 16.1 14.5  HCT 49.0 43.1  MCV 96.5 95.8  PLT 194 449    Basic Metabolic Panel: Recent Labs  Lab 10/13/19 1229 10/14/19 0540  NA 142 141  K 3.8 5.1  CL 109 109  CO2 24 29  GLUCOSE 123* 96  BUN 9 7  CREATININE 0.78 0.83  CALCIUM 8.9 8.1*    GFR: Estimated Creatinine Clearance: 122.3 mL/min (by C-G formula based on SCr of 0.83 mg/dL).  Liver Function Tests: Recent Labs  Lab 10/13/19 1229  AST 19  ALT 26  ALKPHOS 72  BILITOT 1.8*  PROT 7.3  ALBUMIN 3.8    CBG: No results for input(s): GLUCAP in the last 168 hours.   Recent Results (from the past 240 hour(s))  SARS Coronavirus 2 by RT PCR (hospital order, performed in [facility] hospital lab) Nasopharyngeal Nasopharyngeal Swab     Status: None   Collection Time: 10/13/19  8:10 PM   Specimen: Nasopharyngeal Swab  Result Value Ref Range Status   SARS Coronavirus 2 NEGATIVE NEGATIVE Final    Comment: (NOTE) SARS-CoV-2 target nucleic acids are NOT DETECTED.  The SARS-CoV-2 RNA is generally detectable in upper and lower respiratory specimens during the acute phase of infection. The lowest concentration of SARS-CoV-2 viral copies this assay can detect is 250 copies / mL. A negative result does not preclude SARS-CoV-2 infection and should not be used as the sole basis for treatment or other patient management decisions.  A negative result may occur with improper specimen collection / handling, submission of specimen other than nasopharyngeal swab, presence of viral mutation(s) within the areas targeted by this assay, and inadequate number of viral copies (<250 copies / mL). A negative result must be combined with clinical observations, patient history, and epidemiological information.  Fact Sheet for Patients:   StrictlyIdeas.no  Fact Sheet for Healthcare Providers: BankingDealers.co.za  This test is not yet approved or  cleared  by the [place] FDA and has been authorized for detection and/or diagnosis of SARS-CoV-2 by FDA under an Emergency Use Authorization (EUA).  This EUA will remain in effect (meaning this test can be used) for the duration of the COVID-19 declaration under Section 564(b)(1) of the Act, 21 U.S.C. section 360bbb-3(b)(1), unless the authorization is terminated or revoked sooner.  Performed at [facility], [address] [place], [place] [address]   Gastrointestinal Panel by PCR , Stool     Status: None   Collection Time: 10/14/19  8:30 PM   Specimen: Stool  Result Value Ref Range Status   Campylobacter species NOT DETECTED NOT DETECTED Final   Plesimonas shigelloides NOT DETECTED NOT DETECTED Final   Salmonella species NOT DETECTED NOT DETECTED Final  Yersinia enterocolitica NOT DETECTED NOT DETECTED Final   Vibrio species NOT DETECTED NOT DETECTED Final   Vibrio cholerae NOT DETECTED NOT DETECTED Final   Enteroaggregative E coli (EAEC) NOT DETECTED NOT DETECTED Final   Enteropathogenic E coli (EPEC) NOT DETECTED NOT DETECTED Final   Enterotoxigenic E coli (ETEC) NOT DETECTED NOT DETECTED Final   Shiga like toxin producing E coli (STEC) NOT DETECTED NOT DETECTED Final   Shigella/Enteroinvasive E coli (EIEC) NOT DETECTED NOT DETECTED Final   Cryptosporidium NOT DETECTED NOT DETECTED Final   Cyclospora cayetanensis NOT DETECTED NOT DETECTED Final   Entamoeba histolytica NOT DETECTED NOT DETECTED Final   Giardia lamblia NOT DETECTED NOT DETECTED Final   Adenovirus F40/41 NOT DETECTED NOT DETECTED Final   Astrovirus NOT DETECTED NOT DETECTED Final   Norovirus GI/GII NOT DETECTED NOT DETECTED Final   Rotavirus A NOT DETECTED NOT DETECTED Final   Sapovirus (I, II, IV, and V) NOT DETECTED NOT DETECTED Final    Comment: Performed at [facility], [address] [place], [place] [address]  C Difficile Quick Screen w PCR reflex     Status: None   Collection Time:  10/14/19  8:30 PM   Specimen: STOOL  Result Value Ref Range Status   C Diff antigen NEGATIVE NEGATIVE Final   C Diff toxin NEGATIVE NEGATIVE Final    Comment: NEGATIVE   C Diff interpretation No C. difficile detected.  Final    Comment: Performed at [facility], [address] [place], [place] [address]         Radiology Studies: CT ABDOMEN PELVIS W CONTRAST  Result Date: 10/13/2019 CLINICAL DATA:  Abdominal distension, mid abdominal pain, diarrhea yesterday, now unable to pass flatus EXAM: CT ABDOMEN AND PELVIS WITH CONTRAST TECHNIQUE: Multidetector CT imaging of the abdomen and pelvis was performed using the standard protocol following bolus administration of intravenous contrast. CONTRAST:  177mL OMNIPAQUE IOHEXOL 300 MG/ML  SOLN COMPARISON:  CT 11/01/2017 FINDINGS: Lower chest: Bandlike areas of opacity in the lung bases likely reflecting subsegmental atelectatic change with additional dependent atelectasis posteriorly. Hepatobiliary: Diffuse hepatic hypoattenuation compatible with hepatic steatosis. No worrisome focal liver lesions. Smooth liver surface contour. The gallbladder appears completely decompressed with several calcifications, likely gallstones at the expected level of the cystic duct/gallbladder neck. No other intraductal gallstones are seen. No intra or extrahepatic biliary ductal dilatation. Pancreas: Partial fatty replacement of the pancreas. No pancreatic ductal dilatation or surrounding inflammatory changes. Spleen: Normal in size without focal abnormality. Adrenals/Urinary Tract: Normal adrenal glands. Kidneys are normally located with symmetric enhancement and excretion. Simple appearing, exophytic fluid attenuation cyst measuring 3.9 cm, minimally increased in size from prior and arising from the upper pole right kidney. Additional subcentimeter hypertension foci in both kidneys too small to fully characterize on CT imaging but statistically likely benign. No  suspicious renal lesion, urolithiasis or hydronephrosis. Indentation of bladder base by a borderline enlarged prostate. No other gross bladder abnormality. Stomach/Bowel: Distal esophagus and stomach are unremarkable. Mildly thickened and borderline distended loops of small bowel are seen in the midline abdomen with gradual tapering proximal and distal to transitions (2/57, 2/68). Some increased vascularity of the adjacent mesentery with some reactive adenopathy and early hazy mesenteric changes. More distal small bowel is largely decompressed. Small amount of fluid throughout the proximal colon with some minimal formed stool at the level of the rectosigmoid. No colonic dilatation or wall thickening. A normal appendix is visualized. Vascular/Lymphatic: No significant vascular findings are present.  No enlarged abdominal or pelvic lymph nodes. Reproductive: Borderline enlarged prostate with indentation of the bladder base. No concerning focal lesions. Other: Some minimal focal stranding and nodularity of the omentum (2/55-56) is nonspecific and possibly related to the adjacent bowel process though an omental fat infarct or other omental process could present similarly. No abdominopelvic free air. Small amount of fluid in the mesenteric leaflets associated with the distended bowel segments. Musculoskeletal: Progressive sclerotic changes and articular surface collapse of the bilateral femoral heads with associated moderate to severe arthrosis of the bilateral hips likely reflecting progression of avascular necrosis. No other acute or suspicious osseous abnormalities. Multilevel degenerative changes are present in the imaged portions of the spine. IMPRESSION: 1. Mildly thickened and borderline distended loops of small bowel in the midline abdomen with gradual tapering proximal and distal to transitions. Some increased vascularity of the adjacent mesentery with some reactive adenopathy and early hazy mesenteric changes.  Findings are concerning for an early or partial small bowel obstruction or focal ileus, possibly related to an enteritis. 2. Some minimal focal stranding and questionable nodularity of the adjacent omentum is nonspecific and possibly related to the adjacent bowel process though an omental fat infarct or other omental process could present similarly. Metastatic nodularity is significantly less favored in the absence of known malignancy. 3. Progressive sclerotic changes and articular surface collapse of the bilateral femoral heads with associated moderate to severe arthrosis of the bilateral hips likely reflecting progression of avascular necrosis. 4. Hepatic steatosis. 5. Gallbladder contracted with several gallstones at the neck of the gallbladder or proximal cystic duct without evidence of acute cholecystitis. 6. Borderline enlarged prostate with indentation of the bladder base. Correlate for symptoms of outlet obstruction. Electronically Signed   By: Lovena Le M.D.   On: 10/13/2019 19:01   DG Abd 2 Views  Result Date: 10/14/2019 CLINICAL DATA:  SBO EXAM: ABDOMEN - 2 VIEW COMPARISON:  10/13/2019 CT abdomen pelvis. FINDINGS: Prominent to mildly dilated gas-filled small bowel loops with right hemiabdomen air-fluid levels. Rounded right upper quadrant calcific densities likely reflect gallstones. No evidence of pneumoperitoneum. Contrast opacified urinary bladder. Sequela of bilateral hip avascular necrosis. Partially imaged pacing lead. IMPRESSION: Mildly dilated small bowel loops with air-fluid levels is concerning for obstruction versus ileus. Cholelithiasis. Bilateral hip avascular necrosis. Electronically Signed   By: Primitivo Gauze M.D.   On: 10/14/2019 08:40        Scheduled Meds: . enoxaparin (LOVENOX) injection  40 mg Subcutaneous Q24H  . lisinopril  5 mg Oral Daily  . loratadine  10 mg Oral Daily  . multivitamin with minerals  1 tablet Oral Daily  . pantoprazole (PROTONIX) IV  40 mg  Intravenous Q24H  . rifaximin  550 mg Oral TID   Continuous Infusions: . sodium chloride 100 mL/hr at 10/15/19 0513     LOS: 1 day        Hosie Poisson, MD [facility]   To contact the attending provider between 7A-7P or the covering provider during after hours 7P-7A, please log into the web site [email or web address] and access using universal [facility] password for that web site. If you do not have the password, please call the hospital operator.  10/15/2019, 11:41 AM

## 2019-10-16 DIAGNOSIS — G4733 Obstructive sleep apnea (adult) (pediatric): Secondary | ICD-10-CM

## 2019-10-16 LAB — BASIC METABOLIC PANEL
Anion gap: 5 (ref 5–15)
BUN: <5 mg/dL — ABNORMAL LOW (ref 6–20)
CO2: 28 mmol/L (ref 22–32)
Calcium: 8.3 mg/dL — ABNORMAL LOW (ref 8.9–10.3)
Chloride: 113 mmol/L — ABNORMAL HIGH (ref 98–111)
Creatinine, Ser: 0.73 mg/dL (ref 0.61–1.24)
GFR calc Af Amer: >60 mL/min (ref >60)
GFR calc non Af Amer: >60 mL/min (ref >60)
Glucose, Bld: 97 mg/dL (ref 70–99)
Potassium: 3.5 mmol/L (ref 3.5–5.1)
Sodium: 146 mmol/L — ABNORMAL HIGH (ref 135–145)

## 2019-10-16 MED ORDER — POLYETHYLENE GLYCOL 3350 17 G PO PACK: 17.0000 g | PACK | Freq: Every day | ORAL | Status: DC | PRN

## 2019-10-16 MED ORDER — RIFAXIMIN 550 MG PO TABS: 550.0000 mg | ORAL_TABLET | Freq: Three times a day (TID) | ORAL | 0 refills | Status: AC

## 2019-10-16 NOTE — Progress Notes (Addendum)
James Darby, MD [address]  [place]  [place], [place] [address]  Main: [phone]  Fax: [phone] Pager: [phone]   Subjective: Patient denies any complaints today.  He says he is ready to go home today.  He had 3 soft bowel movements yesterday.  His appetite is good.  He denies any abdominal pain, nausea or vomiting.  His stool studies are negative for infection   Objective: Vital signs in last 24 hours: Vitals:   10/15/19 0832 10/15/19 1405 10/15/19 1928 10/16/19 0400  BP: 101/60 101/65 119/77 107/70  Pulse: 66 84 88 66  Resp: 16 20    Temp:  97.9 F (36.6 C) (!) 97.5 F (36.4 C) (!) 97.5 F (36.4 C)  TempSrc:  Oral Oral Oral  SpO2: 94% 92% 94% 95%  Weight:      Height:       Weight change:   Intake/Output Summary (Last 24 hours) at 10/16/2019 1429 Last data filed at 10/16/2019 0644 Gross per 24 hour  Intake 1882.63 ml  Output 500 ml  Net 1382.63 ml     Exam: Heart:: Regular rate and rhythm, S1S2 present or without murmur or extra heart sounds Lungs: normal Abdomen: Soft, nontender, distended, tympanic to percussion   Lab Results: CBC Latest Ref Rng & Units 10/14/2019 10/13/2019 11/01/2017  WBC 4.0 - 10.5 K/uL 6.7 10.4 6.4  Hemoglobin 13.0 - 17.0 g/dL 14.5 16.1 15.5  Hematocrit 39 - 52 % 43.1 49.0 43.6  Platelets 150 - 400 K/uL 151 194 189   BMP Latest Ref Rng & Units 10/16/2019 10/14/2019 10/13/2019  Glucose 70 - 99 mg/dL 97 96 123(H)  BUN 6 - 20 mg/dL <5(L) 7 9  Creatinine 0.61 - 1.24 mg/dL 0.73 0.83 0.78  Sodium 135 - 145 mmol/L 146(H) 141 142  Potassium 3.5 - 5.1 mmol/L 3.5 5.1 3.8  Chloride 98 - 111 mmol/L 113(H) 109 109  CO2 22 - 32 mmol/L 28 29 24   Calcium 8.9 - 10.3 mg/dL 8.3(L) 8.1(L) 8.9    Micro Results: Recent Results (from the past 240 hour(s))  SARS Coronavirus 2 by RT PCR (hospital order, performed in [facility] hospital lab) Nasopharyngeal Nasopharyngeal Swab     Status: None   Collection Time: 10/13/19  8:10 PM    Specimen: Nasopharyngeal Swab  Result Value Ref Range Status   SARS Coronavirus 2 NEGATIVE NEGATIVE Final    Comment: (NOTE) SARS-CoV-2 target nucleic acids are NOT DETECTED.  The SARS-CoV-2 RNA is generally detectable in upper and lower respiratory specimens during the acute phase of infection. The lowest concentration of SARS-CoV-2 viral copies this assay can detect is 250 copies / mL. A negative result does not preclude SARS-CoV-2 infection and should not be used as the sole basis for treatment or other patient management decisions.  A negative result may occur with improper specimen collection / handling, submission of specimen other than nasopharyngeal swab, presence of viral mutation(s) within the areas targeted by this assay, and inadequate number of viral copies (<250 copies / mL). A negative result must be combined with clinical observations, patient history, and epidemiological information.  Fact Sheet for Patients:   StrictlyIdeas.no  Fact Sheet for Healthcare Providers: BankingDealers.co.za  This test is not yet approved or  cleared by the [place] FDA and has been authorized for detection and/or diagnosis of SARS-CoV-2 by FDA under an Emergency Use Authorization (EUA).  This EUA will remain in effect (meaning this test can be used) for the  duration of the COVID-19 declaration under Section 564(b)(1) of the Act, 21 U.S.C. section 360bbb-3(b)(1), unless the authorization is terminated or revoked sooner.  Performed at [facility], [address] [place], [place] [address]   Gastrointestinal Panel by PCR , Stool     Status: None   Collection Time: 10/14/19  8:30 PM   Specimen: Stool  Result Value Ref Range Status   Campylobacter species NOT DETECTED NOT DETECTED Final   Plesimonas shigelloides NOT DETECTED NOT DETECTED Final   Salmonella species NOT DETECTED NOT DETECTED Final   Yersinia enterocolitica  NOT DETECTED NOT DETECTED Final   Vibrio species NOT DETECTED NOT DETECTED Final   Vibrio cholerae NOT DETECTED NOT DETECTED Final   Enteroaggregative E coli (EAEC) NOT DETECTED NOT DETECTED Final   Enteropathogenic E coli (EPEC) NOT DETECTED NOT DETECTED Final   Enterotoxigenic E coli (ETEC) NOT DETECTED NOT DETECTED Final   Shiga like toxin producing E coli (STEC) NOT DETECTED NOT DETECTED Final   Shigella/Enteroinvasive E coli (EIEC) NOT DETECTED NOT DETECTED Final   Cryptosporidium NOT DETECTED NOT DETECTED Final   Cyclospora cayetanensis NOT DETECTED NOT DETECTED Final   Entamoeba histolytica NOT DETECTED NOT DETECTED Final   Giardia lamblia NOT DETECTED NOT DETECTED Final   Adenovirus F40/41 NOT DETECTED NOT DETECTED Final   Astrovirus NOT DETECTED NOT DETECTED Final   Norovirus GI/GII NOT DETECTED NOT DETECTED Final   Rotavirus A NOT DETECTED NOT DETECTED Final   Sapovirus (I, II, IV, and V) NOT DETECTED NOT DETECTED Final    Comment: Performed at [facility], [address] [place], [place] [address]  C Difficile Quick Screen w PCR reflex     Status: None   Collection Time: 10/14/19  8:30 PM   Specimen: STOOL  Result Value Ref Range Status   C Diff antigen NEGATIVE NEGATIVE Final   C Diff toxin NEGATIVE NEGATIVE Final    Comment: NEGATIVE   C Diff interpretation No C. difficile detected.  Final    Comment: Performed at [facility], [address] [place], [place] [address]   Studies/Results: No results found. Medications:  I have reviewed the patient's current medications. Prior to Admission:  Medications Prior to Admission  Medication Sig Dispense Refill Last Dose  . lisinopril (PRINIVIL,ZESTRIL) 5 MG tablet Take 2.5 mg by mouth 2 (two) times daily.    10/13/2019 at 0700  . Multiple Vitamin (MULTIVITAMIN) tablet Take 1 tablet by mouth daily.   10/13/2019 at 0700  . albuterol (PROVENTIL HFA;VENTOLIN HFA) 108 (90 Base) MCG/ACT inhaler Inhale 2 puffs  into the lungs every 6 (six) hours as needed for wheezing or shortness of breath. (Patient not taking: Reported on 10/13/2019) 1 Inhaler 0 Not Taking at Unknown time  . albuterol (PROVENTIL) (2.5 MG/3ML) 0.083% nebulizer solution Take 3 mLs (2.5 mg total) by nebulization every 6 (six) hours as needed for wheezing or shortness of breath. 150 mL 1 prn at prn  . loratadine (CLARITIN) 10 MG tablet Take 1 tablet (10 mg total) by mouth daily. (Patient not taking: Reported on 10/13/2019)   Not Taking at Unknown time   Scheduled: . enoxaparin (LOVENOX) injection  40 mg Subcutaneous Q24H  . loratadine  10 mg Oral Daily  . multivitamin with minerals  1 tablet Oral Daily  . pantoprazole (PROTONIX) IV  40 mg Intravenous Q24H  . rifaximin  550 mg Oral TID   Continuous:  DXI:[id number] **OR** acetaminophen, albuterol, ketorolac, magnesium hydroxide, morphine injection, ondansetron **OR**  ondansetron (ZOFRAN) IV, traZODone Anti-infectives (From admission, onward)   Start     Dose/Rate Route Frequency Ordered Stop   10/15/19 1145  rifaximin (XIFAXAN) tablet 550 mg     Discontinue     550 mg Oral 3 times daily 10/15/19 1139 10/29/19 0959   10/14/19 1430  piperacillin-tazobactam (ZOSYN) IVPB 3.375 g  Status:  Discontinued        3.375 g 12.5 mL/hr over 240 Minutes Intravenous Every 8 hours 10/14/19 1336 10/14/19 1708     Scheduled Meds: . enoxaparin (LOVENOX) injection  40 mg Subcutaneous Q24H  . loratadine  10 mg Oral Daily  . multivitamin with minerals  1 tablet Oral Daily  . pantoprazole (PROTONIX) IV  40 mg Intravenous Q24H  . rifaximin  550 mg Oral TID   Continuous Infusions: PRN Meds:.acetaminophen **OR** acetaminophen, albuterol, ketorolac, magnesium hydroxide, morphine injection, ondansetron **OR** ondansetron (ZOFRAN) IV, traZODone   Assessment: Active Problems:   Cardiomyopathy ([place])   Obstructive sleep apnea   ICD (implantable cardioverter-defibrillator) in place   Avascular necrosis  of hip, unspecified laterality (HCC)   Partial small bowel obstruction (HCC)   SBO (small bowel obstruction) ([place])  JIMIE KUWAHARA is a 54 y.o. male with history of myotonic dystrophy who presented with 1 day history of mid abdominal pain, cramps and loose stools, nonbloody and CT revealed enteritis with possible evolving or partial SBO or ileus  Plan: Enteritis, with possible SBO/ileus Symptoms have resolved, currently asymptomatic, patient is having good bowel movements Stool studies negative for infectious etiology Continue rifaximin 550 mg p.o. 3 times daily for 2 weeks for possible bacterial overgrowth Advised to stay on regular bowel regimen such as MiraLAX as outpatient to avoid intermittent constipation Recommend CT enterography or MR enterography or video capsule endoscopy if there is recurrence of symptoms Patient can be discharged home today with outpatient follow-up with Dr. Vicente Males in 4 to 6 weeks  GI will sign off at this time, please call us back with questions or concerns   LOS: 2 days   Julus Kelley 10/16/2019, 2:29 PM

## 2019-10-16 NOTE — TOC Transition Note (Signed)
Transition of Care [facility]) - CM/SW Discharge Note   Patient Details  Name: HUSAM HOHN MRN: [id number] Date of Birth: Sep 17, 1965  Transition of Care [facility]) CM/SW Contact:  Candie Chroman, LCSW Phone Number: 10/16/2019, 3:52 PM   Clinical Narrative:  Patient has orders to discharge home today. No further concerns. CSW signing off.   Final next level of care: Home/Self Care Barriers to Discharge: Barriers Resolved   Patient Goals and CMS Choice     Choice offered to / list presented to : NA  Discharge Placement                    Patient and family notified of of transfer: 10/16/19  Discharge Plan and Services                                     Social Determinants of Health (SDOH) Interventions     Readmission Risk Interventions No flowsheet data found.

## 2019-10-16 NOTE — Discharge Summary (Signed)
Physician Discharge Summary  James [name] LPF:[id number] DOB: 12-05-1965 DOA: 10/13/2019  PCP: James Haven, MD  Admit date: 10/13/2019 Discharge date: 10/16/2019  Admitted From: Home Disposition: Home  Recommendations for Outpatient Follow-up:  1. Follow up with PCP in 1 week 2. Follow up with Gastroenterology in 4 weeks 3. Repeat BMP in 1 week 4. Please follow up on the following pending results: None  Home Health: PT, OT Equipment/Devices: None  Discharge Condition: Stable CODE STATUS: Full code Diet recommendation: Heart healthy   Brief/Interim Summary:  Admission HPI written by James Mormon, MD   HISTORY OF PRESENT ILLNESS: James [name]  is a 54 y.o. male with a known history of myotonic dystrophy type I, cardiomyopathy status post AICD, sleep apnea, GERD and aspiration pneumonia, who presented to the emergency room with acute onset of nausea with associated abdominal pain without vomiting which have been worsening over the last couple of days.  He had diarrhea yesterday however his last regular bowel movement was on Thursday.  He denies any fever or chills.  No chest pain or dyspnea or cough or wheezing.  He has dyspnea on exertion and probably mild wheezing for which he uses albuterol inhaler and nebulizer.  No dysuria, oliguria or hematuria or flank pain.  No headache or dizziness or blurred vision.  Upon presentation to the emergency room, vital signs were within normal.  CBC and CMP were unremarkable except for total bili of 1.8. EKG showed atrial sensed ventricular paced rhythm with a rate of 94 abdominal and pelvic CT scan revealed the following: 1. Mildly thickened and borderline distended loops of small bowel in the midline abdomen with gradual tapering proximal and distal to transitions. Some increased vascularity of the adjacent mesentery with some reactive adenopathy and early hazy mesenteric changes. Findings are concerning for an early or partial small  bowel obstruction or focal ileus, possibly related to an enteritis. 2. Some minimal focal stranding and questionable nodularity of the adjacent omentum is nonspecific and possibly related to the adjacent bowel process though an omental fat infarct or other omental process could present similarly. Metastatic nodularity is significantly less favored in the absence of known malignancy. 3. Progressive sclerotic changes and articular surface collapse of the bilateral femoral heads with associated moderate to severe arthrosis of the bilateral hips likely reflecting progression of avascular necrosis. 4. Hepatic steatosis. 5. Gallbladder contracted with several gallstones at the neck of the gallbladder or proximal cystic duct without evidence of acute cholecystitis. 6. Borderline enlarged prostate with indentation of the bladder base.   Hospital course:  Partial small bowel obstruction CT abdomen/pelvis was concerning for partial bowel obstriction vs ileus in relation to enteritis. General surgery was consulted with recommendation for conservative management. Per general surgery, low concern for obstruction. Patient was initially NPO with gradual advancement of diet as his abdominal pain and bowel function returned. His diet was successfully advanced prior to discharge.  Enteritis Seen on CT imaging. Gastroenterology consulted for co-management. Stool studies were negative for GI pathogens including C. Difficile. GI started patient on Rifaximin 550 mg TID with recommendations for 2 weeks of treatment. Recommendation for CT enterography vs MR enterography vs video capsule endoscopy should symptoms recur. Patient will follow-up with primary gastroenterologist as an outpatient.  Cardiomyopathy, unspecified Noted on chart review. S/p AICD. No anginal symptoms.  Bilateral hip avascular necrosis Progression per CT abdomen/pelvis. Outpatient follow-up.  Essential hypertension Resume home  lisinopril   Discharge Diagnoses:  Active Problems:  Cardiomyopathy ([place])   Obstructive sleep apnea   ICD (implantable cardioverter-defibrillator) in place   Avascular necrosis of hip, unspecified laterality (HCC)   Partial small bowel obstruction (HCC)   SBO (small bowel obstruction) [facility])    Discharge Instructions  Discharge Instructions    Call MD for:  severe uncontrolled pain   Complete by: As directed    Call MD for:  temperature >100.4   Complete by: As directed    Increase activity slowly   Complete by: As directed      Allergies as of 10/16/2019      Reactions   Codeine Nausea Only      Medication List    TAKE these medications   albuterol 108 (90 Base) MCG/ACT inhaler Commonly known as: VENTOLIN HFA Inhale 2 puffs into the lungs every 6 (six) hours as needed for wheezing or shortness of breath.   albuterol (2.5 MG/3ML) 0.083% nebulizer solution Commonly known as: PROVENTIL Take 3 mLs (2.5 mg total) by nebulization every 6 (six) hours as needed for wheezing or shortness of breath.   lisinopril 5 MG tablet Commonly known as: ZESTRIL Take 2.5 mg by mouth 2 (two) times daily.   loratadine 10 MG tablet Commonly known as: CLARITIN Take 1 tablet (10 mg total) by mouth daily.   multivitamin tablet Take 1 tablet by mouth daily.   polyethylene glycol 17 g packet Commonly known as: MiraLax Mix-In Pax Take 17 g by mouth daily as needed for mild constipation.   rifaximin 550 MG Tabs tablet Commonly known as: XIFAXAN Take 1 tablet (550 mg total) by mouth 3 (three) times daily for 13 days.       Follow-up Information    James Haven, MD. Schedule an appointment as soon as possible for a visit in 1 week(s).   Specialty: Family Medicine Why: Hospital follow-up Contact information: [address] [place] [address] [phone]        James Bellows, MD. Schedule an appointment as soon as possible for a visit in 4 week(s).     Specialty: Gastroenterology Why: [place] information: [place] [address] [phone]              Allergies  Allergen Reactions  . Codeine Nausea Only    Consultations:  Gastroenterology   Procedures/Studies: CT ABDOMEN PELVIS W CONTRAST  Result Date: 10/13/2019 CLINICAL DATA:  Abdominal distension, mid abdominal pain, diarrhea yesterday, now unable to pass flatus EXAM: CT ABDOMEN AND PELVIS WITH CONTRAST TECHNIQUE: Multidetector CT imaging of the abdomen and pelvis was performed using the standard protocol following bolus administration of intravenous contrast. CONTRAST:  128mL OMNIPAQUE IOHEXOL 300 MG/ML  SOLN COMPARISON:  CT 11/01/2017 FINDINGS: Lower chest: Bandlike areas of opacity in the lung bases likely reflecting subsegmental atelectatic change with additional dependent atelectasis posteriorly. Hepatobiliary: Diffuse hepatic hypoattenuation compatible with hepatic steatosis. No worrisome focal liver lesions. Smooth liver surface contour. The gallbladder appears completely decompressed with several calcifications, likely gallstones at the expected level of the cystic duct/gallbladder neck. No other intraductal gallstones are seen. No intra or extrahepatic biliary ductal dilatation. Pancreas: Partial fatty replacement of the pancreas. No pancreatic ductal dilatation or surrounding inflammatory changes. Spleen: Normal in size without focal abnormality. Adrenals/Urinary Tract: Normal adrenal glands. Kidneys are normally located with symmetric enhancement and excretion. Simple appearing, exophytic fluid attenuation cyst measuring 3.9 cm, minimally increased in size from prior and arising from the upper pole right kidney. Additional subcentimeter hypertension  foci in both kidneys too small to fully characterize on CT imaging but statistically likely benign. No suspicious renal lesion, urolithiasis or hydronephrosis. Indentation of bladder base  by a borderline enlarged prostate. No other gross bladder abnormality. Stomach/Bowel: Distal esophagus and stomach are unremarkable. Mildly thickened and borderline distended loops of small bowel are seen in the midline abdomen with gradual tapering proximal and distal to transitions (2/57, 2/68). Some increased vascularity of the adjacent mesentery with some reactive adenopathy and early hazy mesenteric changes. More distal small bowel is largely decompressed. Small amount of fluid throughout the proximal colon with some minimal formed stool at the level of the rectosigmoid. No colonic dilatation or wall thickening. A normal appendix is visualized. Vascular/Lymphatic: No significant vascular findings are present. No enlarged abdominal or pelvic lymph nodes. Reproductive: Borderline enlarged prostate with indentation of the bladder base. No concerning focal lesions. Other: Some minimal focal stranding and nodularity of the omentum (2/55-56) is nonspecific and possibly related to the adjacent bowel process though an omental fat infarct or other omental process could present similarly. No abdominopelvic free air. Small amount of fluid in the mesenteric leaflets associated with the distended bowel segments. Musculoskeletal: Progressive sclerotic changes and articular surface collapse of the bilateral femoral heads with associated moderate to severe arthrosis of the bilateral hips likely reflecting progression of avascular necrosis. No other acute or suspicious osseous abnormalities. Multilevel degenerative changes are present in the imaged portions of the spine. IMPRESSION: 1. Mildly thickened and borderline distended loops of small bowel in the midline abdomen with gradual tapering proximal and distal to transitions. Some increased vascularity of the adjacent mesentery with some reactive adenopathy and early hazy mesenteric changes. Findings are concerning for an early or partial small bowel obstruction or focal  ileus, possibly related to an enteritis. 2. Some minimal focal stranding and questionable nodularity of the adjacent omentum is nonspecific and possibly related to the adjacent bowel process though an omental fat infarct or other omental process could present similarly. Metastatic nodularity is significantly less favored in the absence of known malignancy. 3. Progressive sclerotic changes and articular surface collapse of the bilateral femoral heads with associated moderate to severe arthrosis of the bilateral hips likely reflecting progression of avascular necrosis. 4. Hepatic steatosis. 5. Gallbladder contracted with several gallstones at the neck of the gallbladder or proximal cystic duct without evidence of acute cholecystitis. 6. Borderline enlarged prostate with indentation of the bladder base. Correlate for symptoms of outlet obstruction. Electronically Signed   By: Lovena Le M.D.   On: 10/13/2019 19:01   DG Abd 2 Views  Result Date: 10/14/2019 CLINICAL DATA:  SBO EXAM: ABDOMEN - 2 VIEW COMPARISON:  10/13/2019 CT abdomen pelvis. FINDINGS: Prominent to mildly dilated gas-filled small bowel loops with right hemiabdomen air-fluid levels. Rounded right upper quadrant calcific densities likely reflect gallstones. No evidence of pneumoperitoneum. Contrast opacified urinary bladder. Sequela of bilateral hip avascular necrosis. Partially imaged pacing lead. IMPRESSION: Mildly dilated small bowel loops with air-fluid levels is concerning for obstruction versus ileus. Cholelithiasis. Bilateral hip avascular necrosis. Electronically Signed   By: Primitivo Gauze M.D.   On: 10/14/2019 08:40      Subjective: No issues today.  Discharge Exam: Vitals:   10/15/19 1928 10/16/19 0400  BP: 119/77 107/70  Pulse: 88 66  Resp:    Temp: (!) 97.5 F (36.4 C) (!) 97.5 F (36.4 C)  SpO2: 94% 95%   Vitals:   10/15/19 0832 10/15/19 1405 10/15/19 1928 10/16/19 0400  BP:  101/60 101/65 119/77 107/70  Pulse:  66 84 88 66  Resp: 16 20    Temp:  97.9 F (36.6 C) (!) 97.5 F (36.4 C) (!) 97.5 F (36.4 C)  TempSrc:  Oral Oral Oral  SpO2: 94% 92% 94% 95%  Weight:      Height:        General exam: Appears calm and comfortable Respiratory system: Clear to auscultation. Respiratory effort normal. Cardiovascular system: S1 & S2 heard, RRR. No murmurs, rubs, gallops or clicks. Gastrointestinal system: Abdomen is protuberant, soft and nontender. No organomegaly or masses felt. Normal bowel sounds heard. Central nervous system: Alert and oriented. No focal neurological deficits. Musculoskeletal: No edema. No calf tenderness Skin: No cyanosis. No rashes Psychiatry: Judgement and insight appear normal. Mood & affect appropriate.    The results of significant diagnostics from this hospitalization (including imaging, microbiology, ancillary and laboratory) are listed below for reference.     Microbiology: Recent Results (from the past 240 hour(s))  SARS Coronavirus 2 by RT PCR (hospital order, performed in [facility] hospital lab) Nasopharyngeal Nasopharyngeal Swab     Status: None   Collection Time: 10/13/19  8:10 PM   Specimen: Nasopharyngeal Swab  Result Value Ref Range Status   SARS Coronavirus 2 NEGATIVE NEGATIVE Final    Comment: (NOTE) SARS-CoV-2 target nucleic acids are NOT DETECTED.  The SARS-CoV-2 RNA is generally detectable in upper and lower respiratory specimens during the acute phase of infection. The lowest concentration of SARS-CoV-2 viral copies this assay can detect is 250 copies / mL. A negative result does not preclude SARS-CoV-2 infection and should not be used as the sole basis for treatment or other patient management decisions.  A negative result may occur with improper specimen collection / handling, submission of specimen other than nasopharyngeal swab, presence of viral mutation(s) within the areas targeted by this assay, and inadequate number of viral copies (<250  copies / mL). A negative result must be combined with clinical observations, patient history, and epidemiological information.  Fact Sheet for Patients:   StrictlyIdeas.no  Fact Sheet for Healthcare Providers: BankingDealers.co.za  This test is not yet approved or  cleared by the [place] FDA and has been authorized for detection and/or diagnosis of SARS-CoV-2 by FDA under an Emergency Use Authorization (EUA).  This EUA will remain in effect (meaning this test can be used) for the duration of the COVID-19 declaration under Section 564(b)(1) of the Act, 21 U.S.C. section 360bbb-3(b)(1), unless the authorization is terminated or revoked sooner.  Performed at [facility], [address] [place], [place] [address]   Gastrointestinal Panel by PCR , Stool     Status: None   Collection Time: 10/14/19  8:30 PM   Specimen: Stool  Result Value Ref Range Status   Campylobacter species NOT DETECTED NOT DETECTED Final   Plesimonas shigelloides NOT DETECTED NOT DETECTED Final   Salmonella species NOT DETECTED NOT DETECTED Final   Yersinia enterocolitica NOT DETECTED NOT DETECTED Final   Vibrio species NOT DETECTED NOT DETECTED Final   Vibrio cholerae NOT DETECTED NOT DETECTED Final   Enteroaggregative E coli (EAEC) NOT DETECTED NOT DETECTED Final   Enteropathogenic E coli (EPEC) NOT DETECTED NOT DETECTED Final   Enterotoxigenic E coli (ETEC) NOT DETECTED NOT DETECTED Final   Shiga like toxin producing E coli (STEC) NOT DETECTED NOT DETECTED Final   Shigella/Enteroinvasive E coli (EIEC) NOT DETECTED NOT DETECTED Final   Cryptosporidium NOT DETECTED NOT DETECTED Final  Cyclospora cayetanensis NOT DETECTED NOT DETECTED Final   Entamoeba histolytica NOT DETECTED NOT DETECTED Final   Giardia lamblia NOT DETECTED NOT DETECTED Final   Adenovirus F40/41 NOT DETECTED NOT DETECTED Final   Astrovirus NOT DETECTED NOT DETECTED Final    Norovirus GI/GII NOT DETECTED NOT DETECTED Final   Rotavirus A NOT DETECTED NOT DETECTED Final   Sapovirus (I, II, IV, and V) NOT DETECTED NOT DETECTED Final    Comment: Performed at [facility], [address] [place], [place] [address]  C Difficile Quick Screen w PCR reflex     Status: None   Collection Time: 10/14/19  8:30 PM   Specimen: STOOL  Result Value Ref Range Status   C Diff antigen NEGATIVE NEGATIVE Final   C Diff toxin NEGATIVE NEGATIVE Final    Comment: NEGATIVE   C Diff interpretation No C. difficile detected.  Final    Comment: Performed at [facility], [address] [place], [place] [address]     Labs: BNP (last 3 results) No results for input(s): BNP in the last 8760 hours. Basic Metabolic Panel: Recent Labs  Lab 10/13/19 1229 10/14/19 0540 10/16/19 0530  NA 142 141 146*  K 3.8 5.1 3.5  CL 109 109 113*  CO2 24 29 28   GLUCOSE 123* 96 97  BUN 9 7 <5*  CREATININE 0.78 0.83 0.73  CALCIUM 8.9 8.1* 8.3*   Liver Function Tests: Recent Labs  Lab 10/13/19 1229  AST 19  ALT 26  ALKPHOS 72  BILITOT 1.8*  PROT 7.3  ALBUMIN 3.8   Recent Labs  Lab 10/13/19 1229  LIPASE 24   No results for input(s): AMMONIA in the last 168 hours. CBC: Recent Labs  Lab 10/13/19 1229 10/14/19 0540  WBC 10.4 6.7  HGB 16.1 14.5  HCT 49.0 43.1  MCV 96.5 95.8  PLT 194 151   Cardiac Enzymes: No results for input(s): CKTOTAL, CKMB, CKMBINDEX, TROPONINI in the last 168 hours. BNP: Invalid input(s): POCBNP CBG: No results for input(s): GLUCAP in the last 168 hours. D-Dimer No results for input(s): DDIMER in the last 72 hours. Hgb A1c No results for input(s): HGBA1C in the last 72 hours. Lipid Profile No results for input(s): CHOL, HDL, LDLCALC, TRIG, CHOLHDL, LDLDIRECT in the last 72 hours. Thyroid function studies No results for input(s): TSH, T4TOTAL, T3FREE, THYROIDAB in the last 72 hours.  Invalid input(s): FREET3 Anemia work up No  results for input(s): VITAMINB12, FOLATE, FERRITIN, TIBC, IRON, RETICCTPCT in the last 72 hours. Urinalysis    Component Value Date/Time   COLORURINE Straw 01/23/2014 1256   APPEARANCEUR Clear 01/23/2014 1256   LABSPEC 1.006 01/23/2014 1256   PHURINE 7.0 01/23/2014 1256   GLUCOSEU Negative 01/23/2014 1256   HGBUR Negative 01/23/2014 1256   BILIRUBINUR Negative 01/23/2014 1256   KETONESUR Negative 01/23/2014 1256   PROTEINUR Negative 01/23/2014 1256   NITRITE Negative 01/23/2014 1256   LEUKOCYTESUR Negative 01/23/2014 1256   Sepsis Labs Invalid input(s): PROCALCITONIN,  WBC,  LACTICIDVEN Microbiology Recent Results (from the past 240 hour(s))  SARS Coronavirus 2 by RT PCR (hospital order, performed in [facility] hospital lab) Nasopharyngeal Nasopharyngeal Swab     Status: None   Collection Time: 10/13/19  8:10 PM   Specimen: Nasopharyngeal Swab  Result Value Ref Range Status   SARS Coronavirus 2 NEGATIVE NEGATIVE Final    Comment: (NOTE) SARS-CoV-2 target nucleic acids are NOT DETECTED.  The SARS-CoV-2 RNA is generally detectable in upper and lower  respiratory specimens during the acute phase of infection. The lowest concentration of SARS-CoV-2 viral copies this assay can detect is 250 copies / mL. A negative result does not preclude SARS-CoV-2 infection and should not be used as the sole basis for treatment or other patient management decisions.  A negative result may occur with improper specimen collection / handling, submission of specimen other than nasopharyngeal swab, presence of viral mutation(s) within the areas targeted by this assay, and inadequate number of viral copies (<250 copies / mL). A negative result must be combined with clinical observations, patient history, and epidemiological information.  Fact Sheet for Patients:   StrictlyIdeas.no  Fact Sheet for Healthcare Providers: BankingDealers.co.za  This test  is not yet approved or  cleared by the [place] FDA and has been authorized for detection and/or diagnosis of SARS-CoV-2 by FDA under an Emergency Use Authorization (EUA).  This EUA will remain in effect (meaning this test can be used) for the duration of the COVID-19 declaration under Section 564(b)(1) of the Act, 21 U.S.C. section 360bbb-3(b)(1), unless the authorization is terminated or revoked sooner.  Performed at [facility], [address] [place], [place] [address]   Gastrointestinal Panel by PCR , Stool     Status: None   Collection Time: 10/14/19  8:30 PM   Specimen: Stool  Result Value Ref Range Status   Campylobacter species NOT DETECTED NOT DETECTED Final   Plesimonas shigelloides NOT DETECTED NOT DETECTED Final   Salmonella species NOT DETECTED NOT DETECTED Final   Yersinia enterocolitica NOT DETECTED NOT DETECTED Final   Vibrio species NOT DETECTED NOT DETECTED Final   Vibrio cholerae NOT DETECTED NOT DETECTED Final   Enteroaggregative E coli (EAEC) NOT DETECTED NOT DETECTED Final   Enteropathogenic E coli (EPEC) NOT DETECTED NOT DETECTED Final   Enterotoxigenic E coli (ETEC) NOT DETECTED NOT DETECTED Final   Shiga like toxin producing E coli (STEC) NOT DETECTED NOT DETECTED Final   Shigella/Enteroinvasive E coli (EIEC) NOT DETECTED NOT DETECTED Final   Cryptosporidium NOT DETECTED NOT DETECTED Final   Cyclospora cayetanensis NOT DETECTED NOT DETECTED Final   Entamoeba histolytica NOT DETECTED NOT DETECTED Final   Giardia lamblia NOT DETECTED NOT DETECTED Final   Adenovirus F40/41 NOT DETECTED NOT DETECTED Final   Astrovirus NOT DETECTED NOT DETECTED Final   Norovirus GI/GII NOT DETECTED NOT DETECTED Final   Rotavirus A NOT DETECTED NOT DETECTED Final   Sapovirus (I, II, IV, and V) NOT DETECTED NOT DETECTED Final    Comment: Performed at [facility] [address] [place], [place] [address]  C Difficile Quick Screen w PCR reflex      Status: None   Collection Time: 10/14/19  8:30 PM   Specimen: STOOL  Result Value Ref Range Status   C Diff antigen NEGATIVE NEGATIVE Final   C Diff toxin NEGATIVE NEGATIVE Final    Comment: NEGATIVE   C Diff interpretation No C. difficile detected.  Final    Comment: Performed at [facility], [address] [place], [place] [address]     Time coordinating discharge: 35 minutes  SIGNED:   Cordelia Poche, MD [facility] 10/16/2019, 3:51 PM

## 2019-10-16 NOTE — TOC Initial Note (Signed)
Transition of Care [facility]) - Initial/Assessment Note    Patient Details  Name: James [name] MRN: [id number] Date of Birth: [birth year]  Transition of Care [facility]) CM/SW Contact:    Candie Chroman, LCSW Phone Number: 10/16/2019, 9:41 AM  Clinical Narrative:  CSW met with patient. Wife at bedside. CSW introduced role and explained that PT recommendations would be discussed. Patient is not interested in home health at this time. He has a walker, cane, and power chair at home. CSW encouraged patient and his wife to contact his PCP if he gets home and changes his mind about home health. No further concerns. CSW encouraged patient and his wife to contact CSW as needed. CSW will continue to follow patient and his wife for support and facilitate return home when stable.                Expected Discharge Plan: Home/Self Care Barriers to Discharge: Continued Medical Work up   Patient Goals and CMS Choice     Choice offered to / list presented to : NA  Expected Discharge Plan and Services Expected Discharge Plan: Home/Self Care       Living arrangements for the past 2 months: Single Family Home                                      Prior Living Arrangements/Services Living arrangements for the past 2 months: Single Family Home Lives with:: Spouse Patient language and need for interpreter reviewed:: Yes Do you feel safe going back to the place where you live?: Yes      Need for Family Participation in Patient Care: Yes (Comment) Care giver support system in place?: Yes (comment) Current home services: DME Criminal Activity/Legal Involvement Pertinent to Current Situation/Hospitalization: No - Comment as needed  Activities of Daily Living Home Assistive Devices/Equipment: Cane (specify quad or straight), Walker (specify type), Shower chair with back, Wheelchair ADL Screening (condition at time of admission) Patient's cognitive ability adequate to safely complete daily activities?:  Yes Is the patient deaf or have difficulty hearing?: No Does the patient have difficulty seeing, even when wearing glasses/contacts?: No Does the patient have difficulty concentrating, remembering, or making decisions?: No Patient able to express need for assistance with ADLs?: Yes Does the patient have difficulty dressing or bathing?: No Independently performs ADLs?: Yes (appropriate for developmental age) Does the patient have difficulty walking or climbing stairs?: Yes Weakness of Legs: Both Weakness of Arms/Hands: Both  Permission Sought/Granted Permission sought to share information with : Family Supports    Share Information with NAME: Guiseppe Flanagan     Permission granted to share info w Relationship: Wife  Permission granted to share info w Contact Information: [phone]  Emotional Assessment Appearance:: Appears stated age Attitude/Demeanor/Rapport: Engaged Affect (typically observed): Flat Orientation: : Oriented to Self, Oriented to Place, Oriented to  Time, Oriented to Situation Alcohol / Substance Use: Not Applicable Psych Involvement: No (comment)  Admission diagnosis:  SBO (small bowel obstruction) ([place]) [K56.609] Partial small bowel obstruction ([place]) [K56.600] Patient Active Problem List   Diagnosis Date Noted  . SBO (small bowel obstruction) ([place]) 10/14/2019  . Partial small bowel obstruction ([place]) 10/13/2019  . Numbness 02/16/2018  . Constipation 02/16/2018  . Avascular necrosis of hip, unspecified laterality ([place]) 11/08/2017  . Fall 11/08/2017  . Positive colorectal cancer screening using Cologuard test 03/31/2017  . ICD (implantable cardioverter-defibrillator) in place 09/23/2016  .  Prediabetes 12/01/2015  . Heart palpitations 09/25/2015  . Obstructive sleep apnea 06/26/2015  . Rotator cuff tendinitis 05/22/2015  . Dysphagia   . 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 ED (erectile dysfunction) of organic origin  06/01/2013  . Male hypogonadism 06/01/2013  . Cardiomyopathy ([place])   . Left bundle branch block   . Myotonic dystrophy, type 1 ([place])    PCP:  Leone Haven, MD Pharmacy:   [facility] [address]), [place]) [place] [address] Phone: [phone] Fax: [phone]     Social Determinants of Health (SDOH) Interventions    Readmission Risk Interventions No flowsheet data found.

## 2019-10-16 NOTE — Care Management Important Message (Signed)
Important Message  Patient Details  Name: James [name] MRN: [id number] Date of Birth: 16-Jun-1965   Medicare Important Message Given:  Yes  Initial Medicare IM given by Patient Access Associate on 10/15/2019 at 9:28am.     Dannette Barbara 10/16/2019, 4:50 PM

## 2019-10-16 NOTE — Discharge Instructions (Signed)
[place] were in the hospital with inflammation of your intestines with concern for a blockage vs partial blockage vs ileus. You were treated empirically with antibiotics. Your lab tests did not show evidence of common GI pathogens. The GI doctor recommends Rifaxamin for 2 weeks and follow-up with Dr. Vicente Males. Please use Miralax as needed to keep your bowel movements regular without causing significant diarrhea.

## 2019-10-16 NOTE — Plan of Care (Signed)
The patient has been discharged. IV has been removed. The patient has been educated on how to take prescribed medications.  Problem: Education: Goal: Knowledge of General Education information will improve Description: Including pain rating scale, medication(s)/side effects and non-pharmacologic comfort measures Outcome: Completed/Met   Problem: Health Behavior/Discharge Planning: Goal: Ability to manage health-related needs will improve Outcome: Completed/Met   Problem: Clinical Measurements: Goal: Ability to maintain clinical measurements within normal limits will improve Outcome: Completed/Met Goal: Will remain free from infection Outcome: Completed/Met Goal: Diagnostic test results will improve Outcome: Completed/Met Goal: Respiratory complications will improve Outcome: Completed/Met Goal: Cardiovascular complication will be avoided Outcome: Completed/Met   Problem: Nutrition: Goal: Adequate nutrition will be maintained Outcome: Completed/Met   Problem: Activity: Goal: Risk for activity intolerance will decrease Outcome: Completed/Met   Problem: Coping: Goal: Level of anxiety will decrease Outcome: Completed/Met   Problem: Elimination: Goal: Will not experience complications related to bowel motility Outcome: Completed/Met Goal: Will not experience complications related to urinary retention Outcome: Completed/Met   Problem: Pain Managment: Goal: General experience of comfort will improve Outcome: Completed/Met   Problem: Safety: Goal: Ability to remain free from injury will improve Outcome: Completed/Met   Problem: Acute Rehab PT Goals(only PT should resolve) Goal: Pt Will Go Supine/Side To Sit Outcome: Completed/Met Goal: Patient Will Transfer Sit To/From Stand Outcome: Completed/Met Goal: Pt Will Perform Standing Balance Or Pre-Gait Outcome: Completed/Met Goal: Pt Will Ambulate Outcome: Completed/Met

## 2019-10-17 ENCOUNTER — Telehealth: Payer: Self-pay

## 2019-10-17 ENCOUNTER — Ambulatory Visit: Payer: Medicare HMO | Admitting: Gastroenterology

## 2019-10-17 MED ORDER — CIPROFLOXACIN HCL 500 MG PO TABS
500.0000 mg | ORAL_TABLET | Freq: Two times a day (BID) | ORAL | 0 refills | Status: AC
Start: 2019-10-17 — End: 2019-10-31

## 2019-10-17 NOTE — Telephone Encounter (Signed)
Let us try 2 weeks course of Cipro and Flagyl 500 mg twice daily  RV

## 2019-10-17 NOTE — Telephone Encounter (Signed)
Patient verbalized understanding. Sent cipro to the pharmacy. He states he will go pick up medication

## 2019-10-17 NOTE — Addendum Note (Signed)
Addended by: Ulyess Blossom L on: 10/17/2019 11:41 AM   Modules accepted: Orders

## 2019-10-17 NOTE — Telephone Encounter (Signed)
Did PA through [place] my meds for patient Xifaxan 550mg . [place] denied medication. Please advised what you would recommend him taking

## 2019-10-17 NOTE — Telephone Encounter (Signed)
First attempt to reach patient for transition of care. Patient unavailable. Hospital follow up scheduled with PMD prior to discharge, 10/22/19 @ 11:30. Keep all scheduled appointments. Nurse will follow as appropriate.

## 2019-10-18 NOTE — Telephone Encounter (Signed)
Reviewed

## 2019-10-18 NOTE — Telephone Encounter (Signed)
Transition Care Management Follow-up Telephone Call  Date of discharge and from where: 10/16/19 from [facility]  How have you been since you were released from the hospital? I am doing good. Denies pain with BM, nausea. Input/output appropriate. Visit with GI yesterday.   Any questions or concerns? No   Items Reviewed:  Did the pt receive and understand the discharge instructions provided? Yes   Medications obtained and verified? Yes   Any new allergies since your discharge? No   Dietary orders reviewed? Yes  Do you have support at home? Yes   Functional Questionnaire: (I = Independent and D = Dependent) ADLs: wife assists as needed  Maintaining continence- i  Transferring/Ambulation- Walker/cane  Managing Meds- i  Follow up appointments reviewed:   PCP Hospital f/u appt confirmed? Yes  Scheduled to see Dr. Caryl Bis on 10/22/19 @ 11:30. Telephone visit preferred [phone]. No access to video.  This visit may not qualify for tcm.   Plans to call pulmonary and schedule follow up.   Are transportation arrangements needed? No   If their condition worsens, is the pt aware to call PCP or go to the Emergency Dept.? Yes  Was the patient provided with contact information for the PCP's office or ED? Yes  Was to pt encouraged to call back with questions or concerns? Yes

## 2019-10-22 ENCOUNTER — Encounter: Payer: Self-pay | Admitting: Family Medicine

## 2019-10-22 ENCOUNTER — Other Ambulatory Visit: Payer: Self-pay

## 2019-10-22 ENCOUNTER — Ambulatory Visit (INDEPENDENT_AMBULATORY_CARE_PROVIDER_SITE_OTHER): Payer: Medicare HMO | Admitting: Family Medicine

## 2019-10-22 VITALS — Ht 70.0 in | Wt 200.0 lb

## 2019-10-22 DIAGNOSIS — E870 Hyperosmolality and hypernatremia: Secondary | ICD-10-CM | POA: Insufficient documentation

## 2019-10-22 DIAGNOSIS — M87059 Idiopathic aseptic necrosis of unspecified femur: Secondary | ICD-10-CM

## 2019-10-22 DIAGNOSIS — K56600 Partial intestinal obstruction, unspecified as to cause: Secondary | ICD-10-CM | POA: Diagnosis not present

## 2019-10-22 NOTE — Assessment & Plan Note (Signed)
Symptoms have resolved.  Patient will finish his rifaximin.  He will monitor for any recurrence of symptoms.  He will keep his appointment with GI.

## 2019-10-22 NOTE — Assessment & Plan Note (Signed)
No significant symptoms.  Per orthopedics he will follow up with them if his symptoms progress.

## 2019-10-22 NOTE — Progress Notes (Signed)
Virtual Visit via telephone Note  This visit type was conducted due to national recommendations for restrictions regarding the COVID-19 pandemic (e.g. social distancing).  This format is felt to be most appropriate for this patient at this time.  All issues noted in this document were discussed and addressed.  No physical exam was performed (except for noted visual exam findings with Video Visits).   I connected with James [name] today at 11:30 AM EDT by telephone and verified that I am speaking with the correct person using two identifiers. Location patient: home Location provider: work  Persons participating in the virtual visit: patient, provider  I discussed the limitations, risks, security and privacy concerns of performing an evaluation and management service by telephone and the availability of in person appointments. I also discussed with the patient that there may be a patient responsible charge related to this service. The patient expressed understanding and agreed to proceed.  Interactive audio and video telecommunications were attempted between this provider and patient, however failed, due to patient having technical difficulties OR patient did not have access to video capability.  We continued and completed visit with audio only.   Reason for visit: follow-up  HPI: Partial bowel obstruction: Patient was hospitalized after sudden onset nausea and abdominal pain.  He was evaluated by general surgery and they recommended conservative therapy.  He was kept n.p.o. until his pain improved.  They slowly advance his diet and he is back to his normal diet now.  He notes no nausea or abdominal pain since discharge from the hospital.  No vomiting.  No dyspnea.  No wheezing.  He is having good daily bowel movements.  He is currently on rifaximin.  He does have follow-up with GI in late August.  Avascular necrosis bilateral hips: Noted to be somewhat more advanced on his imaging.  He did see  orthopedics in 2019 for this and they recommended conservative therapy and advised that the patient follow-up only if his pain worsened.  The patient notes occasionally having discomfort though it is not progressing.   ROS: See pertinent positives and negatives per HPI.  Past Medical History:  Diagnosis Date  . Bronchitis   . Cardiomyopathy ([place])   . Dysphagia   . GERD (gastroesophageal reflux disease)   . History of chicken pox   . History of pneumonia 2013   double PNA at [facility]  . LBBB (left bundle branch block)   . Myotonic dystrophy, type 1 ([place])    followed by [facility]  . Pneumonia    aspiration  . Sleep apnea     Past Surgical History:  Procedure Laterality Date  . CARDIAC DEFIBRILLATOR PLACEMENT     defib/pacer  . CARDIAC DEFIBRILLATOR PLACEMENT    . COLONOSCOPY WITH PROPOFOL N/A 08/16/2016   Procedure: COLONOSCOPY WITH PROPOFOL;  Surgeon: Jonathon Bellows, MD;  Location: [facility] ENDOSCOPY;  Service: Gastroenterology;  Laterality: N/A;  . COLONOSCOPY WITH PROPOFOL N/A 04/04/2017   Procedure: COLONOSCOPY WITH PROPOFOL;  Surgeon: Jonathon Bellows, MD;  Location: [facility] ENDOSCOPY;  Service: Gastroenterology;  Laterality: N/A;  . PACEMAKER IMPLANT    . US ECHOCARDIOGRAPHY  09/2010   EF 50%, mld LVH and dysfunction, mild RV dysfunction and enlarged    Family History  Problem Relation Age of Onset  . Hypertension Father   . Cancer Paternal Grandfather        lung, smoker  . Cancer Paternal Uncle        bone  . Diabetes Mother   .  CAD Mother 46       MI  . Stroke Neg Hx     SOCIAL HX: Non-smoker   Current Outpatient Medications:  .  albuterol (PROVENTIL HFA;VENTOLIN HFA) 108 (90 Base) MCG/ACT inhaler, Inhale 2 puffs into the lungs every 6 (six) hours as needed for wheezing or shortness of breath., Disp: 1 Inhaler, Rfl: 0 .  albuterol (PROVENTIL) (2.5 MG/3ML) 0.083% nebulizer solution, Take 3 mLs (2.5 mg total) by nebulization every 6 (six) hours as needed for wheezing or shortness of  breath., Disp: 150 mL, Rfl: 1 .  ciprofloxacin (CIPRO) 500 MG tablet, Take 1 tablet (500 mg total) by mouth 2 (two) times daily for 14 days., Disp: 20 tablet, Rfl: 0 .  lisinopril (PRINIVIL,ZESTRIL) 5 MG tablet, Take 2.5 mg by mouth 2 (two) times daily. , Disp: , Rfl:  .  loratadine (CLARITIN) 10 MG tablet, Take 1 tablet (10 mg total) by mouth daily., Disp: , Rfl:  .  Multiple Vitamin (MULTIVITAMIN) tablet, Take 1 tablet by mouth daily., Disp: , Rfl:  .  polyethylene glycol (MIRALAX MIX-IN PAX) 17 g packet, Take 17 g by mouth daily as needed for mild constipation., Disp: , Rfl:  .  rifaximin (XIFAXAN) 550 MG TABS tablet, Take 1 tablet (550 mg total) by mouth 3 (three) times daily for 13 days., Disp: 39 tablet, Rfl: 0  EXAM: This is a telephone visit and thus no physical exam was completed.  ASSESSMENT AND PLAN:  Discussed the following assessment and plan:  Partial small bowel obstruction (HCC) Symptoms have resolved.  Patient will finish his rifaximin.  He will monitor for any recurrence of symptoms.  He will keep his appointment with GI.  Avascular necrosis of hip, unspecified laterality (HCC) No significant symptoms.  Per orthopedics he will follow up with them if his symptoms progress.  Hypernatremia Recheck labs.   Orders Placed This Encounter  Procedures  . Basic Metabolic Panel (BMET)    Standing Status:   Future    Standing Expiration Date:   10/21/2020    No orders of the defined types were placed in this encounter.    I discussed the assessment and treatment plan with the patient. The patient was provided an opportunity to ask questions and all were answered. The patient agreed with the plan and demonstrated an understanding of the instructions.   The patient was advised to call back or seek an in-person evaluation if the symptoms worsen or if the condition fails to improve as anticipated.  I provided 8 minutes of non-face-to-face time during this encounter.   [name], MD

## 2019-10-22 NOTE — Assessment & Plan Note (Signed)
Recheck labs 

## 2019-10-25 ENCOUNTER — Other Ambulatory Visit (INDEPENDENT_AMBULATORY_CARE_PROVIDER_SITE_OTHER): Payer: Medicare HMO

## 2019-10-25 ENCOUNTER — Other Ambulatory Visit: Payer: Self-pay

## 2019-10-25 DIAGNOSIS — E870 Hyperosmolality and hypernatremia: Secondary | ICD-10-CM

## 2019-10-25 LAB — BASIC METABOLIC PANEL
BUN: 9 mg/dL (ref 6–23)
CO2: 31 mEq/L (ref 19–32)
Calcium: 8.7 mg/dL (ref 8.4–10.5)
Chloride: 108 mEq/L (ref 96–112)
Creatinine, Ser: 0.71 mg/dL (ref 0.40–1.50)
GFR: 115.72 mL/min (ref >60.00)
Glucose, Bld: 111 mg/dL — ABNORMAL HIGH (ref 70–99)
Potassium: 4.2 mEq/L (ref 3.5–5.1)
Sodium: 143 mEq/L (ref 135–145)

## 2019-10-31 DIAGNOSIS — R05 Cough: Secondary | ICD-10-CM | POA: Diagnosis not present

## 2019-10-31 DIAGNOSIS — G7100 Muscular dystrophy, unspecified: Secondary | ICD-10-CM | POA: Diagnosis not present

## 2019-10-31 DIAGNOSIS — R0600 Dyspnea, unspecified: Secondary | ICD-10-CM | POA: Diagnosis not present

## 2019-11-19 DIAGNOSIS — I428 Other cardiomyopathies: Secondary | ICD-10-CM | POA: Diagnosis not present

## 2019-11-22 ENCOUNTER — Telehealth (INDEPENDENT_AMBULATORY_CARE_PROVIDER_SITE_OTHER): Payer: Medicare HMO | Admitting: Family Medicine

## 2019-11-22 ENCOUNTER — Encounter: Payer: Self-pay | Admitting: Family Medicine

## 2019-11-22 ENCOUNTER — Other Ambulatory Visit: Payer: Self-pay

## 2019-11-22 DIAGNOSIS — R053 Chronic cough: Secondary | ICD-10-CM

## 2019-11-22 DIAGNOSIS — R05 Cough: Secondary | ICD-10-CM | POA: Diagnosis not present

## 2019-11-22 DIAGNOSIS — I429 Cardiomyopathy, unspecified: Secondary | ICD-10-CM | POA: Diagnosis not present

## 2019-11-22 DIAGNOSIS — G4733 Obstructive sleep apnea (adult) (pediatric): Secondary | ICD-10-CM

## 2019-11-22 NOTE — Assessment & Plan Note (Signed)
Asymptomatic.  Continue lisinopril.  He will continue to see his cardiologist.

## 2019-11-22 NOTE — Assessment & Plan Note (Signed)
Well-controlled.  Continue BiPAP.  Managed through specialist at [facility].

## 2019-11-22 NOTE — Progress Notes (Signed)
Virtual Visit via telephone Note  This visit type was conducted due to national recommendations for restrictions regarding the COVID-19 pandemic (e.g. social distancing).  This format is felt to be most appropriate for this patient at this time.  All issues noted in this document were discussed and addressed.  No physical exam was performed (except for noted visual exam findings with Video Visits).   I connected with James [name] today at  1:15 PM EDT by a video enabled telemedicine application or telephone and verified that I am speaking with the correct person using two identifiers. Location patient: home Location provider: work Persons participating in the virtual visit: patient, provider  I discussed the limitations, risks, security and privacy concerns of performing an evaluation and management service by telephone and the availability of in person appointments. I also discussed with the patient that there may be a patient responsible charge related to this service. The patient expressed understanding and agreed to proceed.  Interactive audio and video telecommunications were attempted between this provider and patient, however failed, due to patient having technical difficulties OR patient did not have access to video capability.  We continued and completed visit with audio only.   Reason for visit: f/u  HPI: Cardiomyopathy: Currently on lisinopril.  No chest pain, shortness of breath, edema, orthopnea, or PND.  He saw his cardiologist recently.  Everything was stable and they plan for an echo in 6 months.  OSA: Wearing his BiPAP nightly.  Rarely feels tired during the day.  Does wake up well rested.  Follows with a specialist at [facility] for this.  Chronic cough: Occasional mild cough.  He saw pulmonology at [facility] clinic and spirometry revealed restriction and PFTs revealed restriction and that they felt was due to weight as well as cardiomegaly.  They advised as needed incentive  spirometer and flutter valve.   ROS: See pertinent positives and negatives per HPI.  Past Medical History:  Diagnosis Date  . Bronchitis   . Cardiomyopathy ([place])   . Dysphagia   . GERD (gastroesophageal reflux disease)   . History of chicken pox   . History of pneumonia 2013   double PNA at [facility]  . LBBB (left bundle branch block)   . Myotonic dystrophy, type 1 ([place])    followed by [facility]  . Pneumonia    aspiration  . Sleep apnea     Past Surgical History:  Procedure Laterality Date  . CARDIAC DEFIBRILLATOR PLACEMENT     defib/pacer  . CARDIAC DEFIBRILLATOR PLACEMENT    . COLONOSCOPY WITH PROPOFOL N/A 08/16/2016   Procedure: COLONOSCOPY WITH PROPOFOL;  Surgeon: Jonathon Bellows, MD;  Location: [facility] ENDOSCOPY;  Service: Gastroenterology;  Laterality: N/A;  . COLONOSCOPY WITH PROPOFOL N/A 04/04/2017   Procedure: COLONOSCOPY WITH PROPOFOL;  Surgeon: Jonathon Bellows, MD;  Location: [facility] ENDOSCOPY;  Service: Gastroenterology;  Laterality: N/A;  . PACEMAKER IMPLANT    . US ECHOCARDIOGRAPHY  09/2010   EF 50%, mld LVH and dysfunction, mild RV dysfunction and enlarged    Family History  Problem Relation Age of Onset  . Hypertension Father   . Cancer Paternal Grandfather        lung, smoker  . Cancer Paternal Uncle        bone  . Diabetes Mother   . CAD Mother 76       MI  . Stroke Neg Hx     SOCIAL HX: Non-smoker   Current Outpatient Medications:  .  albuterol (PROVENTIL HFA;VENTOLIN HFA)  108 (90 Base) MCG/ACT inhaler, Inhale 2 puffs into the lungs every 6 (six) hours as needed for wheezing or shortness of breath., Disp: 1 Inhaler, Rfl: 0 .  albuterol (PROVENTIL) (2.5 MG/3ML) 0.083% nebulizer solution, Take 3 mLs (2.5 mg total) by nebulization every 6 (six) hours as needed for wheezing or shortness of breath., Disp: 150 mL, Rfl: 1 .  lisinopril (PRINIVIL,ZESTRIL) 5 MG tablet, Take 2.5 mg by mouth 2 (two) times daily. , Disp: , Rfl:  .  loratadine (CLARITIN) 10 MG tablet, Take 1 tablet  (10 mg total) by mouth daily., Disp: , Rfl:  .  Multiple Vitamin (MULTIVITAMIN) tablet, Take 1 tablet by mouth daily., Disp: , Rfl:  .  polyethylene glycol (MIRALAX MIX-IN PAX) 17 g packet, Take 17 g by mouth daily as needed for mild constipation., Disp: , Rfl:   EXAM: This is a telephone visit and thus no physical exam was completed.  ASSESSMENT AND PLAN:  Discussed the following assessment and plan:  Obstructive sleep apnea Well-controlled.  Continue BiPAP.  Managed through specialist at [facility].  Cardiomyopathy ([place]) Asymptomatic.  Continue lisinopril.  He will continue to see his cardiologist.  Chronic cough Very mild symptoms.  Patient has been evaluated by pulmonology.  He will continue to monitor.   Health maintenance: Patient notes he got the COVID-19 vaccine.  He will try to get [place] a copy of this.  Encouraged him to stay socially distance and to continue to wear a mask.  No orders of the defined types were placed in this encounter.   No orders of the defined types were placed in this encounter.    I discussed the assessment and treatment plan with the patient. The patient was provided an opportunity to ask questions and all were answered. The patient agreed with the plan and demonstrated an understanding of the instructions.   The patient was advised to call back or seek an in-person evaluation if the symptoms worsen or if the condition fails to improve as anticipated.  I provided 6 minutes of non-face-to-face time during this encounter.   [name], MD

## 2019-11-22 NOTE — Assessment & Plan Note (Signed)
Very mild symptoms.  Patient has been evaluated by pulmonology.  He will continue to monitor.

## 2019-11-26 ENCOUNTER — Ambulatory Visit: Payer: Medicare HMO | Admitting: Gastroenterology

## 2019-12-10 DIAGNOSIS — Z95810 Presence of automatic (implantable) cardiac defibrillator: Secondary | ICD-10-CM | POA: Diagnosis not present

## 2019-12-15 DIAGNOSIS — Z95810 Presence of automatic (implantable) cardiac defibrillator: Secondary | ICD-10-CM | POA: Diagnosis not present

## 2020-01-10 DIAGNOSIS — I428 Other cardiomyopathies: Secondary | ICD-10-CM | POA: Diagnosis not present

## 2020-01-10 DIAGNOSIS — I480 Paroxysmal atrial fibrillation: Secondary | ICD-10-CM | POA: Diagnosis not present

## 2020-01-10 DIAGNOSIS — G7111 Myotonic muscular dystrophy: Secondary | ICD-10-CM | POA: Diagnosis not present

## 2020-01-10 DIAGNOSIS — Z23 Encounter for immunization: Secondary | ICD-10-CM | POA: Diagnosis not present

## 2020-01-10 DIAGNOSIS — Z95810 Presence of automatic (implantable) cardiac defibrillator: Secondary | ICD-10-CM | POA: Diagnosis not present

## 2020-02-04 DIAGNOSIS — G7111 Myotonic muscular dystrophy: Secondary | ICD-10-CM | POA: Diagnosis not present

## 2020-04-14 DIAGNOSIS — Z95810 Presence of automatic (implantable) cardiac defibrillator: Secondary | ICD-10-CM | POA: Diagnosis not present

## 2020-04-21 DIAGNOSIS — Z95810 Presence of automatic (implantable) cardiac defibrillator: Secondary | ICD-10-CM | POA: Diagnosis not present

## 2020-05-29 DIAGNOSIS — G4733 Obstructive sleep apnea (adult) (pediatric): Secondary | ICD-10-CM | POA: Diagnosis not present

## 2020-05-29 DIAGNOSIS — R059 Cough, unspecified: Secondary | ICD-10-CM | POA: Diagnosis not present

## 2020-05-29 DIAGNOSIS — K5909 Other constipation: Secondary | ICD-10-CM | POA: Diagnosis not present

## 2020-05-29 DIAGNOSIS — R42 Dizziness and giddiness: Secondary | ICD-10-CM | POA: Diagnosis not present

## 2020-05-29 DIAGNOSIS — R1310 Dysphagia, unspecified: Secondary | ICD-10-CM | POA: Diagnosis not present

## 2020-05-29 DIAGNOSIS — I429 Cardiomyopathy, unspecified: Secondary | ICD-10-CM | POA: Diagnosis not present

## 2020-05-29 DIAGNOSIS — R531 Weakness: Secondary | ICD-10-CM | POA: Diagnosis not present

## 2020-05-29 DIAGNOSIS — R1110 Vomiting, unspecified: Secondary | ICD-10-CM | POA: Diagnosis not present

## 2020-05-29 DIAGNOSIS — G7111 Myotonic muscular dystrophy: Secondary | ICD-10-CM | POA: Diagnosis not present

## 2020-06-11 DIAGNOSIS — R053 Chronic cough: Secondary | ICD-10-CM | POA: Diagnosis not present

## 2020-06-11 DIAGNOSIS — R0600 Dyspnea, unspecified: Secondary | ICD-10-CM | POA: Diagnosis not present

## 2020-06-29 ENCOUNTER — Other Ambulatory Visit: Payer: Self-pay

## 2020-06-29 ENCOUNTER — Emergency Department: Payer: Medicare HMO

## 2020-06-29 ENCOUNTER — Inpatient Hospital Stay
Admission: EM | Admit: 2020-06-29 | Discharge: 2020-07-01 | DRG: 392 | Disposition: A | Discharge status: Home / Self Care | Payer: Medicare HMO | Attending: Internal Medicine | Admitting: Internal Medicine

## 2020-06-29 DIAGNOSIS — D126 Benign neoplasm of colon, unspecified: Secondary | ICD-10-CM | POA: Diagnosis not present

## 2020-06-29 DIAGNOSIS — G4733 Obstructive sleep apnea (adult) (pediatric): Secondary | ICD-10-CM | POA: Diagnosis present

## 2020-06-29 DIAGNOSIS — K529 Noninfective gastroenteritis and colitis, unspecified: Secondary | ICD-10-CM | POA: Diagnosis present

## 2020-06-29 DIAGNOSIS — R1310 Dysphagia, unspecified: Secondary | ICD-10-CM | POA: Diagnosis present

## 2020-06-29 DIAGNOSIS — I10 Essential (primary) hypertension: Secondary | ICD-10-CM | POA: Diagnosis present

## 2020-06-29 DIAGNOSIS — R112 Nausea with vomiting, unspecified: Secondary | ICD-10-CM

## 2020-06-29 DIAGNOSIS — Z885 Allergy status to narcotic agent status: Secondary | ICD-10-CM | POA: Diagnosis not present

## 2020-06-29 DIAGNOSIS — K5140 Inflammatory polyps of colon without complications: Secondary | ICD-10-CM | POA: Diagnosis not present

## 2020-06-29 DIAGNOSIS — I509 Heart failure, unspecified: Secondary | ICD-10-CM | POA: Diagnosis not present

## 2020-06-29 DIAGNOSIS — Z79899 Other long term (current) drug therapy: Secondary | ICD-10-CM | POA: Diagnosis not present

## 2020-06-29 DIAGNOSIS — K635 Polyp of colon: Secondary | ICD-10-CM | POA: Diagnosis present

## 2020-06-29 DIAGNOSIS — I447 Left bundle-branch block, unspecified: Secondary | ICD-10-CM | POA: Diagnosis present

## 2020-06-29 DIAGNOSIS — K760 Fatty (change of) liver, not elsewhere classified: Secondary | ICD-10-CM | POA: Diagnosis not present

## 2020-06-29 DIAGNOSIS — E876 Hypokalemia: Secondary | ICD-10-CM | POA: Diagnosis present

## 2020-06-29 DIAGNOSIS — I429 Cardiomyopathy, unspecified: Secondary | ICD-10-CM | POA: Diagnosis present

## 2020-06-29 DIAGNOSIS — Z8701 Personal history of pneumonia (recurrent): Secondary | ICD-10-CM | POA: Diagnosis not present

## 2020-06-29 DIAGNOSIS — K8020 Calculus of gallbladder without cholecystitis without obstruction: Secondary | ICD-10-CM | POA: Diagnosis present

## 2020-06-29 DIAGNOSIS — Z20822 Contact with and (suspected) exposure to covid-19: Secondary | ICD-10-CM | POA: Diagnosis present

## 2020-06-29 DIAGNOSIS — K219 Gastro-esophageal reflux disease without esophagitis: Secondary | ICD-10-CM | POA: Diagnosis present

## 2020-06-29 DIAGNOSIS — G7111 Myotonic muscular dystrophy: Secondary | ICD-10-CM | POA: Diagnosis present

## 2020-06-29 DIAGNOSIS — R109 Unspecified abdominal pain: Secondary | ICD-10-CM | POA: Diagnosis not present

## 2020-06-29 DIAGNOSIS — R1011 Right upper quadrant pain: Secondary | ICD-10-CM

## 2020-06-29 DIAGNOSIS — R1311 Dysphagia, oral phase: Secondary | ICD-10-CM | POA: Diagnosis not present

## 2020-06-29 DIAGNOSIS — R197 Diarrhea, unspecified: Secondary | ICD-10-CM | POA: Diagnosis not present

## 2020-06-29 DIAGNOSIS — R42 Dizziness and giddiness: Secondary | ICD-10-CM | POA: Diagnosis not present

## 2020-06-29 DIAGNOSIS — Z95810 Presence of automatic (implantable) cardiac defibrillator: Secondary | ICD-10-CM | POA: Diagnosis not present

## 2020-06-29 DIAGNOSIS — Z8249 Family history of ischemic heart disease and other diseases of the circulatory system: Secondary | ICD-10-CM | POA: Diagnosis not present

## 2020-06-29 LAB — GASTROINTESTINAL PANEL BY PCR, STOOL (REPLACES STOOL CULTURE)

## 2020-06-29 LAB — CBC
HCT: 48.1 % (ref 39.0–52.0)
Hemoglobin: 16.3 g/dL (ref 13.0–17.0)
MCH: 32.1 pg (ref 26.0–34.0)
MCHC: 33.9 g/dL (ref 30.0–36.0)
MCV: 94.7 fL (ref 80.0–100.0)
Platelets: 263 10*3/uL (ref 150–400)
RBC: 5.08 MIL/uL (ref 4.22–5.81)
RDW: 13.2 % (ref 11.5–15.5)
WBC: 10.9 10*3/uL — ABNORMAL HIGH (ref 4.0–10.5)
nRBC: 0 % (ref 0.0–0.2)

## 2020-06-29 LAB — COMPREHENSIVE METABOLIC PANEL
ALT: 28 U/L (ref 0–44)
AST: 21 U/L (ref 15–41)
Albumin: 3.8 g/dL (ref 3.5–5.0)
Alkaline Phosphatase: 63 U/L (ref 38–126)
Anion gap: 11 (ref 5–15)
BUN: 9 mg/dL (ref 6–20)
CO2: 24 mmol/L (ref 22–32)
Calcium: 8.9 mg/dL (ref 8.9–10.3)
Chloride: 104 mmol/L (ref 98–111)
Creatinine, Ser: 0.66 mg/dL (ref 0.61–1.24)
GFR, Estimated: >60 mL/min (ref >60)
Glucose, Bld: 156 mg/dL — ABNORMAL HIGH (ref 70–99)
Potassium: 3.5 mmol/L (ref 3.5–5.1)
Sodium: 139 mmol/L (ref 135–145)
Total Bilirubin: 2 mg/dL — ABNORMAL HIGH (ref 0.3–1.2)
Total Protein: 7.3 g/dL (ref 6.5–8.1)

## 2020-06-29 LAB — URINALYSIS, COMPLETE (UACMP) WITH MICROSCOPIC
Bacteria, UA: NONE SEEN
Bilirubin Urine: NEGATIVE
Glucose, UA: NEGATIVE mg/dL
Hgb urine dipstick: NEGATIVE
Ketones, ur: NEGATIVE mg/dL
Leukocytes,Ua: NEGATIVE
Nitrite: NEGATIVE
Protein, ur: NEGATIVE mg/dL
Specific Gravity, Urine: >1.046 — ABNORMAL HIGH (ref 1.005–1.030)
Squamous Epithelial / HPF: NONE SEEN (ref 0–5)
pH: 6 (ref 5.0–8.0)

## 2020-06-29 LAB — LIPASE, BLOOD: Lipase: 30 U/L (ref 11–51)

## 2020-06-29 LAB — RESP PANEL BY RT-PCR (FLU A&B, COVID) ARPGX2
Influenza A by PCR: NEGATIVE
Influenza B by PCR: NEGATIVE
SARS Coronavirus 2 by RT PCR: NEGATIVE

## 2020-06-29 LAB — C DIFFICILE QUICK SCREEN W PCR REFLEX
C Diff antigen: NEGATIVE
C Diff interpretation: NOT DETECTED
C Diff toxin: NEGATIVE

## 2020-06-29 LAB — LACTIC ACID, PLASMA: Lactic Acid, Venous: 1.1 mmol/L (ref 0.5–1.9)

## 2020-06-29 MED ORDER — ONDANSETRON HCL 4 MG PO TABS: 4.0000 mg | ORAL_TABLET | Freq: Four times a day (QID) | ORAL | Status: DC | PRN

## 2020-06-29 MED ORDER — METOCLOPRAMIDE HCL 5 MG/ML IJ SOLN
5.0000 mg | Freq: Once | INTRAMUSCULAR | Status: AC
  Administered 2020-06-29: 5 mg via INTRAVENOUS
  Filled 2020-06-29: qty 2

## 2020-06-29 MED ORDER — ALBUTEROL SULFATE (2.5 MG/3ML) 0.083% IN NEBU: 2.5000 mg | INHALATION_SOLUTION | RESPIRATORY_TRACT | Status: DC | PRN

## 2020-06-29 MED ORDER — LORATADINE 10 MG PO TABS
10.0000 mg | ORAL_TABLET | Freq: Every day | ORAL | Status: DC
  Administered 2020-06-30 – 2020-07-01 (×2): 10 mg via ORAL
  Filled 2020-06-29 (×3): qty 1

## 2020-06-29 MED ORDER — SODIUM CHLORIDE 0.9 % IV SOLN: INTRAVENOUS | Status: DC

## 2020-06-29 MED ORDER — IOHEXOL 300 MG/ML  SOLN
100.0000 mL | Freq: Once | INTRAMUSCULAR | Status: AC | PRN
  Administered 2020-06-29: 100 mL via INTRAVENOUS

## 2020-06-29 MED ORDER — LISINOPRIL 2.5 MG PO TABS
2.5000 mg | ORAL_TABLET | Freq: Two times a day (BID) | ORAL | Status: DC
  Administered 2020-06-29: 2.5 mg via ORAL
  Filled 2020-06-29 (×3): qty 1

## 2020-06-29 MED ORDER — ONDANSETRON HCL 4 MG/2ML IJ SOLN
4.0000 mg | Freq: Four times a day (QID) | INTRAMUSCULAR | Status: DC | PRN
  Administered 2020-06-30 (×2): 4 mg via INTRAVENOUS
  Filled 2020-06-29 (×2): qty 2

## 2020-06-29 MED ORDER — ONDANSETRON HCL 4 MG/2ML IJ SOLN
4.0000 mg | Freq: Once | INTRAMUSCULAR | Status: AC
  Administered 2020-06-29: 4 mg via INTRAVENOUS
  Filled 2020-06-29: qty 2

## 2020-06-29 MED ORDER — ALBUTEROL SULFATE HFA 108 (90 BASE) MCG/ACT IN AERS
2.0000 | INHALATION_SPRAY | Freq: Four times a day (QID) | RESPIRATORY_TRACT | Status: DC | PRN
  Filled 2020-06-29: qty 6.7

## 2020-06-29 MED ORDER — HEPARIN SODIUM (PORCINE) 5000 UNIT/ML IJ SOLN
5000.0000 [IU] | Freq: Three times a day (TID) | INTRAMUSCULAR | Status: DC
  Administered 2020-06-29 – 2020-07-01 (×6): 5000 [IU] via SUBCUTANEOUS
  Filled 2020-06-29 (×6): qty 1

## 2020-06-29 MED ORDER — ACETAMINOPHEN 325 MG PO TABS
650.0000 mg | ORAL_TABLET | Freq: Four times a day (QID) | ORAL | Status: DC | PRN
  Administered 2020-06-30: 650 mg via ORAL
  Filled 2020-06-29: qty 2

## 2020-06-29 MED ORDER — ACETAMINOPHEN 650 MG RE SUPP: 650.0000 mg | Freq: Four times a day (QID) | RECTAL | Status: DC | PRN

## 2020-06-29 MED ORDER — SODIUM CHLORIDE 0.9 % IV BOLUS
500.0000 mL | Freq: Once | INTRAVENOUS | Status: AC
  Administered 2020-06-29: 500 mL via INTRAVENOUS

## 2020-06-29 NOTE — ED Notes (Addendum)
Pt to ED c/o abdominal pain and "dizzy spells" (since "a few weeks ago", denies any falls). Abdominal pain started about 3d ago, across mid upper abdomen. Vomited once this morning, once last week. Vomitus appeared normal to him. Pt in NAD at this time. Rates abdominal pain as 6/10. Denies CP. Pt has implanted pacemaker/defibrilator.

## 2020-06-29 NOTE — ED Triage Notes (Signed)
Pt comes with c/o abdominal pian, N/V/D. Pt states this started 3 days ago.

## 2020-06-29 NOTE — H&P (Signed)
History and Physical    James [name] GQQ:[id number] DOB: 1965-05-07 DOA: 06/29/2020  PCP: Leone Haven, MD  Patient coming from: home I have personally briefly reviewed patient's old medical records in [place]  Chief Complaint: n/v/d/abdominal pain x 3 days  HPI: James [name] is a 55 y.o. male with medical history significant of   myotonic dystrophy type I, cardiomyopathy status post AICD, sleep apnea, GERD and aspiration pneumonia,prior hx of partial bowel obstriction vs ileus 7/21. Patient also in 7/21 during that hospitalization was diagnosed with enteritis and was discharged on course of antibiotics. Per notes if patient were to have recurrent symptoms per gi furhter work up was warranted,such as CT enterography vs MR enterography vs video capsule endoscopy.Patient presents to ed with one week of mid to lower abdominal pain with associated diarrhea. He noted no exacerbating or alleviating factors. He notes over the last week he has has night time symptoms as well. He notes associated nausea and episodes of emesis. He denies, fever/chills/ increase sob/ chest pain , but notes increase fatigue and weakness. Presents to ed today due to progressive symptoms.   ED Course:  Patient not presents with similar episode as in 7/21 and on evaluation in ED on CTabd/pelvis notes: Overall, the appearance of the small bowel and colon isconcerning for inflammatory bowel disease such as Crohn's disease. Patient was discussed with gi who will evaluate patient in am. Patient due to in ability tolerate po is admitted for supportive care and further gi evaluation.   Vitals:  bp 101/76, hr:98, rr18sat 93% Respiratory panel neg c-dif negative UA:neg EKG: paced   Review of Systems: As per HPI otherwise 10 point review of systems negative.   Past Medical History:  Diagnosis Date  . Bronchitis   . Cardiomyopathy ([place])   . Dysphagia   . GERD (gastroesophageal reflux disease)   . History of  chicken pox   . History of pneumonia 2013   double PNA at [facility]  . LBBB (left bundle branch block)   . Myotonic dystrophy, type 1 ([place])    followed by [facility]  . Pneumonia    aspiration  . Sleep apnea     Past Surgical History:  Procedure Laterality Date  . CARDIAC DEFIBRILLATOR PLACEMENT     defib/pacer  . CARDIAC DEFIBRILLATOR PLACEMENT    . COLONOSCOPY WITH PROPOFOL N/A 08/16/2016   Procedure: COLONOSCOPY WITH PROPOFOL;  Surgeon: Jonathon Bellows, MD;  Location: [facility] ENDOSCOPY;  Service: Gastroenterology;  Laterality: N/A;  . COLONOSCOPY WITH PROPOFOL N/A 04/04/2017   Procedure: COLONOSCOPY WITH PROPOFOL;  Surgeon: Jonathon Bellows, MD;  Location: [facility] ENDOSCOPY;  Service: Gastroenterology;  Laterality: N/A;  . PACEMAKER IMPLANT    . US ECHOCARDIOGRAPHY  09/2010   EF 50%, mld LVH and dysfunction, mild RV dysfunction and enlarged     reports that he has never smoked. He has never used smokeless tobacco. He reports that he does not drink alcohol and does not use drugs.  Allergies  Allergen Reactions  . Codeine Nausea Only    Family History  Problem Relation Age of Onset  . Hypertension Father   . Cancer Paternal Grandfather        lung, smoker  . Cancer Paternal Uncle        bone  . Diabetes Mother   . CAD Mother 83       MI  . Stroke Neg Hx     Prior to Admission medications   Medication Sig Start  Date End Date Taking? Authorizing Provider  albuterol (PROVENTIL HFA;VENTOLIN HFA) 108 (90 Base) MCG/ACT inhaler Inhale 2 puffs into the lungs every 6 (six) hours as needed for wheezing or shortness of breath. 03/27/17   Laban Emperor, PA-C  albuterol (PROVENTIL) (2.5 MG/3ML) 0.083% nebulizer solution Take 3 mLs (2.5 mg total) by nebulization every 6 (six) hours as needed for wheezing or shortness of breath. 03/31/17   Leone Haven, MD  lisinopril (PRINIVIL,ZESTRIL) 5 MG tablet Take 2.5 mg by mouth 2 (two) times daily.     [provider]  loratadine (CLARITIN) 10 MG tablet  Take 1 tablet (10 mg total) by mouth daily. 04/13/15   Leone Haven, MD  Multiple Vitamin (MULTIVITAMIN) tablet Take 1 tablet by mouth daily.    [provider]  polyethylene glycol (MIRALAX MIX-IN PAX) 17 g packet Take 17 g by mouth daily as needed for mild constipation. 10/16/19   Mariel Aloe, MD    Physical Exam: Vitals:   06/29/20 1930 06/29/20 2000 06/29/20 2130 06/29/20 2200  BP: 113/76 101/76 107/67 99/74  Pulse: 96 98 86 86  Resp: 20 18 (!) 24 20  Temp:      TempSrc:      SpO2: 92% 93% 92% 93%  Weight:      Height:         Vitals:   06/29/20 1930 06/29/20 2000 06/29/20 2130 06/29/20 2200  BP: 113/76 101/76 107/67 99/74  Pulse: 96 98 86 86  Resp: 20 18 (!) 24 20  Temp:      TempSrc:      SpO2: 92% 93% 92% 93%  Weight:      Height:      Constitutional: NAD, calm, comfortable Eyes: PERRL, lids and conjunctivae normal ENMT: Mucous membranes are moist. Posterior pharynx clear of any exudate or lesions.Normal dentition.  Neck: normal, supple, no masses, no thyromegaly Respiratory: clear to auscultation bilaterally, no wheezing, no crackles. Normal respiratory effort. No accessory muscle use.  Cardiovascular: Regular rate and rhythm, no murmurs / rubs / gallops. No extremity edema. 2+ pedal pulses Abdomen: mild mid abdominal tenderness, mild distention, no masses palpated. No hepatosplenomegaly. Bowel sounds positive.  Musculoskeletal: no clubbing / cyanosis. No joint deformity upper and lower extremities. Good ROM, no contractures. Normal muscle tone.  Skin: no rashes, lesions, ulcers. No induration Neurologic: CN 2-12 grossly intact. Sensation intact, Strength 5/5 in all 4.  Psychiatric: Normal judgment and insight. Alert and oriented x 3. Normal mood.    Labs on Admission: I have personally reviewed following labs and imaging studies  CBC: Recent Labs  Lab 06/29/20 1505  WBC 10.9*  HGB 16.3  HCT 48.1  MCV 94.7  PLT 248   Basic Metabolic  Panel: Recent Labs  Lab 06/29/20 1505  NA 139  K 3.5  CL 104  CO2 24  GLUCOSE 156*  BUN 9  CREATININE 0.66  CALCIUM 8.9   GFR: Estimated Creatinine Clearance: 129.7 mL/min (by C-G formula based on SCr of 0.66 mg/dL). Liver Function Tests: Recent Labs  Lab 06/29/20 1505  AST 21  ALT 28  ALKPHOS 63  BILITOT 2.0*  PROT 7.3  ALBUMIN 3.8   Recent Labs  Lab 06/29/20 1505  LIPASE 30   No results for input(s): AMMONIA in the last 168 hours. Coagulation Profile: No results for input(s): INR, PROTIME in the last 168 hours. Cardiac Enzymes: No results for input(s): CKTOTAL, CKMB, CKMBINDEX, TROPONINI in the last 168 hours. BNP (last 3  results) No results for input(s): PROBNP in the last 8760 hours. HbA1C: No results for input(s): HGBA1C in the last 72 hours. CBG: No results for input(s): GLUCAP in the last 168 hours. Lipid Profile: No results for input(s): CHOL, HDL, LDLCALC, TRIG, CHOLHDL, LDLDIRECT in the last 72 hours. Thyroid Function Tests: No results for input(s): TSH, T4TOTAL, FREET4, T3FREE, THYROIDAB in the last 72 hours. Anemia Panel: No results for input(s): VITAMINB12, FOLATE, FERRITIN, TIBC, IRON, RETICCTPCT in the last 72 hours. Urine analysis:    Component Value Date/Time   COLORURINE YELLOW (A) 06/29/2020 1603   APPEARANCEUR CLEAR (A) 06/29/2020 1603   APPEARANCEUR Clear 01/23/2014 1256   LABSPEC >1.046 (H) 06/29/2020 1603   LABSPEC 1.006 01/23/2014 1256   PHURINE 6.0 06/29/2020 1603   GLUCOSEU NEGATIVE 06/29/2020 1603   GLUCOSEU Negative 01/23/2014 1256   HGBUR NEGATIVE 06/29/2020 1603   BILIRUBINUR NEGATIVE 06/29/2020 1603   BILIRUBINUR Negative 01/23/2014 1256   Ray 06/29/2020 1603   PROTEINUR NEGATIVE 06/29/2020 1603   NITRITE NEGATIVE 06/29/2020 1603   LEUKOCYTESUR NEGATIVE 06/29/2020 1603   LEUKOCYTESUR Negative 01/23/2014 1256    Radiological Exams on Admission: CT Head Wo Contrast  Result Date: 06/29/2020 CLINICAL  DATA:  Dizziness. EXAM: CT HEAD WITHOUT CONTRAST TECHNIQUE: Contiguous axial images were obtained from the base of the skull through the vertex without intravenous contrast. COMPARISON:  [facility] 12/15/2013. FINDINGS: Brain: No evidence of acute infarction, hemorrhage, hydrocephalus, extra-axial collection or mass lesion/mass effect. Mild to moderate patchy white matter hypoattenuation, nonspecific but most likely related to chronic microvascular ischemic disease. Vascular: No hyperdense vessel identified Skull: No acute fracture. No substantial change in a partially calcified right parietal scalp lesion, likely benign given long-term stability. Sinuses/Orbits: Mild ethmoid air cell mucosal thickening. Otherwise, visualized sinuses are clear. Unremarkable orbits. Other: No mastoid effusions. IMPRESSION: 1. No evidence of acute intracranial abnormality. 2. Mild to moderate chronic microvascular ischemic disease, progressed from prior. Electronically Signed   By: Margaretha Sheffield MD   On: 06/29/2020 16:51   CT ABDOMEN PELVIS W CONTRAST  Result Date: 06/29/2020 CLINICAL DATA:  55 year old male with history of abdominal pain and dizziness. EXAM: CT ABDOMEN AND PELVIS WITH CONTRAST TECHNIQUE: Multidetector CT imaging of the abdomen and pelvis was performed using the standard protocol following bolus administration of intravenous contrast. CONTRAST:  123m OMNIPAQUE IOHEXOL 300 MG/ML  SOLN COMPARISON:  CT the abdomen and pelvis 10/13/2019. FINDINGS: Lower chest: Linear scarring in the lower lobes of the lungs bilaterally. 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 Hepatobiliary: Diffuse low attenuation throughout the visualized hepatic parenchyma, indicative of hepatic steatosis. No suspicious cystic or solid hepatic lesions. No intra or extrahepatic biliary ductal dilatation. Several small calcified gallstones are  noted within the lumen of the gallbladder which is completely decompressed around the gallstones. No surrounding inflammatory changes. Pancreas: No pancreatic mass. No pancreatic ductal dilatation. No pancreatic or peripancreatic fluid collections or inflammatory changes. Spleen: Unremarkable. Adrenals/Urinary Tract: 4 mm nonobstructive calculus in the lower pole collecting system of left kidney. Multiple low-attenuation lesions in both kidneys, compatible with simple cysts, largest of which is exophytic in the upper pole of the left kidney measuring 4.2 cm in diameter. Bilateral adrenal glands are normal in appearance. No hydroureteronephrosis. Urinary bladder is normal in appearance. Stomach/Bowel: Normal appearance of the stomach. Multiple prominent borderline dilated and mildly dilated loops of small bowel most evident throughout the mid  small bowel, measuring up to 4.4 cm in diameter. Within the dilated loops of small bowel, there are areas of mural thickening and mucosal hyperenhancement best appreciated on axial image 58 of series 3. Several additional areas of mural thickening and mucosal hyperenhancement are also noted elsewhere throughout the small bowel and colon, including the distal and terminal ileum which is completely decompressed, hepatic flexure of the colon, several regions in the transverse colon, and distal sigmoid colon and rectum. Normal appendix. Vascular/Lymphatic: No significant atherosclerotic disease, aneurysm or dissection noted in the abdominal or pelvic vasculature. Retroaortic left renal vein (normal anatomical variant) incidentally noted. No lymphadenopathy noted in the abdomen or pelvis. Reproductive: Prostate gland and seminal vesicles are unremarkable in appearance. Other: No significant volume of ascites.  No pneumoperitoneum. Musculoskeletal: There are no aggressive appearing lytic or blastic lesions noted in the visualized portions of the skeleton. IMPRESSION: 1. Overall, the  appearance of the small bowel and colon is concerning for inflammatory bowel disease such as Crohn's disease, as detailed above. Further clinical evaluation is recommended. 2. Hepatic steatosis. 3. 4 mm nonobstructive calculus in the lower pole collecting system of left kidney. 4. Cholelithiasis without evidence of acute cholecystitis at this time. 5. Additional incidental findings, as above. Electronically Signed   By: Vinnie Langton M.D.   On: 06/29/2020 17:02   US ABDOMEN LIMITED RUQ (LIVER/GB)  Result Date: 06/29/2020 CLINICAL DATA:  Right upper quadrant pain for 3 days. EXAM: ULTRASOUND ABDOMEN LIMITED RIGHT UPPER QUADRANT COMPARISON:  CT 06/29/2020 FINDINGS: Gallbladder: Contracted gallbladder with multiple stones. No wall thickening or edema. Murphy's sign is negative. Common bile duct: Diameter: 1.5 mm, normal Liver: Diffusely increased echotexture suggesting fatty infiltration. No focal lesions are identified. Portal vein is patent on color Doppler imaging with normal direction of blood flow towards the liver. Other: None. IMPRESSION: Cholelithiasis without additional changes to suggest acute cholecystitis. Probable diffuse fatty infiltration of the liver. Electronically Signed   By: Lucienne Capers M.D.   On: 06/29/2020 19:09    EKG: Independently reviewed. 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

## 2020-06-29 NOTE — ED Notes (Signed)
EDP at bedside.  Provided phone to pt.

## 2020-06-29 NOTE — ED Provider Notes (Signed)
[facility] Emergency Department Provider Note  ____________________________________________   Event Date/Time   First MD Initiated Contact with Patient 06/29/20 1525     (approximate)  I have reviewed the triage vital signs and the nursing notes.   HISTORY  Chief Complaint Abdominal Pain    HPI James [name] is a 55 y.o. male with cardiomyopathy with pacemaker, muscular dystrophy who comes in for abdominal pain.  Patient reports having 1 week of abdominal pain that is in the middle of his abdomen, constant, nothing makes better, nothing makes it worse.  He states he has had few episodes of vomiting in the setting of some nausea.  States that he is just vomiting up his food.  Denies any blood or bile.  He also reports having some diarrhea as well.  Last episode was today, 2 times.  Denies any blood in it.  On review of records patient had admission 6 months ago secondary to small bowel obstruction that was partial versus ileus.  Patient also does report having some dizziness and having a fall but he states he thinks the fall was a month ago but is not 100% sure.  Patient denies any chest pain, shortness of breath or leg swelling.  He states that his oxygen levels are usually 93%.           Past Medical History:  Diagnosis Date  . Bronchitis   . Cardiomyopathy ([place])   . Dysphagia   . GERD (gastroesophageal reflux disease)   . History of chicken pox   . History of pneumonia 2013   double PNA at [facility]  . LBBB (left bundle branch block)   . Myotonic dystrophy, type 1 ([place])    followed by [facility]  . Pneumonia    aspiration  . Sleep apnea     Patient Active Problem List   Diagnosis Date Noted  . Chronic cough 11/22/2019  . Hypernatremia 10/22/2019  . Partial small bowel obstruction ([place]) 10/13/2019  . Numbness 02/16/2018  . Constipation 02/16/2018  . Avascular necrosis of hip, unspecified laterality ([place]) 11/08/2017  . Fall 11/08/2017  . Positive  colorectal cancer screening using Cologuard test 03/31/2017  . ICD (implantable cardioverter-defibrillator) in place 09/23/2016  . Prediabetes 12/01/2015  . Heart palpitations 09/25/2015  . Obstructive sleep apnea 06/26/2015  . Rotator cuff tendinitis 05/22/2015  . Dysphagia   . 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 Male hypogonadism 06/01/2013  . Cardiomyopathy ([place])   . Left bundle branch block   . Myotonic dystrophy, type 1 ([place])     Past Surgical History:  Procedure Laterality Date  . CARDIAC DEFIBRILLATOR PLACEMENT     defib/pacer  . CARDIAC DEFIBRILLATOR PLACEMENT    . COLONOSCOPY WITH PROPOFOL N/A 08/16/2016   Procedure: COLONOSCOPY WITH PROPOFOL;  Surgeon: Jonathon Bellows, MD;  [facility];  Service: Gastroenterology;  Laterality: N/A;  . COLONOSCOPY WITH PROPOFOL N/A 04/04/2017   Procedure: COLONOSCOPY WITH PROPOFOL;  Surgeon: Jonathon Bellows, MD;  Location: [facility] ENDOSCOPY;  Service: Gastroenterology;  Laterality: N/A;  . PACEMAKER IMPLANT    . US ECHOCARDIOGRAPHY  09/2010   EF 50%, mld LVH and dysfunction, mild RV dysfunction and enlarged    Prior to Admission medications   Medication Sig Start Date End Date Taking? Authorizing Provider  albuterol (PROVENTIL HFA;VENTOLIN HFA) 108 (90 Base) MCG/ACT inhaler Inhale 2 puffs into the lungs every 6 (six) hours as  needed for wheezing or shortness of breath. 03/27/17   Laban Emperor, PA-C  albuterol (PROVENTIL) (2.5 MG/3ML) 0.083% nebulizer solution Take 3 mLs (2.5 mg total) by nebulization every 6 (six) hours as needed for wheezing or shortness of breath. 03/31/17   Leone Haven, MD  lisinopril (PRINIVIL,ZESTRIL) 5 MG tablet Take 2.5 mg by mouth 2 (two) times daily.     [provider]  loratadine (CLARITIN) 10 MG tablet Take 1 tablet (10 mg total) by mouth daily. 04/13/15   Leone Haven, MD  Multiple Vitamin  (MULTIVITAMIN) tablet Take 1 tablet by mouth daily.    [provider]  polyethylene glycol (MIRALAX MIX-IN PAX) 17 g packet Take 17 g by mouth daily as needed for mild constipation. 10/16/19   Mariel Aloe, MD    Allergies Codeine  Family History  Problem Relation Age of Onset  . Hypertension Father   . Cancer Paternal Grandfather        lung, smoker  . Cancer Paternal Uncle        bone  . Diabetes Mother   . CAD Mother 73       MI  . Stroke Neg Hx     Social History Social History   Tobacco Use  . Smoking status: Never Smoker  . Smokeless tobacco: Never Used  Vaping Use  . Vaping Use: Never used  Substance Use Topics  . Alcohol use: No  . Drug use: No      Review of Systems Constitutional: No fever/chills, dizziness Eyes: No visual changes. ENT: No sore throat. Cardiovascular: Denies chest pain. Respiratory: Denies shortness of breath. Gastrointestinal: Positive abdominal pain, nausea, vomiting, diarrhea Genitourinary: Negative for dysuria. Musculoskeletal: Negative for back pain. Skin: Negative for rash. Neurological: Negative for headaches, focal weakness or numbness. All other ROS negative ____________________________________________   PHYSICAL EXAM:  VITAL SIGNS: ED Triage Vitals  Enc Vitals Group     BP 06/29/20 1503 111/84     Pulse Rate 06/29/20 1503 98     Resp 06/29/20 1503 18     Temp 06/29/20 1503 98.6 F (37 C)     Temp Source 06/29/20 1503 Oral     SpO2 06/29/20 1503 92 %     Weight 06/29/20 1504 230 lb (104.3 kg)     Height 06/29/20 1504 5\' 11"  (1.803 m)     Head Circumference --      Peak Flow --      Pain Score 06/29/20 1503 5     Pain Loc --      Pain Edu? --      Excl. in [place]? --     Constitutional: Alert and oriented. Well appearing and in no acute distress. Eyes: Conjunctivae are normal. EOMI. Head: Atraumatic. Nose: No congestion/rhinnorhea. Mouth/Throat: Mucous membranes are moist.   Neck: No stridor.  Trachea Midline. FROM Cardiovascular: Normal rate, regular rhythm. Grossly normal heart sounds.  Good peripheral circulation. Respiratory: Normal respiratory effort.  No retractions. Lungs CTAB. Gastrointestinal: Tender in his abdomen no distention. No abdominal bruits.  Musculoskeletal: Some contractures noted to his muscle secondary to his baseline muscular dystrophy.  No joint effusions. Neurologic: Speech is slow,. No gross focal neurologic deficits are appreciated.  Skin:  Skin is warm, dry and intact. No rash noted. Psychiatric: Mood and affect are normal.  Speech is slow GU: Deferred   ____________________________________________   LABS (all labs ordered are listed, but only abnormal results are displayed)  Labs Reviewed  CBC -  Abnormal; Notable for the following components:      Result Value   WBC 10.9 (*)    All other components within normal limits  LIPASE, BLOOD  COMPREHENSIVE METABOLIC PANEL  URINALYSIS, COMPLETE (UACMP) WITH MICROSCOPIC   ____________________________________________   ED ECG REPORT I, Vanessa Menard, the attending physician, personally viewed and interpreted this ECG.  EKG is a paced rhythm therefore has a widened QRS, sinus, T wave inversion in V2 and aVL, negative scar Bosa.  Compared to his prior EKG and looks at baseline ____________________________________________  RADIOLOGY  Official radiology report(s): CT Head Wo Contrast  Result Date: 06/29/2020 CLINICAL DATA:  Dizziness. EXAM: CT HEAD WITHOUT CONTRAST TECHNIQUE: Contiguous axial images were obtained from the base of the skull through the vertex without intravenous contrast. COMPARISON:  [facility] 12/15/2013. FINDINGS: Brain: No evidence of acute infarction, hemorrhage, hydrocephalus, extra-axial collection or mass lesion/mass effect. Mild to moderate patchy white matter hypoattenuation, nonspecific but most likely related to chronic microvascular ischemic disease. Vascular: No hyperdense  vessel identified Skull: No acute fracture. No substantial change in a partially calcified right parietal scalp lesion, likely benign given long-term stability. Sinuses/Orbits: Mild ethmoid air cell mucosal thickening. Otherwise, visualized sinuses are clear. Unremarkable orbits. Other: No mastoid effusions. IMPRESSION: 1. No evidence of acute intracranial abnormality. 2. Mild to moderate chronic microvascular ischemic disease, progressed from prior. Electronically Signed   By: Margaretha Sheffield MD   On: 06/29/2020 16:51   CT ABDOMEN PELVIS W CONTRAST  Result Date: 06/29/2020 CLINICAL DATA:  55 year old male with history of abdominal pain and dizziness. EXAM: CT ABDOMEN AND PELVIS WITH CONTRAST TECHNIQUE: Multidetector CT imaging of the abdomen and pelvis was performed using the standard protocol following bolus administration of intravenous contrast. CONTRAST:  159mL OMNIPAQUE IOHEXOL 300 MG/ML  SOLN COMPARISON:  CT the abdomen and pelvis 10/13/2019. FINDINGS: Lower chest: Linear scarring in the lower lobes of the lungs bilaterally. 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 Hepatobiliary: Diffuse low attenuation throughout the visualized hepatic parenchyma, indicative of hepatic steatosis. No suspicious cystic or solid hepatic lesions. No intra or extrahepatic biliary ductal dilatation. Several small calcified gallstones are noted within the lumen of the gallbladder which is completely decompressed around the gallstones. No surrounding inflammatory changes. Pancreas: No pancreatic mass. No pancreatic ductal dilatation. No pancreatic or peripancreatic fluid collections or inflammatory changes. Spleen: Unremarkable. Adrenals/Urinary Tract: 4 mm nonobstructive calculus in the lower pole collecting system of left kidney. Multiple low-attenuation lesions in both kidneys, compatible with simple cysts, largest of  which is exophytic in the upper pole of the left kidney measuring 4.2 cm in diameter. Bilateral adrenal glands are normal in appearance. No hydroureteronephrosis. Urinary bladder is normal in appearance. Stomach/Bowel: Normal appearance of the stomach. Multiple prominent borderline dilated and mildly dilated loops of small bowel most evident throughout the mid small bowel, measuring up to 4.4 cm in diameter. Within the dilated loops of small bowel, there are areas of mural thickening and mucosal hyperenhancement best appreciated on axial image 58 of series 3. Several additional areas of mural thickening and mucosal hyperenhancement are also noted elsewhere throughout the small bowel and colon, including the distal and terminal ileum which is completely decompressed, hepatic flexure of the colon, several regions in the transverse colon, and distal sigmoid colon and rectum. Normal appendix. Vascular/Lymphatic: No significant atherosclerotic disease, aneurysm or dissection noted in the abdominal or pelvic vasculature. Retroaortic  left renal vein (normal anatomical variant) incidentally noted. No lymphadenopathy noted in the abdomen or pelvis. Reproductive: Prostate gland and seminal vesicles are unremarkable in appearance. Other: No significant volume of ascites.  No pneumoperitoneum. Musculoskeletal: There are no aggressive appearing lytic or blastic lesions noted in the visualized portions of the skeleton. IMPRESSION: 1. Overall, the appearance of the small bowel and colon is concerning for inflammatory bowel disease such as Crohn's disease, as detailed above. Further clinical evaluation is recommended. 2. Hepatic steatosis. 3. 4 mm nonobstructive calculus in the lower pole collecting system of left kidney. 4. Cholelithiasis without evidence of acute cholecystitis at this time. 5. Additional incidental findings, as above. Electronically Signed   By: Vinnie Langton M.D.   On: 06/29/2020 17:02     ____________________________________________   PROCEDURES  Procedure(s) performed (including Critical Care):  .1-3 Lead EKG Interpretation Performed by: Vanessa Marianna, MD Authorized by: Vanessa Finlayson, MD     Interpretation: normal     ECG rate:  90s    ECG rate assessment: normal     Rhythm: paced     Ectopy: none     Conduction: normal       ____________________________________________   INITIAL IMPRESSION / ASSESSMENT AND PLAN / ED [place] A James [name] was evaluated in Emergency Department on 06/29/2020 for the symptoms described in the history of present illness. He was evaluated in the context of the global COVID-19 pandemic, which necessitated consideration that the patient might be at risk for infection with the SARS-CoV-2 virus that causes COVID-19. Institutional protocols and algorithms that pertain to the evaluation of patients at risk for COVID-19 are in a state of rapid change based on information released by regulatory bodies including the CDC and federal and state organizations. These policies and algorithms were followed during the patient's care in the ED.    Patient is a 55 year old who comes in for abdominal pain, nausea, vomiting, diarrhea.  Labs ordered to evaluate for Electra abnormalities, AKI, urine evaluate for UTI.  Will get CT scan to evaluate for small bowel obstruction versus ileus and stool studies given significant stooling.  No antibiotics recently so I will suspicion for C. difficile.  Patient also requested a CT scan of his head.  He states that he feels dizzy.  Suspect it is most likely just secondary from some dehydration will give him some fluids but will get CT head to make sure no evidence of hydrocephalus, mass or other acute concerns.  Labs are reassuring although the T bili is slightly elevated to 2.0 however on review 8 months ago it was 1.8.  CT head was negative.  The CT abdomen is concerning for some inflammatory bowel disease and there  is evidence of hepatic steatosis which could explain patient's T bili elevation.  He also has cholelithiasis but no evidence of acute cholecystitis.    Is difficult to get a great history on exactly where patient's pain is.  Patient is very soft-spoken and does not really able to express himself very well but it sounds like his pain is mostly epigastric.  Will get ultrasound to make sure is no signs of cholecystitis.  Wife is not at bedside and states that she has been giving Zofran at home without much effect and he still been having a lot of vomiting and diarrhea at home.  I did review patient's prior discharge summary where he had enteritis and was seen by GI and they had put him on rifaximin but  he was unable to afford it and so he got Cipro and Flagyl.  They recommended a CT enterography versus MR enterography versus video capsule endoscopy should his symptoms recur.  This time family does not feel comfortable following up outpatient with GI follow-up given the recurrent symptoms not getting better with Zofran.  Will get an ultrasound to make sure no evidence of cholecystitis and if negative will discuss with hospital team for admission.  To note patient was placed on 2 L of oxygen given when he was falling asleep his oxygen levels were slightly go down.  Wife is at bedside who states that he does have sleep apnea and is on BiPAP at nighttime.  He denies any shortness of breath and they are not concerned about this and declined chest x-ray imaging at this time  I discussed with GI Dr. Haig Prophet and they will see him tomorrow.  ____________________________________________   FINAL CLINICAL IMPRESSION(S) / ED DIAGNOSES   Final diagnoses:  RUQ pain  Nausea vomiting and diarrhea      MEDICATIONS GIVEN DURING THIS VISIT:  Medications  sodium chloride 0.9 % bolus 500 mL (0 mLs Intravenous Stopped 06/29/20 1715)  ondansetron (ZOFRAN) injection 4 mg (4 mg Intravenous Given 06/29/20 1601)  iohexol  (OMNIPAQUE) 300 MG/ML solution 100 mL (100 mLs Intravenous Contrast Given 06/29/20 1621)  metoCLOPramide (REGLAN) injection 5 mg (5 mg Intravenous Given 06/29/20 1808)     ED Discharge Orders    None       Note:  This document was prepared using Dragon voice recognition software and may include unintentional dictation errors.   Vanessa [place], MD 06/29/20 [phone]

## 2020-06-29 NOTE — ED Notes (Signed)
Blood appears to have been drawn in triage. Marked off on bloodwork.

## 2020-06-29 NOTE — ED Notes (Signed)
Urinal provided for urine sample.  Wife at bedside.

## 2020-06-30 ENCOUNTER — Encounter: Payer: Self-pay | Admitting: Internal Medicine

## 2020-06-30 DIAGNOSIS — R1310 Dysphagia, unspecified: Secondary | ICD-10-CM | POA: Diagnosis present

## 2020-06-30 DIAGNOSIS — G4733 Obstructive sleep apnea (adult) (pediatric): Secondary | ICD-10-CM | POA: Diagnosis present

## 2020-06-30 DIAGNOSIS — R112 Nausea with vomiting, unspecified: Secondary | ICD-10-CM

## 2020-06-30 DIAGNOSIS — E876 Hypokalemia: Secondary | ICD-10-CM

## 2020-06-30 DIAGNOSIS — R1011 Right upper quadrant pain: Secondary | ICD-10-CM

## 2020-06-30 DIAGNOSIS — K529 Noninfective gastroenteritis and colitis, unspecified: Secondary | ICD-10-CM | POA: Diagnosis present

## 2020-06-30 DIAGNOSIS — K5140 Inflammatory polyps of colon without complications: Secondary | ICD-10-CM | POA: Diagnosis not present

## 2020-06-30 DIAGNOSIS — I10 Essential (primary) hypertension: Secondary | ICD-10-CM | POA: Diagnosis present

## 2020-06-30 DIAGNOSIS — Z79899 Other long term (current) drug therapy: Secondary | ICD-10-CM | POA: Diagnosis not present

## 2020-06-30 DIAGNOSIS — Z8701 Personal history of pneumonia (recurrent): Secondary | ICD-10-CM | POA: Diagnosis not present

## 2020-06-30 DIAGNOSIS — K635 Polyp of colon: Secondary | ICD-10-CM | POA: Diagnosis present

## 2020-06-30 DIAGNOSIS — D126 Benign neoplasm of colon, unspecified: Secondary | ICD-10-CM | POA: Diagnosis not present

## 2020-06-30 DIAGNOSIS — Z8249 Family history of ischemic heart disease and other diseases of the circulatory system: Secondary | ICD-10-CM | POA: Diagnosis not present

## 2020-06-30 DIAGNOSIS — I447 Left bundle-branch block, unspecified: Secondary | ICD-10-CM | POA: Diagnosis present

## 2020-06-30 DIAGNOSIS — K8020 Calculus of gallbladder without cholecystitis without obstruction: Secondary | ICD-10-CM | POA: Diagnosis present

## 2020-06-30 DIAGNOSIS — K219 Gastro-esophageal reflux disease without esophagitis: Secondary | ICD-10-CM | POA: Diagnosis present

## 2020-06-30 DIAGNOSIS — R197 Diarrhea, unspecified: Secondary | ICD-10-CM | POA: Diagnosis not present

## 2020-06-30 DIAGNOSIS — I429 Cardiomyopathy, unspecified: Secondary | ICD-10-CM | POA: Diagnosis present

## 2020-06-30 DIAGNOSIS — Z885 Allergy status to narcotic agent status: Secondary | ICD-10-CM | POA: Diagnosis not present

## 2020-06-30 DIAGNOSIS — Z20822 Contact with and (suspected) exposure to covid-19: Secondary | ICD-10-CM | POA: Diagnosis present

## 2020-06-30 DIAGNOSIS — Z95810 Presence of automatic (implantable) cardiac defibrillator: Secondary | ICD-10-CM | POA: Diagnosis not present

## 2020-06-30 DIAGNOSIS — G7111 Myotonic muscular dystrophy: Secondary | ICD-10-CM | POA: Diagnosis present

## 2020-06-30 LAB — CBC
HCT: 46.5 % (ref 39.0–52.0)
HCT: 45.2 % (ref 39.0–52.0)
Hemoglobin: 15.5 g/dL (ref 13.0–17.0)
Hemoglobin: 15.1 g/dL (ref 13.0–17.0)
MCH: 31.8 pg (ref 26.0–34.0)
MCH: 32.1 pg (ref 26.0–34.0)
MCHC: 33.3 g/dL (ref 30.0–36.0)
MCHC: 33.4 g/dL (ref 30.0–36.0)
MCV: 95.5 fL (ref 80.0–100.0)
MCV: 96 fL (ref 80.0–100.0)
Platelets: 230 10*3/uL (ref 150–400)
Platelets: 221 10*3/uL (ref 150–400)
RBC: 4.87 MIL/uL (ref 4.22–5.81)
RBC: 4.71 MIL/uL (ref 4.22–5.81)
RDW: 13.2 % (ref 11.5–15.5)
RDW: 13.3 % (ref 11.5–15.5)
WBC: 8.6 10*3/uL (ref 4.0–10.5)
WBC: 8.8 10*3/uL (ref 4.0–10.5)
nRBC: 0 % (ref 0.0–0.2)
nRBC: 0 % (ref 0.0–0.2)

## 2020-06-30 LAB — COMPREHENSIVE METABOLIC PANEL
ALT: 23 U/L (ref 0–44)
AST: 18 U/L (ref 15–41)
Albumin: 3.4 g/dL — ABNORMAL LOW (ref 3.5–5.0)
Alkaline Phosphatase: 60 U/L (ref 38–126)
Anion gap: 10 (ref 5–15)
BUN: 9 mg/dL (ref 6–20)
CO2: 26 mmol/L (ref 22–32)
Calcium: 8.6 mg/dL — ABNORMAL LOW (ref 8.9–10.3)
Chloride: 104 mmol/L (ref 98–111)
Creatinine, Ser: 0.77 mg/dL (ref 0.61–1.24)
GFR, Estimated: >60 mL/min (ref >60)
Glucose, Bld: 96 mg/dL (ref 70–99)
Potassium: 2.7 mmol/L — CL (ref 3.5–5.1)
Sodium: 140 mmol/L (ref 135–145)
Total Bilirubin: 1.7 mg/dL — ABNORMAL HIGH (ref 0.3–1.2)
Total Protein: 6.5 g/dL (ref 6.5–8.1)

## 2020-06-30 LAB — CREATININE, SERUM
Creatinine, Ser: 0.7 mg/dL (ref 0.61–1.24)
GFR, Estimated: >60 mL/min (ref >60)

## 2020-06-30 LAB — PROCALCITONIN
Procalcitonin: <0.1 ng/mL
Procalcitonin: <0.1 ng/mL

## 2020-06-30 LAB — LACTIC ACID, PLASMA
Lactic Acid, Venous: 1.1 mmol/L (ref 0.5–1.9)
Lactic Acid, Venous: 1.7 mmol/L (ref 0.5–1.9)

## 2020-06-30 LAB — C-REACTIVE PROTEIN: CRP: 1.5 mg/dL — ABNORMAL HIGH (ref <1.0)

## 2020-06-30 LAB — POTASSIUM: Potassium: 4.1 mmol/L (ref 3.5–5.1)

## 2020-06-30 LAB — SEDIMENTATION RATE: Sed Rate: 17 mm/hr (ref 0–20)

## 2020-06-30 MED ORDER — POTASSIUM CHLORIDE CRYS ER 20 MEQ PO TBCR
40.0000 meq | EXTENDED_RELEASE_TABLET | Freq: Once | ORAL | Status: AC
  Administered 2020-06-30: 40 meq via ORAL
  Filled 2020-06-30: qty 2

## 2020-06-30 MED ORDER — PEG 3350-KCL-NA BICARB-NACL 420 G PO SOLR
4000.0000 mL | Freq: Once | ORAL | Status: AC
  Administered 2020-06-30: 4000 mL via ORAL
  Filled 2020-06-30: qty 4000

## 2020-06-30 MED ORDER — SODIUM CHLORIDE 0.9 % IV BOLUS: 1000.0000 mL | Freq: Once | INTRAVENOUS | Status: DC

## 2020-06-30 MED ORDER — BISACODYL 5 MG PO TBEC
10.0000 mg | DELAYED_RELEASE_TABLET | Freq: Once | ORAL | Status: DC
  Filled 2020-06-30: qty 2

## 2020-06-30 MED ORDER — POTASSIUM CHLORIDE 10 MEQ/100ML IV SOLN
10.0000 meq | INTRAVENOUS | Status: AC
  Administered 2020-06-30 (×3): 10 meq via INTRAVENOUS
  Filled 2020-06-30 (×3): qty 100

## 2020-06-30 MED ORDER — SODIUM CHLORIDE 0.9 % IV SOLN: INTRAVENOUS | Status: DC

## 2020-06-30 MED ORDER — SODIUM CHLORIDE 0.9 % IV BOLUS: 250.0000 mL | Freq: Once | INTRAVENOUS | Status: DC

## 2020-06-30 NOTE — Progress Notes (Signed)
OT Cancellation Note  Patient Details Name: SHERRY ROGUS MRN: [id number] DOB: [birth year]   Cancelled Treatment:    Reason Eval/Treat Not Completed: Medical issues which prohibited therapy. Pt with K+ level 2.7, outside the recommended range for rehab services. Will continue to follow pt's course and will complete OT evaluation when pt is available and medically ready.  Josiah Lobo 06/30/2020, 12:30 PM

## 2020-06-30 NOTE — Progress Notes (Signed)
Auto cpap at bedside

## 2020-06-30 NOTE — Progress Notes (Signed)
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  Patient had a similar episode in July 2021 and he was admitted back then.  He was found to have partial small bowel obstruction versus ileus in the setting of nonspecific enteritis.  General surgery and GI consulted back then.  He was placed empirically on antibiotics and improved with dose, was treated conservatively and discharged home. Per notes if patient were to have recurrent symptoms per gi furhter work up was warranted,such as CT enterography vs MR enterography vs video capsule endoscopy.  Patient denies any recent fever or chills, no chest pain, no shortness of breath.  He underwent a CT scan in the ED which showed again bases concerning for IBD.  Subjective / 24h Interval events: Reports persistent symptoms today, but none.  He also reports increasing weakness in the last several weeks and several falls  Assessment & Plan: Principal Problem Enteritis/colitis -patient was admitted to the hospital with concerns for IBD based on the CT scan.  Gastroenterology consulted, appreciate input.  He is able to tolerate a clear liquid diet, further diet changes per GI.  He has no fever, no leukocytosis, procalcitonin is negative, lactic acid is normal and there is no need for antibiotics.  Active Problems Hypokalemia-likely in the setting of GI losses, replete aggressively and monitor.  Myotonic dystrophy type I-followed by [place] neurology, will obtain PT given reported falls and weakness  Cardiomyopathy- last echo 2020 with mild LV and RV dysfunction, he appears  euvolemic, has an ICD/pacemaker in place, closely monitor fluid status  Sleep apnea-CPAP nightly  Essential hypertension-continue home lisinopril  Scheduled Meds: . heparin  5,000 Units Subcutaneous Q8H  . lisinopril  2.5 mg Oral BID  . loratadine  10 mg Oral Daily  . potassium chloride  40 mEq Oral Once   Continuous Infusions: . potassium chloride 10 mEq (06/30/20 0852)   PRN Meds:.acetaminophen **OR** acetaminophen, albuterol, albuterol, ondansetron **OR** ondansetron (ZOFRAN) IV  Diet Orders (From admission, onward)    Start     Ordered   06/29/20 2303  Diet clear liquid Room service appropriate? Yes; Fluid consistency: Thin  Diet effective now       Question Answer Comment  Room service appropriate? Yes   Fluid consistency: Thin      06/29/20 2303          DVT prophylaxis: heparin injection 5,000 Units Start: 06/29/20 2315     Code Status: Full Code  Family Communication: wife at bedside   Status is: Observation  The patient will require care spanning > 2 midnights and should be moved to inpatient because: Inpatient level of care appropriate due to severity of illness  Dispo: The patient is from: Home              Anticipated d/c is to: Home              Patient currently is not medically stable to d/c.   Difficult to place patient No   Level of care: Med-Surg  Consultants:  GI  Procedures:  None   Microbiology  None   Antimicrobials: None  Objective: Vitals:   06/29/20 2340 06/30/20 0054 06/30/20 0428 06/30/20 0810  BP: 114/71 137/87 111/72 103/62  Pulse:  85 75 71  Resp:  20 20 20   Temp:  97.9 F (36.6 C) 98.3 F (36.8 C) 98.1 F (36.7 C)  TempSrc:  Oral Oral Oral  SpO2:  96% 91% 92%  Weight:      Height:        Intake/Output Summary (Last 24 hours) at 06/30/2020 0951 Last data filed at 06/30/2020 0855 Gross per 24 hour  Intake 628.83 ml  Output --  Net 628.83 ml   Filed Weights   06/29/20 1504  Weight: 104.3 kg     Examination:  Constitutional: NAD Eyes: no scleral icterus ENMT: Mucous membranes are moist.  Neck: normal, supple Respiratory: clear to auscultation bilaterally, no wheezing, no crackles. Normal respiratory effort.  Cardiovascular: Regular rate and rhythm, no murmurs / rubs / gallops. No LE edema. Good peripheral pulses Abdomen: non distended, mild tenderness in the epigastric area. Bowel sounds positive.  Musculoskeletal: no clubbing / cyanosis.  Skin: no rashes Neurologic: Grossly nonfocal, overall weakness Psychiatric: Normal judgment and insight. Alert and oriented x 3. Normal mood.   Data Reviewed: I have independently reviewed following labs and imaging studies   CBC: Recent Labs  Lab 06/29/20 1505 06/29/20 2324 06/30/20 0526  WBC 10.9* 8.6 8.8  HGB 16.3 15.5 15.1  HCT 48.1 46.5 45.2  MCV 94.7 95.5 96.0  PLT 263 230 629   Basic Metabolic Panel: Recent Labs  Lab 06/29/20 1505 06/29/20 2324 06/30/20 0526  NA 139  --  140  K 3.5  --  2.7*  CL 104  --  104  CO2 24  --  26  GLUCOSE 156*  --  96  BUN 9  --  9  CREATININE 0.66 0.70 0.77  CALCIUM 8.9  --  8.6*   Liver Function Tests: Recent Labs  Lab 06/29/20 1505 06/30/20 0526  AST 21 18  ALT 28 23  ALKPHOS 63 60  BILITOT 2.0* 1.7*  PROT 7.3 6.5  ALBUMIN 3.8 3.4*   Coagulation Profile: No results for input(s): INR, PROTIME in the last 168 hours. HbA1C: No results for input(s): HGBA1C in the last 72 hours. CBG: No results for input(s): GLUCAP in the last 168 hours.  Recent Results (from the past 240 hour(s))  Resp Panel by RT-PCR (Flu A&B, Covid) Nasopharyngeal Swab     Status: None   Collection Time: 06/29/20  4:03 PM   Specimen: Nasopharyngeal Swab; Nasopharyngeal(NP) swabs in vial transport medium  Result Value Ref Range Status   SARS Coronavirus 2 by RT PCR NEGATIVE NEGATIVE Final    Comment: (NOTE) SARS-CoV-2 target nucleic acids are NOT DETECTED.  The SARS-CoV-2 RNA is generally  detectable in upper respiratory specimens during the acute phase of infection. The lowest concentration of SARS-CoV-2 viral copies this assay can detect is 138 copies/mL. A negative result does not preclude SARS-Cov-2 infection and should not be used as the sole basis for treatment or other patient management decisions. A negative result may occur with  improper specimen collection/handling, submission of specimen other than nasopharyngeal swab, presence of viral mutation(s) within the areas targeted by this assay, and inadequate number of viral copies(<138 copies/mL). A negative result must be combined with clinical observations, patient history, and epidemiological information. The expected result is Negative.  Fact Sheet for Patients:  EntrepreneurPulse.com.au  Fact Sheet for Healthcare Providers:  IncredibleEmployment.be  This test is no t  yet approved or cleared by the [place] and  has been authorized for detection and/or diagnosis of SARS-CoV-2 by FDA under an Emergency Use Authorization (EUA). This EUA will remain  in effect (meaning this test can be used) for the duration of the COVID-19 declaration under Section 564(b)(1) of the Act, 21 U.S.C.section 360bbb-3(b)(1), unless the authorization is terminated  or revoked sooner.       Influenza A by PCR NEGATIVE NEGATIVE Final   Influenza B by PCR NEGATIVE NEGATIVE Final    Comment: (NOTE) The Xpert Xpress SARS-CoV-2/FLU/RSV plus assay is intended as an aid in the diagnosis of influenza from Nasopharyngeal swab specimens and should not be used as a sole basis for treatment. Nasal washings and aspirates are unacceptable for Xpert Xpress SARS-CoV-2/FLU/RSV testing.  Fact Sheet for Patients: EntrepreneurPulse.com.au  Fact Sheet for Healthcare Providers: IncredibleEmployment.be  This test is not yet approved or cleared by the [place] FDA  and has been authorized for detection and/or diagnosis of SARS-CoV-2 by FDA under an Emergency Use Authorization (EUA). This EUA will remain in effect (meaning this test can be used) for the duration of the COVID-19 declaration under Section 564(b)(1) of the Act, 21 U.S.C. section 360bbb-3(b)(1), unless the authorization is terminated or revoked.  Performed at [facility], [address] [place], [place] [address] [address]   Gastrointestinal Panel by PCR , Stool     Status: None   Collection Time: 06/29/20  9:20 PM   Specimen: Stool  Result Value Ref Range Status   Campylobacter species NOT DETECTED NOT DETECTED Final   Plesimonas shigelloides NOT DETECTED NOT DETECTED Final   Salmonella species NOT DETECTED NOT DETECTED Final   Yersinia enterocolitica NOT DETECTED NOT DETECTED Final   Vibrio species NOT DETECTED NOT DETECTED Final   Vibrio cholerae NOT DETECTED NOT DETECTED Final   Enteroaggregative E coli (EAEC) NOT DETECTED NOT DETECTED Final   Enteropathogenic E coli (EPEC) NOT DETECTED NOT DETECTED Final   Enterotoxigenic E coli (ETEC) NOT DETECTED NOT DETECTED Final   Shiga like toxin producing E coli (STEC) NOT DETECTED NOT DETECTED Final   Shigella/Enteroinvasive E coli (EIEC) NOT DETECTED NOT DETECTED Final   Cryptosporidium NOT DETECTED NOT DETECTED Final   Cyclospora cayetanensis NOT DETECTED NOT DETECTED Final   Entamoeba histolytica NOT DETECTED NOT DETECTED Final   Giardia lamblia NOT DETECTED NOT DETECTED Final   Adenovirus F40/41 NOT DETECTED NOT DETECTED Final   Astrovirus NOT DETECTED NOT DETECTED Final   Norovirus GI/GII NOT DETECTED NOT DETECTED Final   Rotavirus A NOT DETECTED NOT DETECTED Final   Sapovirus (I, II, IV, and V) NOT DETECTED NOT DETECTED Final    Comment: Performed at [facility], [address] [place], [place] [address]  C Difficile Quick Screen w PCR reflex     Status: None   Collection Time: 06/29/20  9:20 PM   Specimen:  STOOL  Result Value Ref Range Status   C Diff antigen NEGATIVE NEGATIVE Final   C Diff toxin NEGATIVE NEGATIVE Final   C Diff interpretation No C. difficile detected.  Final    Comment: Performed at [facility], [address] [place], [place] [address]     Radiology Studies: CT Head Wo Contrast  Result Date: 06/29/2020 CLINICAL DATA:  Dizziness. EXAM: CT HEAD WITHOUT CONTRAST TECHNIQUE: Contiguous axial images were obtained from the base of the skull through the vertex without intravenous contrast. COMPARISON:  [facility] 12/15/2013. FINDINGS: Brain: No evidence of acute  infarction, hemorrhage, hydrocephalus, extra-axial collection or mass lesion/mass effect. Mild to moderate patchy white matter hypoattenuation, nonspecific but most likely related to chronic microvascular ischemic disease. Vascular: No hyperdense vessel identified Skull: No acute fracture. No substantial change in a partially calcified right parietal scalp lesion, likely benign given long-term stability. Sinuses/Orbits: Mild ethmoid air cell mucosal thickening. Otherwise, visualized sinuses are clear. Unremarkable orbits. Other: No mastoid effusions. IMPRESSION: 1. No evidence of acute intracranial abnormality. 2. Mild to moderate chronic microvascular ischemic disease, progressed from prior. Electronically Signed   By: Margaretha Sheffield MD   On: 06/29/2020 16:51   CT ABDOMEN PELVIS W CONTRAST  Result Date: 06/29/2020 CLINICAL DATA:  55 year old male with history of abdominal pain and dizziness. EXAM: CT ABDOMEN AND PELVIS WITH CONTRAST TECHNIQUE: Multidetector CT imaging of the abdomen and pelvis was performed using the standard protocol following bolus administration of intravenous contrast. CONTRAST:  152mL OMNIPAQUE IOHEXOL 300 MG/ML  SOLN COMPARISON:  CT the abdomen and pelvis 10/13/2019. FINDINGS: Lower chest: Linear scarring in the lower lobes of the lungs bilaterally. Pacemaker leads in the right atrium, right  ventricle and overlying the lateral wall the left ventricle via the coronary sinus and coronary veins. Hepatobiliary: Diffuse low attenuation throughout the visualized hepatic parenchyma, indicative of hepatic steatosis. No suspicious cystic or solid hepatic lesions. No intra or extrahepatic biliary ductal dilatation. Several small calcified gallstones are noted within the lumen of the gallbladder which is completely decompressed around the gallstones. No surrounding inflammatory changes. Pancreas: No pancreatic mass. No pancreatic ductal dilatation. No pancreatic or peripancreatic fluid collections or inflammatory changes. Spleen: Unremarkable. Adrenals/Urinary Tract: 4 mm nonobstructive calculus in the lower pole collecting system of left kidney. Multiple low-attenuation lesions in both kidneys, compatible with simple cysts, largest of which is exophytic in the upper pole of the left kidney measuring 4.2 cm in diameter. Bilateral adrenal glands are normal in appearance. No hydroureteronephrosis. Urinary bladder is normal in appearance. Stomach/Bowel: Normal appearance of the stomach. Multiple prominent borderline dilated and mildly dilated loops of small bowel most evident throughout the mid small bowel, measuring up to 4.4 cm in diameter. Within the dilated loops of small bowel, there are areas of mural thickening and mucosal hyperenhancement best appreciated on axial image 58 of series 3. Several additional areas of mural thickening and mucosal hyperenhancement are also noted elsewhere throughout the small bowel and colon, including the distal and terminal ileum which is completely decompressed, hepatic flexure of the colon, several regions in the transverse colon, and distal sigmoid colon and rectum. Normal appendix. Vascular/Lymphatic: No significant atherosclerotic disease, aneurysm or dissection noted in the abdominal or pelvic vasculature. Retroaortic left renal vein (normal anatomical variant) incidentally  noted. No lymphadenopathy noted in the abdomen or pelvis. Reproductive: Prostate gland and seminal vesicles are unremarkable in appearance. Other: No significant volume of ascites.  No pneumoperitoneum. Musculoskeletal: There are no aggressive appearing lytic or blastic lesions noted in the visualized portions of the skeleton. IMPRESSION: 1. Overall, the appearance of the small bowel and colon is concerning for inflammatory bowel disease such as Crohn's disease, as detailed above. Further clinical evaluation is recommended. 2. Hepatic steatosis. 3. 4 mm nonobstructive calculus in the lower pole collecting system of left kidney. 4. Cholelithiasis without evidence of acute cholecystitis at this time. 5. Additional incidental findings, as above. Electronically Signed   By: Vinnie Langton M.D.   On: 06/29/2020 17:02   US ABDOMEN LIMITED RUQ (LIVER/GB)  Result Date: 06/29/2020 CLINICAL DATA:  Right upper  quadrant pain for 3 days. EXAM: ULTRASOUND ABDOMEN LIMITED RIGHT UPPER QUADRANT COMPARISON:  CT 06/29/2020 FINDINGS: Gallbladder: Contracted gallbladder with multiple stones. No wall thickening or edema. Murphy's sign is negative. Common bile duct: Diameter: 1.5 mm, normal Liver: Diffusely increased echotexture suggesting fatty infiltration. No focal lesions are identified. Portal vein is patent on color Doppler imaging with normal direction of blood flow towards the liver. Other: None. IMPRESSION: Cholelithiasis without additional changes to suggest acute cholecystitis. Probable diffuse fatty infiltration of the liver. Electronically Signed   By: Lucienne Capers M.D.   On: 06/29/2020 19:09    Marzetta Board, MD, PhD [facility]  Between 7 am - 7 pm I am available, please contact me via Amion (for emergencies) or Securechat (non urgent messages)  Between 7 pm - 7 am I am not available, please contact night coverage MD/APP via Amion

## 2020-06-30 NOTE — Consult Note (Signed)
Consultation  Referring Provider:     Dr Marcello Moores Admit date 06/29/20 Consult date        06/30/20 Reason for Consultation:     colitis         HPI:   James [name] is a 55 y.o. male medical history significant of   myotonic dystrophy type I, cardiomyopathy status post AICD, sleep apnea, GERD and aspiration pneumonia,prior hx of partial bowel obstriction vs ileus 7/21, who was admitted yesterday for abdominal pain, NVD x 3d.  Labs: t bili mildly elevated,otherwise cmp unremarkable- K+ low this am but to be replaced.. Crp elevated.  stool pcr/cdiff unremarkable, cbc with minimal leukocytosis (10.9), lactate normal. Fecal calprotectin pending  CT a/p 06/29/20:  IMPRESSION: 1. Overall, the appearance of the small bowel and colon is concerning for inflammatory bowel disease such as Crohn's disease, as detailed above. Further clinical evaluation is recommended. 2. Hepatic steatosis. 3. 4 mm nonobstructive calculus in the lower pole collecting system of left kidney. 4. Cholelithiasis without evidence of acute cholecystitis at this time. 5. Additional incidental findings, as above.  CT a/p review with some concerns of small bowel and mesenteric changes for inflammation/enteritis nonspecific nodularity of the omentum  PREVIOUS ENDOSCOPIES:            Colonoscopy 04/04/17- Dr Vicente Males- 3 polyps- 2 of which were adenomatous, removed from sigmoid colon, one was colonic tissue with hyperplasia, diverticulosis Colonoscopy 08/16/16- Dr Anna/+ cologuard- large 35mm sigmoid polyp (serrated and tubular adenoma), removed & marked with [place] ink; a few other small polyps- some adenomas, 2 hyperplastic  Patient and wife are present for interview- they report patient has had issues with irregular bowel habits- alternates constipation/diarrhea- but no rectal bleeding- there is some recurrent abdominal pain that occurs once or twice over a 2 week period and this is associated with attempting to defecate and/or abdominal  pain. Endorses bloating/gassiness at times. There is some dysphagia associated with MD that has worsened over the last year- has been evaluated by speech in past. No melena or other GI concerns. Prior to this episode- he became dizzy 3-4w ago then began to have nausea 2w ago- lost appetite and then started vomiting/diarrhea 4d ago- no new meds/travel/sick exposures. States that last July symptoms were similar upon presentation to hospital but not so severe. No family history of IBD/colorectal cancer/celiac/colon polyps   Past Medical History:  Diagnosis Date  . Bronchitis   . Cardiomyopathy ([place])   . Dysphagia   . GERD (gastroesophageal reflux disease)   . History of chicken pox   . History of pneumonia 2013   double PNA at [facility]  . LBBB (left bundle branch block)   . Myotonic dystrophy, type 1 ([place])    followed by [facility]  . Pneumonia    aspiration  . Sleep apnea     Past Surgical History:  Procedure Laterality Date  . CARDIAC DEFIBRILLATOR PLACEMENT     defib/pacer  . CARDIAC DEFIBRILLATOR PLACEMENT    . COLONOSCOPY WITH PROPOFOL N/A 08/16/2016   Procedure: COLONOSCOPY WITH PROPOFOL;  Surgeon: Jonathon Bellows, MD;  Location: [facility] ENDOSCOPY;  Service: Gastroenterology;  Laterality: N/A;  . COLONOSCOPY WITH PROPOFOL N/A 04/04/2017   Procedure: COLONOSCOPY WITH PROPOFOL;  Surgeon: Jonathon Bellows, MD;  Location: [facility] ENDOSCOPY;  Service: Gastroenterology;  Laterality: N/A;  . PACEMAKER IMPLANT    . US ECHOCARDIOGRAPHY  09/2010   EF 50%, mld LVH and dysfunction, mild RV dysfunction and enlarged    Family History  Problem Relation  Age of Onset  . Hypertension Father   . Cancer Paternal Grandfather        lung, smoker  . Cancer Paternal Uncle        bone  . Diabetes Mother   . CAD Mother 42       MI  . Stroke Neg Hx      Social History   Tobacco Use  . Smoking status: Never Smoker  . Smokeless tobacco: Never Used  Vaping Use  . Vaping Use: Never used  Substance Use Topics  .  Alcohol use: No  . Drug use: No    Prior to Admission medications   Medication Sig Start Date End Date Taking? Authorizing Provider  albuterol (PROVENTIL HFA;VENTOLIN HFA) 108 (90 Base) MCG/ACT inhaler Inhale 2 puffs into the lungs every 6 (six) hours as needed for wheezing or shortness of breath. 03/27/17   Laban Emperor, PA-C  albuterol (PROVENTIL) (2.5 MG/3ML) 0.083% nebulizer solution Take 3 mLs (2.5 mg total) by nebulization every 6 (six) hours as needed for wheezing or shortness of breath. 03/31/17   Leone Haven, MD  lisinopril (PRINIVIL,ZESTRIL) 5 MG tablet Take 2.5 mg by mouth 2 (two) times daily.     [provider]  loratadine (CLARITIN) 10 MG tablet Take 1 tablet (10 mg total) by mouth daily. 04/13/15   Leone Haven, MD  Multiple Vitamin (MULTIVITAMIN) tablet Take 1 tablet by mouth daily.    [provider]  polyethylene glycol (MIRALAX MIX-IN PAX) 17 g packet Take 17 g by mouth daily as needed for mild constipation. 10/16/19   Mariel Aloe, MD    Current Facility-Administered Medications  Medication Dose Route Frequency Provider Last Rate Last Admin  . 0.9 %  sodium chloride infusion   Intravenous Continuous Clance Boll, MD   Stopped at 06/30/20 0119  . acetaminophen (TYLENOL) tablet 650 mg  650 mg Oral Q6H PRN Clance Boll, MD       Or  . acetaminophen (TYLENOL) suppository 650 mg  650 mg Rectal Q6H PRN Myles Rosenthal A, MD      . albuterol (PROVENTIL) (2.5 MG/3ML) 0.083% nebulizer solution 2.5 mg  2.5 mg Nebulization Q2H PRN Myles Rosenthal A, MD      . albuterol (VENTOLIN HFA) 108 (90 Base) MCG/ACT inhaler 2 puff  2 puff Inhalation Q6H PRN Myles Rosenthal A, MD      . heparin injection 5,000 Units  5,000 Units Subcutaneous Q8H Clance Boll, MD   5,000 Units at 06/30/20 0603  . lisinopril (ZESTRIL) tablet 2.5 mg  2.5 mg Oral BID Myles Rosenthal A, MD   2.5 mg at 06/29/20 2340  . loratadine (CLARITIN) tablet 10 mg  10  mg Oral Daily Myles Rosenthal A, MD      . ondansetron [facility]) tablet 4 mg  4 mg Oral Q6H PRN Clance Boll, MD       Or  . ondansetron [facility]) injection 4 mg  4 mg Intravenous Q6H PRN Myles Rosenthal A, MD      . potassium chloride 10 mEq in 100 mL IVPB  10 mEq Intravenous Q1 Hr x 3 Judd Gaudier V, MD 100 mL/hr at 06/30/20 0659 10 mEq at 06/30/20 0659    Allergies as of 06/29/2020 - Review Complete 06/29/2020  Allergen Reaction Noted  . Codeine Nausea Only 05/31/2013     Review of Systems:    All systems reviewed and negative except where noted in HPI, with the exception  of some weakness compatible with his MD    Physical Exam:  Vital signs in last 24 hours: Temp:  [97.9 F (36.6 C)-98.6 F (37 C)] 98.1 F (36.7 C) (04/05 0810) Pulse Rate:  [71-99] 71 (04/05 0810) Resp:  [14-29] 20 (04/05 0810) BP: (99-137)/(62-88) 103/62 (04/05 0810) SpO2:  [91 %-97 %] 92 % (04/05 0810) Weight:  [104.3 kg] 104.3 kg (04/04 1504) Last BM Date: 06/30/20 General:   Pleasant man in NAD Head:  Normocephalic and atraumatic. Eyes:   No icterus.   Conjunctiva pink. Ears:  Normal auditory acuity. Mouth: Mucosa pink moist, no lesions. Neck:  Supple; no masses felt Lungs:  Respirations even and unlabored. Lungs clear to auscultation bilaterally.   No wheezes, crackles, or rhonchi.  Heart:  S1S2, RRR, no MRG. No edema. Abdomen:   proteuberant, soft, nondistended, nontender. Normal bowel sounds. No appreciable masses or hepatomegaly. No rebound signs or other peritoneal signs. Rectal:  Not performed.  Msk:  MAEW x4, No clubbing or cyanosis. Strength 5/5. Symmetrical without gross deformities. Neurologic:  Alert and  oriented x4;  Cranial nerves II-XII intact.  Skin:  Warm, dry, pink without significant lesions or rashes. Psych:  Alert and cooperative. Normal affect.  LAB RESULTS: Recent Labs    06/29/20 1505 06/29/20 2324 06/30/20 0526  WBC 10.9* 8.6 8.8  HGB 16.3 15.5 15.1  HCT  48.1 46.5 45.2  PLT 263 230 221   BMET Recent Labs    06/29/20 1505 06/29/20 2324 06/30/20 0526  NA 139  --  140  K 3.5  --  2.7*  CL 104  --  104  CO2 24  --  26  GLUCOSE 156*  --  96  BUN 9  --  9  CREATININE 0.66 0.70 0.77  CALCIUM 8.9  --  8.6*   LFT Recent Labs    06/30/20 0526  PROT 6.5  ALBUMIN 3.4*  AST 18  ALT 23  ALKPHOS 60  BILITOT 1.7*   PT/INR No results for input(s): LABPROT, INR in the last 72 hours.  STUDIES: CT Head Wo Contrast  Result Date: 06/29/2020 CLINICAL DATA:  Dizziness. EXAM: CT HEAD WITHOUT CONTRAST TECHNIQUE: Contiguous axial images were obtained from the base of the skull through the vertex without intravenous contrast. COMPARISON:  [facility]bra 12/15/2013. FINDINGS: Brain: No evidence of acute infarction, hemorrhage, hydrocephalus, extra-axial collection or mass lesion/mass effect. Mild to moderate patchy white matter hypoattenuation, nonspecific but most likely related to chronic microvascular ischemic disease. Vascular: No hyperdense vessel identified Skull: No acute fracture. No substantial change in a partially calcified right parietal scalp lesion, likely benign given long-term stability. Sinuses/Orbits: Mild ethmoid air cell mucosal thickening. Otherwise, visualized sinuses are clear. Unremarkable orbits. Other: No mastoid effusions. IMPRESSION: 1. No evidence of acute intracranial abnormality. 2. Mild to moderate chronic microvascular ischemic disease, progressed from prior. Electronically Signed   By: Margaretha Sheffield MD   On: 06/29/2020 16:51   CT ABDOMEN PELVIS W CONTRAST  Result Date: 06/29/2020 CLINICAL DATA:  55 year old male with history of abdominal pain and dizziness. EXAM: CT ABDOMEN AND PELVIS WITH CONTRAST TECHNIQUE: Multidetector CT imaging of the abdomen and pelvis was performed using the standard protocol following bolus administration of intravenous contrast. CONTRAST:  13mL OMNIPAQUE IOHEXOL 300 MG/ML  SOLN COMPARISON:  CT the  abdomen and pelvis 10/13/2019. FINDINGS: Lower chest: Linear scarring in the lower lobes of the lungs bilaterally. Pacemaker leads in the right atrium, right ventricle and overlying the lateral wall the left  ventricle via the coronary sinus and coronary veins. Hepatobiliary: Diffuse low attenuation throughout the visualized hepatic parenchyma, indicative of hepatic steatosis. No suspicious cystic or solid hepatic lesions. No intra or extrahepatic biliary ductal dilatation. Several small calcified gallstones are noted within the lumen of the gallbladder which is completely decompressed around the gallstones. No surrounding inflammatory changes. Pancreas: No pancreatic mass. No pancreatic ductal dilatation. No pancreatic or peripancreatic fluid collections or inflammatory changes. Spleen: Unremarkable. Adrenals/Urinary Tract: 4 mm nonobstructive calculus in the lower pole collecting system of left kidney. Multiple low-attenuation lesions in both kidneys, compatible with simple cysts, largest of which is exophytic in the upper pole of the left kidney measuring 4.2 cm in diameter. Bilateral adrenal glands are normal in appearance. No hydroureteronephrosis. Urinary bladder is normal in appearance. Stomach/Bowel: Normal appearance of the stomach. Multiple prominent borderline dilated and mildly dilated loops of small bowel most evident throughout the mid small bowel, measuring up to 4.4 cm in diameter. Within the dilated loops of small bowel, there are areas of mural thickening and mucosal hyperenhancement best appreciated on axial image 58 of series 3. Several additional areas of mural thickening and mucosal hyperenhancement are also noted elsewhere throughout the small bowel and colon, including the distal and terminal ileum which is completely decompressed, hepatic flexure of the colon, several regions in the transverse colon, and distal sigmoid colon and rectum. Normal appendix. Vascular/Lymphatic: No significant  atherosclerotic disease, aneurysm or dissection noted in the abdominal or pelvic vasculature. Retroaortic left renal vein (normal anatomical variant) incidentally noted. No lymphadenopathy noted in the abdomen or pelvis. Reproductive: Prostate gland and seminal vesicles are unremarkable in appearance. Other: No significant volume of ascites.  No pneumoperitoneum. Musculoskeletal: There are no aggressive appearing lytic or blastic lesions noted in the visualized portions of the skeleton. IMPRESSION: 1. Overall, the appearance of the small bowel and colon is concerning for inflammatory bowel disease such as Crohn's disease, as detailed above. Further clinical evaluation is recommended. 2. Hepatic steatosis. 3. 4 mm nonobstructive calculus in the lower pole collecting system of left kidney. 4. Cholelithiasis without evidence of acute cholecystitis at this time. 5. Additional incidental findings, as above. Electronically Signed   By: Vinnie Langton M.D.   On: 06/29/2020 17:02   US ABDOMEN LIMITED RUQ (LIVER/GB)  Result Date: 06/29/2020 CLINICAL DATA:  Right upper quadrant pain for 3 days. EXAM: ULTRASOUND ABDOMEN LIMITED RIGHT UPPER QUADRANT COMPARISON:  CT 06/29/2020 FINDINGS: Gallbladder: Contracted gallbladder with multiple stones. No wall thickening or edema. Murphy's sign is negative. Common bile duct: Diameter: 1.5 mm, normal Liver: Diffusely increased echotexture suggesting fatty infiltration. No focal lesions are identified. Portal vein is patent on color Doppler imaging with normal direction of blood flow towards the liver. Other: None. IMPRESSION: Cholelithiasis without additional changes to suggest acute cholecystitis. Probable diffuse fatty infiltration of the liver. Electronically Signed   By: Lucienne Capers M.D.   On: 06/29/2020 19:09       Impression / Plan:   1. Abdominal pain. NVD- recurrent with history of irregular bowel habits and adenomatous colon polyps. There is also some dysphagia.   Given the concern for small bowel disease, will add celiac panel. Do believe he would benefit from endoscopic evaluation- recommend egd and colonoscopy - reviewed with patient and wife and they are agreeable.  Thank you very much for this consult. These services were provided by Stephens November, NP-C, in collaboration with Andrey Farmer, MD, with whom I have discussed this patient in full.  Stephens November, NP-C

## 2020-06-30 NOTE — Progress Notes (Signed)
Lab called with a critical potassium of 2.7. Notified Dr. Damita Dunnings on call. New orders were placed. Will continue to monitor.

## 2020-06-30 NOTE — Progress Notes (Signed)
PT Cancellation Note  Patient Details Name: James [name] MRN: [id number] DOB: 1965-12-22   Cancelled Treatment:    Reason Eval/Treat Not Completed: Medical issues which prohibited therapy Chart reviewed, pt with critically low K+ (2.7) levels and hypotensive (89/71).  Pt outside of ranges for PT, will continue to follow and hope for pt to be appropriate for eval tomorrow.   [name], DPT 06/30/2020, 1:10 PM

## 2020-06-30 NOTE — Progress Notes (Signed)
   06/30/20 0845  Clinical Encounter Type  Visited With Patient and family together  Visit Type Initial;Spiritual support;Social support  Referral From Nurse  Consult/Referral To Chaplain   Chaplain completed an AD education as requested. After PT has completed filling it out, Chaplain will return to have it notarized.

## 2020-06-30 NOTE — Progress Notes (Signed)
Aware of blood pressure reading of 87/65 HR of 67 & MD paged for further orders.

## 2020-06-30 NOTE — Evaluation (Signed)
Physical Therapy Evaluation Patient Details Name: James [name] MRN: [id number] DOB: March 07, 1966 Today's Date: 06/30/2020   History of Present Illness  Renn Dirocco is a 63yoM who [facility] on 4[facility] after 2-3d N/V/D. He also reports increasing weakness in the last several weeks and several falls. PMH: Myotonic dystrophy type 1, cardiomyopathy, dysphagia, GERD, PNA, OSA. Pt hgas baseline hypotension, SBP often in 100s,, but has been lower over the last 2-3 weeks.  Clinical Impression  Pt admitted with above diagnosis. Pt currently with functional limitations due to the deficits listed below (see "PT Problem List"). Patient agreeable to PT evaluation. Pt has some hypotension today, however is hypotensive at baseline and has been hypotensive for ~2 weeks- he has no symptoms of hypotension with sitting EOB or standing. Patient/wife provide detailed description of PLOF and home environment. Pt has baseline impairment and limitations imposed by chronic degenerative diagnosis of MD, but has extensive DME and appropriate strategies for ADL at home maximizing independence and safety. Pt reports to be at baseline this date given limited exam however, he declines AMB at this time. Patient's assessment this date reveals the patient requires an additional person or DME present for safety and/or physical assistance to complete their typical ADL, however appears to be at his functional baseline. Patient will benefit from skilled PT intervention to maximize independence and safety in mobility required for basic ADL performance at discharge.      Follow Up Recommendations Supervision for mobility/OOB;Supervision - Intermittent (as of now, no PT FU recommended but will reassess after colonoscopy)    Equipment Recommendations  None recommended by PT    Recommendations for Other Services       Precautions / Restrictions Precautions Precautions: Fall Restrictions Weight Bearing Restrictions: No      Mobility   Bed Mobility Overal bed mobility: Modified Independent             General bed mobility comments: supine to Left EOB (as at home); no dizziness at EOB despite hypotension today.    Transfers Overall transfer level: Needs assistance Equipment used: Rolling walker (2 wheeled) Transfers: Sit to/from Stand Sit to Stand: Supervision         General transfer comment: stands for ~60seconds,no dizziness, no obvious weakness, unsteadiness, posture tall and upright. (Pt reports to be at his baseline strength, denies any frank weakness.)  Ambulation/Gait Ambulation/Gait assistance:  (pt declines at this time.)              Stairs            Wheelchair Mobility    Modified Rankin (Stroke Patients Only)       Balance Overall balance assessment: History of Falls;Mild deficits observed, not formally tested;Modified Independent                                           Pertinent Vitals/Pain Pain Assessment: No/denies pain    Home Living Family/patient expects to be discharged to:: Private residence Living Arrangements: Spouse/significant other;Children (Adult son lives in home, workfs from home, does nto typically assist with anything.) Available Help at Discharge: Family Type of Home: House Home Access: Ramped entrance     Home Layout: One level Home Equipment: Environmental consultant - 2 wheels;Cane - single point;Shower seat;Grab bars - toilet;Grab bars - tub/shower;Hand held shower head;Wheelchair - power;Electric scooter;Bedside commode Additional Comments: Engineer, maintenance wheelchair, the latter  mostly for out of house; bars on toilet seat    Prior Function Level of Independence: Needs assistance   Gait / Transfers Assistance Needed: short household distances only are tolerated.  ADL's / Homemaking Assistance Needed: AMB self to BR during the day; total assist for bathing; can self feed independently with set up,  Comments: Pt is able to get  around in the home, toilet, etc but is not out of the house a lot; sleeps in bed at home c elevated HOB; in past 3 months maybe 1 unsur ehow long ago.     Hand Dominance        Extremity/Trunk Assessment   Upper Extremity Assessment Upper Extremity Assessment: Generalized weakness;Overall [facility] for tasks assessed    Lower Extremity Assessment Lower Extremity Assessment: Generalized weakness;Overall [facility] for tasks assessed    Cervical / Trunk Assessment Cervical / Trunk Assessment: Normal  Communication      Cognition Arousal/Alertness: Awake/alert Behavior During Therapy: Flat affect Overall Cognitive Status: Within Functional Limits for tasks assessed                                        General Comments      Exercises     Assessment/Plan    PT Assessment Patient needs continued PT services  PT Problem List Decreased strength;Decreased range of motion;Decreased activity tolerance;Decreased balance;Decreased mobility       PT Treatment Interventions Functional mobility training;Therapeutic activities;Therapeutic exercise;Balance training;Patient/family education    PT Goals (Current goals can be found in the Care Plan section)  Acute Rehab PT Goals Patient Stated Goal: return to home and have less bowel irritability PT Goal Formulation: With patient Time For Goal Achievement: 07/14/20 Potential to Achieve Goals: Good    Frequency Min 2X/week   Barriers to discharge        Co-evaluation               AM-PAC PT "6 Clicks" Mobility  Outcome Measure Help needed turning from your back to your side while in a flat bed without using bedrails?: None Help needed moving from lying on your back to sitting on the side of a flat bed without using bedrails?: None Help needed moving to and from a bed to a chair (including a wheelchair)?: A Little Help needed standing up from a chair using your arms (e.g., wheelchair or bedside chair)?: A  Little Help needed to walk in hospital room?: A Little Help needed climbing 3-5 steps with a railing? : A Lot 6 Click Score: 19    End of Session   Activity Tolerance: Patient tolerated treatment well;No increased pain Patient left: in bed;with family/visitor present;with call bell/phone within reach (seated EOB)   PT Visit Diagnosis: Other abnormalities of gait and mobility (R26.89);Muscle weakness (generalized) (M62.81);History of falling (Z91.81)    Time: 9702-6378 PT Time Calculation (min) (ACUTE ONLY): 30 min   Charges:   PT Evaluation $PT Eval Moderate Complexity: 1 Mod         4:17 PM, 06/30/20 Etta Grandchild, PT, DPT Physical Therapist - [facility]  [phone] ([place])    Stiles C 06/30/2020, 4:11 PM

## 2020-06-30 NOTE — Consult Note (Signed)
Vonda Antigua, MD [address] [place], [place], [place], [address] [address], [place], [place], [place], [address] Phone: [phone]  Fax: [phone]  Consultation  Referring Provider:     Dr. 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  Patient had a similar episode last year in 2021 and was managed conservatively and improved and did not have any procedures done at that time.  Patient denies any fever chills.  Infectious work-up on admission was negative for any infectious etiology including C. difficile.  Patient denies any blood in stool.  Is tolerating clear liquids.  Did have another loose stool this morning.  Reports the pain being dull, 5 or 10, nonradiating.  However, wife also reports that patient intermittently, about once a week will have 1 or 2 loose stools in 1 day and then not have a bowel movement for 3 to 4 days.  Has had previous colonoscopies before in 2018 and 2019.  Polyps were removed during those procedures, but there was no evidence of IBD at that time.  Please see procedure report for details.  CT on this admission reports " the appearance of the small bowel and colon is concerning for inflammatory bowel disease".  Past Medical History:  Diagnosis Date  . Bronchitis   . Cardiomyopathy ([place])   . Dysphagia   . GERD (gastroesophageal reflux disease)   . History of chicken pox   . History of pneumonia 2013   double PNA at [facility]  . LBBB (left bundle branch block)   . Myotonic dystrophy, type 1 ([place])    followed by [facility]  . Pneumonia    aspiration  . Sleep apnea     Past Surgical History:  Procedure Laterality Date  . CARDIAC DEFIBRILLATOR PLACEMENT      defib/pacer  . CARDIAC DEFIBRILLATOR PLACEMENT    . COLONOSCOPY WITH PROPOFOL N/A 08/16/2016   Procedure: COLONOSCOPY WITH PROPOFOL;  Surgeon: Jonathon Bellows, MD;  Location: [facility] ENDOSCOPY;  Service: Gastroenterology;  Laterality: N/A;  . COLONOSCOPY WITH PROPOFOL N/A 04/04/2017   Procedure: COLONOSCOPY WITH PROPOFOL;  Surgeon: Jonathon Bellows, MD;  Location: [facility] ENDOSCOPY;  Service: Gastroenterology;  Laterality: N/A;  . PACEMAKER IMPLANT    . US ECHOCARDIOGRAPHY  09/2010   EF 50%, mld LVH and dysfunction, mild RV dysfunction and enlarged    Prior to Admission medications   Medication Sig Start Date End Date Taking? Authorizing Provider  acetaminophen (TYLENOL) 500 MG tablet Take 500-1,000 mg by mouth every 6 (six) hours as needed for moderate pain or headache.   Yes [provider]  albuterol (PROVENTIL) (2.5 MG/3ML) 0.083% nebulizer solution Take 3 mLs (2.5 mg total) by nebulization every 6 (six) hours as needed for wheezing or shortness of breath. 03/31/17  Yes Leone Haven, MD  docusate sodium (COLACE) 100 MG capsule Take 100 mg by mouth daily as needed for mild constipation.   Yes [provider]  lisinopril (PRINIVIL,ZESTRIL) 5 MG tablet Take 2.5 mg by mouth 2 (two) times daily.    Yes [provider]  Multiple Vitamin (MULTIVITAMIN) tablet Take 1 tablet by mouth daily.   Yes [provider]  Ondansetron HCl (ZOFRAN PO) Take 1 tablet by mouth every 8 (eight) hours as needed (nausea/vomiting).   Yes [provider]    Family History  Problem Relation Age of Onset  . Hypertension Father   . Cancer Paternal Grandfather        lung, smoker  . Cancer Paternal Uncle        bone  . Diabetes Mother   . CAD Mother 63       MI  . Stroke Neg Hx      Social History   Tobacco Use  . Smoking status: Never Smoker  . Smokeless tobacco: Never Used  Vaping Use  . Vaping Use: Never used  Substance Use Topics  . Alcohol use: No  . Drug use: No     Allergies as of 06/29/2020 - Review Complete 06/29/2020  Allergen Reaction Noted  . Codeine Nausea Only 05/31/2013    Review of Systems:    All systems reviewed and negative except where noted in HPI.   Physical Exam:  Vital signs in last 24 hours: Vitals:   06/30/20 0428 06/30/20 0807 06/30/20 0810 06/30/20 1215  BP: 111/72  103/62 (!) 89/71  Pulse: 75 74 71 71  Resp: 20  20 16   Temp: 98.3 F (36.8 C)  98.1 F (36.7 C) 98.1 F (36.7 C)  TempSrc: Oral  Oral   SpO2: 91%  92% 94%  Weight:      Height:       Last BM Date: 06/29/20 General:   Pleasant, cooperative in NAD Head:  Normocephalic and atraumatic. Eyes:   No icterus.   Conjunctiva pink. PERRLA. Ears:  Normal auditory acuity. Neck:  Supple; no masses or thyroidomegaly Lungs: Respirations even and unlabored. Lungs clear to auscultation bilaterally.   No wheezes, crackles, or rhonchi.  Abdomen:  Soft, nondistended, nontender. Normal bowel sounds. No appreciable masses or hepatomegaly.  No rebound or guarding.  Neurologic:  Alert and oriented x3;  grossly normal neurologically. Skin:  Intact without significant lesions or rashes. Cervical Nodes:  No significant cervical adenopathy. Psych:  Alert and cooperative. Normal affect.  LAB RESULTS: Recent Labs    06/29/20 1505 06/29/20 2324 06/30/20 0526  WBC 10.9* 8.6 8.8  HGB 16.3 15.5 15.1  HCT 48.1 46.5 45.2  PLT 263 230 221   BMET Recent Labs    06/29/20 1505 06/29/20 2324 06/30/20 0526  NA 139  --  140  K 3.5  --  2.7*  CL 104  --  104  CO2 24  --  26  GLUCOSE 156*  --  96  BUN 9  --  9  CREATININE 0.66 0.70 0.77  CALCIUM 8.9  --  8.6*   LFT Recent Labs    06/30/20 0526  PROT 6.5  ALBUMIN 3.4*  AST 18  ALT 23  ALKPHOS 60  BILITOT 1.7*   PT/INR No results for input(s): LABPROT, INR in the last 72 hours.  STUDIES: CT Head Wo Contrast  Result Date: 06/29/2020 CLINICAL DATA:  Dizziness. EXAM: CT HEAD WITHOUT CONTRAST TECHNIQUE:  Contiguous axial images were obtained from the base of the skull through the vertex without intravenous contrast. COMPARISON:  [place] 12/15/2013. FINDINGS: Brain: No evidence of acute infarction, hemorrhage, hydrocephalus, extra-axial collection or mass lesion/mass effect. Mild to moderate patchy white matter hypoattenuation, nonspecific but most likely related to chronic microvascular ischemic disease. Vascular: No hyperdense vessel identified Skull: No acute fracture. No substantial change in a partially calcified  right parietal scalp lesion, likely benign given long-term stability. Sinuses/Orbits: Mild ethmoid air cell mucosal thickening. Otherwise, visualized sinuses are clear. Unremarkable orbits. Other: No mastoid effusions. IMPRESSION: 1. No evidence of acute intracranial abnormality. 2. Mild to moderate chronic microvascular ischemic disease, progressed from prior. Electronically Signed   By: Margaretha Sheffield MD   On: 06/29/2020 16:51   CT ABDOMEN PELVIS W CONTRAST  Result Date: 06/29/2020 CLINICAL DATA:  55 year old male with history of abdominal pain and dizziness. EXAM: CT ABDOMEN AND PELVIS WITH CONTRAST TECHNIQUE: Multidetector CT imaging of the abdomen and pelvis was performed using the standard protocol following bolus administration of intravenous contrast. CONTRAST:  123mL OMNIPAQUE IOHEXOL 300 MG/ML  SOLN COMPARISON:  CT the abdomen and pelvis 10/13/2019. FINDINGS: Lower chest: Linear scarring in the lower lobes of the lungs bilaterally. 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 Hepatobiliary: Diffuse low attenuation throughout the visualized hepatic parenchyma, indicative of hepatic steatosis. No suspicious cystic or solid hepatic lesions. No intra or extrahepatic biliary ductal dilatation. Several small calcified gallstones are noted within the lumen of the gallbladder which is completely  decompressed around the gallstones. No surrounding inflammatory changes. Pancreas: No pancreatic mass. No pancreatic ductal dilatation. No pancreatic or peripancreatic fluid collections or inflammatory changes. Spleen: Unremarkable. Adrenals/Urinary Tract: 4 mm nonobstructive calculus in the lower pole collecting system of left kidney. Multiple low-attenuation lesions in both kidneys, compatible with simple cysts, largest of which is exophytic in the upper pole of the left kidney measuring 4.2 cm in diameter. Bilateral adrenal glands are normal in appearance. No hydroureteronephrosis. Urinary bladder is normal in appearance. Stomach/Bowel: Normal appearance of the stomach. Multiple prominent borderline dilated and mildly dilated loops of small bowel most evident throughout the mid small bowel, measuring up to 4.4 cm in diameter. Within the dilated loops of small bowel, there are areas of mural thickening and mucosal hyperenhancement best appreciated on axial image 58 of series 3. Several additional areas of mural thickening and mucosal hyperenhancement are also noted elsewhere throughout the small bowel and colon, including the distal and terminal ileum which is completely decompressed, hepatic flexure of the colon, several regions in the transverse colon, and distal sigmoid colon and rectum. Normal appendix. Vascular/Lymphatic: No significant atherosclerotic disease, aneurysm or dissection noted in the abdominal or pelvic vasculature. Retroaortic left renal vein (normal anatomical variant) incidentally noted. No lymphadenopathy noted in the abdomen or pelvis. Reproductive: Prostate gland and seminal vesicles are unremarkable in appearance. Other: No significant volume of ascites.  No pneumoperitoneum. Musculoskeletal: There are no aggressive appearing lytic or blastic lesions noted in the visualized portions of the skeleton. IMPRESSION: 1. Overall, the appearance of the small bowel and colon is concerning for  inflammatory bowel disease such as Crohn's disease, as detailed above. Further clinical evaluation is recommended. 2. Hepatic steatosis. 3. 4 mm nonobstructive calculus in the lower pole collecting system of left kidney. 4. Cholelithiasis without evidence of acute cholecystitis at this time. 5. Additional incidental findings, as above. Electronically Signed   By: Vinnie Langton M.D.   On: 06/29/2020 17:02   US ABDOMEN LIMITED RUQ (LIVER/GB)  Result Date: 06/29/2020 CLINICAL DATA:  Right upper quadrant pain for 3 days. EXAM: ULTRASOUND ABDOMEN LIMITED RIGHT UPPER QUADRANT COMPARISON:  CT 06/29/2020 FINDINGS: Gallbladder: Contracted gallbladder with multiple stones. No wall thickening or edema. Murphy's sign is negative. Common bile duct: Diameter: 1.5 mm, normal Liver: Diffusely increased  echotexture suggesting fatty infiltration. No focal lesions are identified. Portal vein is patent on color Doppler imaging with normal direction of blood flow towards the liver. Other: None. IMPRESSION: Cholelithiasis without additional changes to suggest acute cholecystitis. Probable diffuse fatty infiltration of the liver. Electronically Signed   By: Lucienne Capers M.D.   On: 06/29/2020 19:09      Impression / Plan:   TORIANO AIKEY is a 55 y.o. y/o male with abdominal pain, nausea vomiting, diarrhea with imaging concerning for inflammatory bowel disease  Given significant findings on CT scan, with no endoscopic evaluation in over 3 years, and infectious work-up being negative and elevated CRP, colonoscopy recommended at this time with possible terminal ileum intubation   Alternative options of conservative management were discussed in detail, including but not limited to medication management, foregoing endoscopic procedures at this time and others.    I have discussed alternative options, risks & benefits,  which include, but are not limited to, bleeding, infection, perforation,respiratory complication & drug  reaction.  The patient agrees with this plan & written consent will be obtained.    Continue medical optimization and replace potassium as necessary.  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  Ultrasound does not show any biliary duct obstruction.  Obtain fractionation.  Thank you for involving me in the care of this patient.      LOS: 0 days   Virgel Manifold, MD  06/30/2020, 12:16 PM

## 2020-07-01 ENCOUNTER — Inpatient Hospital Stay: Payer: Medicare HMO | Admitting: Anesthesiology

## 2020-07-01 ENCOUNTER — Encounter
Admission: EM | Disposition: A | Discharge status: Home / Self Care | Payer: Self-pay | Source: Home / Self Care | Attending: Internal Medicine

## 2020-07-01 DIAGNOSIS — R1011 Right upper quadrant pain: Secondary | ICD-10-CM | POA: Diagnosis not present

## 2020-07-01 DIAGNOSIS — R197 Diarrhea, unspecified: Secondary | ICD-10-CM

## 2020-07-01 DIAGNOSIS — K5140 Inflammatory polyps of colon without complications: Secondary | ICD-10-CM | POA: Diagnosis not present

## 2020-07-01 DIAGNOSIS — E876 Hypokalemia: Secondary | ICD-10-CM | POA: Diagnosis not present

## 2020-07-01 DIAGNOSIS — K529 Noninfective gastroenteritis and colitis, unspecified: Secondary | ICD-10-CM | POA: Diagnosis not present

## 2020-07-01 DIAGNOSIS — K635 Polyp of colon: Secondary | ICD-10-CM

## 2020-07-01 DIAGNOSIS — R112 Nausea with vomiting, unspecified: Secondary | ICD-10-CM | POA: Diagnosis not present

## 2020-07-01 HISTORY — PX: COLONOSCOPY: SHX5424

## 2020-07-01 LAB — COMPREHENSIVE METABOLIC PANEL
ALT: 22 U/L (ref 0–44)
AST: 22 U/L (ref 15–41)
Albumin: 3.2 g/dL — ABNORMAL LOW (ref 3.5–5.0)
Alkaline Phosphatase: 57 U/L (ref 38–126)
Anion gap: 7 (ref 5–15)
BUN: 5 mg/dL — ABNORMAL LOW (ref 6–20)
CO2: 24 mmol/L (ref 22–32)
Calcium: 8.4 mg/dL — ABNORMAL LOW (ref 8.9–10.3)
Chloride: 109 mmol/L (ref 98–111)
Creatinine, Ser: 0.8 mg/dL (ref 0.61–1.24)
GFR, Estimated: >60 mL/min (ref >60)
Glucose, Bld: 90 mg/dL (ref 70–99)
Potassium: 3.1 mmol/L — ABNORMAL LOW (ref 3.5–5.1)
Sodium: 140 mmol/L (ref 135–145)
Total Bilirubin: 1.2 mg/dL (ref 0.3–1.2)
Total Protein: 6.1 g/dL — ABNORMAL LOW (ref 6.5–8.1)

## 2020-07-01 LAB — CBC
HCT: 41.6 % (ref 39.0–52.0)
Hemoglobin: 13.7 g/dL (ref 13.0–17.0)
MCH: 31.5 pg (ref 26.0–34.0)
MCHC: 32.9 g/dL (ref 30.0–36.0)
MCV: 95.6 fL (ref 80.0–100.0)
Platelets: 184 10*3/uL (ref 150–400)
RBC: 4.35 MIL/uL (ref 4.22–5.81)
RDW: 13.2 % (ref 11.5–15.5)
WBC: 5.5 10*3/uL (ref 4.0–10.5)
nRBC: 0 % (ref 0.0–0.2)

## 2020-07-01 LAB — BILIRUBIN, DIRECT: Bilirubin, Direct: 0.2 mg/dL (ref 0.0–0.2)

## 2020-07-01 LAB — MAGNESIUM: Magnesium: 2.1 mg/dL (ref 1.7–2.4)

## 2020-07-01 LAB — CALPROTECTIN, FECAL: Calprotectin, Fecal: 261 ug/g — ABNORMAL HIGH (ref 0–120)

## 2020-07-01 LAB — PROCALCITONIN: Procalcitonin: <0.1 ng/mL

## 2020-07-01 SURGERY — COLONOSCOPY
Anesthesia: General

## 2020-07-01 MED ORDER — POTASSIUM CHLORIDE 10 MEQ/100ML IV SOLN
10.0000 meq | INTRAVENOUS | Status: DC
  Filled 2020-07-01: qty 100

## 2020-07-01 MED ORDER — PROPOFOL 500 MG/50ML IV EMUL
INTRAVENOUS | Status: DC | PRN
  Administered 2020-07-01: 125 ug/kg/min via INTRAVENOUS

## 2020-07-01 MED ORDER — POTASSIUM CHLORIDE CRYS ER 20 MEQ PO TBCR
40.0000 meq | EXTENDED_RELEASE_TABLET | ORAL | Status: AC
  Administered 2020-07-01 (×2): 40 meq via ORAL
  Filled 2020-07-01 (×2): qty 2

## 2020-07-01 MED ORDER — PHENYLEPHRINE HCL (PRESSORS) 10 MG/ML IV SOLN
INTRAVENOUS | Status: DC | PRN
  Administered 2020-07-01: 100 ug via INTRAVENOUS

## 2020-07-01 MED ORDER — LIDOCAINE HCL (CARDIAC) PF 100 MG/5ML IV SOSY
PREFILLED_SYRINGE | INTRAVENOUS | Status: DC | PRN
  Administered 2020-07-01: 100 mg via INTRAVENOUS

## 2020-07-01 MED ORDER — EPHEDRINE SULFATE 50 MG/ML IJ SOLN
INTRAMUSCULAR | Status: DC | PRN
  Administered 2020-07-01: 5 mg via INTRAVENOUS

## 2020-07-01 NOTE — Op Note (Signed)
[facility] Gastroenterology Patient Name: James [name] Procedure Date: 07/01/2020 10:14 AM MRN: [id number] Account #: [id number] Date of Birth: 12/08/1965 Admit Type: Inpatient Age: 55 Room: [facility] ENDO ROOM 3 Gender: Male Note Status: Finalized Procedure:             Colonoscopy Indications:           Diarrhea, Abnormal CT of the GI tract Providers:             Basheer Molchan B. Bonna Gains MD, MD Referring MD:          Angela Adam. Caryl Bis (Referring MD) Medicines:             Monitored Anesthesia Care Complications:         No immediate complications. Procedure:             Pre-Anesthesia Assessment:                        - ASA Grade Assessment: II - A patient with mild                         systemic disease.                        - Prior to the procedure, a History and Physical was                         performed, and patient medications, allergies and                         sensitivities were reviewed. The patient's tolerance                         of previous anesthesia was reviewed.                        - The risks and benefits of the procedure and the                         sedation options and risks were discussed with the                         patient. All questions were answered and informed                         consent was obtained.                        - Patient identification and proposed procedure were                         verified prior to the procedure by the physician, the                         nurse, the anesthesiologist, the anesthetist and the                         technician. The procedure was verified in the  procedure room.                        After obtaining informed consent, the colonoscope was                         passed under direct vision. Throughout the procedure,                         the patient's blood pressure, pulse, and oxygen                         saturations were monitored  continuously. The                         Colonoscope was introduced through the anus and                         advanced to the the cecum, identified by appendiceal                         orifice and ileocecal valve. The colonoscopy was                         performed with ease. The patient tolerated the                         procedure well. The quality of the bowel preparation                         was fair. Findings:      The perianal and digital rectal examinations were normal.      Four sessile polyps were found in the sigmoid colon and descending       colon. The polyps were 5 to 6 mm in size. These polyps were removed with       a cold snare. Resection and retrieval were complete.      A 10 mm polyp was found in the descending colon. The polyp was flat. The       polyp was removed with a piecemeal technique using a cold snare.       Resection and retrieval were complete.      The exam was otherwise without abnormality.      The rectum, sigmoid colon, descending colon, transverse colon, ascending       colon and cecum appeared normal. Biopsies were taken with a cold forceps       for histology.      The retroflexed view of the distal rectum and anal verge was normal and       showed no anal or rectal abnormalities.      Terminal ileum could not be intubated despite using abdominal pressure,       position changes, scope withdrawal and reinsertion to prevent looping.       No obvious stricture was seen, but rather due to cecum being at a steep       angle, TI could not be examined or intubated. Impression:            - Preparation of the colon was fair.                        -  Four 5 to 6 mm polyps in the sigmoid colon and in                         the descending colon, removed with a cold snare.                         Resected and retrieved.                        - One 10 mm polyp in the descending colon, removed                         piecemeal using a cold snare.  Resected and retrieved.                        - The examination was otherwise normal.                        - The rectum, sigmoid colon, descending colon,                         transverse colon, ascending colon and cecum are                         normal. Biopsied.                        - The distal rectum and anal verge are normal on                         retroflexion view. Recommendation:        - Obtain CTE or MRE to evaluate the small bowel                        - Advance diet as tolerated.                        - Continue present medications.                        - Await pathology results.                        - Repeat colonoscopy date to be determined after                         pending pathology results are reviewed.                        - The findings and recommendations were discussed with                         the patient.                        - The findings and recommendations were discussed with                         the patient's family.                        -  Return to primary care physician as previously                         scheduled. Procedure Code(s):     --- Professional ---                        [phone], Colonoscopy, flexible; with removal of                         tumor(s), polyp(s), or other lesion(s) by snare                         technique                        [id number], 53, Colonoscopy, flexible; with biopsy, single                         or multiple Diagnosis Code(s):     --- Professional ---                        K63.5, Polyp of colon                        R19.7, Diarrhea, unspecified                        R93.3, Abnormal findings on diagnostic imaging of                         other parts of digestive tract CPT copyright 2019 [employer]. All rights reserved. The codes documented in this report are preliminary and upon coder review may  be revised to meet current compliance requirements.  Vonda Antigua, MD Margretta Sidle B. Bonna Gains MD, MD 07/01/2020 11:56:55 AM This report has been signed electronically. Number of Addenda: 0 Note Initiated On: 07/01/2020 10:14 AM Scope Withdrawal Time: 0 hours 29 minutes 6 seconds  Total Procedure Duration: 0 hours 42 minutes 3 seconds  Estimated Blood Loss:  Estimated blood loss: none.      [facility]

## 2020-07-01 NOTE — Anesthesia Preprocedure Evaluation (Signed)
Anesthesia Evaluation    Airway Mallampati: III  TM Distance: >3 FB     Dental  (+) Chipped   Pulmonary sleep apnea (BiPap) ,     + decreased breath sounds      Cardiovascular Exercise Tolerance: Poor (-) angina+CHF  + dysrhythmias (LBBB) + pacemaker + Cardiac Defibrillator  Rhythm:regular Rate:Normal     Neuro/Psych  Neuromuscular disease (myotonic dystrophy)    GI/Hepatic GERD  ,  Endo/Other    Renal/GU      Musculoskeletal   Abdominal   Peds  Hematology   Anesthesia Other Findings   Reproductive/Obstetrics                             Anesthesia Physical Anesthesia Plan  ASA: III  Anesthesia Plan:    Post-op Pain Management:    Induction:   PONV Risk Score and Plan: Propofol infusion  Airway Management Planned: Nasal CPAP  Additional Equipment:   Intra-op Plan:   Post-operative Plan:   Informed Consent:   Plan Discussed with:   Anesthesia Plan Comments:         Anesthesia Quick Evaluation

## 2020-07-01 NOTE — Progress Notes (Signed)
Vonda Antigua, MD [address] [place], [place], [place], [address] [address], [place], [place], [place], [address] Phone: [phone]  Fax: [phone]   Subjective: Patient completed his prep.  Potassium is improved from yesterday, and anesthesia is comfortable with proceeding with sedation and planned procedure today   Objective: Exam: Vital signs in last 24 hours: Vitals:   06/30/20 2044 06/30/20 2357 07/01/20 0512 07/01/20 0751  BP: 108/64 103/67 101/67 (!) 91/59  Pulse: 67 67 71 69  Resp: 16 16 18  (!) 22  Temp: 97.7 F (36.5 C) 99 F (37.2 C) 97.9 F (36.6 C) (!) 97.5 F (36.4 C)  TempSrc:   Oral Oral  SpO2: 100% 94% 96% 97%  Weight:      Height:       Weight change:   Intake/Output Summary (Last 24 hours) at 07/01/2020 1016 Last data filed at 07/01/2020 0034 Gross per 24 hour  Intake 1017.91 ml  Output --  Net 1017.91 ml    General: No acute distress, AAO x3 Abd: Soft, NT/ND, No HSM Skin: Warm, no rashes Neck: Supple, Trachea midline   Lab Results: Lab Results  Component Value Date   WBC 5.5 07/01/2020   HGB 13.7 07/01/2020   HCT 41.6 07/01/2020   MCV 95.6 07/01/2020   PLT 184 07/01/2020   Micro Results: Recent Results (from the past 240 hour(s))  Resp Panel by RT-PCR (Flu A&B, Covid) Nasopharyngeal Swab     Status: None   Collection Time: 06/29/20  4:03 PM   Specimen: Nasopharyngeal Swab; Nasopharyngeal(NP) swabs in vial transport medium  Result Value Ref Range Status   SARS Coronavirus 2 by RT PCR NEGATIVE NEGATIVE Final    Comment: (NOTE) SARS-CoV-2 target nucleic acids are NOT DETECTED.  The SARS-CoV-2 RNA is generally detectable in upper respiratory specimens during the acute phase of infection. The lowest concentration of SARS-CoV-2 viral copies this assay can detect is 138 copies/mL. A negative result does not preclude SARS-Cov-2 infection and should not be used as the sole basis for treatment or other patient management  decisions. A negative result may occur with  improper specimen collection/handling, submission of specimen other than nasopharyngeal swab, presence of viral mutation(s) within the areas targeted by this assay, and inadequate number of viral copies(<138 copies/mL). A negative result must be combined with clinical observations, patient history, and epidemiological information. The expected result is Negative.  Fact Sheet for Patients:  EntrepreneurPulse.com.au  Fact Sheet for Healthcare Providers:  IncredibleEmployment.be  This test is no t yet approved or cleared by the [place] FDA and  has been authorized for detection and/or diagnosis of SARS-CoV-2 by FDA under an Emergency Use Authorization (EUA). This EUA will remain  in effect (meaning this test can be used) for the duration of the COVID-19 declaration under Section 564(b)(1) of the Act, 21 U.S.C.section 360bbb-3(b)(1), unless the authorization is terminated  or revoked sooner.       Influenza A by PCR NEGATIVE NEGATIVE Final   Influenza B by PCR NEGATIVE NEGATIVE Final    Comment: (NOTE) The Xpert Xpress SARS-CoV-2/FLU/RSV plus assay is intended as an aid in the diagnosis of influenza from Nasopharyngeal swab specimens and should not be used as a sole basis for treatment. Nasal washings and aspirates are unacceptable for Xpert Xpress SARS-CoV-2/FLU/RSV testing.  Fact Sheet for Patients: EntrepreneurPulse.com.au  Fact Sheet for Healthcare Providers: IncredibleEmployment.be  This test is not yet approved or cleared by the [place] and has been authorized for  detection and/or diagnosis of SARS-CoV-2 by FDA under an Emergency Use Authorization (EUA). This EUA will remain in effect (meaning this test can be used) for the duration of the COVID-19 declaration under Section 564(b)(1) of the Act, 21 U.S.C. section 360bbb-3(b)(1), unless the  authorization is terminated or revoked.  Performed at [facility], [address] [place], [place] [address]   Gastrointestinal Panel by PCR , Stool     Status: None   Collection Time: 06/29/20  9:20 PM   Specimen: Stool  Result Value Ref Range Status   Campylobacter species NOT DETECTED NOT DETECTED Final   Plesimonas shigelloides NOT DETECTED NOT DETECTED Final   Salmonella species NOT DETECTED NOT DETECTED Final   Yersinia enterocolitica NOT DETECTED NOT DETECTED Final   Vibrio species NOT DETECTED NOT DETECTED Final   Vibrio cholerae NOT DETECTED NOT DETECTED Final   Enteroaggregative E coli (EAEC) NOT DETECTED NOT DETECTED Final   Enteropathogenic E coli (EPEC) NOT DETECTED NOT DETECTED Final   Enterotoxigenic E coli (ETEC) NOT DETECTED NOT DETECTED Final   Shiga like toxin producing E coli (STEC) NOT DETECTED NOT DETECTED Final   Shigella/Enteroinvasive E coli (EIEC) NOT DETECTED NOT DETECTED Final   Cryptosporidium NOT DETECTED NOT DETECTED Final   Cyclospora cayetanensis NOT DETECTED NOT DETECTED Final   Entamoeba histolytica NOT DETECTED NOT DETECTED Final   Giardia lamblia NOT DETECTED NOT DETECTED Final   Adenovirus F40/41 NOT DETECTED NOT DETECTED Final   Astrovirus NOT DETECTED NOT DETECTED Final   Norovirus GI/GII NOT DETECTED NOT DETECTED Final   Rotavirus A NOT DETECTED NOT DETECTED Final   Sapovirus (I, II, IV, and V) NOT DETECTED NOT DETECTED Final    Comment: Performed at [facility], [address] [place], [place] [address]  C Difficile Quick Screen w PCR reflex     Status: None   Collection Time: 06/29/20  9:20 PM   Specimen: STOOL  Result Value Ref Range Status   C Diff antigen NEGATIVE NEGATIVE Final   C Diff toxin NEGATIVE NEGATIVE Final   C Diff interpretation No C. difficile detected.  Final    Comment: Performed at [facility], [address] [place], [place] [address]   Studies/Results: CT Head Wo  Contrast  Result Date: 06/29/2020 CLINICAL DATA:  Dizziness. EXAM: CT HEAD WITHOUT CONTRAST TECHNIQUE: Contiguous axial images were obtained from the base of the skull through the vertex without intravenous contrast. COMPARISON:  [facility]tebra 12/15/2013. FINDINGS: Brain: No evidence of acute infarction, hemorrhage, hydrocephalus, extra-axial collection or mass lesion/mass effect. Mild to moderate patchy white matter hypoattenuation, nonspecific but most likely related to chronic microvascular ischemic disease. Vascular: No hyperdense vessel identified Skull: No acute fracture. No substantial change in a partially calcified right parietal scalp lesion, likely benign given long-term stability. Sinuses/Orbits: Mild ethmoid air cell mucosal thickening. Otherwise, visualized sinuses are clear. Unremarkable orbits. Other: No mastoid effusions. IMPRESSION: 1. No evidence of acute intracranial abnormality. 2. Mild to moderate chronic microvascular ischemic disease, progressed from prior. Electronically Signed   By: Margaretha Sheffield MD   On: 06/29/2020 16:51   CT ABDOMEN PELVIS W CONTRAST  Result Date: 06/29/2020 CLINICAL DATA:  55 year old male with history of abdominal pain and dizziness. EXAM: CT ABDOMEN AND PELVIS WITH CONTRAST TECHNIQUE: Multidetector CT imaging of the abdomen and pelvis was performed using the standard protocol following bolus administration of intravenous contrast. CONTRAST:  113mL OMNIPAQUE IOHEXOL 300 MG/ML  SOLN COMPARISON:  CT the abdomen and pelvis 10/13/2019.  FINDINGS: Lower chest: Linear scarring in the lower lobes of the lungs bilaterally. 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 Hepatobiliary: Diffuse low attenuation throughout the visualized hepatic parenchyma, indicative of hepatic steatosis. No suspicious cystic or solid hepatic lesions. No intra or extrahepatic biliary ductal  dilatation. Several small calcified gallstones are noted within the lumen of the gallbladder which is completely decompressed around the gallstones. No surrounding inflammatory changes. Pancreas: No pancreatic mass. No pancreatic ductal dilatation. No pancreatic or peripancreatic fluid collections or inflammatory changes. Spleen: Unremarkable. Adrenals/Urinary Tract: 4 mm nonobstructive calculus in the lower pole collecting system of left kidney. Multiple low-attenuation lesions in both kidneys, compatible with simple cysts, largest of which is exophytic in the upper pole of the left kidney measuring 4.2 cm in diameter. Bilateral adrenal glands are normal in appearance. No hydroureteronephrosis. Urinary bladder is normal in appearance. Stomach/Bowel: Normal appearance of the stomach. Multiple prominent borderline dilated and mildly dilated loops of small bowel most evident throughout the mid small bowel, measuring up to 4.4 cm in diameter. Within the dilated loops of small bowel, there are areas of mural thickening and mucosal hyperenhancement best appreciated on axial image 58 of series 3. Several additional areas of mural thickening and mucosal hyperenhancement are also noted elsewhere throughout the small bowel and colon, including the distal and terminal ileum which is completely decompressed, hepatic flexure of the colon, several regions in the transverse colon, and distal sigmoid colon and rectum. Normal appendix. Vascular/Lymphatic: No significant atherosclerotic disease, aneurysm or dissection noted in the abdominal or pelvic vasculature. Retroaortic left renal vein (normal anatomical variant) incidentally noted. No lymphadenopathy noted in the abdomen or pelvis. Reproductive: Prostate gland and seminal vesicles are unremarkable in appearance. Other: No significant volume of ascites.  No pneumoperitoneum. Musculoskeletal: There are no aggressive appearing lytic or blastic lesions noted in the visualized  portions of the skeleton. IMPRESSION: 1. Overall, the appearance of the small bowel and colon is concerning for inflammatory bowel disease such as Crohn's disease, as detailed above. Further clinical evaluation is recommended. 2. Hepatic steatosis. 3. 4 mm nonobstructive calculus in the lower pole collecting system of left kidney. 4. Cholelithiasis without evidence of acute cholecystitis at this time. 5. Additional incidental findings, as above. Electronically Signed   By: Vinnie Langton M.D.   On: 06/29/2020 17:02   US ABDOMEN LIMITED RUQ (LIVER/GB)  Result Date: 06/29/2020 CLINICAL DATA:  Right upper quadrant pain for 3 days. EXAM: ULTRASOUND ABDOMEN LIMITED RIGHT UPPER QUADRANT COMPARISON:  CT 06/29/2020 FINDINGS: Gallbladder: Contracted gallbladder with multiple stones. No wall thickening or edema. Murphy's sign is negative. Common bile duct: Diameter: 1.5 mm, normal Liver: Diffusely increased echotexture suggesting fatty infiltration. No focal lesions are identified. Portal vein is patent on color Doppler imaging with normal direction of blood flow towards the liver. Other: None. IMPRESSION: Cholelithiasis without additional changes to suggest acute cholecystitis. Probable diffuse fatty infiltration of the liver. Electronically Signed   By: Lucienne Capers M.D.   On: 06/29/2020 19:09   Medications:  Scheduled Meds: . [MAR Hold] bisacodyl  10 mg Oral Once  . [MAR Hold] heparin  5,000 Units Subcutaneous Q8H  . [MAR Hold] loratadine  10 mg Oral Daily   Continuous Infusions: . sodium chloride 50 mL/hr at 07/01/20 0034  . [MAR Hold] potassium chloride    . [MAR Hold] sodium chloride     PRN Meds:.[MAR Hold] acetaminophen **OR** [  MAR Hold] acetaminophen, [MAR Hold] albuterol, [MAR Hold] albuterol, [MAR Hold] ondansetron **OR** [MAR Hold] ondansetron (ZOFRAN) IV   Assessment: Active Problems:   Colitis   Enteritis    Plan: Proceed with colonoscopy for evaluation of GI symptoms and  abnormal imaging study, to rule out IBD  I have discussed alternative options, risks & benefits,  which include, but are not limited to, bleeding, infection, perforation,respiratory complication & drug reaction.  The patient agrees with this plan & written consent will be obtained.      LOS: 1 day   Vonda Antigua, MD 07/01/2020, 10:16 AM

## 2020-07-01 NOTE — Progress Notes (Signed)
Patient and wife given instructions to make follow up appointments, when to return for worsening symptoms, IV taken out by nursing student, Tele discontinued, medication, Heart Failure, & Colonoscopy education given, & patient given both doses of Potassium. Awaiting wheelchair.

## 2020-07-01 NOTE — Transfer of Care (Signed)
Immediate Anesthesia Transfer of Care Note  Patient: James [name]  Procedure(s) Performed: COLONOSCOPY (N/A )  Patient Location: Endoscopy Unit  Anesthesia Type:General  Level of Consciousness: awake, drowsy and patient cooperative  Airway & Oxygen Therapy: Patient Spontanous Breathing and Patient connected to face mask oxygen  Post-op Assessment: Report given to RN and Post -op Vital signs reviewed and stable  Post vital signs: Reviewed and stable  Last Vitals:  Vitals Value Taken Time  BP 112/67 07/01/20 1132  Temp 36.4 C 07/01/20 1131  Pulse 116 07/01/20 1138  Resp 21 07/01/20 1138  SpO2 96 % 07/01/20 1138  Vitals shown include unvalidated device data.  Last Pain:  Vitals:   07/01/20 1131  TempSrc: Temporal  PainSc: Asleep         Complications: No complications documented.

## 2020-07-01 NOTE — Progress Notes (Signed)
PT Cancellation Note  Patient Details Name: James [name] MRN: [id number] DOB: [birth year]   Cancelled Treatment:    Reason Eval/Treat Not Completed: Medical issues which prohibited therapy;Patient at procedure or test/unavailable (Pt off floor for GI study. Pts undergoing GA must have a new PT order or current order continuation for our services to continue. Will hold PT at this time, resume services once medically ready.)   Dev Dhondt C 07/01/2020, 2:30 PM

## 2020-07-01 NOTE — Evaluation (Signed)
Occupational Therapy Evaluation Patient Details Name: James [name] MRN: [id number] DOB: [birth year] Today's Date: 07/01/2020    History of Present Illness James [name] is a 71yoM who comes to [facility] on 4/4 after 2-3d N/V/D. He also reports increasing weakness in the last several weeks and several falls. PMH: Myotonic dystrophy type 1, cardiomyopathy, dysphagia, GERD, PNA, OSA. Pt hgas baseline hypotension, SBP often in 100s,, but has been lower over the last 2-3 weeks.   Clinical Impression   [name] presents today with generalized weakness and limited endurance, but denies pain, dizziness, or SOB. He reports having a wheelchair accessible home and Lucianne Lei and states that his home set-up and DME meet his needs; he has a power chair, scooter, RW, SPC, roll-in shower, shower chair, grab bars at toilet and shower. Pt is home alone during the day while his wife is at work. She assists him with dressing and prepares food for him before she leaves for work. He is able to manage his grooming, toileting, and feeding by himself during the day. He reports that, other than for medical appointments, he seldom leaves his home. Pt requires increased time and effort for bed mobility and sit<>stand, but reports that he is at or near his baseline for performance of functional mobility tasks. No follow up OT recommended at this time.     Follow Up Recommendations  No OT follow up    Equipment Recommendations  None recommended by OT    Recommendations for Other Services       Precautions / Restrictions Precautions Precautions: Fall Restrictions Weight Bearing Restrictions: No      Mobility Bed Mobility Overal bed mobility: Modified Independent             General bed mobility comments: supine to Right EOB    Transfers Overall transfer level: Needs assistance Equipment used: Rolling walker (2 wheeled) Transfers: Sit to/from Stand Sit to Stand: Min guard         General transfer comment:  sit<>stand, CGA for safety    Balance Overall balance assessment: History of Falls;Mild deficits observed, not formally tested;Modified Independent                                         ADL either performed or assessed with clinical judgement   ADL Overall ADL's : Needs assistance/impaired;At baseline                                             Vision Patient Visual Report: No change from baseline       Perception     Praxis      Pertinent Vitals/Pain Pain Assessment: No/denies pain     Hand Dominance     Extremity/Trunk Assessment Upper Extremity Assessment Upper Extremity Assessment: Generalized weakness   Lower Extremity Assessment Lower Extremity Assessment: Generalized weakness   Cervical / Trunk Assessment Cervical / Trunk Assessment: Normal   Communication Communication Communication: No difficulties   Cognition Arousal/Alertness: Awake/alert Behavior During Therapy: WFL for tasks assessed/performed;Flat affect Overall Cognitive Status: Within Functional Limits for tasks assessed  General Comments       Exercises Other Exercises Other Exercises: Educ re falls prevention, role of OT, ECS   Shoulder Instructions      Home Living   Living Arrangements: Spouse/significant other Available Help at Discharge: Family;Available PRN/intermittently Type of Home: House Home Access: Ramped entrance     Home Layout: One level               Home Equipment: Walker - 2 wheels;Cane - single point;Shower seat;Grab bars - toilet;Grab bars - tub/shower;Hand held shower head;Wheelchair - power;Electric scooter;Bedside commode   Additional Comments: Has wheelchair accessible van      Prior Functioning/Environment Level of Independence: Needs assistance  Gait / Transfers Assistance Needed: short household distances only are tolerated. ADL's / Homemaking Assistance  Needed: requires assistance with dressing, bathing. Can toilet IND, heat prepared foods   Comments: Pt reports seldom leaving home        OT Problem List: Decreased strength;Decreased activity tolerance;Decreased coordination;Impaired UE functional use      OT Treatment/Interventions:      OT Goals(Current goals can be found in the care plan section) Acute Rehab OT Goals Patient Stated Goal: return to home and have less bowel irritability OT Goal Formulation: With patient Time For Goal Achievement: 07/15/20 Potential to Achieve Goals: Good  OT Frequency:     Barriers to D/C:            Co-evaluation              AM-PAC OT "6 Clicks" Daily Activity     Outcome Measure Help from another person eating meals?: A Little Help from another person taking care of personal grooming?: A Little Help from another person toileting, which includes using toliet, bedpan, or urinal?: A Little Help from another person bathing (including washing, rinsing, drying)?: A Lot Help from another person to put on and taking off regular upper body clothing?: A Little Help from another person to put on and taking off regular lower body clothing?: A Lot 6 Click Score: 16   End of Session Equipment Utilized During Treatment: Rolling walker;Oxygen Nurse Communication: Mobility status  Activity Tolerance: Patient tolerated treatment well Patient left: in bed;with call bell/phone within reach;with nursing/sitter in room;with family/visitor present  OT Visit Diagnosis: Unsteadiness on feet (R26.81);Other abnormalities of gait and mobility (R26.89);Muscle weakness (generalized) (M62.81)                Time: 6160-7371 OT Time Calculation (min): 24 min Charges:  OT General Charges $OT Visit: 1 Visit OT Evaluation $OT Eval Moderate Complexity: 1 Mod OT Treatments $Self Care/Home Management : 23-37 mins  Josiah Lobo, PhD, MS, OTR/L 07/01/20, 3:37 PM

## 2020-07-01 NOTE — Anesthesia Procedure Notes (Signed)
Procedure Name: General with mask airway Performed by: Kelton Pillar, CRNA Pre-anesthesia Checklist: Patient identified, Emergency Drugs available, Suction available and Patient being monitored Patient Re-evaluated:Patient Re-evaluated prior to induction Oxygen Delivery Method: Circle system utilized Preoxygenation: Pre-oxygenation with 100% oxygen Induction Type: IV induction Ventilation: Oral airway inserted - appropriate to patient size and Mask ventilation throughout procedure Placement Confirmation: positive ETCO2,  CO2 detector and breath sounds checked- equal and bilateral Dental Injury: Teeth and Oropharynx as per pre-operative assessment  Comments: Elective use of circuit d/t pt condition=w/positive results TFH

## 2020-07-01 NOTE — Progress Notes (Signed)
RT has been to patient room to start his bipap for the night but patient is taking medicine for colonoscopy in the am and is using the bathroom multiple times. Pt cannot stop getting out of the bed to use the bathroom because of the medication. Pt cant keep getting up with bipap mask on so RRT left the patient off for the night. Pt can use supplement oxygen if need be but at this time pt cannot use machine right at this moment.

## 2020-07-01 NOTE — Progress Notes (Signed)
OT Cancellation Note  Patient Details Name: James [name] MRN: [id number] DOB: 1965-04-25   Cancelled Treatment:    Reason Eval/Treat Not Completed: Patient at procedure or test/ unavailable. Pt off floor for colonoscopy. Will attempt OT evaluation at a later time/date, as pt is available.  Josiah Lobo, PhD, MS, OTR/L 07/01/20, 11:05 AM

## 2020-07-01 NOTE — Progress Notes (Signed)
Notified Dr. Bonna Gains that pt was unable to finish bowel prep. Orders given to allow pt to finish what he is able to until 0800am. Will continue to monitor.

## 2020-07-01 NOTE — Discharge Summary (Signed)
Physician Discharge Summary  James [name]:[id number] DOB: Oct 17, 1965 DOA: 06/29/2020  PCP: Leone Haven, MD  Admit date: 06/29/2020 Discharge date: 07/01/2020  Admitted From: Home Disposition: Home  Recommendations for Outpatient Follow-up:  1. Follow up with PCP in 1-2 weeks 2. Follow-up with gastroenterology 3. Please obtain BMP/CBC in one week 4. 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  Patient had a similar episode in July 2021 and he was admitted back then.  He was found to have partial small bowel obstruction versus ileus in the setting of nonspecific enteritis, which was managed conservatively. Patient had CT abdomen with some concern of IBD, gastroenterology was consulted and he was taken for colonoscopy which shows multiple polyps which were removed.  No other sign of any inflammation.  Polyps were removed and pathology pending.  He will follow-up with gastroenterology as an outpatient.  On admission he had hypokalemia most likely secondary to GI losses which were repleted.  Diarrhea resolved.  Patient was able to tolerate diet without any nausea and vomiting.  Patient has an history of cardiomyopathy with mild LV and RV dysfunction.  Appears euvolemic.  Had ICD/pacemaker in place.  He will follow-up with his providers for further recommendations.  His home dose of lisinopril was discontinued due to borderline blood pressure.  He will follow-up with his primary care provider and lisinopril can be restarted if  needed.  He will continue rest of his home medications and follow-up with his providers.  Discharge Diagnoses:  Active Problems:   Colitis   Enteritis   Polyp of colon   Nausea vomiting and diarrhea   RUQ pain   Discharge Instructions  Discharge Instructions    Diet - low sodium heart healthy   Complete by: As directed    Discharge instructions   Complete by: As directed    It was pleasure taking care of you. I am stopping your lisinopril as you have blood pressure was little low. Keep yourself well-hydrated and follow-up with your primary care provider who can restart if needed. Follow-up with gastroenterology.   Increase activity slowly   Complete by: As directed      Allergies as of 07/01/2020      Reactions   Codeine Nausea Only      Medication List    STOP taking these medications   lisinopril 5 MG tablet Commonly known as: ZESTRIL     TAKE these medications   acetaminophen 500 MG tablet Commonly known as: TYLENOL Take 500-1,000 mg by mouth every 6 (six) hours as needed for moderate pain or headache.   albuterol (2.5 MG/3ML) 0.083% nebulizer solution Commonly known as: PROVENTIL Take 3 mLs (2.5 mg total) by nebulization every 6 (six) hours as needed for wheezing or shortness of breath.   docusate sodium 100 MG capsule Commonly known as: COLACE Take 100 mg by mouth daily as needed for mild constipation.   multivitamin tablet Take 1 tablet by mouth daily.   ZOFRAN PO Take 1 tablet by mouth every 8 (eight) hours as needed (nausea/vomiting).       Follow-up Information  Leone Haven, MD. Schedule an appointment as soon as possible for a visit.   Specialty: Family Medicine Contact information: [place] [address] [phone]        Virgel Manifold, MD. Schedule an appointment as soon as possible for a visit in 1 week(s).   Specialty: Gastroenterology Contact information: [place] [address] [phone]              Allergies  Allergen Reactions  . Codeine Nausea Only    Consultations:  Gastroenterology  Procedures/Studies: CT Head Wo Contrast  Result Date: 06/29/2020 CLINICAL DATA:  Dizziness. EXAM: CT HEAD WITHOUT CONTRAST TECHNIQUE: Contiguous axial images were obtained from the base of the skull through the vertex without intravenous contrast. COMPARISON:  Vertebra 12/15/2013. FINDINGS: Brain: No evidence of acute infarction, hemorrhage, hydrocephalus, extra-axial collection or mass lesion/mass effect. Mild to moderate patchy white matter hypoattenuation, nonspecific but most likely related to chronic microvascular ischemic disease. Vascular: No hyperdense vessel identified Skull: No acute fracture. No substantial change in a partially calcified right parietal scalp lesion, likely benign given long-term stability. Sinuses/Orbits: Mild ethmoid air cell mucosal thickening. Otherwise, visualized sinuses are clear. Unremarkable orbits. Other: No mastoid effusions. IMPRESSION: 1. No evidence of acute intracranial abnormality. 2. Mild to moderate chronic microvascular ischemic disease, progressed from prior. Electronically Signed   By: Margaretha Sheffield MD   On: 06/29/2020 16:51   CT ABDOMEN PELVIS W CONTRAST  Result Date: 06/29/2020 CLINICAL DATA:  55 year old male with history of abdominal pain and dizziness. EXAM: CT ABDOMEN AND PELVIS WITH CONTRAST TECHNIQUE: Multidetector CT imaging of the abdomen and pelvis was performed using the standard protocol following bolus administration of intravenous contrast. CONTRAST:  170mL OMNIPAQUE IOHEXOL 300 MG/ML  SOLN COMPARISON:  CT the abdomen and pelvis 10/13/2019. FINDINGS: Lower chest: Linear scarring in the lower lobes of the lungs bilaterally. 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 Hepatobiliary: Diffuse low  attenuation throughout the visualized hepatic parenchyma, indicative of hepatic steatosis. No suspicious cystic or solid hepatic lesions. No intra or extrahepatic biliary ductal dilatation. Several small calcified gallstones are noted within the lumen of the gallbladder which is completely decompressed around the gallstones. No surrounding inflammatory changes. Pancreas: No pancreatic mass. No pancreatic ductal dilatation. No pancreatic or peripancreatic fluid collections or inflammatory changes. Spleen: Unremarkable. Adrenals/Urinary Tract: 4 mm nonobstructive calculus in the lower pole collecting system of left kidney. Multiple low-attenuation lesions in both kidneys, compatible with simple cysts, largest of which is exophytic in the upper pole of the left kidney measuring 4.2 cm in diameter. Bilateral adrenal glands are normal in appearance. No hydroureteronephrosis. Urinary bladder is normal in appearance. Stomach/Bowel: Normal appearance of the stomach. Multiple prominent borderline dilated and mildly dilated loops of small bowel most evident throughout the mid small bowel, measuring up to 4.4 cm in diameter. Within the dilated loops of small bowel, there are areas of mural thickening and mucosal hyperenhancement best appreciated on axial image 58 of series 3. Several additional areas of mural thickening and mucosal hyperenhancement are also noted elsewhere throughout the small bowel and colon, including the distal and terminal ileum which is completely decompressed, hepatic flexure of the colon, several regions in the transverse colon, and distal sigmoid colon and rectum. Normal appendix. Vascular/Lymphatic: No significant atherosclerotic disease, aneurysm or dissection noted in the abdominal or pelvic vasculature. Retroaortic left renal  vein (normal anatomical variant) incidentally noted. No lymphadenopathy noted in the abdomen or pelvis. Reproductive: Prostate gland and seminal vesicles are unremarkable in  appearance. Other: No significant volume of ascites.  No pneumoperitoneum. Musculoskeletal: There are no aggressive appearing lytic or blastic lesions noted in the visualized portions of the skeleton. IMPRESSION: 1. Overall, the appearance of the small bowel and colon is concerning for inflammatory bowel disease such as Crohn's disease, as detailed above. Further clinical evaluation is recommended. 2. Hepatic steatosis. 3. 4 mm nonobstructive calculus in the lower pole collecting system of left kidney. 4. Cholelithiasis without evidence of acute cholecystitis at this time. 5. Additional incidental findings, as above. Electronically Signed   By: Vinnie Langton M.D.   On: 06/29/2020 17:02   US ABDOMEN LIMITED RUQ (LIVER/GB)  Result Date: 06/29/2020 CLINICAL DATA:  Right upper quadrant pain for 3 days. EXAM: ULTRASOUND ABDOMEN LIMITED RIGHT UPPER QUADRANT COMPARISON:  CT 06/29/2020 FINDINGS: Gallbladder: Contracted gallbladder with multiple stones. No wall thickening or edema. Murphy's sign is negative. Common bile duct: Diameter: 1.5 mm, normal Liver: Diffusely increased echotexture suggesting fatty infiltration. No focal lesions are identified. Portal vein is patent on color Doppler imaging with normal direction of blood flow towards the liver. Other: None. IMPRESSION: Cholelithiasis without additional changes to suggest acute cholecystitis. Probable diffuse fatty infiltration of the liver. Electronically Signed   By: Lucienne Capers M.D.   On: 06/29/2020 19:09     Subjective: Patient was seen and examined after colonoscopy today.  Denies any pain, nausea, vomiting or diarrhea.  He wants to go home.  Wife at bedside.  Discharge Exam: Vitals:   07/01/20 1525 07/01/20 1617  BP: 90/60 (!) 94/57  Pulse: 93 89  Resp:  18  Temp: 97.6 F (36.4 C) (!) 97.5 F (36.4 C)  SpO2: 96% 95%   Vitals:   07/01/20 1211 07/01/20 1245 07/01/20 1525 07/01/20 1617  BP: 94/62 92/60 90/60  (!) 94/57  Pulse: 100 (!)  101 93 89  Resp: 18 20  18   Temp: (!) 97.4 F (36.3 C) 98.1 F (36.7 C) 97.6 F (36.4 C) (!) 97.5 F (36.4 C)  TempSrc: Temporal Oral Oral   SpO2: 94% 95% 96% 95%  Weight:      Height:        General: Pt is alert, awake, not in acute distress Cardiovascular: RRR, S1/S2 +, no rubs, no gallops Respiratory: CTA bilaterally, no wheezing, no rhonchi Abdominal: Soft, NT, ND, bowel sounds + Extremities: no edema, no cyanosis   The results of significant diagnostics from this hospitalization (including imaging, microbiology, ancillary and laboratory) are listed below for reference.    Microbiology: Recent Results (from the past 240 hour(s))  Resp Panel by RT-PCR (Flu A&B, Covid) Nasopharyngeal Swab     Status: None   Collection Time: 06/29/20  4:03 PM   Specimen: Nasopharyngeal Swab; Nasopharyngeal(NP) swabs in vial transport medium  Result Value Ref Range Status   SARS Coronavirus 2 by RT PCR NEGATIVE NEGATIVE Final    Comment: (NOTE) SARS-CoV-2 target nucleic acids are NOT DETECTED.  The SARS-CoV-2 RNA is generally detectable in upper respiratory specimens during the acute phase of infection. The lowest concentration of SARS-CoV-2 viral copies this assay can detect is 138 copies/mL. A negative result does not preclude SARS-Cov-2 infection and should not be used as the sole basis for treatment or other patient management decisions. A negative result may occur with  improper specimen collection/handling, submission of specimen other than nasopharyngeal swab, presence of  viral mutation(s) within the areas targeted by this assay, and inadequate number of viral copies(<138 copies/mL). A negative result must be combined with clinical observations, patient history, and epidemiological information. The expected result is Negative.  Fact Sheet for Patients:  EntrepreneurPulse.com.au  Fact Sheet for Healthcare Providers:   IncredibleEmployment.be  This test is no t yet approved or cleared by the [place] FDA and  has been authorized for detection and/or diagnosis of SARS-CoV-2 by FDA under an Emergency Use Authorization (EUA). This EUA will remain  in effect (meaning this test can be used) for the duration of the COVID-19 declaration under Section 564(b)(1) of the Act, 21 U.S.C.section 360bbb-3(b)(1), unless the authorization is terminated  or revoked sooner.       Influenza A by PCR NEGATIVE NEGATIVE Final   Influenza B by PCR NEGATIVE NEGATIVE Final    Comment: (NOTE) The Xpert Xpress SARS-CoV-2/FLU/RSV plus assay is intended as an aid in the diagnosis of influenza from Nasopharyngeal swab specimens and should not be used as a sole basis for treatment. Nasal washings and aspirates are unacceptable for Xpert Xpress SARS-CoV-2/FLU/RSV testing.  Fact Sheet for Patients: EntrepreneurPulse.com.au  Fact Sheet for Healthcare Providers: IncredibleEmployment.be  This test is not yet approved or cleared by the [place] FDA and has been authorized for detection and/or diagnosis of SARS-CoV-2 by FDA under an Emergency Use Authorization (EUA). This EUA will remain in effect (meaning this test can be used) for the duration of the COVID-19 declaration under Section 564(b)(1) of the Act, 21 U.S.C. section 360bbb-3(b)(1), unless the authorization is terminated or revoked.  Performed at [facility], [address] [place], [place] [address]   Gastrointestinal Panel by PCR , Stool     Status: None   Collection Time: 06/29/20  9:20 PM   Specimen: Stool  Result Value Ref Range Status   Campylobacter species NOT DETECTED NOT DETECTED Final   Plesimonas shigelloides NOT DETECTED NOT DETECTED Final   Salmonella species NOT DETECTED NOT DETECTED Final   Yersinia enterocolitica NOT DETECTED NOT DETECTED Final   Vibrio species NOT  DETECTED NOT DETECTED Final   Vibrio cholerae NOT DETECTED NOT DETECTED Final   Enteroaggregative E coli (EAEC) NOT DETECTED NOT DETECTED Final   Enteropathogenic E coli (EPEC) NOT DETECTED NOT DETECTED Final   Enterotoxigenic E coli (ETEC) NOT DETECTED NOT DETECTED Final   Shiga like toxin producing E coli (STEC) NOT DETECTED NOT DETECTED Final   Shigella/Enteroinvasive E coli (EIEC) NOT DETECTED NOT DETECTED Final   Cryptosporidium NOT DETECTED NOT DETECTED Final   Cyclospora cayetanensis NOT DETECTED NOT DETECTED Final   Entamoeba histolytica NOT DETECTED NOT DETECTED Final   Giardia lamblia NOT DETECTED NOT DETECTED Final   Adenovirus F40/41 NOT DETECTED NOT DETECTED Final   Astrovirus NOT DETECTED NOT DETECTED Final   Norovirus GI/GII NOT DETECTED NOT DETECTED Final   Rotavirus A NOT DETECTED NOT DETECTED Final   Sapovirus (I, II, IV, and V) NOT DETECTED NOT DETECTED Final    Comment: Performed at [facility], [address] [place], [place] [address]  C Difficile Quick Screen w PCR reflex     Status: None   Collection Time: 06/29/20  9:20 PM   Specimen: STOOL  Result Value Ref Range Status   C Diff antigen NEGATIVE NEGATIVE Final   C Diff toxin NEGATIVE NEGATIVE Final   C Diff interpretation No C. difficile detected.  Final    Comment: Performed at [facility], [address] [place], [place] [address]     Labs: BNP (last 3 results) No results for input(s): BNP in the last 8760 hours. Basic Metabolic Panel: Recent Labs  Lab 06/29/20 1505 06/29/20 2324 06/30/20 0526 06/30/20 1435 07/01/20 0512  NA 139  --  140  --  140  K 3.5  --  2.7* 4.1 3.1*  CL 104  --  104  --  109  CO2 24  --  26  --  24  GLUCOSE 156*  --  96  --  90  BUN 9  --  9  --  5*  CREATININE 0.66 0.70 0.77  --  0.80  CALCIUM 8.9  --  8.6*  --  8.4*  MG  --   --   --   --  2.1   Liver Function Tests: Recent Labs  Lab 06/29/20 1505 06/30/20 0526 07/01/20 0512  AST 21 18  22   ALT 28 23 22   ALKPHOS 63 60 57  BILITOT 2.0* 1.7* 1.2  PROT 7.3 6.5 6.1*  ALBUMIN 3.8 3.4* 3.2*   Recent Labs  Lab 06/29/20 1505  LIPASE 30   No results for input(s): AMMONIA in the last 168 hours. CBC: Recent Labs  Lab 06/29/20 1505 06/29/20 2324 06/30/20 0526 07/01/20 0512  WBC 10.9* 8.6 8.8 5.5  HGB 16.3 15.5 15.1 13.7  HCT 48.1 46.5 45.2 41.6  MCV 94.7 95.5 96.0 95.6  PLT 263 230 221 184   Cardiac Enzymes: No results for input(s): CKTOTAL, CKMB, CKMBINDEX, TROPONINI in the last 168 hours. BNP: Invalid input(s): POCBNP CBG: No results for input(s): GLUCAP in the last 168 hours. D-Dimer No results for input(s): DDIMER in the last 72 hours. Hgb A1c No results for input(s): HGBA1C in the last 72 hours. Lipid Profile No results for input(s): CHOL, HDL, LDLCALC, TRIG, CHOLHDL, LDLDIRECT in the last 72 hours. Thyroid function studies No results for input(s): TSH, T4TOTAL, T3FREE, THYROIDAB in the last 72 hours.  Invalid input(s): FREET3 Anemia work up No results for input(s): VITAMINB12, FOLATE, FERRITIN, TIBC, IRON, RETICCTPCT in the last 72 hours. Urinalysis    Component Value Date/Time   COLORURINE YELLOW (A) 06/29/2020 1603   APPEARANCEUR CLEAR (A) 06/29/2020 1603   APPEARANCEUR Clear 01/23/2014 1256   LABSPEC >1.046 (H) 06/29/2020 1603   LABSPEC 1.006 01/23/2014 1256   PHURINE 6.0 06/29/2020 1603   GLUCOSEU NEGATIVE 06/29/2020 1603   GLUCOSEU Negative 01/23/2014 1256   HGBUR NEGATIVE 06/29/2020 1603   BILIRUBINUR NEGATIVE 06/29/2020 1603   BILIRUBINUR Negative 01/23/2014 1256   KETONESUR NEGATIVE 06/29/2020 1603   PROTEINUR NEGATIVE 06/29/2020 1603   NITRITE NEGATIVE 06/29/2020 1603   LEUKOCYTESUR NEGATIVE 06/29/2020 1603   LEUKOCYTESUR Negative 01/23/2014 1256   Sepsis Labs Invalid input(s): PROCALCITONIN,  WBC,  LACTICIDVEN Microbiology Recent Results (from the past 240 hour(s))  Resp Panel by RT-PCR (Flu A&B, Covid) Nasopharyngeal Swab      Status: None   Collection Time: 06/29/20  4:03 PM   Specimen: Nasopharyngeal Swab; Nasopharyngeal(NP) swabs in vial transport medium  Result Value Ref Range Status   SARS Coronavirus 2 by RT PCR NEGATIVE NEGATIVE Final    Comment: (NOTE) SARS-CoV-2 target nucleic acids are NOT DETECTED.  The SARS-CoV-2 RNA is generally detectable in upper respiratory specimens during the acute phase of infection. The lowest concentration of SARS-CoV-2 viral copies this assay can detect is 138 copies/mL. A negative result does not preclude SARS-Cov-2 infection and should not be used as the sole basis  for treatment or other patient management decisions. A negative result may occur with  improper specimen collection/handling, submission of specimen other than nasopharyngeal swab, presence of viral mutation(s) within the areas targeted by this assay, and inadequate number of viral copies(<138 copies/mL). A negative result must be combined with clinical observations, patient history, and epidemiological information. The expected result is Negative.  Fact Sheet for Patients:  EntrepreneurPulse.com.au  Fact Sheet for Healthcare Providers:  IncredibleEmployment.be  This test is no t yet approved or cleared by the [place] FDA and  has been authorized for detection and/or diagnosis of SARS-CoV-2 by FDA under an Emergency Use Authorization (EUA). This EUA will remain  in effect (meaning this test can be used) for the duration of the COVID-19 declaration under Section 564(b)(1) of the Act, 21 U.S.C.section 360bbb-3(b)(1), unless the authorization is terminated  or revoked sooner.       Influenza A by PCR NEGATIVE NEGATIVE Final   Influenza B by PCR NEGATIVE NEGATIVE Final    Comment: (NOTE) The Xpert Xpress SARS-CoV-2/FLU/RSV plus assay is intended as an aid in the diagnosis of influenza from Nasopharyngeal swab specimens and should not be used as a sole basis  for treatment. Nasal washings and aspirates are unacceptable for Xpert Xpress SARS-CoV-2/FLU/RSV testing.  Fact Sheet for Patients: EntrepreneurPulse.com.au  Fact Sheet for Healthcare Providers: IncredibleEmployment.be  This test is not yet approved or cleared by the [place] FDA and has been authorized for detection and/or diagnosis of SARS-CoV-2 by FDA under an Emergency Use Authorization (EUA). This EUA will remain in effect (meaning this test can be used) for the duration of the COVID-19 declaration under Section 564(b)(1) of the Act, 21 U.S.C. section 360bbb-3(b)(1), unless the authorization is terminated or revoked.  Performed at [facility], [address] [place], [place] [address]   Gastrointestinal Panel by PCR , Stool     Status: None   Collection Time: 06/29/20  9:20 PM   Specimen: Stool  Result Value Ref Range Status   Campylobacter species NOT DETECTED NOT DETECTED Final   Plesimonas shigelloides NOT DETECTED NOT DETECTED Final   Salmonella species NOT DETECTED NOT DETECTED Final   Yersinia enterocolitica NOT DETECTED NOT DETECTED Final   Vibrio species NOT DETECTED NOT DETECTED Final   Vibrio cholerae NOT DETECTED NOT DETECTED Final   Enteroaggregative E coli (EAEC) NOT DETECTED NOT DETECTED Final   Enteropathogenic E coli (EPEC) NOT DETECTED NOT DETECTED Final   Enterotoxigenic E coli (ETEC) NOT DETECTED NOT DETECTED Final   Shiga like toxin producing E coli (STEC) NOT DETECTED NOT DETECTED Final   Shigella/Enteroinvasive E coli (EIEC) NOT DETECTED NOT DETECTED Final   Cryptosporidium NOT DETECTED NOT DETECTED Final   Cyclospora cayetanensis NOT DETECTED NOT DETECTED Final   Entamoeba histolytica NOT DETECTED NOT DETECTED Final   Giardia lamblia NOT DETECTED NOT DETECTED Final   Adenovirus F40/41 NOT DETECTED NOT DETECTED Final   Astrovirus NOT DETECTED NOT DETECTED Final   Norovirus GI/GII NOT DETECTED NOT  DETECTED Final   Rotavirus A NOT DETECTED NOT DETECTED Final   Sapovirus (I, II, IV, and V) NOT DETECTED NOT DETECTED Final    Comment: Performed at [facility], [address] [place], [place] [address]  C Difficile Quick Screen w PCR reflex     Status: None   Collection Time: 06/29/20  9:20 PM   Specimen: STOOL  Result Value Ref Range Status   C Diff antigen NEGATIVE NEGATIVE Final   C Diff  toxin NEGATIVE NEGATIVE Final   C Diff interpretation No C. difficile detected.  Final    Comment: Performed at [facility], [address] [place], [place] [address]    Time coordinating discharge: Over 30 minutes  SIGNED:  Lorella Nimrod, MD  [facility] 07/01/2020, 4:31 PM  If 7PM-7AM, please contact night-coverage [email or web address]  This record has been created using Systems analyst. Errors have been sought and corrected,but may not always be located. Such creation errors do not reflect on the standard of care.

## 2020-07-02 ENCOUNTER — Encounter: Payer: Self-pay | Admitting: Gastroenterology

## 2020-07-02 ENCOUNTER — Telehealth: Payer: Self-pay

## 2020-07-02 LAB — CELIAC DISEASE PANEL
Endomysial Ab, IgA: NEGATIVE
IgA: 423 mg/dL — ABNORMAL HIGH (ref 90–386)
Tissue Transglutaminase Ab, IgA: <2 U/mL (ref 0–3)

## 2020-07-02 NOTE — Telephone Encounter (Signed)
Transition Care Management Follow-up Telephone Call  Date of discharge and from where: 07/01/20 from [facility]  How have you been since you were released from the hospital? Denies nausea, vomiting, diarrhea, abd pain. Patient states,"I'm okay."  Any questions or concerns? No  Items Reviewed:  Did the pt receive and understand the discharge instructions provided? Yes   Medications obtained and verified? Yes , taking all scheduled medications without issues.   Other? Stop taking lisinopril due to borderline BP. Restart as pcp directs.   Any new allergies since your discharge? No   Dietary orders reviewed? Yes, low sodium/heart healthy.  Do you have support at home? Yes   Home Care and Equipment/Supplies: Were home health services ordered? No  Functional Questionnaire: (I = Independent and D = Dependent) ADLs: I  Transferring/Ambulation- Walker in use  Follow up appointments reviewed:   PCP Hospital f/u appt confirmed? Yes  Scheduled to see Dr. Caryl Bis on 07/06/20 @ 4:00.  Needs BMP/CBC. Follow up polyp biopsy results.   [facility] f/u? Patient has not yet scheduled with gastroenterology. Nurse offered assistance and phone number, patient declined. Agrees to call the office back as needed.    Are transportation arrangements needed? No   If their condition worsens, is the pt aware to call PCP or go to the Emergency Dept.? Yes  Was the patient provided with contact information for the PCP's office or ED? Yes  Was to pt encouraged to call back with questions or concerns? Yes

## 2020-07-02 NOTE — Anesthesia Postprocedure Evaluation (Signed)
Anesthesia Post Note  Patient: James [name]  Procedure(s) Performed: COLONOSCOPY (N/A )  Patient location during evaluation: PACU Anesthesia Type: General Level of consciousness: awake and alert Pain management: pain level controlled Vital Signs Assessment: post-procedure vital signs reviewed and stable Respiratory status: spontaneous breathing, nonlabored ventilation and respiratory function stable Cardiovascular status: blood pressure returned to baseline and stable Postop Assessment: no apparent nausea or vomiting Anesthetic complications: no   No complications documented.   Last Vitals:  Vitals:   07/01/20 1525 07/01/20 1617  BP: 90/60 (!) 94/57  Pulse: 93 89  Resp:  18  Temp: 36.4 C (!) 36.4 C  SpO2: 96% 95%    Last Pain:  Vitals:   07/01/20 1525  TempSrc: Oral  PainSc:                  Tera Mater

## 2020-07-03 LAB — SURGICAL PATHOLOGY

## 2020-07-03 NOTE — Telephone Encounter (Signed)
Reviewed

## 2020-07-06 ENCOUNTER — Other Ambulatory Visit: Payer: Self-pay

## 2020-07-06 ENCOUNTER — Ambulatory Visit (INDEPENDENT_AMBULATORY_CARE_PROVIDER_SITE_OTHER): Payer: Medicare HMO | Admitting: Family Medicine

## 2020-07-06 ENCOUNTER — Encounter: Payer: Self-pay | Admitting: Family Medicine

## 2020-07-06 VITALS — BP 120/80 | HR 83 | Temp 98.1°F | Ht 70.0 in | Wt 211.8 lb

## 2020-07-06 DIAGNOSIS — K529 Noninfective gastroenteritis and colitis, unspecified: Secondary | ICD-10-CM

## 2020-07-06 DIAGNOSIS — E876 Hypokalemia: Secondary | ICD-10-CM | POA: Insufficient documentation

## 2020-07-06 DIAGNOSIS — I429 Cardiomyopathy, unspecified: Secondary | ICD-10-CM

## 2020-07-06 NOTE — Assessment & Plan Note (Signed)
Has improved quite a bit since discharge.  Does still continue to have some loose stool which may improve with time.  I agree with seeing GI at [facility] to get their input.  Advised to contact us if he has any worsening symptoms.  Check CBC today.

## 2020-07-06 NOTE — Patient Instructions (Signed)
Nice to see you. We will get labs today. Please make sure you see GI at [facility]. We will have you continue to hold your lisinopril.  Please continue to monitor your blood pressure.  Please follow-up with your cardiologist.

## 2020-07-06 NOTE — Progress Notes (Signed)
[name], MD Phone: [phone]  KYMANI SHIMABUKURO is a 55 y.o. male who presents today for f/u.  Colitis: Patient was hospitalized with diarrhea, nausea, and vomiting.  He was found to have colitis.  CT imaging was concerning for an inflammatory bowel disease though he underwent colonoscopy that did not reveal any inflammatory findings and had biopsies that did reveal colitis that was likely self-limited or infectious in nature.  He notes he feels quite a bit better since discharge though does continue to have loose stools each day.  His wife contacted his muscular dystrophy physician at [facility] and they felt as though he is likely having gut motility issues and they have referred him to a GI specialist at [facility].  He notes no abdominal pain.  Hypokalemia: Potassium was noted to be low in the hospital.  Cardiomyopathy: Notes his blood pressure has been running 90/60 at times at home.  His lisinopril was held due to low blood pressure.  He said no chest pain or shortness of breath.  For about 1.5 months prior to going into the hospital he was having lightheadedness issues and those have improved since discharge.  No edema.  Social History   Tobacco Use  Smoking Status Never Smoker  Smokeless Tobacco Never Used    Current Outpatient Medications on File Prior to Visit  Medication Sig Dispense Refill  . acetaminophen (TYLENOL) 500 MG tablet Take 500-1,000 mg by mouth every 6 (six) hours as needed for moderate pain or headache.    . albuterol (PROVENTIL) (2.5 MG/3ML) 0.083% nebulizer solution Take 3 mLs (2.5 mg total) by nebulization every 6 (six) hours as needed for wheezing or shortness of breath. 150 mL 1  . docusate sodium (COLACE) 100 MG capsule Take 100 mg by mouth daily as needed for mild constipation.    . Multiple Vitamin (MULTIVITAMIN) tablet Take 1 tablet by mouth daily.    . Ondansetron HCl (ZOFRAN PO) Take 1 tablet by mouth every 8 (eight) hours as needed (nausea/vomiting).     No  current facility-administered medications on file prior to visit.     ROS see history of present illness  Objective  Physical Exam Vitals:   07/06/20 1632  BP: 120/80  Pulse: 83  Temp: 98.1 F (36.7 C)  SpO2: 97%    BP Readings from Last 3 Encounters:  07/06/20 120/80  07/01/20 (!) 94/57  10/16/19 107/70   Wt Readings from Last 3 Encounters:  07/06/20 211 lb 12.8 oz (96.1 kg)  06/29/20 230 lb (104.3 kg)  11/22/19 212 lb (96.2 kg)    Physical Exam Constitutional:      General: He is not in acute distress.    Appearance: He is not diaphoretic.  Cardiovascular:     Rate and Rhythm: Normal rate and regular rhythm.     Heart sounds: Normal heart sounds.  Pulmonary:     Effort: Pulmonary effort is normal.     Breath sounds: Normal breath sounds.  Abdominal:     General: Bowel sounds are normal.     Palpations: Abdomen is soft.     Comments: Slight discomfort on palpation of his abdomen, no distention, no guarding, no rebound  Skin:    General: Skin is warm and dry.  Neurological:     Mental Status: He is alert.      Assessment/Plan: Please see individual problem list.  Problem List Items Addressed This Visit    Cardiomyopathy [facility])    Given his borderline low blood pressures  at home he will remain off of lisinopril.  They will continue to monitor his blood pressure and if it trends up we may be able to restart this at a very low dose.      Colitis    Has improved quite a bit since discharge.  Does still continue to have some loose stool which may improve with time.  I agree with seeing GI at [facility] to get their input.  Advised to contact us if he has any worsening symptoms.  Check CBC today.      Relevant Orders   CBC   Hypokalemia - Primary    Recheck potassium.      Relevant Orders   Comp Met (CMET)       This visit occurred during the SARS-CoV-2 public health emergency.  Safety protocols were in place, including screening questions prior to the  visit, additional usage of staff PPE, and extensive cleaning of exam room while observing appropriate contact time as indicated for disinfecting solutions.    [name], MD [place]

## 2020-07-06 NOTE — Assessment & Plan Note (Signed)
Given his borderline low blood pressures at home he will remain off of lisinopril.  They will continue to monitor his blood pressure and if it trends up we may be able to restart this at a very low dose.

## 2020-07-06 NOTE — Assessment & Plan Note (Signed)
Recheck potassium 

## 2020-07-07 LAB — CBC
HCT: 44.4 % (ref 39.0–52.0)
Hemoglobin: 14.6 g/dL (ref 13.0–17.0)
MCHC: 32.8 g/dL (ref 30.0–36.0)
MCV: 95.8 fl (ref 78.0–100.0)
Platelets: 210 10*3/uL (ref 150.0–400.0)
RBC: 4.63 Mil/uL (ref 4.22–5.81)
RDW: 14.3 % (ref 11.5–15.5)
WBC: 6.4 10*3/uL (ref 4.0–10.5)

## 2020-07-07 LAB — COMPREHENSIVE METABOLIC PANEL
ALT: 24 U/L (ref 0–53)
AST: 24 U/L (ref 0–37)
Albumin: 3.6 g/dL (ref 3.5–5.2)
Alkaline Phosphatase: 76 U/L (ref 39–117)
BUN: 7 mg/dL (ref 6–23)
CO2: 32 mEq/L (ref 19–32)
Calcium: 9.6 mg/dL (ref 8.4–10.5)
Chloride: 106 mEq/L (ref 96–112)
Creatinine, Ser: 0.78 mg/dL (ref 0.40–1.50)
GFR: 101.13 mL/min (ref >60.00)
Glucose, Bld: 131 mg/dL — ABNORMAL HIGH (ref 70–99)
Potassium: 4.8 mEq/L (ref 3.5–5.1)
Sodium: 146 mEq/L — ABNORMAL HIGH (ref 135–145)
Total Bilirubin: 0.9 mg/dL (ref 0.2–1.2)
Total Protein: 6.4 g/dL (ref 6.0–8.3)

## 2020-07-14 DIAGNOSIS — Z95810 Presence of automatic (implantable) cardiac defibrillator: Secondary | ICD-10-CM | POA: Diagnosis not present

## 2020-07-19 DIAGNOSIS — Z95810 Presence of automatic (implantable) cardiac defibrillator: Secondary | ICD-10-CM | POA: Diagnosis not present

## 2020-07-31 DIAGNOSIS — G4733 Obstructive sleep apnea (adult) (pediatric): Secondary | ICD-10-CM | POA: Diagnosis not present

## 2020-08-05 ENCOUNTER — Ambulatory Visit: Payer: Medicare HMO | Admitting: Gastroenterology

## 2020-08-17 ENCOUNTER — Ambulatory Visit: Payer: Medicare HMO | Admitting: Family Medicine

## 2020-08-27 ENCOUNTER — Other Ambulatory Visit: Payer: Self-pay

## 2020-09-02 ENCOUNTER — Ambulatory Visit: Payer: Medicare HMO | Admitting: Family Medicine

## 2020-09-02 DIAGNOSIS — Z0289 Encounter for other administrative examinations: Secondary | ICD-10-CM

## 2020-09-11 ENCOUNTER — Telehealth: Payer: Self-pay

## 2020-09-11 ENCOUNTER — Ambulatory Visit: Payer: Medicare HMO

## 2020-09-11 NOTE — Telephone Encounter (Signed)
Called patient for scheduled awv. No answer. No voicemail. Reschedule as appropriate.

## 2020-09-25 DIAGNOSIS — I428 Other cardiomyopathies: Secondary | ICD-10-CM | POA: Diagnosis not present

## 2020-10-13 DIAGNOSIS — Z95810 Presence of automatic (implantable) cardiac defibrillator: Secondary | ICD-10-CM | POA: Diagnosis not present

## 2020-10-23 DIAGNOSIS — Z95810 Presence of automatic (implantable) cardiac defibrillator: Secondary | ICD-10-CM | POA: Diagnosis not present

## 2020-11-10 DIAGNOSIS — R1314 Dysphagia, pharyngoesophageal phase: Secondary | ICD-10-CM | POA: Diagnosis not present

## 2020-11-10 DIAGNOSIS — K567 Ileus, unspecified: Secondary | ICD-10-CM | POA: Diagnosis not present

## 2020-11-17 DIAGNOSIS — R1314 Dysphagia, pharyngoesophageal phase: Secondary | ICD-10-CM | POA: Diagnosis not present

## 2020-11-24 DIAGNOSIS — K2950 Unspecified chronic gastritis without bleeding: Secondary | ICD-10-CM | POA: Diagnosis not present

## 2020-11-24 DIAGNOSIS — G4733 Obstructive sleep apnea (adult) (pediatric): Secondary | ICD-10-CM | POA: Diagnosis not present

## 2020-11-24 DIAGNOSIS — Z79899 Other long term (current) drug therapy: Secondary | ICD-10-CM | POA: Diagnosis not present

## 2020-11-24 DIAGNOSIS — I429 Cardiomyopathy, unspecified: Secondary | ICD-10-CM | POA: Diagnosis not present

## 2020-11-24 DIAGNOSIS — K3189 Other diseases of stomach and duodenum: Secondary | ICD-10-CM | POA: Diagnosis not present

## 2020-11-24 DIAGNOSIS — R933 Abnormal findings on diagnostic imaging of other parts of digestive tract: Secondary | ICD-10-CM | POA: Diagnosis not present

## 2020-11-24 DIAGNOSIS — R1319 Other dysphagia: Secondary | ICD-10-CM | POA: Diagnosis not present

## 2020-11-24 DIAGNOSIS — K219 Gastro-esophageal reflux disease without esophagitis: Secondary | ICD-10-CM | POA: Diagnosis not present

## 2020-11-24 DIAGNOSIS — K31A11 Gastric intestinal metaplasia without dysplasia, involving the antrum: Secondary | ICD-10-CM | POA: Diagnosis not present

## 2020-11-24 DIAGNOSIS — K31A0 Gastric intestinal metaplasia, unspecified: Secondary | ICD-10-CM | POA: Diagnosis not present

## 2020-12-09 ENCOUNTER — Ambulatory Visit: Payer: Medicare HMO

## 2020-12-09 ENCOUNTER — Telehealth: Payer: Self-pay

## 2020-12-09 NOTE — Telephone Encounter (Signed)
Unable to reach patient for scheduled appointment on preferred number. Unable to leave message, no voicemail. Reschedule.

## 2020-12-14 DIAGNOSIS — G4733 Obstructive sleep apnea (adult) (pediatric): Secondary | ICD-10-CM | POA: Diagnosis not present

## 2020-12-14 DIAGNOSIS — R0609 Other forms of dyspnea: Secondary | ICD-10-CM | POA: Diagnosis not present

## 2020-12-14 DIAGNOSIS — G7111 Myotonic muscular dystrophy: Secondary | ICD-10-CM | POA: Diagnosis not present

## 2020-12-14 DIAGNOSIS — I420 Dilated cardiomyopathy: Secondary | ICD-10-CM | POA: Diagnosis not present

## 2021-01-13 DIAGNOSIS — K219 Gastro-esophageal reflux disease without esophagitis: Secondary | ICD-10-CM | POA: Diagnosis not present

## 2021-01-13 DIAGNOSIS — R1312 Dysphagia, oropharyngeal phase: Secondary | ICD-10-CM | POA: Diagnosis not present

## 2021-01-13 DIAGNOSIS — R1314 Dysphagia, pharyngoesophageal phase: Secondary | ICD-10-CM | POA: Diagnosis not present

## 2021-01-14 DIAGNOSIS — Z95810 Presence of automatic (implantable) cardiac defibrillator: Secondary | ICD-10-CM | POA: Diagnosis not present

## 2021-01-15 DIAGNOSIS — R1314 Dysphagia, pharyngoesophageal phase: Secondary | ICD-10-CM | POA: Diagnosis not present

## 2021-01-15 DIAGNOSIS — I480 Paroxysmal atrial fibrillation: Secondary | ICD-10-CM | POA: Diagnosis not present

## 2021-01-15 DIAGNOSIS — R1310 Dysphagia, unspecified: Secondary | ICD-10-CM | POA: Diagnosis not present

## 2021-01-15 DIAGNOSIS — Z95810 Presence of automatic (implantable) cardiac defibrillator: Secondary | ICD-10-CM | POA: Diagnosis not present

## 2021-01-15 DIAGNOSIS — G7111 Myotonic muscular dystrophy: Secondary | ICD-10-CM | POA: Diagnosis not present

## 2021-01-15 DIAGNOSIS — Z23 Encounter for immunization: Secondary | ICD-10-CM | POA: Diagnosis not present

## 2021-02-05 ENCOUNTER — Emergency Department: Payer: Medicare HMO

## 2021-02-05 ENCOUNTER — Inpatient Hospital Stay
Admission: EM | Admit: 2021-02-05 | Discharge: 2021-02-09 | DRG: 177 | Disposition: A | Discharge status: Home Health | Payer: Medicare HMO | Attending: Obstetrics and Gynecology | Admitting: Obstetrics and Gynecology

## 2021-02-05 ENCOUNTER — Other Ambulatory Visit: Payer: Self-pay

## 2021-02-05 ENCOUNTER — Inpatient Hospital Stay: Payer: Medicare HMO

## 2021-02-05 ENCOUNTER — Encounter: Payer: Self-pay | Admitting: Emergency Medicine

## 2021-02-05 DIAGNOSIS — K219 Gastro-esophageal reflux disease without esophagitis: Secondary | ICD-10-CM | POA: Diagnosis present

## 2021-02-05 DIAGNOSIS — R0602 Shortness of breath: Secondary | ICD-10-CM | POA: Diagnosis not present

## 2021-02-05 DIAGNOSIS — J9601 Acute respiratory failure with hypoxia: Secondary | ICD-10-CM

## 2021-02-05 DIAGNOSIS — R059 Cough, unspecified: Secondary | ICD-10-CM | POA: Diagnosis not present

## 2021-02-05 DIAGNOSIS — Z6828 Body mass index (BMI) 28.0-28.9, adult: Secondary | ICD-10-CM | POA: Diagnosis not present

## 2021-02-05 DIAGNOSIS — J9811 Atelectasis: Secondary | ICD-10-CM | POA: Diagnosis not present

## 2021-02-05 DIAGNOSIS — E669 Obesity, unspecified: Secondary | ICD-10-CM | POA: Diagnosis not present

## 2021-02-05 DIAGNOSIS — R945 Abnormal results of liver function studies: Secondary | ICD-10-CM | POA: Diagnosis not present

## 2021-02-05 DIAGNOSIS — I480 Paroxysmal atrial fibrillation: Secondary | ICD-10-CM | POA: Diagnosis present

## 2021-02-05 DIAGNOSIS — D696 Thrombocytopenia, unspecified: Secondary | ICD-10-CM | POA: Diagnosis present

## 2021-02-05 DIAGNOSIS — R7989 Other specified abnormal findings of blood chemistry: Secondary | ICD-10-CM

## 2021-02-05 DIAGNOSIS — Z66 Do not resuscitate: Secondary | ICD-10-CM | POA: Diagnosis present

## 2021-02-05 DIAGNOSIS — J9621 Acute and chronic respiratory failure with hypoxia: Secondary | ICD-10-CM | POA: Diagnosis present

## 2021-02-05 DIAGNOSIS — Z20822 Contact with and (suspected) exposure to covid-19: Secondary | ICD-10-CM | POA: Diagnosis not present

## 2021-02-05 DIAGNOSIS — J9600 Acute respiratory failure, unspecified whether with hypoxia or hypercapnia: Secondary | ICD-10-CM | POA: Diagnosis present

## 2021-02-05 DIAGNOSIS — R9431 Abnormal electrocardiogram [ECG] [EKG]: Secondary | ICD-10-CM | POA: Diagnosis not present

## 2021-02-05 DIAGNOSIS — I452 Bifascicular block: Secondary | ICD-10-CM | POA: Diagnosis present

## 2021-02-05 DIAGNOSIS — I429 Cardiomyopathy, unspecified: Secondary | ICD-10-CM | POA: Diagnosis not present

## 2021-02-05 DIAGNOSIS — J690 Pneumonitis due to inhalation of food and vomit: Principal | ICD-10-CM | POA: Diagnosis present

## 2021-02-05 DIAGNOSIS — R1310 Dysphagia, unspecified: Secondary | ICD-10-CM

## 2021-02-05 DIAGNOSIS — Z79899 Other long term (current) drug therapy: Secondary | ICD-10-CM

## 2021-02-05 DIAGNOSIS — G4733 Obstructive sleep apnea (adult) (pediatric): Secondary | ICD-10-CM | POA: Diagnosis present

## 2021-02-05 DIAGNOSIS — Z8249 Family history of ischemic heart disease and other diseases of the circulatory system: Secondary | ICD-10-CM | POA: Diagnosis not present

## 2021-02-05 DIAGNOSIS — E876 Hypokalemia: Secondary | ICD-10-CM | POA: Diagnosis present

## 2021-02-05 DIAGNOSIS — R1314 Dysphagia, pharyngoesophageal phase: Secondary | ICD-10-CM

## 2021-02-05 DIAGNOSIS — Z95810 Presence of automatic (implantable) cardiac defibrillator: Secondary | ICD-10-CM

## 2021-02-05 DIAGNOSIS — G7111 Myotonic muscular dystrophy: Secondary | ICD-10-CM | POA: Diagnosis present

## 2021-02-05 DIAGNOSIS — Z885 Allergy status to narcotic agent status: Secondary | ICD-10-CM

## 2021-02-05 DIAGNOSIS — I517 Cardiomegaly: Secondary | ICD-10-CM | POA: Diagnosis not present

## 2021-02-05 LAB — CBC WITH DIFFERENTIAL/PLATELET
Abs Immature Granulocytes: 0.06 10*3/uL (ref 0.00–0.07)
Basophils Absolute: 0.1 10*3/uL (ref 0.0–0.1)
Basophils Relative: 1 %
Eosinophils Absolute: 0.1 10*3/uL (ref 0.0–0.5)
Eosinophils Relative: 1 %
HCT: 45.5 % (ref 39.0–52.0)
Hemoglobin: 15 g/dL (ref 13.0–17.0)
Immature Granulocytes: 1 %
Lymphocytes Relative: 12 %
Lymphs Abs: 1.1 10*3/uL (ref 0.7–4.0)
MCH: 32.5 pg (ref 26.0–34.0)
MCHC: 33 g/dL (ref 30.0–36.0)
MCV: 98.5 fL (ref 80.0–100.0)
Monocytes Absolute: 0.6 10*3/uL (ref 0.1–1.0)
Monocytes Relative: 7 %
Neutro Abs: 7 10*3/uL (ref 1.7–7.7)
Neutrophils Relative %: 78 %
Platelets: 173 10*3/uL (ref 150–400)
RBC: 4.62 MIL/uL (ref 4.22–5.81)
RDW: 13.1 % (ref 11.5–15.5)
WBC: 8.9 10*3/uL (ref 4.0–10.5)
nRBC: 0 % (ref 0.0–0.2)

## 2021-02-05 LAB — LIPASE, BLOOD: Lipase: 27 U/L (ref 11–51)

## 2021-02-05 LAB — COMPREHENSIVE METABOLIC PANEL
ALT: 59 U/L — ABNORMAL HIGH (ref 0–44)
AST: 47 U/L — ABNORMAL HIGH (ref 15–41)
Albumin: 3.6 g/dL (ref 3.5–5.0)
Alkaline Phosphatase: 67 U/L (ref 38–126)
Anion gap: 6 (ref 5–15)
BUN: 9 mg/dL (ref 6–20)
CO2: 27 mmol/L (ref 22–32)
Calcium: 8.3 mg/dL — ABNORMAL LOW (ref 8.9–10.3)
Chloride: 105 mmol/L (ref 98–111)
Creatinine, Ser: 0.76 mg/dL (ref 0.61–1.24)
GFR, Estimated: >60 mL/min (ref >60)
Glucose, Bld: 187 mg/dL — ABNORMAL HIGH (ref 70–99)
Potassium: 3.4 mmol/L — ABNORMAL LOW (ref 3.5–5.1)
Sodium: 138 mmol/L (ref 135–145)
Total Bilirubin: 2.4 mg/dL — ABNORMAL HIGH (ref 0.3–1.2)
Total Protein: 7.1 g/dL (ref 6.5–8.1)

## 2021-02-05 LAB — RESP PANEL BY RT-PCR (FLU A&B, COVID) ARPGX2
Influenza A by PCR: NEGATIVE
Influenza B by PCR: NEGATIVE
SARS Coronavirus 2 by RT PCR: NEGATIVE

## 2021-02-05 LAB — HIV ANTIBODY (ROUTINE TESTING W REFLEX): HIV Screen 4th Generation wRfx: NONREACTIVE

## 2021-02-05 LAB — PROCALCITONIN
Procalcitonin: <0.1 ng/mL
Procalcitonin: <0.1 ng/mL

## 2021-02-05 LAB — TROPONIN I (HIGH SENSITIVITY)
Troponin I (High Sensitivity): 8 ng/L (ref <18)
Troponin I (High Sensitivity): 7 ng/L (ref <18)

## 2021-02-05 LAB — BRAIN NATRIURETIC PEPTIDE: B Natriuretic Peptide: 104.6 pg/mL — ABNORMAL HIGH (ref 0.0–100.0)

## 2021-02-05 LAB — C-REACTIVE PROTEIN: CRP: 7.4 mg/dL — ABNORMAL HIGH (ref <1.0)

## 2021-02-05 LAB — LACTIC ACID, PLASMA
Lactic Acid, Venous: 1.3 mmol/L (ref 0.5–1.9)
Lactic Acid, Venous: 0.8 mmol/L (ref 0.5–1.9)

## 2021-02-05 MED ORDER — ACETAMINOPHEN 500 MG PO TABS
500.0000 mg | ORAL_TABLET | Freq: Four times a day (QID) | ORAL | Status: DC | PRN
Start: 2021-02-05 — End: 2021-02-09

## 2021-02-05 MED ORDER — ONDANSETRON HCL 4 MG/2ML IJ SOLN: 4.0000 mg | Freq: Four times a day (QID) | INTRAMUSCULAR | Status: DC | PRN

## 2021-02-05 MED ORDER — IOHEXOL 350 MG/ML SOLN
75.0000 mL | Freq: Once | INTRAVENOUS | Status: AC | PRN
  Administered 2021-02-05: 75 mL via INTRAVENOUS

## 2021-02-05 MED ORDER — IPRATROPIUM-ALBUTEROL 0.5-2.5 (3) MG/3ML IN SOLN
3.0000 mL | Freq: Once | RESPIRATORY_TRACT | Status: AC
  Administered 2021-02-05: 3 mL via RESPIRATORY_TRACT
  Filled 2021-02-05: qty 3

## 2021-02-05 MED ORDER — SODIUM CHLORIDE 0.9% FLUSH: 3.0000 mL | INTRAVENOUS | Status: DC | PRN

## 2021-02-05 MED ORDER — SODIUM CHLORIDE 0.9 % IV SOLN
3.0000 g | Freq: Once | INTRAVENOUS | Status: AC
  Administered 2021-02-05: 3 g via INTRAVENOUS
  Filled 2021-02-05: qty 8

## 2021-02-05 MED ORDER — ADULT MULTIVITAMIN W/MINERALS CH
1.0000 | ORAL_TABLET | Freq: Every day | ORAL | Status: DC
  Administered 2021-02-06 – 2021-02-09 (×4): 1 via ORAL
  Filled 2021-02-05 (×4): qty 1

## 2021-02-05 MED ORDER — SODIUM CHLORIDE 0.9 % IV SOLN
3.0000 g | Freq: Four times a day (QID) | INTRAVENOUS | Status: DC
  Administered 2021-02-05 – 2021-02-09 (×17): 3 g via INTRAVENOUS
  Filled 2021-02-05: qty 8
  Filled 2021-02-05 (×3): qty 3
  Filled 2021-02-05 (×4): qty 8
  Filled 2021-02-05: qty 3
  Filled 2021-02-05: qty 8
  Filled 2021-02-05 (×5): qty 3
  Filled 2021-02-05: qty 8
  Filled 2021-02-05: qty 3
  Filled 2021-02-05 (×4): qty 8

## 2021-02-05 MED ORDER — SODIUM CHLORIDE 0.9% FLUSH
3.0000 mL | Freq: Two times a day (BID) | INTRAVENOUS | Status: DC
  Administered 2021-02-05 – 2021-02-09 (×8): 3 mL via INTRAVENOUS

## 2021-02-05 MED ORDER — SODIUM CHLORIDE 0.9 % IV SOLN
3.0000 g | Freq: Three times a day (TID) | INTRAVENOUS | Status: DC
Start: 2021-02-05 — End: 2021-02-05
  Filled 2021-02-05 (×2): qty 8

## 2021-02-05 MED ORDER — ONDANSETRON HCL 4 MG PO TABS: 4.0000 mg | ORAL_TABLET | Freq: Four times a day (QID) | ORAL | Status: DC | PRN

## 2021-02-05 MED ORDER — ACETYLCYSTEINE 20 % IN SOLN
4.0000 mL | Freq: Two times a day (BID) | RESPIRATORY_TRACT | Status: DC
Start: 2021-02-05 — End: 2021-02-08
  Administered 2021-02-05 – 2021-02-08 (×6): 4 mL via RESPIRATORY_TRACT
  Filled 2021-02-05 (×6): qty 4

## 2021-02-05 MED ORDER — ALBUTEROL SULFATE (2.5 MG/3ML) 0.083% IN NEBU
2.5000 mg | INHALATION_SOLUTION | Freq: Four times a day (QID) | RESPIRATORY_TRACT | Status: DC | PRN
  Administered 2021-02-05 – 2021-02-06 (×2): 2.5 mg via RESPIRATORY_TRACT
  Filled 2021-02-05 (×2): qty 3

## 2021-02-05 MED ORDER — ACETAMINOPHEN 325 MG PO TABS
650.0000 mg | ORAL_TABLET | Freq: Once | ORAL | Status: AC
  Administered 2021-02-05: 650 mg via ORAL
  Filled 2021-02-05: qty 2

## 2021-02-05 MED ORDER — ACETYLCYSTEINE 20 % IN SOLN
4.0000 mL | Freq: Two times a day (BID) | RESPIRATORY_TRACT | Status: DC
  Filled 2021-02-05 (×2): qty 4

## 2021-02-05 MED ORDER — SODIUM CHLORIDE 0.9 % IV SOLN: 250.0000 mL | INTRAVENOUS | Status: DC | PRN

## 2021-02-05 MED ORDER — POTASSIUM CHLORIDE CRYS ER 20 MEQ PO TBCR
40.0000 meq | EXTENDED_RELEASE_TABLET | Freq: Once | ORAL | Status: AC
  Administered 2021-02-05: 40 meq via ORAL
  Filled 2021-02-05: qty 2

## 2021-02-05 MED ORDER — DOCUSATE SODIUM 100 MG PO CAPS
100.0000 mg | ORAL_CAPSULE | Freq: Every day | ORAL | Status: DC
  Administered 2021-02-06 – 2021-02-09 (×4): 100 mg via ORAL
  Filled 2021-02-05 (×4): qty 1

## 2021-02-05 MED ORDER — ENOXAPARIN SODIUM 40 MG/0.4ML IJ SOSY
40.0000 mg | PREFILLED_SYRINGE | INTRAMUSCULAR | Status: DC
  Administered 2021-02-05 – 2021-02-09 (×5): 40 mg via SUBCUTANEOUS
  Filled 2021-02-05 (×5): qty 0.4

## 2021-02-05 NOTE — ED Notes (Signed)
John RN aware of assigned bed 

## 2021-02-05 NOTE — Consult Note (Signed)
Pulmonary Medicine          Date: 02/05/2021,   MRN# [id number] [name] 1966-01-10     AdmissionWeight: 91.2 kg                 CurrentWeight: 91.2 kg   Referring physician: Dr Francine Graven   CHIEF COMPLAINT:   Acute on chronic hypoxemic respiratory failure in context of muscular dystrophy   HISTORY OF PRESENT ILLNESS   This is a pleasant 55yo M with hs of MD, GERD, OSA, Cardiomyopathy s/p ICD came in with SOB x2 d.  Wife of patient reported NV and signs of aspiration with regurgitation. She noted acute hypoxemia at home <80%. Labs show sodium 138, potassium 3.4, chloride 105, bicarb 27, glucose 187, BUN 9, creatinine 0.76, calcium 8.3, alkaline phosphatase 67, albumin 3.6, lipase 27, AST 47, ALT 59, total protein 7.1, BNP 104, troponin 8, lactic acid 1.3, procalcitonin less than 0.10, white count 8.9, hemoglobin 15.0, hematocrit 45.5, MCV 98.5, RDW 13.1, platelet count 173. CT angio was done and is negative for PE but + for bibasilar atelectasis and possible pneumonia with cardiomegaly.PCCM consultation for further evaluation and management.     PAST MEDICAL HISTORY   Past Medical History:  Diagnosis Date  . Bronchitis   . Cardiomyopathy ([place])   . Dysphagia   . GERD (gastroesophageal reflux disease)   . History of chicken pox   . History of pneumonia 2013   double PNA at [facility]  . LBBB (left bundle branch block)   . Myotonic dystrophy, type 1 ([place])    followed by [facility]  . Pneumonia    aspiration  . Positive colorectal cancer screening using Cologuard test 03/31/2017  . Sleep apnea      SURGICAL HISTORY   Past Surgical History:  Procedure Laterality Date  . CARDIAC DEFIBRILLATOR PLACEMENT     defib/pacer  . CARDIAC DEFIBRILLATOR PLACEMENT    . COLONOSCOPY N/A 07/01/2020   Procedure: COLONOSCOPY;  Surgeon: Virgel Manifold, MD;  Location: [facility] ENDOSCOPY;  Service: Endoscopy;  Laterality: N/A;  . COLONOSCOPY WITH PROPOFOL N/A 08/16/2016   Procedure:  COLONOSCOPY WITH PROPOFOL;  Surgeon: Jonathon Bellows, MD;  Location[facility] ENDOSCOPY;  Service: Gastroenterology;  Laterality: N/A;  . COLONOSCOPY WITH PROPOFOL N/A 04/04/2017   Procedure: COLONOSCOPY WITH PROPOFOL;  Surgeon: Jonathon Bellows, MD;  Location: [facility] ENDOSCOPY;  Service: Gastroenterology;  Laterality: N/A;  . PACEMAKER IMPLANT    . US ECHOCARDIOGRAPHY  09/2010   EF 50%, mld LVH and dysfunction, mild RV dysfunction and enlarged     FAMILY HISTORY   Family History  Problem Relation Age of Onset  . Hypertension Father   . Cancer Paternal Grandfather        lung, smoker  . Cancer Paternal Uncle        bone  . Diabetes Mother   . CAD Mother 38       MI  . Stroke Neg Hx      SOCIAL HISTORY   Social History   Tobacco Use  . Smoking status: Never  . Smokeless tobacco: Never  Vaping Use  . Vaping Use: Never used  Substance Use Topics  . Alcohol use: No  . Drug use: No     MEDICATIONS    Home Medication:    Current Medication:  Current Facility-Administered Medications:  .  0.9 %  sodium chloride infusion, 250 mL, Intravenous, PRN, Agbata, Tochukwu, MD .  acetaminophen (TYLENOL) tablet 500-1,000 mg, 500-1,000  mg, Oral, Q6H PRN, Agbata, Tochukwu, MD .  albuterol (PROVENTIL) (2.5 MG/3ML) 0.083% nebulizer solution 2.5 mg, 2.5 mg, Nebulization, Q6H PRN, Agbata, Tochukwu, MD .  Ampicillin-Sulbactam (UNASYN) 3 g in sodium chloride 0.9 % 100 mL IVPB, 3 g, Intravenous, Q8H, Agbata, Tochukwu, MD .  docusate sodium (COLACE) capsule 100 mg, 100 mg, Oral, Daily, Agbata, Tochukwu, MD .  enoxaparin (LOVENOX) injection 40 mg, 40 mg, Subcutaneous, Q24H, Agbata, Tochukwu, MD, 40 mg at 02/05/21 1154 .  multivitamin with minerals tablet 1 tablet, 1 tablet, Oral, Daily, Agbata, Tochukwu, MD .  ondansetron (ZOFRAN) tablet 4 mg, 4 mg, Oral, Q6H PRN **OR** ondansetron (ZOFRAN) injection 4 mg, 4 mg, Intravenous, Q6H PRN, Agbata, Tochukwu, MD .  sodium chloride flush (NS) 0.9 % injection 3 mL, 3  mL, Intravenous, Q12H, Agbata, Tochukwu, MD, 3 mL at 02/05/21 1154 .  sodium chloride flush (NS) 0.9 % injection 3 mL, 3 mL, Intravenous, PRN, Agbata, Tochukwu, MD    ALLERGIES   Codeine     REVIEW OF SYSTEMS    Review of Systems:  Gen:  Denies  fever, sweats, chills weigh loss  HEENT: Denies blurred vision, double vision, ear pain, eye pain, hearing loss, nose bleeds, sore throat Cardiac:  No dizziness, chest pain or heaviness, chest tightness,edema Resp:   Denies cough or sputum porduction, shortness of breath,wheezing, hemoptysis,  Gi: Denies swallowing difficulty, stomach pain, nausea or vomiting, diarrhea, constipation, bowel incontinence Gu:  Denies bladder incontinence, burning urine Ext:   Denies Joint pain, stiffness or swelling Skin: Denies  skin rash, easy bruising or bleeding or hives Endoc:  Denies polyuria, polydipsia , polyphagia or weight change Psych:   Denies depression, insomnia or hallucinations   Other:  All other systems negative   VS: BP 113/74 (BP Location: Right Arm)   Pulse 70   Temp 98.4 F (36.9 C)   Resp 18   Ht $R'5\' 11"'pK$  (1.803 m)   Wt 91.2 kg   SpO2 95%   BMI 28.03 kg/m      PHYSICAL EXAM    GENERAL:NAD, no fevers, chills, no weakness no fatigue HEAD: Normocephalic, atraumatic.  EYES: Pupils equal, round, reactive to light. Extraocular muscles intact. No scleral icterus.  MOUTH: Moist mucosal membrane. Dentition intact. No abscess noted.  EAR, NOSE, THROAT: Clear without exudates. No external lesions.  NECK: Supple. No thyromegaly. No nodules. No JVD.  PULMONARY: Diffuse coarse rhonchi right sided +wheezes CARDIOVASCULAR: S1 and S2. Regular rate and rhythm. No murmurs, rubs, or gallops. No edema. Pedal pulses 2+ bilaterally.  GASTROINTESTINAL: Soft, nontender, nondistended. No masses. Positive bowel sounds. No hepatosplenomegaly.  MUSCULOSKELETAL: No swelling, clubbing, or edema. Range of motion full in all extremities.  NEUROLOGIC:  Cranial nerves II through XII are intact. No gross focal neurological deficits. Sensation intact. Reflexes intact.  SKIN: No ulceration, lesions, rashes, or cyanosis. Skin warm and dry. Turgor intact.  PSYCHIATRIC: Mood, affect within normal limits. The patient is awake, alert and oriented x 3. Insight, judgment intact.       IMAGING    CT Angio Chest PE W and/or Wo Contrast  Result Date: 02/05/2021 CLINICAL DATA:  High probability suspicion for pulmonary embolus EXAM: CT ANGIOGRAPHY CHEST WITH CONTRAST TECHNIQUE: Multidetector CT imaging of the chest was performed using the standard protocol during bolus administration of intravenous contrast. Multiplanar CT image reconstructions and MIPs were obtained to evaluate the vascular anatomy. CONTRAST:  32mL OMNIPAQUE IOHEXOL 350 MG/ML SOLN COMPARISON:  Prior CT scan of the chest 10/06/2013  FINDINGS: Cardiovascular: Adequate opacification of the pulmonary arteries to the proximal segmental level. No evidence of acute pulmonary embolus. Three vessel arch with variant anatomy. The right brachiocephalic and left common carotid artery share a common origin while the left vertebral artery arises directly from the aorta. No evidence of aneurysm. Left subclavian approach cardiac rhythm maintenance device. Leads terminate in the right atrium, right ventricular apex and within a cardiac vein overlying the left ventricle. The heart is at the upper limits of normal for size. No pericardial effusion. Mediastinum/Nodes: Unremarkable CT appearance of the thyroid gland. No suspicious mediastinal or hilar adenopathy. No soft tissue mediastinal mass. The thoracic esophagus is unremarkable. Lungs/Pleura: Bilateral lower lobe atelectasis. No pulmonary edema or pleural effusion. Upper Abdomen: Calcified stones within the gallbladder neck. No gallbladder distension. No biliary ductal abnormality. Musculoskeletal: No acute fracture or aggressive appearing lytic or blastic osseous  lesion. Review of the MIP images confirms the above findings. IMPRESSION: 1. Negative for acute pulmonary embolus, pneumonia or other acute cardiopulmonary process. 2. Bilateral lower lobe atelectasis suggest hypoventilation. 3. Biventricular cardiac rhythm maintenance device in place. 4. Cholelithiasis. Electronically Signed   By: Jacqulynn Cadet M.D.   On: 02/05/2021 07:33   DG Chest Portable 1 View  Result Date: 02/05/2021 CLINICAL DATA:  Cough EXAM: PORTABLE CHEST 1 VIEW COMPARISON:  Prior chest x-ray 10/14/2019 FINDINGS: Left subclavian approach biventricular cardiac rhythm maintenance device. Leads project over the right atrium, right ventricle and overlying the left ventricle. Mild cardiomegaly. Extremely low inspiratory volumes with bibasilar airspace opacities. Linear opacity overlying the right lung base along the minor fissure suggests right middle lobe atelectasis. Central and bilateral lower lobes the central airway thickening and peribronchial cuffing. No large effusion or pneumothorax. IMPRESSION: 1. Very low inspiratory volumes with bibasilar airspace opacities likely reflecting atelectasis. Superimposed infiltrate/pneumonia would be difficult to exclude, particularly on the left. 2. Right middle lobe atelectasis. 3. Biventricular cardiac rhythm maintenance device. Electronically Signed   By: Jacqulynn Cadet M.D.   On: 02/05/2021 05:46      ASSESSMENT/PLAN   Acute hypoxemic respiratory failure - present on admission  - COVID19 -negative   - supplemental O2 during my evaluation -3L/min  - will perform infectious workup for pneumonia -Respiratory viral panel -serum fungitell -legionella ab -strep pneumoniae ur AG -Histoplasma Ur Ag -sputum resp cultures -AFB sputum expectorated specimen -sputum cytology  -reviewed pertinent imaging with patient today - ESR -PT/OT for d/c planning  -please encourage patient to use incentive spirometer few times each hour while  hospitalized.     Chronic cardiomyopathy s/p PPM AICD  Previously seen by [facility]  -repeat TTE -acute on chronic heart failure?  -BNP is >100 with obese body habitus   Bibasilar atelectasis  - Metaneb with albuterol Q8h   - mucomyst 51ml BID neb with RT    - IS and flutter at bedside   - encourage to participate in bronchopulmonary hygiene  OSA on cpap   - QHS CPAP while in house    - spirometry with graph due to muscular dystrophy - patient may need BIPAP long term    Thank you for allowing me to participate in the care of this patient.  Total face to face encounter time for this patient visit was >45 min. >50% of the time was  spent in counseling and coordination of care.   Patient/Family are satisfied with care plan and all questions have been answered.  This document was prepared using Systems analyst and may  include unintentional dictation errors.     Ottie Glazier, M.D.  Division of [place]

## 2021-02-05 NOTE — ED Triage Notes (Signed)
Pt to triage via w/c with no distress noted; N/V last wk, had episode "like he was choking and couldn't get it up", since has had nonprod cough and SHOB; hx "swallowing issues"; pt denies any pain at present, denies any fever; wife performed home COVID test that was neg PTA

## 2021-02-05 NOTE — Evaluation (Addendum)
Clinical/Bedside Swallow Evaluation Patient Details  Name: James [name] MRN: [id number] Date of Birth: [birth year]  Today's Date: 02/05/2021 Time: SLP Start Time (ACUTE ONLY): 54 SLP Stop Time (ACUTE ONLY): 1130 SLP Time Calculation (min) (ACUTE ONLY): 60 min  Past Medical History:  Past Medical History:  Diagnosis Date   Bronchitis    Cardiomyopathy ([place])    Dysphagia    GERD (gastroesophageal reflux disease)    History of chicken pox    History of pneumonia 2013   double PNA at [facility]   LBBB (left bundle branch block)    Myotonic dystrophy, type 1 ([place])    followed by [facility]   Pneumonia    aspiration   Positive colorectal cancer screening using Cologuard test 03/31/2017   Sleep apnea    Past Surgical History:  Past Surgical History:  Procedure Laterality Date   CARDIAC DEFIBRILLATOR PLACEMENT     defib/pacer   CARDIAC DEFIBRILLATOR PLACEMENT     COLONOSCOPY N/A 07/01/2020   Procedure: COLONOSCOPY;  Surgeon: Virgel Manifold, MD;  Location: [facility] ENDOSCOPY;  Service: Endoscopy;  Laterality: N/A;   COLONOSCOPY WITH PROPOFOL N/A 08/16/2016   Procedure: COLONOSCOPY WITH PROPOFOL;  Surgeon: Jonathon Bellows, MD;  Location: [facility] ENDOSCOPY;  Service: Gastroenterology;  Laterality: N/A;   COLONOSCOPY WITH PROPOFOL N/A 04/04/2017   Procedure: COLONOSCOPY WITH PROPOFOL;  Surgeon: Jonathon Bellows, MD;  Location: [facility] ENDOSCOPY;  Service: Gastroenterology;  Laterality: N/A;   PACEMAKER IMPLANT     US ECHOCARDIOGRAPHY  09/2010   EF 50%, mld LVH and dysfunction, mild RV dysfunction and enlarged   HPI:  Pt is a 55 y.o. male with medical history significant for muscular dystrophy, mild dysphagia on a Regular diet at home w/ meats cut, GERD, obesity, sleep apnea, cardiomyopathy status post ICD placement who presents to the ER for evaluation of shortness of breath.  Patient's wife states that 2 days prior to his admission, he developed nausea and had Multiple episodes of Emesis.  She said he usually has  digestive symptoms and this is  not unusual for him.  He also had a choking spell during the Emesis and she was afraid he may have aspirated.  For the last 2 days he has had a cough that is nonproductive associated with shortness of breath and hoarseness.  He denies having any fever or chills and denies having any chest pain.  She checked his pulse oximetry on room air at rest and it was in the 70s so she brought him to the ER for further evaluation.  Patient does not wear home oxygen at baseline but has been discharged home on oxygen in the past after treatment for pneumonia.  CT angiogram of the chest is negative for acute pulmonary embolus, pneumonia or other acute cardiopulmonary process.  Bilateral lower lobe atelectasis suggest hypoventilation.Biventricular cardiac rhythm maintenance device in place.  Pt admitted for Acute on chronic hypoxemic respiratory failure in context of Muscular Dystrophy.    Assessment / Plan / Recommendation  Clinical Impression  Pt seen today for BSE after admit to the hospital. Patient's wife states that 2 days prior to his admission, he developed nausea and had Multiple episodes of Emesis.  She said he usually has digestive symptoms and this is  not unusual for him.  He also had a choking spell during the Emesis and she was afraid he may have aspirated. Pt and Wife deny any general swallowing problems as long as he is following general strategies of Small bites/sips and eating  small Cut, moistened foods secondary to Baseline Muscular Dystrophy. Previous MBSS at [facility] have revealed NO aspiration but possible pharyngeal residue after the swallow; pt uses strategy to clear this per Wife/pt report.  Pt appears to present w/ adequate oropharyngeal phase swallow function w/ No overt oropharyngeal phase dysphagia noted this evaluation when using a slightly modified diet -- moistened foods cut Small and general strategies. This has been pt's Baseline d/t chronic Muscular Dystrophy per  Wife/pt. Previous MBSS at [facility] revealed NO aspiration but potentially weak pharyngeal phase of swallowing resulting in pharyngeal residue -- he was told to alternate bites/sips to aid clearing. Pt consumed current po trials w/ No overt, clinical s/s of aspiration during po trials. Pt appears at reduced risk for aspiration following general aspiration precautions and strategies and continuing his moist, soft foods diet Cut Small in his diet.     During po trials, pt consumed all consistencies w/ no overt coughing, decline in vocal quality, or change in respiratory presentation during/post trials. Oral phase appeared [facility] w/ timely bolus management, mastication, and control of bolus propulsion for A-P transfer for swallowing following general strategies (baseline for him). Oral clearing achieved w/ all trial consistencies. Pt was instructed on using the Lingual Sweep and f/u Dry swallow strategies to aid pharyngeal clearing w/ food boluses. Pt also continued to alternate foods/liquids to aid clearing. OM Exam appeared [facility] w/ no unilateral weakness noted. Strong (5) lingual strength for protrusion/posteriorly. Speech Clear. Pt fed self w/ setup support.   Recommend a more Mech Soft consistency diet w/ well-Cut meats, moistened foods; Thin liquids VIA CUP - pt does Not use straws at Baseline. Recommend general aspiration precautions, strategies to include: using the Lingual Sweep and f/u Dry swallow strategies to aid pharyngeal clearing w/ food boluses, and alternating foods/liquids to aid clearing. Pills WHOLE in Puree for safer, easier swallowing. Education given on Pills in Puree; food consistencies and easy to eat options; food prep; practice w/ strategies and general aspiration precautions. GERD precautions baseline. NSG to reconsult if any new needs arise. NSG agreed. MD updated. Pt/Wife agreed. Menu provided. Addendum: also provided pt w/ an Incentive Spirometer for use to improve Pulmonary effort and health  to aid swallowing.   SLP Visit Diagnosis: Dysphagia, unspecified (R13.10)    Aspiration Risk   (reduced following general precautions)    Diet Recommendation   Mech Soft consistency diet w/ well-Cut meats, moistened foods; Thin liquids VIA CUP - pt does Not use straws at Baseline. Recommend general aspiration precautions, strategies to include: using the Lingual Sweep and f/u Dry swallow strategies to aid pharyngeal clearing w/ food boluses, and alternating foods/liquids to aid clearing. GERD precautions.   Medication Administration: Whole meds with puree (for easier, safer swallowing d/t cohesiveness)    Other  Recommendations Recommended Consults:  (Dietician f/u) Oral Care Recommendations: Oral care BID;Oral care before and after PO;Patient independent with oral care Other Recommendations:  (n/a)    Recommendations for follow up therapy are one component of a multi-disciplinary discharge planning process, led by the attending physician.  Recommendations may be updated based on patient status, additional functional criteria and insurance authorization.  Follow up Recommendations No SLP follow up      Assistance Recommended at Discharge None  Functional Status Assessment Patient has had a recent decline in their functional status and demonstrates the ability to make significant improvements in function in a reasonable and predictable amount of time.  Frequency and Duration  (n/a)   (n/a)  Prognosis Prognosis for Safe Diet Advancement: Good Barriers to Reach Goals:  (Muscular Dystrophy baseline)      Swallow Study   General Date of Onset: 02/05/21 HPI: Pt is a 55 y.o. male with medical history significant for muscular dystrophy, mild dysphagia on a Regular diet at home w/ meats cut, GERD, obesity, sleep apnea, cardiomyopathy status post ICD placement who presents to the ER for evaluation of shortness of breath.  Patient's wife states that 2 days prior to his admission, he  developed nausea and had Multiple episodes of Emesis.  She said he usually has digestive symptoms and this is  not unusual for him.  He also had a choking spell during the Emesis and she was afraid he may have aspirated.  For the last 2 days he has had a cough that is nonproductive associated with shortness of breath and hoarseness.  He denies having any fever or chills and denies having any chest pain.  She checked his pulse oximetry on room air at rest and it was in the 70s so she brought him to the ER for further evaluation.  Patient does not wear home oxygen at baseline but has been discharged home on oxygen in the past after treatment for pneumonia.  CT angiogram of the chest is negative for acute pulmonary embolus, pneumonia or other acute cardiopulmonary process.  Bilateral lower lobe atelectasis suggest hypoventilation.Biventricular cardiac rhythm maintenance device in place.  Pt admitted for Acute on chronic hypoxemic respiratory failure in context of Muscular Dystrophy. Type of Study: Bedside Swallow Evaluation Previous Swallow Assessment: 03/2015 - d/cd on a mech soft diet.  Wife reported a MBSS on [facility] in the past ~3 years in which he was told to alternate his foods/liquids to aid pharyngeal clearing -- NO aspiration noted.  Pt has been eating a regular diet at home w/ Wife cutting meats/foods. Diet Prior to this Study: Dysphagia 3 (soft);Thin liquids (appears) Temperature Spikes Noted: No (wbc 8.9) Respiratory Status: Nasal cannula (2L) History of Recent Intubation: No Behavior/Cognition: Alert;Cooperative;Pleasant mood Oral Cavity Assessment: Within Functional Limits (min sticky) Oral Care Completed by SLP: Yes Oral Cavity - Dentition: Adequate natural dentition Vision: Functional for self-feeding Self-Feeding Abilities: Able to feed self;Needs assist;Needs set up Patient Positioning: Upright in bed (needed min support w/ positioning) Baseline Vocal Quality: Normal;Low vocal  intensity Volitional Cough:  (adequate) Volitional Swallow: Able to elicit    Oral/Motor/Sensory Function Overall Oral Motor/Sensory Function: Within functional limits   Ice Chips Ice chips: Within functional limits Presentation: Spoon (fed; 2 trials)   Thin Liquid Thin Liquid: Within functional limits Presentation: Cup;Self Fed (~4-5 ozs)    Nectar Thick Nectar Thick Liquid: Not tested   Honey Thick Honey Thick Liquid: Not tested   Puree Puree: Within functional limits Presentation: Spoon (fed; 4 trials)   Solid     Solid:  (adequate w/ mech soft, moist) Presentation: Spoon (fed; 5 trials) Other Comments: boluses cut small        Orinda Kenner, MS, [employer] Speech Language Pathologist Rehab Services [phone] Tarshia Kot 02/05/2021,3:10 PM

## 2021-02-05 NOTE — ED Provider Notes (Addendum)
[facility] Emergency Department Provider Note  ____________________________________________   Event Date/Time   First MD Initiated Contact with Patient 02/05/21 [phone]     (approximate)  I have reviewed the triage vital signs and the nursing notes.   HISTORY  Chief Complaint Shortness of Breath    HPI James [name] is a 55 y.o. male with muscular dystrophy, ICD, pacemaker due to cardiomyopathy from this he comes in with shortness of breath.  Wife is at bedside who states that he had a little bit of nausea and vomiting on Wednesday.  He looks like he was choking he could not get up and there was a chance he might have aspirated.  Since then he has had a nonproductive cough and some shortness of breath.  She reports that he has a history of pneumonia in the past when he is required oxygen but he is not typically on oxygen at baseline.  She noted that his oxygen levels were in the 70s so they brought him here to be evaluated.  The only risk factor for PE is that patient is pretty nonambulatory due to his muscular dystrophy            Past Medical History:  Diagnosis Date   Bronchitis    Cardiomyopathy ([place])    Dysphagia    GERD (gastroesophageal reflux disease)    History of chicken pox    History of pneumonia 2013   double PNA at [facility]   LBBB (left bundle branch block)    Myotonic dystrophy, type 1 ([place])    followed by [facility]   Pneumonia    aspiration   Positive colorectal cancer screening using Cologuard test 03/31/2017   Sleep apnea     Patient Active Problem List   Diagnosis Date Noted   Hypokalemia 07/06/2020   Polyp of colon    Colitis 06/29/2020   Chronic cough 11/22/2019   Numbness 02/16/2018   Constipation 02/16/2018   Avascular necrosis of hip, unspecified laterality ([place]) 11/08/2017   Fall 11/08/2017   ICD (implantable cardioverter-defibrillator) in place 09/23/2016   Prediabetes 12/01/2015   Heart palpitations 09/25/2015    Obstructive sleep apnea 06/26/2015   Rotator cuff tendinitis 05/22/2015   Dysphagia    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   Male hypogonadism 06/01/2013   Cardiomyopathy ([place])    Left bundle branch block    Myotonic dystrophy, type 1 ([place])     Past Surgical History:  Procedure Laterality Date   CARDIAC DEFIBRILLATOR PLACEMENT     defib/pacer   CARDIAC DEFIBRILLATOR PLACEMENT     COLONOSCOPY N/A 07/01/2020   Procedure: COLONOSCOPY;  Surgeon: Virgel Manifold, MD;  Location: [facility] ENDOSCOPY;  Service: Endoscopy;  Laterality: N/A;   COLONOSCOPY WITH PROPOFOL N/A 08/16/2016   Procedure: COLONOSCOPY WITH PROPOFOL;  Surgeon: Jonathon Bellows, MD;  Location: [facility] ENDOSCOPY;  Service: Gastroenterology;  Laterality: N/A;   COLONOSCOPY WITH PROPOFOL N/A 04/04/2017   Procedure: COLONOSCOPY WITH PROPOFOL;  Surgeon: Jonathon Bellows, MD;  Location: [facility] ENDOSCOPY;  Service: Gastroenterology;  Laterality: N/A;   PACEMAKER IMPLANT     US ECHOCARDIOGRAPHY  09/2010   EF 50%, mld LVH and dysfunction, mild RV dysfunction and enlarged    Prior to Admission medications   Medication Sig Start Date End Date Taking? Authorizing Provider  acetaminophen (TYLENOL) 500 MG tablet Take 500-1,000 mg by mouth every 6 (six) hours as needed for moderate pain or headache.  [provider]  albuterol (PROVENTIL) (2.5 MG/3ML) 0.083% nebulizer solution Take 3 mLs (2.5 mg total) by nebulization every 6 (six) hours as needed for wheezing or shortness of breath. 03/31/17   Leone Haven, MD  docusate sodium (COLACE) 100 MG capsule Take 100 mg by mouth daily as needed for mild constipation.    [provider]  Multiple Vitamin (MULTIVITAMIN) tablet Take 1 tablet by mouth daily.    [provider]  Ondansetron HCl (ZOFRAN PO) Take 1 tablet by mouth every 8 (eight) hours as needed (nausea/vomiting).    [provider]     Allergies Codeine  Family History  Problem Relation Age of Onset   Hypertension Father    Cancer Paternal Grandfather        lung, smoker   Cancer Paternal Uncle        bone   Diabetes Mother    CAD Mother 78       MI   Stroke Neg Hx     Social History Social History   Tobacco Use   Smoking status: Never   Smokeless tobacco: Never  Vaping Use   Vaping Use: Never used  Substance Use Topics   Alcohol use: No   Drug use: No      Review of Systems Constitutional: No fever/chills Eyes: No visual changes. ENT: No sore throat. Cardiovascular: No chest pain Respiratory: Positive for SOB, cough Gastrointestinal: No abdominal pain.  No nausea, no vomiting.  No diarrhea.  No constipation. Genitourinary: Negative for dysuria. Musculoskeletal: Negative for back pain. Skin: Negative for rash. Neurological: Negative for headaches, focal weakness or numbness. All other ROS negative ____________________________________________   PHYSICAL EXAM:  VITAL SIGNS: ED Triage Vitals  Enc Vitals Group     BP 02/05/21 0522 131/74     Pulse Rate 02/05/21 0522 88     Resp 02/05/21 0522 (!) 22     Temp 02/05/21 0522 98.4 F (36.9 C)     Temp Source 02/05/21 0522 Oral     SpO2 02/05/21 0522 (!) 83 %     Weight 02/05/21 0517 201 lb (91.2 kg)     Height 02/05/21 0517 5\' 11"  (1.803 m)     Head Circumference --      Peak Flow --      Pain Score 02/05/21 0517 0     Pain Loc --      Pain Edu? --      Excl. in [place]? --     Constitutional: Alert and oriented. Well appearing and in no acute distress. Eyes: Conjunctivae are normal. EOMI. Head: Atraumatic. Nose: No congestion/rhinnorhea. Mouth/Throat: Mucous membranes are moist.   Neck: No stridor. Trachea Midline. FROM Cardiovascular: Normal rate, regular rhythm. Grossly normal heart sounds.  Good peripheral circulation. Respiratory: On 2 L of oxygen with poor air movement bilaterally Gastrointestinal: Soft and nontender. No  distention. No abdominal bruits.  Musculoskeletal: No lower extremity tenderness nor edema.  No joint effusions. Neurologic:  Normal speech and language. No gross focal neurologic deficits are appreciated.  Skin:  Skin is warm, dry and intact. No rash noted. Psychiatric: Mood and affect are normal. Speech and behavior are normal. GU: Deferred   ____________________________________________   LABS (all labs ordered are listed, but only abnormal results are displayed)  Labs Reviewed  COMPREHENSIVE METABOLIC PANEL - Abnormal; Notable for the following components:      Result Value   Potassium 3.4 (*)    Glucose, Bld 187 (*)  Calcium 8.3 (*)    AST 47 (*)    ALT 59 (*)    Total Bilirubin 2.4 (*)    All other components within normal limits  BRAIN NATRIURETIC PEPTIDE - Abnormal; Notable for the following components:   B Natriuretic Peptide 104.6 (*)    All other components within normal limits  RESP PANEL BY RT-PCR (FLU A&B, COVID) ARPGX2  CULTURE, BLOOD (ROUTINE X 2)  CULTURE, BLOOD (ROUTINE X 2)  CBC WITH DIFFERENTIAL/PLATELET  LIPASE, BLOOD  PROCALCITONIN  LACTIC ACID, PLASMA  LACTIC ACID, PLASMA  TROPONIN I (HIGH SENSITIVITY)   ____________________________________________   ED ECG REPORT I, Vanessa [place], the attending physician, personally viewed and interpreted this ECG.  Sinus with left bundle branch block negative scar [place] no evidence of STEMI ____________________________________________  RADIOLOGY Robert Bellow, personally viewed and evaluated these images (plain radiographs) as part of my medical decision making, as well as reviewing the written report by the radiologist.  ED MD interpretation: Poor lung volumes  Official radiology report(s): DG Chest Portable 1 View  Result Date: 02/05/2021 CLINICAL DATA:  Cough EXAM: PORTABLE CHEST 1 VIEW COMPARISON:  Prior chest x-ray 10/14/2019 FINDINGS: Left subclavian approach biventricular cardiac rhythm maintenance  device. Leads project over the right atrium, right ventricle and overlying the left ventricle. Mild cardiomegaly. Extremely low inspiratory volumes with bibasilar airspace opacities. Linear opacity overlying the right lung base along the minor fissure suggests right middle lobe atelectasis. Central and bilateral lower lobes the central airway thickening and peribronchial cuffing. No large effusion or pneumothorax. IMPRESSION: 1. Very low inspiratory volumes with bibasilar airspace opacities likely reflecting atelectasis. Superimposed infiltrate/pneumonia would be difficult to exclude, particularly on the left. 2. Right middle lobe atelectasis. 3. Biventricular cardiac rhythm maintenance device. Electronically Signed   By: Jacqulynn Cadet M.D.   On: 02/05/2021 05:46    ____________________________________________   PROCEDURES  Procedure(s) performed (including Critical Care):  .1-3 Lead EKG Interpretation Performed by: Vanessa [place], MD Authorized by: Vanessa [place], MD     Interpretation: normal     ECG rate:  90s   ECG rate assessment: normal     Rhythm: sinus rhythm     Ectopy: none     Conduction: normal   .Critical Care Performed by: Vanessa [place], MD Authorized by: Vanessa [place], MD   Critical care provider statement:    Critical care time (minutes):  35   Critical care was necessary to treat or prevent imminent or life-threatening deterioration of the following conditions:  Respiratory failure   Critical care was time spent personally by me on the following activities:  Blood draw for specimens, development of treatment plan with patient or surrogate, evaluation of patient's response to treatment, examination of patient, obtaining history from patient or surrogate, ordering and performing treatments and interventions, pulse oximetry, re-evaluation of patient's condition, ordering and review of radiographic studies and ordering and review of laboratory studies   Care discussed with:  admitting provider     ____________________________________________   INITIAL IMPRESSION / [place] / ED COURSE   ARLIS YALE was evaluated in Emergency Department on 02/05/2021 for the symptoms described in the history of present illness. He was evaluated in the context of the global COVID-19 pandemic, which necessitated consideration that the patient might be at risk for infection with the SARS-CoV-2 virus that causes COVID-19. Institutional protocols and algorithms that pertain to the evaluation of patients at risk for COVID-19 are in a  state of rapid change based on information released by regulatory bodies including the CDC and federal and state organizations. These policies and algorithms were followed during the patient's care in the ED.     Pt presents with SOB.  I suspect this is most likely related to aspiration pneumonia given patient's history.  On my examination patient has poor breath sounds bilaterally.  No history of COPD I suspect it is just from his muscular dystrophy we can trial a DuoNeb to see if that helps open up at all   differential includes: PNA-will get xray to evaluation Anemia-CBC to evaluate ACS- will get trops Arrhythmia-Will get EKG and keep on monitor.  COVID- will get testing per algorithm. PE-consider given not very mobile Given history of cardiomyopathy will also get a BNP  Chest x-ray shows very poor lung volumes so difficult to tell if he has a pneumonia.  However given his symptoms and history I really suspect the patient most likely aspirated.  We will start him on some Unasyn.  Labs otherwise are reassuring with negative troponin.  White count is reassuring.  LFTs are slightly elevated which has had previously.  He had ultrasounds that showed gallstones but he would not be a good surgical candidate unless absolutely necessary.  Wife states that he has done very poorly with sedation previously.  At this time he has no right upper quadrant  tenderness and he is afebrile so low suspicion for cholecystitis we will hold off on ultrasound.  We will discussed with the hospital team for admission for new oxygen requirement.  Added on CT scan to make sure there is no evidence of PE versus pneumonia given chest x-ray was not very revealing        ____________________________________________   FINAL CLINICAL IMPRESSION(S) / ED DIAGNOSES   Final diagnoses:  Acute respiratory failure with hypoxia (HCC)  Abnormal LFTs     MEDICATIONS GIVEN DURING THIS VISIT:  Medications  Ampicillin-Sulbactam (UNASYN) 3 g in sodium chloride 0.9 % 100 mL IVPB (has no administration in time range)  ipratropium-albuterol (DUONEB) 0.5-2.5 (3) MG/3ML nebulizer solution 3 mL (has no administration in time range)  potassium chloride SA (KLOR-CON) CR tablet 40 mEq (has no administration in time range)  acetaminophen (TYLENOL) tablet 650 mg (650 mg Oral Given 02/05/21 4818)     ED Discharge Orders     None        Note:  This document was prepared using Dragon voice recognition software and may include unintentional dictation errors.   Vanessa [place], MD 02/05/21 5631    Vanessa [place]arr [place], MD 02/05/21 [phone]

## 2021-02-05 NOTE — H&P (Signed)
History and Physical    James [name] YNW:[id number] DOB: [birth year] DOA: 02/05/2021  PCP: Leone Haven, MD   Patient coming from: Home  I have personally briefly reviewed patient's old medical records in [place]  Chief Complaint: Shortness of breath  Most of the history was obtained from patient's wife at the bedside.  HPI: James [name] is a 55 y.o. male with medical history significant for muscular dystrophy, dysphagia, GERD, sleep apnea, cardiomyopathy status post ICD placement who presents to the ER for evaluation of shortness of breath. Patient's wife states that 2 days prior to his admission he developed nausea and had multiple episodes of emesis.  She said he usually has digestive symptoms and this is not unusual for him.  He also had a choking spell which he normally gets and she was afraid he may have aspirated.  For the last 2 days he has had a cough that is nonproductive associated with shortness of breath and hoarseness.  He denies having any fever or chills and denies having any chest pain.  She checked his pulse oximetry on room air at rest and it was in the 70s so she brought him to the ER for further evaluation. Patient does not wear home oxygen at baseline but has been discharged home on oxygen in the past after treatment for pneumonia. He denies having any palpitations, no diaphoresis, no headache, no changes in his bowel habits, no dizziness, no lightheadedness, no abdominal pain, no leg swelling, no urinary symptoms, no leg swelling, no blurred vision no focal deficit. Labs show sodium 138, potassium 3.4, chloride 105, bicarb 27, glucose 187, BUN 9, creatinine 0.76, calcium 8.3, alkaline phosphatase 67, albumin 3.6, lipase 27, AST 47, ALT 59, total protein 7.1, BNP 104, troponin 8, lactic acid 1.3, procalcitonin less than 0.10, white count 8.9, hemoglobin 15.0, hematocrit 45.5, MCV 98.5, RDW 13.1, platelet count 173 Chest x-ray reviewed by me shows very low  inspiratory volumes with bibasilar airspace opacities likely reflecting atelectasis. Superimposed infiltrate/pneumonia would be difficult to exclude, particularly on the left. Right middle lobe atelectasis. Biventricular cardiac rhythm maintenance device. CT angiogram of the chest is negative for acute pulmonary embolus, pneumonia or other acute cardiopulmonary process. Bilateral lower lobe atelectasis suggest hypoventilation.Biventricular cardiac rhythm maintenance device in place. Cholelithiasis. Twelve-lead EKG reviewed by me shows sinus rhythm with a right bundle branch block.  ED Course: Patient is a 54 year old male with a history of muscular dystrophy who presents to the ER for evaluation of a 2-day history of worsening shortness of breath associated with a nonproductive cough and hoarseness. Prior to developing the symptoms he had a 1 day history of nausea with multiple episodes of emesis and a choking spell concerning for possible aspiration. He does not use home oxygen but his wife checked his pulse oximetry on room air at rest and he was in the 70s so she brought him to the ER for further evaluation. Patient was started on IV Unasyn and will be admitted to the hospital for further evaluation.  Review of Systems: As per HPI otherwise all other systems reviewed and negative.    Past Medical History:  Diagnosis Date   Bronchitis    Cardiomyopathy ([place])    Dysphagia    GERD (gastroesophageal reflux disease)    History of chicken pox    History of pneumonia 2013   double PNA at [facility]   LBBB (left bundle branch block)    Myotonic dystrophy, type 1 ([place])  followed by [facility]   Pneumonia    aspiration   Positive colorectal cancer screening using Cologuard test 03/31/2017   Sleep apnea     Past Surgical History:  Procedure Laterality Date   CARDIAC DEFIBRILLATOR PLACEMENT     defib/pacer   CARDIAC DEFIBRILLATOR PLACEMENT     COLONOSCOPY N/A 07/01/2020   Procedure: COLONOSCOPY;   Surgeon: Virgel Manifold, MD;  Location: [facility] ENDOSCOPY;  Service: Endoscopy;  Laterality: N/A;   COLONOSCOPY WITH PROPOFOL N/A 08/16/2016   Procedure: COLONOSCOPY WITH PROPOFOL;  Surgeon: Jonathon Bellows, MD;  Location: [facility] ENDOSCOPY;  Service: Gastroenterology;  Laterality: N/A;   COLONOSCOPY WITH PROPOFOL N/A 04/04/2017   Procedure: COLONOSCOPY WITH PROPOFOL;  Surgeon: Jonathon Bellows, MD;  Location[facility] ENDOSCOPY;  Service: Gastroenterology;  Laterality: N/A;   PACEMAKER IMPLANT     US ECHOCARDIOGRAPHY  09/2010   EF 50%, mld LVH and dysfunction, mild RV dysfunction and enlarged     reports that he has never smoked. He has never used smokeless tobacco. He reports that he does not drink alcohol and does not use drugs.  Allergies  Allergen Reactions   Codeine Nausea Only    Family History  Problem Relation Age of Onset   Hypertension Father    Cancer Paternal Grandfather        lung, smoker   Cancer Paternal Uncle        bone   Diabetes Mother    CAD Mother 19       MI   Stroke Neg Hx      Prior to Admission medications   Medication Sig Start Date End Date Taking? Authorizing Provider  acetaminophen (TYLENOL) 500 MG tablet Take 500-1,000 mg by mouth every 6 (six) hours as needed for moderate pain or headache.    [provider]  albuterol (PROVENTIL) (2.5 MG/3ML) 0.083% nebulizer solution Take 3 mLs (2.5 mg total) by nebulization every 6 (six) hours as needed for wheezing or shortness of breath. 03/31/17   Leone Haven, MD  docusate sodium (COLACE) 100 MG capsule Take 100 mg by mouth daily as needed for mild constipation.    [provider]  Multiple Vitamin (MULTIVITAMIN) tablet Take 1 tablet by mouth daily.    [provider]  Ondansetron HCl (ZOFRAN PO) Take 1 tablet by mouth every 8 (eight) hours as needed (nausea/vomiting).    [provider]    Physical Exam: Vitals:   02/05/21 0523 02/05/21 0529 02/05/21 0612 02/05/21 0642  BP:   131/74 111/72 104/71  Pulse:  92 71 72  Resp:  (!) 21 (!) 22 19  Temp:      TempSrc:      SpO2: 92% 91% 93% 93%  Weight:      Height:         Vitals:   02/05/21 0523 02/05/21 0529 02/05/21 0612 02/05/21 0642  BP:  131/74 111/72 104/71  Pulse:  92 71 72  Resp:  (!) 21 (!) 22 19  Temp:      TempSrc:      SpO2: 92% 91% 93% 93%  Weight:      Height:          Constitutional: Alert and oriented x 3 . Not in any apparent distress.  Chronically ill-appearing HEENT:      Head: Normocephalic and atraumatic.         Eyes: PERLA, EOMI, Conjunctivae are normal. Sclera is non-icteric.       Mouth/Throat: Mucous membranes are moist.  Neck: Supple with no signs of meningismus. Cardiovascular: Regular rate and rhythm. No murmurs, gallops, or rubs. 2+ symmetrical distal pulses are present . No JVD. No LE edema Respiratory: Tachypnea.crackles in both lung bases bilaterally. No wheezes or rhonchi.  Gastrointestinal: Soft, non tender, and non distended with positive bowel sounds.  Central adiposity Genitourinary: No CVA tenderness. Musculoskeletal: Nontender with normal range of motion in all extremities. No cyanosis, or erythema of extremities. Neurologic:  Face is symmetric. Moving all extremities. No gross focal neurologic deficits.  Generalized weakness Skin: Skin is warm, dry.  No rash or ulcers Psychiatric: Mood and affect are normal    Labs on Admission: I have personally reviewed following labs and imaging studies  CBC: Recent Labs  Lab 02/05/21 0529  WBC 8.9  NEUTROABS 7.0  HGB 15.0  HCT 45.5  MCV 98.5  PLT 403   Basic Metabolic Panel: Recent Labs  Lab 02/05/21 0529  NA 138  K 3.4*  CL 105  CO2 27  GLUCOSE 187*  BUN 9  CREATININE 0.76  CALCIUM 8.3*   GFR: Estimated Creatinine Clearance: 120.6 mL/min (by C-G formula based on SCr of 0.76 mg/dL). Liver Function Tests: Recent Labs  Lab 02/05/21 0529  AST 47*  ALT 59*  ALKPHOS 67  BILITOT 2.4*  PROT  7.1  ALBUMIN 3.6   Recent Labs  Lab 02/05/21 0529  LIPASE 27   No results for input(s): AMMONIA in the last 168 hours. Coagulation Profile: No results for input(s): INR, PROTIME in the last 168 hours. Cardiac Enzymes: No results for input(s): CKTOTAL, CKMB, CKMBINDEX, TROPONINI in the last 168 hours. BNP (last 3 results) No results for input(s): PROBNP in the last 8760 hours. HbA1C: No results for input(s): HGBA1C in the last 72 hours. CBG: No results for input(s): GLUCAP in the last 168 hours. Lipid Profile: No results for input(s): CHOL, HDL, LDLCALC, TRIG, CHOLHDL, LDLDIRECT in the last 72 hours. Thyroid Function Tests: No results for input(s): TSH, T4TOTAL, FREET4, T3FREE, THYROIDAB in the last 72 hours. Anemia Panel: No results for input(s): VITAMINB12, FOLATE, FERRITIN, TIBC, IRON, RETICCTPCT in the last 72 hours. Urine analysis:    Component Value Date/Time   COLORURINE YELLOW (A) 06/29/2020 1603   APPEARANCEUR CLEAR (A) 06/29/2020 1603   APPEARANCEUR Clear 01/23/2014 1256   LABSPEC >1.046 (H) 06/29/2020 1603   LABSPEC 1.006 01/23/2014 1256   PHURINE 6.0 06/29/2020 1603   GLUCOSEU NEGATIVE 06/29/2020 1603   GLUCOSEU Negative 01/23/2014 1256   HGBUR NEGATIVE 06/29/2020 1603   BILIRUBINUR NEGATIVE 06/29/2020 1603   BILIRUBINUR Negative 01/23/2014 1256   KETONESUR NEGATIVE 06/29/2020 1603   PROTEINUR NEGATIVE 06/29/2020 1603   NITRITE NEGATIVE 06/29/2020 1603   LEUKOCYTESUR NEGATIVE 06/29/2020 1603   LEUKOCYTESUR Negative 01/23/2014 1256    Radiological Exams on Admission: CT Angio Chest PE W and/or Wo Contrast  Result Date: 02/05/2021 CLINICAL DATA:  High probability suspicion for pulmonary embolus EXAM: CT ANGIOGRAPHY CHEST WITH CONTRAST TECHNIQUE: Multidetector CT imaging of the chest was performed using the standard protocol during bolus administration of intravenous contrast. Multiplanar CT image reconstructions and MIPs were obtained to evaluate the  vascular anatomy. CONTRAST:  42mL OMNIPAQUE IOHEXOL 350 MG/ML SOLN COMPARISON:  Prior CT scan of the chest 10/06/2013 FINDINGS: Cardiovascular: Adequate opacification of the pulmonary arteries to the proximal segmental level. No evidence of acute pulmonary embolus. Three vessel arch with variant anatomy. The right brachiocephalic and left common carotid artery share a common origin while the left vertebral artery arises  directly from the aorta. No evidence of aneurysm. Left subclavian approach cardiac rhythm maintenance device. Leads terminate in the right atrium, right ventricular apex and within a cardiac vein overlying the left ventricle. The heart is at the upper limits of normal for size. No pericardial effusion. Mediastinum/Nodes: Unremarkable CT appearance of the thyroid gland. No suspicious mediastinal or hilar adenopathy. No soft tissue mediastinal mass. The thoracic esophagus is unremarkable. Lungs/Pleura: Bilateral lower lobe atelectasis. No pulmonary edema or pleural effusion. Upper Abdomen: Calcified stones within the gallbladder neck. No gallbladder distension. No biliary ductal abnormality. Musculoskeletal: No acute fracture or aggressive appearing lytic or blastic osseous lesion. Review of the MIP images confirms the above findings. IMPRESSION: 1. Negative for acute pulmonary embolus, pneumonia or other acute cardiopulmonary process. 2. Bilateral lower lobe atelectasis suggest hypoventilation. 3. Biventricular cardiac rhythm maintenance device in place. 4. Cholelithiasis. Electronically Signed   By: Jacqulynn Cadet M.D.   On: 02/05/2021 07:33   DG Chest Portable 1 View  Result Date: 02/05/2021 CLINICAL DATA:  Cough EXAM: PORTABLE CHEST 1 VIEW COMPARISON:  Prior chest x-ray 10/14/2019 FINDINGS: Left subclavian approach biventricular cardiac rhythm maintenance device. Leads project over the right atrium, right ventricle and overlying the left ventricle. Mild cardiomegaly. Extremely low  inspiratory volumes with bibasilar airspace opacities. Linear opacity overlying the right lung base along the minor fissure suggests right middle lobe atelectasis. Central and bilateral lower lobes the central airway thickening and peribronchial cuffing. No large effusion or pneumothorax. IMPRESSION: 1. Very low inspiratory volumes with bibasilar airspace opacities likely reflecting atelectasis. Superimposed infiltrate/pneumonia would be difficult to exclude, particularly on the left. 2. Right middle lobe atelectasis. 3. Biventricular cardiac rhythm maintenance device. Electronically Signed   By: Jacqulynn Cadet M.D.   On: 02/05/2021 05:46     Assessment/Plan Principal Problem:   Acute respiratory failure (HCC) Active Problems:   Cardiomyopathy ([place])   Myotonic dystrophy, type 1 ([place])   Dysphagia   Hypokalemia   Aspiration pneumonia (HCC)     Acute respiratory failure As evidenced by tachypnea, increased work of breathing and hypoxia with room air pulse oximetry in the 70s currently requiring oxygen supplementation at 2 L to maintain pulse oximetry of 94% Most likely secondary to underlying muscular dystrophy with concomitant aspiration pneumonia Patient will need to be assessed for home oxygen need prior to discharge     Aspiration pneumonia Patient has a history of muscular dystrophy and has pharyngoesophageal dysphagia secondary to same He has dysphagia both for solids and liquids We will keep patient n.p.o. Consult speech therapy for swallow function evaluation Place patient on Unasyn adjusted to renal function     Cardiomyopathy/history of paroxysmal atrial fibrillation Secondary to underlying muscular dystrophy Status post AICD placement   DVT prophylaxis: Lovenox  Code Status: DNR  Family Communication: Greater than 50% of time was spent discussing patient's condition and plan of care with him and his wife at the bedside.  All questions and concerns have been  addressed.  They verbalized understanding and agree with the plan.  CODE STATUS was discussed and he is a DO NOT RESUSCITATE. Disposition Plan: Back to previous home environment Consults called: Pulmonary/speech therapy Status:At the time of admission, it appears that the appropriate admission status for this patient is inpatient. This is judged to be reasonable and necessary to provide the required intensity of service to ensure the patient's safety given the presenting symptoms, physical exam findings, and initial radiographic and laboratory data in the context of their comorbid conditions.  Patient requires inpatient status due to high intensity of service, high risk for further deterioration and high frequency of surveillance required.     Collier Bullock MD [facility]     02/05/2021, 8:49 AM

## 2021-02-05 NOTE — Progress Notes (Signed)
Bedside spirometry completed with patient, report placed into patients chart.

## 2021-02-06 LAB — BASIC METABOLIC PANEL
Anion gap: 6 (ref 5–15)
BUN: 10 mg/dL (ref 6–20)
CO2: 30 mmol/L (ref 22–32)
Calcium: 8.5 mg/dL — ABNORMAL LOW (ref 8.9–10.3)
Chloride: 108 mmol/L (ref 98–111)
Creatinine, Ser: 0.73 mg/dL (ref 0.61–1.24)
GFR, Estimated: >60 mL/min (ref >60)
Glucose, Bld: 101 mg/dL — ABNORMAL HIGH (ref 70–99)
Potassium: 3.9 mmol/L (ref 3.5–5.1)
Sodium: 144 mmol/L (ref 135–145)

## 2021-02-06 LAB — RESPIRATORY PANEL BY PCR

## 2021-02-06 LAB — CBC
HCT: 43.9 % (ref 39.0–52.0)
Hemoglobin: 14.4 g/dL (ref 13.0–17.0)
MCH: 32.8 pg (ref 26.0–34.0)
MCHC: 32.8 g/dL (ref 30.0–36.0)
MCV: 100 fL (ref 80.0–100.0)
Platelets: 133 10*3/uL — ABNORMAL LOW (ref 150–400)
RBC: 4.39 MIL/uL (ref 4.22–5.81)
RDW: 13 % (ref 11.5–15.5)
WBC: 7.5 10*3/uL (ref 4.0–10.5)
nRBC: 0 % (ref 0.0–0.2)

## 2021-02-06 LAB — PROCALCITONIN: Procalcitonin: 0.33 ng/mL

## 2021-02-06 LAB — STREP PNEUMONIAE URINARY ANTIGEN: Strep Pneumo Urinary Antigen: NEGATIVE

## 2021-02-06 LAB — MRSA NEXT GEN BY PCR, NASAL: MRSA by PCR Next Gen: NOT DETECTED

## 2021-02-06 MED ORDER — IPRATROPIUM-ALBUTEROL 0.5-2.5 (3) MG/3ML IN SOLN
3.0000 mL | Freq: Three times a day (TID) | RESPIRATORY_TRACT | Status: DC
  Administered 2021-02-06 – 2021-02-09 (×10): 3 mL via RESPIRATORY_TRACT
  Filled 2021-02-06 (×10): qty 3

## 2021-02-06 NOTE — Progress Notes (Signed)
Patient has refused bipap for the night. Wife states patient not tolerating hospital unit.

## 2021-02-06 NOTE — Progress Notes (Signed)
PROGRESS NOTE    James [name]  VCB:[id number] DOB: 01/17/1966 DOA: 02/05/2021 PCP: Leone Haven, MD  Outpatient Specialists: neurology, cardiology, pulmonology, SLP, GI    Brief Narrative:   From admission h and p James [name] is a 55 y.o. male with medical history significant for muscular dystrophy, dysphagia, GERD, sleep apnea, cardiomyopathy status post ICD placement who presents to the ER for evaluation of shortness of breath. Patient's wife states that 2 days prior to his admission he developed nausea and had multiple episodes of emesis.  She said he usually has digestive symptoms and this is not unusual for him.  He also had a choking spell which he normally gets and she was afraid he may have aspirated.  For the last 2 days he has had a cough that is nonproductive associated with shortness of breath and hoarseness.  He denies having any fever or chills and denies having any chest pain.  She checked his pulse oximetry on room air at rest and it was in the 70s so she brought him to the ER for further evaluation. Patient does not wear home oxygen at baseline but has been discharged home on oxygen in the past after treatment for pneumonia. He denies having any palpitations, no diaphoresis, no headache, no changes in his bowel habits, no dizziness, no lightheadedness, no abdominal pain, no leg swelling, no urinary symptoms, no leg swelling, no blurred vision no focal deficit.   Assessment & Plan:   Principal Problem:   Acute respiratory failure (HCC) Active Problems:   Cardiomyopathy ([place])   Myotonic dystrophy, type 1 ([place])   Dysphagia   Hypokalemia   Aspiration pneumonia ([place])  # Aspiration pneumonitis # Acute hypoxic respiratory failure Hx dysphagia 2/2 myotonic dystrophy. Followed by GI and SLP as outpt. Had what appears to be aspiration event at home. CT relatively clear. O2 80s, requiring 2 L. Stable on that, no respiratory distress. Has been evaluated by speech here  and cleared to eat, his dysphagia seems to be at its baseline. - pulm following, appreciate recs - pneumonia studies pending - non-productive cough, no sputum has been obtained - continuing unasyn (started 11/11), likely transition to augmentin tomorrow if continues to make progress - wean o2 as able  # Cardiomyopathy S/p crt-d placement. Appears asymptomatic, euvolemic - monitor  # Myotonic dystrophy At baseline, followed closely by neurology  # OSA - bipap qhs  # Thrombocytopenia Mild, noted on today's labs - monitor, further w/u if persists    DVT prophylaxis: lovenox Code Status: dnr Family Communication: wife updated @ bedside 11/12  Level of care: Telemetry Medical Status is: Inpatient  Remains inpatient appropriate because: severity of illness        Consultants:  pulmonology  Procedures: none  Antimicrobials:  Unasyn 11/11>    Subjective: Reports feeling improved w/ more energy. Tolerating diet no further choking/aspiration. Breathing more comfortably. No pain.  Objective: Vitals:   02/05/21 2040 02/06/21 0301 02/06/21 0723 02/06/21 0734  BP:  114/74 100/64   Pulse:  91 60 63  Resp:  18 18 18   Temp:  98.5 F (36.9 C) 97.9 F (36.6 C)   TempSrc:  Oral Oral   SpO2: 92% 92% 94% 90%  Weight:      Height:        Intake/Output Summary (Last 24 hours) at 02/06/2021 0943 Last data filed at 02/06/2021 0306 Gross per 24 hour  Intake 726.52 ml  Output --  Net 726.52 ml  Filed Weights   02/05/21 0517  Weight: 91.2 kg    Examination:  General exam: Appears calm and comfortable  Respiratory system: rales at bases bilaterally Cardiovascular system: S1 & S2 heard, RRR. No JVD, murmurs, rubs, gallops or clicks. No pedal edema. Gastrointestinal system: Abdomen is nondistended, soft and nontender. No organomegaly or masses felt. Normal bowel sounds heard. Central nervous system: Alert and oriented. No focal neurological deficits. Extremities:  Symmetric 5 x 5 power. Skin: No rashes, lesions or ulcers Psychiatry: flat affect MSK: decreased tone    Data Reviewed: I have personally reviewed following labs and imaging studies  CBC: Recent Labs  Lab 02/05/21 0529 02/06/21 0404  WBC 8.9 7.5  NEUTROABS 7.0  --   HGB 15.0 14.4  HCT 45.5 43.9  MCV 98.5 100.0  PLT 173 778*   Basic Metabolic Panel: Recent Labs  Lab 02/05/21 0529 02/06/21 0404  NA 138 144  K 3.4* 3.9  CL 105 108  CO2 27 30  GLUCOSE 187* 101*  BUN 9 10  CREATININE 0.76 0.73  CALCIUM 8.3* 8.5*   GFR: Estimated Creatinine Clearance: 120.6 mL/min (by C-G formula based on SCr of 0.73 mg/dL). Liver Function Tests: Recent Labs  Lab 02/05/21 0529  AST 47*  ALT 59*  ALKPHOS 67  BILITOT 2.4*  PROT 7.1  ALBUMIN 3.6   Recent Labs  Lab 02/05/21 0529  LIPASE 27   No results for input(s): AMMONIA in the last 168 hours. Coagulation Profile: No results for input(s): INR, PROTIME in the last 168 hours. Cardiac Enzymes: No results for input(s): CKTOTAL, CKMB, CKMBINDEX, TROPONINI in the last 168 hours. BNP (last 3 results) No results for input(s): PROBNP in the last 8760 hours. HbA1C: No results for input(s): HGBA1C in the last 72 hours. CBG: No results for input(s): GLUCAP in the last 168 hours. Lipid Profile: No results for input(s): CHOL, HDL, LDLCALC, TRIG, CHOLHDL, LDLDIRECT in the last 72 hours. Thyroid Function Tests: No results for input(s): TSH, T4TOTAL, FREET4, T3FREE, THYROIDAB in the last 72 hours. Anemia Panel: No results for input(s): VITAMINB12, FOLATE, FERRITIN, TIBC, IRON, RETICCTPCT in the last 72 hours. Urine analysis:    Component Value Date/Time   COLORURINE YELLOW (A) 06/29/2020 1603   APPEARANCEUR CLEAR (A) 06/29/2020 1603   APPEARANCEUR Clear 01/23/2014 1256   LABSPEC >1.046 (H) 06/29/2020 1603   LABSPEC 1.006 01/23/2014 1256   PHURINE 6.0 06/29/2020 1603   GLUCOSEU NEGATIVE 06/29/2020 1603   GLUCOSEU Negative  01/23/2014 1256   HGBUR NEGATIVE 06/29/2020 1603   BILIRUBINUR NEGATIVE 06/29/2020 1603   BILIRUBINUR Negative 01/23/2014 1256   KETONESUR NEGATIVE 06/29/2020 1603   PROTEINUR NEGATIVE 06/29/2020 1603   NITRITE NEGATIVE 06/29/2020 1603   LEUKOCYTESUR NEGATIVE 06/29/2020 1603   LEUKOCYTESUR Negative 01/23/2014 1256   Sepsis Labs: @LABRCNTIP (procalcitonin:4,lacticidven:4)  ) Recent Results (from the past 240 hour(s))  Resp Panel by RT-PCR (Flu A&B, Covid) Nasopharyngeal Swab     Status: None   Collection Time: 02/05/21  5:27 AM   Specimen: Nasopharyngeal Swab; Nasopharyngeal(NP) swabs in vial transport medium  Result Value Ref Range Status   SARS Coronavirus 2 by RT PCR NEGATIVE NEGATIVE Final    Comment: (NOTE) SARS-CoV-2 target nucleic acids are NOT DETECTED.  The SARS-CoV-2 RNA is generally detectable in upper respiratory specimens during the acute phase of infection. The lowest concentration of SARS-CoV-2 viral copies this assay can detect is 138 copies/mL. A negative result does not preclude SARS-Cov-2 infection and should not be used as the  sole basis for treatment or other patient management decisions. A negative result may occur with  improper specimen collection/handling, submission of specimen other than nasopharyngeal swab, presence of viral mutation(s) within the areas targeted by this assay, and inadequate number of viral copies(<138 copies/mL). A negative result must be combined with clinical observations, patient history, and epidemiological information. The expected result is Negative.  Fact Sheet for Patients:  EntrepreneurPulse.com.au  Fact Sheet for Healthcare Providers:  IncredibleEmployment.be  This test is no t yet approved or cleared by the [place] FDA and  has been authorized for detection and/or diagnosis of SARS-CoV-2 by FDA under an Emergency Use Authorization (EUA). This EUA will remain  in effect (meaning  this test can be used) for the duration of the COVID-19 declaration under Section 564(b)(1) of the Act, 21 U.S.C.section 360bbb-3(b)(1), unless the authorization is terminated  or revoked sooner.       Influenza A by PCR NEGATIVE NEGATIVE Final   Influenza B by PCR NEGATIVE NEGATIVE Final    Comment: (NOTE) The Xpert Xpress SARS-CoV-2/FLU/RSV plus assay is intended as an aid in the diagnosis of influenza from Nasopharyngeal swab specimens and should not be used as a sole basis for treatment. Nasal washings and aspirates are unacceptable for Xpert Xpress SARS-CoV-2/FLU/RSV testing.  Fact Sheet for Patients: EntrepreneurPulse.com.au  Fact Sheet for Healthcare Providers: IncredibleEmployment.be  This test is not yet approved or cleared by the [place] FDA and has been authorized for detection and/or diagnosis of SARS-CoV-2 by FDA under an Emergency Use Authorization (EUA). This EUA will remain in effect (meaning this test can be used) for the duration of the COVID-19 declaration under Section 564(b)(1) of the Act, 21 U.S.C. section 360bbb-3(b)(1), unless the authorization is terminated or revoked.  Performed at [facility], [address] [place], [place] [address]   Blood culture (routine x 2)     Status: None (Preliminary result)   Collection Time: 02/05/21  6:00 AM   Specimen: BLOOD  Result Value Ref Range Status   Specimen Description BLOOD RIGHT HAND  Final   Special Requests   Final    BOTTLES DRAWN AEROBIC AND ANAEROBIC Blood Culture adequate volume   Culture   Final    NO GROWTH 1 DAY Performed at [facility], [address], [place], [place] [address]    Report Status PENDING  Incomplete  Blood culture (routine x 2)     Status: None (Preliminary result)   Collection Time: 02/05/21  6:05 AM   Specimen: BLOOD  Result Value Ref Range Status   Specimen Description BLOOD LEFT HAND  Final   Special  Requests   Final    BOTTLES DRAWN AEROBIC AND ANAEROBIC Blood Culture results may not be optimal due to an inadequate volume of blood received in culture bottles   Culture   Final    NO GROWTH 1 DAY Performed at [facility], [address] [place], [place] [address]    Report Status PENDING  Incomplete         Radiology Studies: CT Angio Chest PE W and/or Wo Contrast  Result Date: 02/05/2021 CLINICAL DATA:  High probability suspicion for pulmonary embolus EXAM: CT ANGIOGRAPHY CHEST WITH CONTRAST TECHNIQUE: Multidetector CT imaging of the chest was performed using the standard protocol during bolus administration of intravenous contrast. Multiplanar CT image reconstructions and MIPs were obtained to evaluate the vascular anatomy. CONTRAST:  35mL OMNIPAQUE IOHEXOL 350 MG/ML SOLN COMPARISON:  Prior CT scan of the  chest 10/06/2013 FINDINGS: Cardiovascular: Adequate opacification of the pulmonary arteries to the proximal segmental level. No evidence of acute pulmonary embolus. Three vessel arch with variant anatomy. The right brachiocephalic and left common carotid artery share a common origin while the left vertebral artery arises directly from the aorta. No evidence of aneurysm. Left subclavian approach cardiac rhythm maintenance device. Leads terminate in the right atrium, right ventricular apex and within a cardiac vein overlying the left ventricle. The heart is at the upper limits of normal for size. No pericardial effusion. Mediastinum/Nodes: Unremarkable CT appearance of the thyroid gland. No suspicious mediastinal or hilar adenopathy. No soft tissue mediastinal mass. The thoracic esophagus is unremarkable. Lungs/Pleura: Bilateral lower lobe atelectasis. No pulmonary edema or pleural effusion. Upper Abdomen: Calcified stones within the gallbladder neck. No gallbladder distension. No biliary ductal abnormality. Musculoskeletal: No acute fracture or aggressive appearing lytic or blastic  osseous lesion. Review of the MIP images confirms the above findings. IMPRESSION: 1. Negative for acute pulmonary embolus, pneumonia or other acute cardiopulmonary process. 2. Bilateral lower lobe atelectasis suggest hypoventilation. 3. Biventricular cardiac rhythm maintenance device in place. 4. Cholelithiasis. Electronically Signed   By: Jacqulynn Cadet M.D.   On: 02/05/2021 07:33   DG Chest Portable 1 View  Result Date: 02/05/2021 CLINICAL DATA:  Cough EXAM: PORTABLE CHEST 1 VIEW COMPARISON:  Prior chest x-ray 10/14/2019 FINDINGS: Left subclavian approach biventricular cardiac rhythm maintenance device. Leads project over the right atrium, right ventricle and overlying the left ventricle. Mild cardiomegaly. Extremely low inspiratory volumes with bibasilar airspace opacities. Linear opacity overlying the right lung base along the minor fissure suggests right middle lobe atelectasis. Central and bilateral lower lobes the central airway thickening and peribronchial cuffing. No large effusion or pneumothorax. IMPRESSION: 1. Very low inspiratory volumes with bibasilar airspace opacities likely reflecting atelectasis. Superimposed infiltrate/pneumonia would be difficult to exclude, particularly on the left. 2. Right middle lobe atelectasis. 3. Biventricular cardiac rhythm maintenance device. Electronically Signed   By: Jacqulynn Cadet M.D.   On: 02/05/2021 05:46        Scheduled Meds:  acetylcysteine  4 mL Nebulization BID   docusate sodium  100 mg Oral Daily   enoxaparin (LOVENOX) injection  40 mg Subcutaneous Q24H   multivitamin with minerals  1 tablet Oral Daily   sodium chloride flush  3 mL Intravenous Q12H   Continuous Infusions:  sodium chloride     ampicillin-sulbactam (UNASYN) IV 3 g (02/06/21 0809)     LOS: 1 day    Time spent: 31 min    Desma Maxim, MD [facility]   If 7PM-7AM, please contact night-coverage [email or web address] Password [facility] 02/06/2021, 9:43 AM

## 2021-02-06 NOTE — Progress Notes (Signed)
  Chaplain On-Call responded to Spiritual Care Consult Order .  Chaplain provided Advance Directives information for the patient and spouse.  Chaplain provided education about the documents, and described the process if the patient wishes to complete the documents while in the hospital.  Patient and spouse stated their understanding.  Chaplain Pollyann Samples M.Div., [facility]

## 2021-02-07 ENCOUNTER — Inpatient Hospital Stay: Payer: Medicare HMO

## 2021-02-07 LAB — BASIC METABOLIC PANEL
Anion gap: 4 — ABNORMAL LOW (ref 5–15)
BUN: 10 mg/dL (ref 6–20)
CO2: 31 mmol/L (ref 22–32)
Calcium: 8.4 mg/dL — ABNORMAL LOW (ref 8.9–10.3)
Chloride: 109 mmol/L (ref 98–111)
Creatinine, Ser: 0.73 mg/dL (ref 0.61–1.24)
GFR, Estimated: >60 mL/min (ref >60)
Glucose, Bld: 111 mg/dL — ABNORMAL HIGH (ref 70–99)
Potassium: 3.9 mmol/L (ref 3.5–5.1)
Sodium: 144 mmol/L (ref 135–145)

## 2021-02-07 LAB — CBC
HCT: 43.6 % (ref 39.0–52.0)
Hemoglobin: 14.3 g/dL (ref 13.0–17.0)
MCH: 32.4 pg (ref 26.0–34.0)
MCHC: 32.8 g/dL (ref 30.0–36.0)
MCV: 98.9 fL (ref 80.0–100.0)
Platelets: 179 10*3/uL (ref 150–400)
RBC: 4.41 MIL/uL (ref 4.22–5.81)
RDW: 12.8 % (ref 11.5–15.5)
WBC: 7.1 10*3/uL (ref 4.0–10.5)
nRBC: 0 % (ref 0.0–0.2)

## 2021-02-07 LAB — PROCALCITONIN: Procalcitonin: 0.1 ng/mL

## 2021-02-07 NOTE — Progress Notes (Signed)
Pulmonary Medicine          Date: 02/07/2021,   MRN# [id number] James [name] [birth year]     AdmissionWeight: 91.2 kg                 CurrentWeight: 91.2 kg   Referring physician: Dr Francine Graven   CHIEF COMPLAINT:   Acute on chronic hypoxemic respiratory failure in context of muscular dystrophy   HISTORY OF PRESENT ILLNESS   This is a pleasant 55yo M with hs of MD, GERD, OSA, Cardiomyopathy s/p ICD came in with SOB x2 d.  Wife of patient reported NV and signs of aspiration with regurgitation. She noted acute hypoxemia at home <80%. Labs show sodium 138, potassium 3.4, chloride 105, bicarb 27, glucose 187, BUN 9, creatinine 0.76, calcium 8.3, alkaline phosphatase 67, albumin 3.6, lipase 27, AST 47, ALT 59, total protein 7.1, BNP 104, troponin 8, lactic acid 1.3, procalcitonin less than 0.10, white count 8.9, hemoglobin 15.0, hematocrit 45.5, MCV 98.5, RDW 13.1, platelet count 173. CT angio was done and is negative for PE but + for bibasilar atelectasis and possible pneumonia with cardiomegaly.PCCM consultation for further evaluation and management.    02/07/21- patient s/p BIPAP overnight, repeat spirometry with current FEV1 at 0.8L 20% predicted. Repeat CXR today. BMP and CBC stable. Infectious workup negative thus fur, CRP substantially elevated indicative of possible inflammatory reaction to remote viral prdrome in lieu of low Procal.  Continue to work with RT for BPH via [place]. Patient is in no distress.  He is on mucomysit and is using it well but not expecotrating much debris.   PAST MEDICAL HISTORY   Past Medical History:  Diagnosis Date  . Bronchitis   . Cardiomyopathy ([place])   . Dysphagia   . GERD (gastroesophageal reflux disease)   . History of chicken pox   . History of pneumonia 2013   double PNA at [facility]  . LBBB (left bundle branch block)   . Myotonic dystrophy, type 1 ([place])    followed by [facility]  . Pneumonia    aspiration  . Positive colorectal cancer  screening using Cologuard test 03/31/2017  . Sleep apnea      SURGICAL HISTORY   Past Surgical History:  Procedure Laterality Date  . CARDIAC DEFIBRILLATOR PLACEMENT     defib/pacer  . CARDIAC DEFIBRILLATOR PLACEMENT    . COLONOSCOPY N/A 07/01/2020   Procedure: COLONOSCOPY;  Surgeon: Virgel Manifold, MD;  Location: [facility] ENDOSCOPY;  Service: Endoscopy;  Laterality: N/A;  . COLONOSCOPY WITH PROPOFOL N/A 08/16/2016   Procedure: COLONOSCOPY WITH PROPOFOL;  Surgeon: Jonathon Bellows, MD;  Location: [facility] ENDOSCOPY;  Service: Gastroenterology;  Laterality: N/A;  . COLONOSCOPY WITH PROPOFOL N/A 04/04/2017   Procedure: COLONOSCOPY WITH PROPOFOL;  Surgeon: Jonathon Bellows, MD;  Location: [facility] ENDOSCOPY;  Service: Gastroenterology;  Laterality: N/A;  . PACEMAKER IMPLANT    . US ECHOCARDIOGRAPHY  09/2010   EF 50%, mld LVH and dysfunction, mild RV dysfunction and enlarged     FAMILY HISTORY   Family History  Problem Relation Age of Onset  . Hypertension Father   . Cancer Paternal Grandfather        lung, smoker  . Cancer Paternal Uncle        bone  . Diabetes Mother   . CAD Mother 9       MI  . Stroke Neg Hx      SOCIAL HISTORY   Social History   Tobacco Use  .  Smoking status: Never  . Smokeless tobacco: Never  Vaping Use  . Vaping Use: Never used  Substance Use Topics  . Alcohol use: No  . Drug use: No     MEDICATIONS    Home Medication:    Current Medication:  Current Facility-Administered Medications:  .  0.9 %  sodium chloride infusion, 250 mL, Intravenous, PRN, Agbata, Tochukwu, MD .  acetaminophen (TYLENOL) tablet 500-1,000 mg, 500-1,000 mg, Oral, Q6H PRN, Agbata, Tochukwu, MD .  acetylcysteine (MUCOMYST) 20 % nebulizer / oral solution 4 mL, 4 mL, Nebulization, BID, Lanney Gins, Lainey Nelson, MD, 4 mL at 02/07/21 0721 .  albuterol (PROVENTIL) (2.5 MG/3ML) 0.083% nebulizer solution 2.5 mg, 2.5 mg, Nebulization, Q6H PRN, Agbata, Tochukwu, MD, 2.5 mg at 02/06/21 0734 .   Ampicillin-Sulbactam (UNASYN) 3 g in sodium chloride 0.9 % 100 mL IVPB, 3 g, Intravenous, Q6H, Dorothe Pea, RPH, Last Rate: 200 mL/hr at 02/07/21 0759, 3 g at 02/07/21 0759 .  docusate sodium (COLACE) capsule 100 mg, 100 mg, Oral, Daily, Agbata, Tochukwu, MD, 100 mg at 02/07/21 1034 .  enoxaparin (LOVENOX) injection 40 mg, 40 mg, Subcutaneous, Q24H, Agbata, Tochukwu, MD, 40 mg at 02/07/21 1034 .  ipratropium-albuterol (DUONEB) 0.5-2.5 (3) MG/3ML nebulizer solution 3 mL, 3 mL, Nebulization, TID, Wouk, Ailene Rud, MD, 3 mL at 02/07/21 0721 .  multivitamin with minerals tablet 1 tablet, 1 tablet, Oral, Daily, Agbata, Tochukwu, MD, 1 tablet at 02/07/21 1034 .  ondansetron (ZOFRAN) tablet 4 mg, 4 mg, Oral, Q6H PRN **OR** ondansetron (ZOFRAN) injection 4 mg, 4 mg, Intravenous, Q6H PRN, Agbata, Tochukwu, MD .  sodium chloride flush (NS) 0.9 % injection 3 mL, 3 mL, Intravenous, Q12H, Agbata, Tochukwu, MD, 3 mL at 02/07/21 1035 .  sodium chloride flush (NS) 0.9 % injection 3 mL, 3 mL, Intravenous, PRN, Agbata, Tochukwu, MD    ALLERGIES   Codeine     REVIEW OF SYSTEMS    Review of Systems:  Gen:  Denies  fever, sweats, chills weigh loss  HEENT: Denies blurred vision, double vision, ear pain, eye pain, hearing loss, nose bleeds, sore throat Cardiac:  No dizziness, chest pain or heaviness, chest tightness,edema Resp:   Denies cough or sputum porduction, shortness of breath,wheezing, hemoptysis,  Gi: Denies swallowing difficulty, stomach pain, nausea or vomiting, diarrhea, constipation, bowel incontinence Gu:  Denies bladder incontinence, burning urine Ext:   Denies Joint pain, stiffness or swelling Skin: Denies  skin rash, easy bruising or bleeding or hives Endoc:  Denies polyuria, polydipsia , polyphagia or weight change Psych:   Denies depression, insomnia or hallucinations   Other:  All other systems negative   VS: BP 113/73 (BP Location: Left Arm)   Pulse 65   Temp (!) 97.5 F  (36.4 C) (Oral)   Resp 18   Ht _0  (1.803 m)   Wt 91.2 kg   SpO2 94%   BMI 28.03 kg/m      PHYSICAL EXAM    GENERAL:NAD, no fevers, chills, no weakness no fatigue HEAD: Normocephalic, atraumatic.  EYES: Pupils equal, round, reactive to light. Extraocular muscles intact. No scleral icterus.  MOUTH: Moist mucosal membrane. Dentition intact. No abscess noted.  EAR, NOSE, THROAT: Clear without exudates. No external lesions.  NECK: Supple. No thyromegaly. No nodules. No JVD.  PULMONARY: Diffuse coarse rhonchi right sided +wheezes CARDIOVASCULAR: S1 and S2. Regular rate and rhythm. No murmurs, rubs, or gallops. No edema. Pedal pulses 2+ bilaterally.  GASTROINTESTINAL: Soft, nontender, nondistended. No masses. Positive bowel sounds. No  hepatosplenomegaly.  MUSCULOSKELETAL: No swelling, clubbing, or edema. Range of motion full in all extremities.  NEUROLOGIC: Cranial nerves II through XII are intact. No gross focal neurological deficits. Sensation intact. Reflexes intact.  SKIN: No ulceration, lesions, rashes, or cyanosis. Skin warm and dry. Turgor intact.  PSYCHIATRIC: Mood, affect within normal limits. The patient is awake, alert and oriented x 3. Insight, judgment intact.       IMAGING    CT Angio Chest PE W and/or Wo Contrast  Result Date: 02/05/2021 CLINICAL DATA:  High probability suspicion for pulmonary embolus EXAM: CT ANGIOGRAPHY CHEST WITH CONTRAST TECHNIQUE: Multidetector CT imaging of the chest was performed using the standard protocol during bolus administration of intravenous contrast. Multiplanar CT image reconstructions and MIPs were obtained to evaluate the vascular anatomy. CONTRAST:  29m OMNIPAQUE IOHEXOL 350 MG/ML SOLN COMPARISON:  Prior CT scan of the chest 10/06/2013 FINDINGS: Cardiovascular: Adequate opacification of the pulmonary arteries to the proximal segmental level. No evidence of acute pulmonary embolus. Three vessel arch with variant anatomy. The right  brachiocephalic and left common carotid artery share a common origin while the left vertebral artery arises directly from the aorta. No evidence of aneurysm. Left subclavian approach cardiac rhythm maintenance device. Leads terminate in the right atrium, right ventricular apex and within a cardiac vein overlying the left ventricle. The heart is at the upper limits of normal for size. No pericardial effusion. Mediastinum/Nodes: Unremarkable CT appearance of the thyroid gland. No suspicious mediastinal or hilar adenopathy. No soft tissue mediastinal mass. The thoracic esophagus is unremarkable. Lungs/Pleura: Bilateral lower lobe atelectasis. No pulmonary edema or pleural effusion. Upper Abdomen: Calcified stones within the gallbladder neck. No gallbladder distension. No biliary ductal abnormality. Musculoskeletal: No acute fracture or aggressive appearing lytic or blastic osseous lesion. Review of the MIP images confirms the above findings. IMPRESSION: 1. Negative for acute pulmonary embolus, pneumonia or other acute cardiopulmonary process. 2. Bilateral lower lobe atelectasis suggest hypoventilation. 3. Biventricular cardiac rhythm maintenance device in place. 4. Cholelithiasis. Electronically Signed   By: HJacqulynn CadetM.D.   On: 02/05/2021 07:33   DG Chest Portable 1 View  Result Date: 02/05/2021 CLINICAL DATA:  Cough EXAM: PORTABLE CHEST 1 VIEW COMPARISON:  Prior chest x-ray 10/14/2019 FINDINGS: Left subclavian approach biventricular cardiac rhythm maintenance device. Leads project over the right atrium, right ventricle and overlying the left ventricle. Mild cardiomegaly. Extremely low inspiratory volumes with bibasilar airspace opacities. Linear opacity overlying the right lung base along the minor fissure suggests right middle lobe atelectasis. Central and bilateral lower lobes the central airway thickening and peribronchial cuffing. No large effusion or pneumothorax. IMPRESSION: 1. Very low inspiratory  volumes with bibasilar airspace opacities likely reflecting atelectasis. Superimposed infiltrate/pneumonia would be difficult to exclude, particularly on the left. 2. Right middle lobe atelectasis. 3. Biventricular cardiac rhythm maintenance device. Electronically Signed   By: HJacqulynn CadetM.D.   On: 02/05/2021 05:46      ASSESSMENT/PLAN   Acute hypoxemic respiratory failure - present on admission  - COVID19 -negative   - supplemental O2 during my evaluation -3L/min  - will perform infectious workup for pneumonia -Respiratory viral panel -serum fungitell -legionella ab -strep pneumoniae ur AG -Histoplasma Ur Ag -sputum resp cultures -AFB sputum expectorated specimen -sputum cytology  -reviewed pertinent imaging with patient today - ESR -PT/OT for d/c planning  -please encourage patient to use incentive spirometer few times each hour while hospitalized.     Chronic cardiomyopathy s/p PPM AICD  Previously seen by [facility]  -  repeat TTE -acute on chronic heart failure?  -BNP is >100 with obese body habitus   Bibasilar atelectasis  - Metaneb with albuterol Q8h   - mucomyst 3m BID neb with RT    - IS and flutter at bedside   - encourage to participate in bronchopulmonary hygiene  OSA on cpap   - QHS CPAP while in house    - spirometry with graph due to muscular dystrophy - patient may need BIPAP long term    Thank you for allowing me to participate in the care of this patient.  Total face to face encounter time for this patient visit was >45 min. >50% of the time was  spent in counseling and coordination of care.   Patient/Family are satisfied with care plan and all questions have been answered.  This document was prepared using Dragon voice recognition software and may include unintentional dictation errors.     FOttie Glazier M.D.  Division of [place]

## 2021-02-07 NOTE — Progress Notes (Signed)
PROGRESS NOTE    James [name]  DQQ:[id number] DOB: 1965/08/09 DOA: 02/05/2021 PCP: Leone Haven, MD  Outpatient Specialists: neurology, cardiology, pulmonology, SLP, GI    Brief Narrative:   From admission h and p James [name] is a 55 y.o. male with medical history significant for muscular dystrophy, dysphagia, GERD, sleep apnea, cardiomyopathy status post ICD placement who presents to the ER for evaluation of shortness of breath. Patient's wife states that 2 days prior to his admission he developed nausea and had multiple episodes of emesis.  She said he usually has digestive symptoms and this is not unusual for him.  He also had a choking spell which he normally gets and she was afraid he may have aspirated.  For the last 2 days he has had a cough that is nonproductive associated with shortness of breath and hoarseness.  He denies having any fever or chills and denies having any chest pain.  She checked his pulse oximetry on room air at rest and it was in the 70s so she brought him to the ER for further evaluation. Patient does not wear home oxygen at baseline but has been discharged home on oxygen in the past after treatment for pneumonia. He denies having any palpitations, no diaphoresis, no headache, no changes in his bowel habits, no dizziness, no lightheadedness, no abdominal pain, no leg swelling, no urinary symptoms, no leg swelling, no blurred vision no focal deficit.   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 Followed by GI and SLP as outpt. Had what appears to be aspiration event at home. CT w/o infiltrate. O2 80s, requiring 2 L. Stable on that, no respiratory distress. Has been evaluated by speech here  and cleared to eat, his dysphagia seems to be at its baseline. Underlying myotonic dystrophy main underlying cause. - pulm following, appreciate recs - pneumonia studies pending - non-productive rare cough, no sputum has been obtained - continuing unasyn (started 11/11), will discuss utility of continuing w/ pulm - wean o2 as able  # Cardiomyopathy S/p crt-d placement. Appears asymptomatic, euvolemic  # Myotonic dystrophy At baseline, followed closely by neurology  # OSA - bipap qhs  # Thrombocytopenia Resolved w/ today's labs - monitor, further w/u if persists    DVT prophylaxis: lovenox Code Status: dnr Family Communication: wife updated @ bedside 11/13  Level of care: Telemetry Medical Status is: Inpatient  Remains inpatient appropriate because: severity of illness        Consultants:  pulmonology  Procedures: none  Antimicrobials:  Unasyn 11/11>    Subjective: Reports feeling improved w/ more energy. Tolerating diet no further choking/aspiration. Breathing more comfortably. No pain.  Objective: Vitals:   02/06/21 1939 02/07/21 0356 02/07/21 0726 02/07/21 0739  BP: 104/69 110/66  113/73  Pulse: 65 68  65  Resp: 20 20  18   Temp: 98.1 F (36.7 C) 99.1 F (37.3 C)  (!) 97.5 F (36.4 C)  TempSrc: Axillary Oral  Oral  SpO2: 96% 94% 91% 94%  Weight:      Height:       No intake or output data in the 24 hours ending 02/07/21 1209  Filed Weights   02/05/21 0517  Weight: 91.2 kg    Examination:  General exam: Appears calm and comfortable  Respiratory system: rales at bases bilaterally Cardiovascular system: S1 & S2 heard, RRR. No JVD, murmurs, rubs, gallops or clicks. No pedal edema. Gastrointestinal system: Abdomen is nondistended, soft and nontender. No organomegaly or masses felt. Normal bowel sounds heard. Central nervous system: Alert and oriented. No focal neurological deficits. Extremities: Symmetric 5 x 5 power. Skin: No rashes,  lesions or ulcers Psychiatry: flat affect MSK: decreased tone    Data Reviewed: I have personally reviewed following labs and imaging studies  CBC: Recent Labs  Lab 02/05/21 0529 02/06/21 0404 02/07/21 0413  WBC 8.9 7.5 7.1  NEUTROABS 7.0  --   --   HGB 15.0 14.4 14.3  HCT 45.5 43.9 43.6  MCV 98.5 100.0 98.9  PLT 173 133* 696   Basic Metabolic Panel: Recent Labs  Lab 02/05/21 0529 02/06/21 0404 02/07/21 0413  NA 138 144 144  K 3.4* 3.9 3.9  CL 105 108 109  CO2 27 30 31   GLUCOSE 187* 101* 111*  BUN 9 10 10   CREATININE 0.76 0.73 0.73  CALCIUM 8.3* 8.5* 8.4*   GFR: Estimated Creatinine Clearance: 120.6 mL/min (by C-G formula based on SCr of 0.73 mg/dL). Liver Function Tests: Recent Labs  Lab 02/05/21 0529  AST 47*  ALT 59*  ALKPHOS 67  BILITOT 2.4*  PROT 7.1  ALBUMIN 3.6   Recent Labs  Lab 02/05/21 0529  LIPASE 27   No results for input(s): AMMONIA in the last 168 hours. Coagulation Profile: No results for input(s): INR, PROTIME in the last 168 hours. Cardiac Enzymes: No results for input(s): CKTOTAL, CKMB, CKMBINDEX, TROPONINI in the last 168 hours. BNP (last 3 results) No results for input(s): PROBNP in the last 8760 hours. HbA1C: No results for input(s): HGBA1C in the last 72 hours. CBG: No results for input(s): GLUCAP in the last 168 hours. Lipid Profile: No results for input(s): CHOL, HDL, LDLCALC, TRIG, CHOLHDL, LDLDIRECT in the last 72 hours. Thyroid Function Tests: No results for input(s): TSH, T4TOTAL, FREET4, T3FREE, THYROIDAB in the last 72 hours. Anemia Panel: No results for input(s): VITAMINB12, FOLATE, FERRITIN, TIBC, IRON, RETICCTPCT in the last 72 hours. Urine analysis:    Component Value Date/Time   COLORURINE YELLOW (A) 06/29/2020 1603   APPEARANCEUR CLEAR (A) 06/29/2020 1603   APPEARANCEUR Clear 01/23/2014 1256   LABSPEC >1.046 (H) 06/29/2020 1603   LABSPEC 1.006 01/23/2014 1256   PHURINE 6.0 06/29/2020 1603   GLUCOSEU  NEGATIVE 06/29/2020 1603   GLUCOSEU Negative 01/23/2014 1256   HGBUR NEGATIVE 06/29/2020 1603   BILIRUBINUR NEGATIVE 06/29/2020 1603   BILIRUBINUR Negative 01/23/2014 1256   KETONESUR NEGATIVE 06/29/2020 1603   PROTEINUR NEGATIVE 06/29/2020 1603   NITRITE NEGATIVE 06/29/2020 1603   LEUKOCYTESUR NEGATIVE 06/29/2020 1603   LEUKOCYTESUR Negative 01/23/2014 1256   Sepsis Labs: @LABRCNTIP (procalcitonin:4,lacticidven:4)  ) Recent Results (from the past 240 hour(s))  Resp Panel by RT-PCR (Flu A&B, Covid) Nasopharyngeal Swab     Status: None   Collection Time: 02/05/21  5:27 AM   Specimen: Nasopharyngeal Swab; Nasopharyngeal(NP) swabs in vial transport medium  Result Value Ref Range Status   SARS Coronavirus 2 by RT PCR NEGATIVE NEGATIVE Final    Comment: (NOTE) SARS-CoV-2 target nucleic acids are NOT DETECTED.  The SARS-CoV-2 RNA is generally detectable in upper respiratory specimens during the acute phase of infection. The lowest concentration of SARS-CoV-2 viral copies this assay can detect is 138 copies/mL. A negative result does not preclude SARS-Cov-2 infection and should  not be used as the sole basis for treatment or other patient management decisions. A negative result may occur with  improper specimen collection/handling, submission of specimen other than nasopharyngeal swab, presence of viral mutation(s) within the areas targeted by this assay, and inadequate number of viral copies(<138 copies/mL). A negative result must be combined with clinical observations, patient history, and epidemiological information. The expected result is Negative.  Fact Sheet for Patients:  EntrepreneurPulse.com.au  Fact Sheet for Healthcare Providers:  IncredibleEmployment.be  This test is no t yet approved or cleared by the [place] FDA and  has been authorized for detection and/or diagnosis of SARS-CoV-2 by FDA under an Emergency Use Authorization  (EUA). This EUA will remain  in effect (meaning this test can be used) for the duration of the COVID-19 declaration under Section 564(b)(1) of the Act, 21 U.S.C.section 360bbb-3(b)(1), unless the authorization is terminated  or revoked sooner.       Influenza A by PCR NEGATIVE NEGATIVE Final   Influenza B by PCR NEGATIVE NEGATIVE Final    Comment: (NOTE) The Xpert Xpress SARS-CoV-2/FLU/RSV plus assay is intended as an aid in the diagnosis of influenza from Nasopharyngeal swab specimens and should not be used as a sole basis for treatment. Nasal washings and aspirates are unacceptable for Xpert Xpress SARS-CoV-2/FLU/RSV testing.  Fact Sheet for Patients: EntrepreneurPulse.com.au  Fact Sheet for Healthcare Providers: IncredibleEmployment.be  This test is not yet approved or cleared by the [place] FDA and has been authorized for detection and/or diagnosis of SARS-CoV-2 by FDA under an Emergency Use Authorization (EUA). This EUA will remain in effect (meaning this test can be used) for the duration of the COVID-19 declaration under Section 564(b)(1) of the Act, 21 U.S.C. section 360bbb-3(b)(1), unless the authorization is terminated or revoked.  Performed at [facility], [address] [place], [place] [address]   Blood culture (routine x 2)     Status: None (Preliminary result)   Collection Time: 02/05/21  6:00 AM   Specimen: BLOOD  Result Value Ref Range Status   Specimen Description BLOOD RIGHT HAND  Final   Special Requests   Final    BOTTLES DRAWN AEROBIC AND ANAEROBIC Blood Culture adequate volume   Culture   Final    NO GROWTH 2 DAYS Performed at [facility], [address], [place], [place] [address]    Report Status PENDING  Incomplete  Blood culture (routine x 2)     Status: None (Preliminary result)   Collection Time: 02/05/21  6:05 AM   Specimen: BLOOD  Result Value Ref Range Status   Specimen  Description BLOOD LEFT HAND  Final   Special Requests   Final    BOTTLES DRAWN AEROBIC AND ANAEROBIC Blood Culture results may not be optimal due to an inadequate volume of blood received in culture bottles   Culture   Final    NO GROWTH 2 DAYS Performed at [facility], [address], [place], [place] [address]    Report Status PENDING  Incomplete  MRSA Next Gen by PCR, Nasal     Status: None   Collection Time: 02/05/21 12:44 PM   Specimen: Nasal Mucosa; Nasal Swab  Result Value Ref Range Status   MRSA by PCR Next Gen NOT DETECTED NOT DETECTED Final    Comment: (NOTE) The GeneXpert MRSA Assay (FDA approved for NASAL specimens only), is one component of a comprehensive MRSA colonization surveillance program. It is not intended to diagnose MRSA infection nor  to guide or monitor treatment for MRSA infections. Test performance is not FDA approved in patients less than 32 years old. Performed at [facility], [place], [place] [address]   Respiratory (~20 pathogens) panel by PCR     Status: None   Collection Time: 02/06/21 10:00 AM   Specimen: Nasopharyngeal Swab; Respiratory  Result Value Ref Range Status   Adenovirus NOT DETECTED NOT DETECTED Final   Coronavirus 229E NOT DETECTED NOT DETECTED Final    Comment: (NOTE) The Coronavirus on the Respiratory Panel, DOES NOT test for the novel  Coronavirus (2019 nCoV)    Coronavirus HKU1 NOT DETECTED NOT DETECTED Final   Coronavirus NL63 NOT DETECTED NOT DETECTED Final   Coronavirus OC43 NOT DETECTED NOT DETECTED Final   Metapneumovirus NOT DETECTED NOT DETECTED Final   Rhinovirus / Enterovirus NOT DETECTED NOT DETECTED Final   Influenza A NOT DETECTED NOT DETECTED Final   Influenza B NOT DETECTED NOT DETECTED Final   Parainfluenza Virus 1 NOT DETECTED NOT DETECTED Final   Parainfluenza Virus 2 NOT DETECTED NOT DETECTED Final   Parainfluenza Virus 3 NOT DETECTED NOT DETECTED Final   Parainfluenza  Virus 4 NOT DETECTED NOT DETECTED Final   Respiratory Syncytial Virus NOT DETECTED NOT DETECTED Final   Bordetella pertussis NOT DETECTED NOT DETECTED Final   Bordetella Parapertussis NOT DETECTED NOT DETECTED Final   Chlamydophila pneumoniae NOT DETECTED NOT DETECTED Final   Mycoplasma pneumoniae NOT DETECTED NOT DETECTED Final    Comment: Performed at [facility], [place]. [address], [place], [place] [address]         Radiology Studies: No results found.      Scheduled Meds:  acetylcysteine  4 mL Nebulization BID   docusate sodium  100 mg Oral Daily   enoxaparin (LOVENOX) injection  40 mg Subcutaneous Q24H   ipratropium-albuterol  3 mL Nebulization TID   multivitamin with minerals  1 tablet Oral Daily   sodium chloride flush  3 mL Intravenous Q12H   Continuous Infusions:  sodium chloride     ampicillin-sulbactam (UNASYN) IV 3 g (02/07/21 0759)     LOS: 2 days    Time spent: 20 min    Desma Maxim, MD [facility]   If 7PM-7AM, please contact night-coverage [email or web address] Password [facility] 02/07/2021, 12:09 PM

## 2021-02-08 LAB — LEGIONELLA PNEUMOPHILA SEROGP 1 UR AG: L. pneumophila Serogp 1 Ur Ag: NEGATIVE

## 2021-02-08 LAB — CBC
HCT: 41.9 % (ref 39.0–52.0)
Hemoglobin: 13.6 g/dL (ref 13.0–17.0)
MCH: 31.8 pg (ref 26.0–34.0)
MCHC: 32.5 g/dL (ref 30.0–36.0)
MCV: 97.9 fL (ref 80.0–100.0)
Platelets: 170 10*3/uL (ref 150–400)
RBC: 4.28 MIL/uL (ref 4.22–5.81)
RDW: 13 % (ref 11.5–15.5)
WBC: 6.4 10*3/uL (ref 4.0–10.5)
nRBC: 0 % (ref 0.0–0.2)

## 2021-02-08 LAB — BASIC METABOLIC PANEL
Anion gap: 7 (ref 5–15)
BUN: 8 mg/dL (ref 6–20)
CO2: 32 mmol/L (ref 22–32)
Calcium: 8.5 mg/dL — ABNORMAL LOW (ref 8.9–10.3)
Chloride: 106 mmol/L (ref 98–111)
Creatinine, Ser: 0.79 mg/dL (ref 0.61–1.24)
GFR, Estimated: >60 mL/min (ref >60)
Glucose, Bld: 110 mg/dL — ABNORMAL HIGH (ref 70–99)
Potassium: 3.3 mmol/L — ABNORMAL LOW (ref 3.5–5.1)
Sodium: 145 mmol/L (ref 135–145)

## 2021-02-08 MED ORDER — POTASSIUM CHLORIDE CRYS ER 20 MEQ PO TBCR
40.0000 meq | EXTENDED_RELEASE_TABLET | Freq: Once | ORAL | Status: AC
  Administered 2021-02-08: 40 meq via ORAL
  Filled 2021-02-08: qty 2

## 2021-02-08 NOTE — Progress Notes (Signed)
   02/08/21 1140  Clinical Encounter Type  Visited With Patient and family together  Visit Type Initial;Spiritual support;Social support  Referral From Chaplain  Consult/Referral To [employer] completed AD with Notary and 2 witness. Chaplain provided the requested copies. Chaplain also offered emotional and spiritual support. Ministered with reflective listening, peaceful presence, and hospitality.

## 2021-02-08 NOTE — Evaluation (Signed)
Occupational Therapy Evaluation Patient Details Name: James [name] MRN: [id number] DOB: 08/22/1965 Today's Date: 02/08/2021   History of Present Illness 55yo M with hs of MD, GERD, OSA, Cardiomyopathy s/p ICD came in with SOB x2 d.  Wife of patient reported NV and signs of aspiration with regurgitation. She noted acute hypoxemia at home <80%.   Clinical Impression   Pt was seen for OT evaluation this date. Prior to hospital admission, pt was ambulating short household distances with RW and spouse beside him for safety. Pt lives with his spouse and has handicap accessible home with ramped entrance, roll in shower, and AE/DME. Spouse provides MIN-MOD A for bathing, dressing, ADL in standing, and toileting at baseline. Currently pt demonstrates decreased strength, activity tolerance, and balance as described below (See OT problem list) which result in increased assist for ADL/self-care tasks. Per RN, spouse assisting pt to/from the bathroom prior to OT evaluation and spouse/pt demonstrate this routine for OT, requiring PRN VC for RW safety/mgt to improve balance. Pt/spouse educated in energy conservation strategies, AE/DME for ADL (toilet risers, gait belt), and falls prevention strategies; handout provided. Pt/spouse verbalized understanding. Pt would benefit from skilled OT services to address noted impairments and functional limitations (see below for any additional details) in order to maximize safety and independence while minimizing falls risk and caregiver burden. Upon hospital discharge, recommend HHOT to maximize pt safety and return to functional independence during meaningful occupations of daily life.     Recommendations for follow up therapy are one component of a multi-disciplinary discharge planning process, led by the attending physician.  Recommendations may be updated based on patient status, additional functional criteria and insurance authorization.   Follow Up Recommendations  Home  health OT    Assistance Recommended at Discharge Intermittent Supervision/Assistance  Functional Status Assessment  Patient has had a recent decline in their functional status and demonstrates the ability to make significant improvements in function in a reasonable and predictable amount of time.  Equipment Recommendations  Other (comment);Toilet riser (gait belt)    Recommendations for Other Services       Precautions / Restrictions Precautions Precautions: Fall Precaution Comments: monitor O2 Restrictions Weight Bearing Restrictions: No      Mobility Bed Mobility Overal bed mobility: Needs Assistance Bed Mobility: Supine to Sit;Sit to Supine     Supine to sit: Supervision Sit to supine: Supervision        Transfers Overall transfer level: Needs assistance Equipment used: Rolling walker (2 wheels) Transfers: Sit to/from Stand Sit to Stand: From elevated surface;Supervision                  Balance Overall balance assessment: Needs assistance Sitting-balance support: No upper extremity supported;Feet supported Sitting balance-Leahy Scale: Good     Standing balance support: Bilateral upper extremity supported;During functional activity;Reliant on assistive device for balance Standing balance-Leahy Scale: Fair Standing balance comment: tendency to hold RW too far in front of him, cues to correct                           ADL either performed or assessed with clinical judgement   ADL                                         General ADL Comments: Pt requires MOD A for LB ADL, MIN A for  seated UB ADL, SBA - CGA for ADL transfers + RW. Spouse able to assist     Vision         Perception     Praxis      Pertinent Vitals/Pain Pain Assessment: No/denies pain     Hand Dominance Right   Extremity/Trunk Assessment Upper Extremity Assessment Upper Extremity Assessment: Generalized weakness   Lower Extremity  Assessment Lower Extremity Assessment: Generalized weakness       Communication Communication Communication: No difficulties   Cognition Arousal/Alertness: Awake/alert Behavior During Therapy: WFL for tasks assessed/performed Overall Cognitive Status: Within Functional Limits for tasks assessed                                       General Comments       Exercises Other Exercises Other Exercises: Pt/spouse educated in energy conservation strategies, AE/DME for ADL (toilet risers, gait belt), and falls prevention strategies; handout provided   Shoulder Instructions      Home Living Family/patient expects to be discharged to:: Private residence Living Arrangements: Spouse/significant other;Children Available Help at Discharge: Family (wife works) Type of Home: [employer] Home Access: Ramped entrance     Home Layout: One level     Bathroom Shower/Tub: Walk-in shower (roll in [employer])   Bathroom Toilet: Handicapped height     Home Equipment: Conservation officer, nature (2 wheels);Cane - single point;BSC/3in1;Adaptive equipment;Grab bars - tub/shower;Grab bars - toilet;Hand held shower head;Toilet riser;Shower seat;Wheelchair - power;Electric scooter;Hospital bed;Other (comment) Adaptive Equipment: Reacher Additional Comments: Has wheelchair accessible van      Prior Functioning/Environment Prior Level of Function : Needs assist       Physical Assist : Mobility (physical) Mobility (physical): Transfers;Gait   Mobility Comments: SBA/CGA from spouse with RW for short HH distances ADLs Comments: Spouse provides MIN-MOD A for bathing, dressing, toileting. Pt able to perform grooming and self feeding        OT Problem List: Decreased strength;Decreased activity tolerance;Impaired balance (sitting and/or standing);Decreased knowledge of use of DME or AE      OT Treatment/Interventions: Self-care/ADL training;Therapeutic exercise;Therapeutic activities;Energy  conservation;DME and/or AE instruction;Patient/family education;Balance training    OT Goals(Current goals can be found in the care plan section) Acute Rehab OT Goals Patient Stated Goal: get stronger OT Goal Formulation: With patient/family Time For Goal Achievement: 02/22/21 Potential to Achieve Goals: Good ADL Goals Additional ADL Goal #1: Pt will perform seated shower with caregiver independent in assisting, use of ECS to support safety. Additional ADL Goal #2: Pt/spouse will identify at least 2 learned ECS to incorporate into daily ADL routine to maximize safety.  OT Frequency: Min 2X/week   Barriers to D/C:            Co-evaluation              AM-PAC OT "6 Clicks" Daily Activity     Outcome Measure Help from another person eating meals?: None Help from another person taking care of personal grooming?: None Help from another person toileting, which includes using toliet, bedpan, or urinal?: A Little Help from another person bathing (including washing, rinsing, drying)?: A Lot Help from another person to put on and taking off regular upper body clothing?: A Little Help from another person to put on and taking off regular lower body clothing?: A Lot 6 Click Score: 18   End of Session Equipment Utilized During Treatment: Gait belt;Rolling walker (2  wheels) Nurse Communication: Mobility status  Activity Tolerance: Patient tolerated treatment well Patient left: in bed;with call bell/phone within reach;with family/visitor present  OT Visit Diagnosis: Other abnormalities of gait and mobility (R26.89);Muscle weakness (generalized) (M62.81)                Time: 0735-4301 OT Time Calculation (min): 38 min Charges:  OT General Charges $OT Visit: 1 Visit OT Evaluation $OT Eval Moderate Complexity: 1 Mod OT Treatments $Self Care/Home Management : 23-37 mins  Ardeth Perfect., MPH, MS, OTR/L ascom [phone] 02/08/21, 5:12 PM

## 2021-02-08 NOTE — Progress Notes (Signed)
Pulmonary Medicine          Date: 02/08/2021,   MRN# [id number] James [name] June 14, 1965     AdmissionWeight: 91.2 kg                 CurrentWeight: 91.2 kg   Referring physician: Dr James [name]   CHIEF COMPLAINT:   Acute on chronic hypoxemic respiratory failure in context of muscular dystrophy   HISTORY OF PRESENT ILLNESS   This is a pleasant 55yo M with hs of MD, GERD, OSA, Cardiomyopathy s/p ICD came in with SOB x2 d.  Wife of patient reported NV and signs of aspiration with regurgitation. She noted acute hypoxemia at home <80%. Labs show sodium 138, potassium 3.4, chloride 105, bicarb 27, glucose 187, BUN 9, creatinine 0.76, calcium 8.3, alkaline phosphatase 67, albumin 3.6, lipase 27, AST 47, ALT 59, total protein 7.1, BNP 104, troponin 8, lactic acid 1.3, procalcitonin less than 0.10, white count 8.9, hemoglobin 15.0, hematocrit 45.5, MCV 98.5, RDW 13.1, platelet count 173. CT angio was done and is negative for PE but + for bibasilar atelectasis and possible pneumonia with cardiomegaly.PCCM consultation for further evaluation and management.    02/07/21- patient s/p BIPAP overnight, repeat spirometry with current FEV1 at 0.8L 20% predicted. Repeat CXR today. BMP and CBC stable. Infectious workup negative thus fur, CRP substantially elevated indicative of possible inflammatory reaction to remote viral prdrome in lieu of low Procal.  Continue to work with RT for BPH via [place]. Patient is in no distress.  He is on mucomysit and is using it well but not expecotrating much debris.   02/08/21- Patient is improved he is down to 2L/min.  Repeat CXR is with bibasilar atelectasis. He is on Mucomyst and Metaneb with slight improvement.  We will stop mucomyst today. He is very deconditioned  and has not had PT/OT yet.   PAST MEDICAL HISTORY   Past Medical History:  Diagnosis Date  . Bronchitis   . Cardiomyopathy ([place])   . Dysphagia   . GERD (gastroesophageal reflux disease)    . History of chicken pox   . History of pneumonia 2013   [facility]  . LBBB (left bundle branch block)   . Myotonic dystrophy, type 1 ([place])    followed by [facility]  . Pneumonia    aspiration  . Positive colorectal cancer screening using Cologuard test 03/31/2017  . Sleep apnea      SURGICAL HISTORY   Past Surgical History:  Procedure Laterality Date  . CARDIAC DEFIBRILLATOR PLACEMENT     defib/pacer  . CARDIAC DEFIBRILLATOR PLACEMENT    . COLONOSCOPY N/A 07/01/2020   Procedure: COLONOSCOPY;  Surgeon: James Manifold, MD;  Location: [facility] ENDOSCOPY;  Service: Endoscopy;  Laterality: N/A;  . COLONOSCOPY WITH PROPOFOL N/A 08/16/2016   Procedure: COLONOSCOPY WITH PROPOFOL;  Surgeon: James Bellows, MD;  Location: [facility] ENDOSCOPY;  Service: Gastroenterology;  Laterality: N/A;  . COLONOSCOPY WITH PROPOFOL N/A 04/04/2017   Procedure: COLONOSCOPY WITH PROPOFOL;  Surgeon: James Bellows, MD;  Location: [facility] ENDOSCOPY;  Service: Gastroenterology;  Laterality: N/A;  . PACEMAKER IMPLANT    . US ECHOCARDIOGRAPHY  09/2010   EF 50%, mld LVH and dysfunction, mild RV dysfunction and enlarged     FAMILY HISTORY   Family History  Problem Relation Age of Onset  . Hypertension Father   . Cancer Paternal Grandfather        lung, smoker  . Cancer Paternal Uncle  bone  . Diabetes Mother   . CAD Mother 81       MI  . Stroke Neg Hx      SOCIAL HISTORY   Social History   Tobacco Use  . Smoking status: Never  . Smokeless tobacco: Never  Vaping Use  . Vaping Use: Never used  Substance Use Topics  . Alcohol use: No  . Drug use: No     MEDICATIONS    Home Medication:    Current Medication:  Current Facility-Administered Medications:  .  0.9 %  sodium chloride infusion, 250 mL, Intravenous, PRN, [name], Tochukwu, MD .  acetaminophen (TYLENOL) tablet 500-1,000 mg, 500-1,000 mg, Oral, Q6H PRN, [name], Tochukwu, MD .  acetylcysteine (MUCOMYST) 20 % nebulizer / oral solution 4  mL, 4 mL, Nebulization, BID, Lanney Gins, James Bond, MD, 4 mL at 02/08/21 0726 .  albuterol (PROVENTIL) (2.5 MG/3ML) 0.083% nebulizer solution 2.5 mg, 2.5 mg, Nebulization, Q6H PRN, [name], Tochukwu, MD, 2.5 mg at 02/06/21 0734 .  Ampicillin-Sulbactam (UNASYN) 3 g in sodium chloride 0.9 % 100 mL IVPB, 3 g, Intravenous, Q6H, James [name], RPH, Last Rate: 200 mL/hr at 02/08/21 0825, 3 g at 02/08/21 0825 .  docusate sodium (COLACE) capsule 100 mg, 100 mg, Oral, Daily, [name], Tochukwu, MD, 100 mg at 02/07/21 1034 .  enoxaparin (LOVENOX) injection 40 mg, 40 mg, Subcutaneous, Q24H, [name], Tochukwu, MD, 40 mg at 02/07/21 1034 .  ipratropium-albuterol (DUONEB) 0.5-2.5 (3) MG/3ML nebulizer solution 3 mL, 3 mL, Nebulization, TID, Wouk, James Rud, MD, 3 mL at 02/08/21 0726 .  multivitamin with minerals tablet 1 tablet, 1 tablet, Oral, Daily, [name], Tochukwu, MD, 1 tablet at 02/07/21 1034 .  ondansetron (ZOFRAN) tablet 4 mg, 4 mg, Oral, Q6H PRN **OR** ondansetron (ZOFRAN) injection 4 mg, 4 mg, Intravenous, Q6H PRN, [name], Tochukwu, MD .  sodium chloride flush (NS) 0.9 % injection 3 mL, 3 mL, Intravenous, Q12H, [name], Tochukwu, MD, 3 mL at 02/07/21 2130 .  sodium chloride flush (NS) 0.9 % injection 3 mL, 3 mL, Intravenous, PRN, [name], Tochukwu, MD    ALLERGIES   Codeine     REVIEW OF SYSTEMS    Review of Systems:  Gen:  Denies  fever, sweats, chills weigh loss  HEENT: Denies blurred vision, double vision, ear pain, eye pain, hearing loss, nose bleeds, sore throat Cardiac:  No dizziness, chest pain or heaviness, chest tightness,edema Resp:   Denies cough or sputum porduction, shortness of breath,wheezing, hemoptysis,  Gi: Denies swallowing difficulty, stomach pain, nausea or vomiting, diarrhea, constipation, bowel incontinence Gu:  Denies bladder incontinence, burning urine Ext:   Denies Joint pain, stiffness or swelling Skin: Denies  skin rash, easy bruising or bleeding or hives Endoc:   Denies polyuria, polydipsia , polyphagia or weight change Psych:   Denies depression, insomnia or hallucinations   Other:  All other systems negative   VS: BP 119/78 (BP Location: Left Arm)   Pulse 69   Temp (!) 97.5 F (36.4 C)   Resp 16   Ht 5' 11"  (1.803 m)   Wt 91.2 kg   SpO2 95%   BMI 28.03 kg/m      PHYSICAL EXAM    GENERAL:NAD, no fevers, chills, no weakness no fatigue HEAD: Normocephalic, atraumatic.  EYES: Pupils equal, round, reactive to light. Extraocular muscles intact. No scleral icterus.  MOUTH: Moist mucosal membrane. Dentition intact. No abscess noted.  EAR, NOSE, THROAT: Clear without exudates. No external lesions.  NECK: Supple. No thyromegaly. No nodules. No  JVD.  PULMONARY: Diffuse coarse rhonchi right sided +wheezes CARDIOVASCULAR: S1 and S2. Regular rate and rhythm. No murmurs, rubs, or gallops. No edema. Pedal pulses 2+ bilaterally.  GASTROINTESTINAL: Soft, nontender, nondistended. No masses. Positive bowel sounds. No hepatosplenomegaly.  MUSCULOSKELETAL: No swelling, clubbing, or edema. Range of motion full in all extremities.  NEUROLOGIC: Cranial nerves II through XII are intact. No gross focal neurological deficits. Sensation intact. Reflexes intact.  SKIN: No ulceration, lesions, rashes, or cyanosis. Skin warm and dry. Turgor intact.  PSYCHIATRIC: Mood, affect within normal limits. The patient is awake, alert and oriented x 3. Insight, judgment intact.       IMAGING    CT Angio Chest PE W and/or Wo Contrast  Result Date: 02/05/2021 CLINICAL DATA:  High probability suspicion for pulmonary embolus EXAM: CT ANGIOGRAPHY CHEST WITH CONTRAST TECHNIQUE: Multidetector CT imaging of the chest was performed using the standard protocol during bolus administration of intravenous contrast. Multiplanar CT image reconstructions and MIPs were obtained to evaluate the vascular anatomy. CONTRAST:  50m OMNIPAQUE IOHEXOL 350 MG/ML SOLN COMPARISON:  Prior CT scan  of the chest 10/06/2013 FINDINGS: Cardiovascular: Adequate opacification of the pulmonary arteries to the proximal segmental level. No evidence of acute pulmonary embolus. Three vessel arch with variant anatomy. The right brachiocephalic and left common carotid artery share a common origin while the left vertebral artery arises directly from the aorta. No evidence of aneurysm. Left subclavian approach cardiac rhythm maintenance device. Leads terminate in the right atrium, right ventricular apex and within a cardiac vein overlying the left ventricle. The heart is at the upper limits of normal for size. No pericardial effusion. Mediastinum/Nodes: Unremarkable CT appearance of the thyroid gland. No suspicious mediastinal or hilar adenopathy. No soft tissue mediastinal mass. The thoracic esophagus is unremarkable. Lungs/Pleura: Bilateral lower lobe atelectasis. No pulmonary edema or pleural effusion. Upper Abdomen: Calcified stones within the gallbladder neck. No gallbladder distension. No biliary ductal abnormality. Musculoskeletal: No acute fracture or aggressive appearing lytic or blastic osseous lesion. Review of the MIP images confirms the above findings. IMPRESSION: 1. Negative for acute pulmonary embolus, pneumonia or other acute cardiopulmonary process. 2. Bilateral lower lobe atelectasis suggest hypoventilation. 3. Biventricular cardiac rhythm maintenance device in place. 4. Cholelithiasis. Electronically Signed   By: HJacqulynn CadetM.D.   On: 02/05/2021 07:33   DG Chest Port 1 View  Result Date: 02/07/2021 CLINICAL DATA:  Shortness of breath. EXAM: PORTABLE CHEST 1 VIEW COMPARISON:  February 05, 2021 FINDINGS: The cardiomediastinal silhouette is unchanged in contour.LEFT-sided AICD. No pleural effusion. No pneumothorax. LEFT retrocardiac opacity, mildly increased in comparison to prior. RIGHT basilar linear opacities consistent with atelectasis. Visualized abdomen is unremarkable. Multilevel  degenerative changes of the thoracic spine. IMPRESSION: Increased LEFT basilar consolidative opacities, nonspecific. Persistent RIGHT basilar platelike opacities most consistent with atelectasis. Electronically Signed   By: SValentino SaxonM.D.   On: 02/07/2021 12:47   DG Chest Portable 1 View  Result Date: 02/05/2021 CLINICAL DATA:  Cough EXAM: PORTABLE CHEST 1 VIEW COMPARISON:  Prior chest x-ray 10/14/2019 FINDINGS: Left subclavian approach biventricular cardiac rhythm maintenance device. Leads project over the right atrium, right ventricle and overlying the left ventricle. Mild cardiomegaly. Extremely low inspiratory volumes with bibasilar airspace opacities. Linear opacity overlying the right lung base along the minor fissure suggests right middle lobe atelectasis. Central and bilateral lower lobes the central airway thickening and peribronchial cuffing. No large effusion or pneumothorax. IMPRESSION: 1. Very low inspiratory volumes with bibasilar airspace opacities likely reflecting atelectasis.  Superimposed infiltrate/pneumonia would be difficult to exclude, particularly on the left. 2. Right middle lobe atelectasis. 3. Biventricular cardiac rhythm maintenance device. Electronically Signed   By: Jacqulynn Cadet M.D.   On: 02/05/2021 05:46      ASSESSMENT/PLAN   Acute hypoxemic respiratory failure - present on admission  - COVID19 -negative   - supplemental O2 during my evaluation -3L/min  - will perform infectious workup for pneumonia -Respiratory viral panel -serum fungitell -legionella ab -strep pneumoniae ur AG -Histoplasma Ur Ag -sputum resp cultures -AFB sputum expectorated specimen -sputum cytology  -reviewed pertinent imaging with patient today - ESR -PT/OT for d/c planning  -please encourage patient to use incentive spirometer few times each hour while hospitalized.     Chronic cardiomyopathy s/p PPM AICD  Previously seen by [facility]  -repeat TTE -acute on chronic heart  failure?  -BNP is >100 with obese body habitus   Bibasilar atelectasis  - Metaneb with albuterol Q8h   - mucomyst 36m BID neb with RT    - IS and flutter at bedside   - encourage to participate in bronchopulmonary hygiene  OSA on cpap   - QHS CPAP while in house    - spirometry with graph due to muscular dystrophy - patient may need BIPAP long term    Thank you for allowing me to participate in the care of this patient.   Patient/Family are satisfied with care plan and all questions have been answered.  This document was prepared using Dragon voice recognition software and may include unintentional dictation errors.     FOttie Glazier M.D.  Division of [place]

## 2021-02-08 NOTE — ACP (Advance Care Planning) (Signed)
PT has AD as of 02/08/21  Point of contact: Tyray Proch (Wife)  [phone] (home) [phone] (cell)

## 2021-02-08 NOTE — Progress Notes (Signed)
PROGRESS NOTE    James [name]  JKK:[id number] DOB: 10-25-1965 DOA: 02/05/2021 PCP: Leone Haven, MD  Outpatient Specialists: neurology, cardiology, pulmonology, SLP, GI    Brief Narrative:   From admission h and p James [name] is a 55 y.o. male with medical history significant for muscular dystrophy, dysphagia, GERD, sleep apnea, cardiomyopathy status post ICD placement who presents to the ER for evaluation of shortness of breath. Patient's wife states that 2 days prior to his admission he developed nausea and had multiple episodes of emesis.  She said he usually has digestive symptoms and this is not unusual for him.  He also had a choking spell which he normally gets and she was afraid he may have aspirated.  For the last 2 days he has had a cough that is nonproductive associated with shortness of breath and hoarseness.  He denies having any fever or chills and denies having any chest pain.  She checked his pulse oximetry on room air at rest and it was in the 70s so she brought him to the ER for further evaluation. Patient does not wear home oxygen at baseline but has been discharged home on oxygen in the past after treatment for pneumonia. He denies having any palpitations, no diaphoresis, no headache, no changes in his bowel habits, no dizziness, no lightheadedness, no abdominal pain, no leg swelling, no urinary symptoms, no leg swelling, no blurred vision no focal deficit.   Assessment & Plan:   Principal Problem:   Acute respiratory failure (HCC) Active Problems:   Cardiomyopathy ([place])   Myotonic dystrophy, type 1 ([place])   Dysphagia   Hypokalemia   Aspiration pneumonia ([place])  # Aspiration pneumonitis # Acute hypoxic respiratory failure Hx dysphagia 2/2 myotonic dystrophy. Followed by GI and SLP as outpt. Had what appears to be aspiration event at home. CT w/o infiltrate. O2 80s, requiring 2 L. Stable on that, no respiratory distress. Has been evaluated by speech here  and cleared to eat, his dysphagia seems to be at its baseline. Underlying myotonic dystrophy main underlying cause. - pulm following, appreciate recs. Continue metanebs, IS - pneumonia studies pending, results neg to date - non-productive rare cough, no sputum has been obtained - continuing unasyn (started 11/11), pulm advises continuing for now - wean o2 as able - pt consulted, patient interested in rehab if qualifies  # Cardiomyopathy S/p crt-d placement. Appears asymptomatic, euvolemic  # Myotonic dystrophy At baseline, followed closely by neurology  # OSA - bipap qhs  # Thrombocytopenia Resolved  - monitor, further w/u if persists    DVT prophylaxis: lovenox Code Status: dnr Family Communication: wife updated @ bedside 11/14  Level of care: Telemetry Medical Status is: Inpatient  Remains inpatient appropriate because: severity of illness        Consultants:  pulmonology  Procedures: none  Antimicrobials:  Unasyn 11/11>    Subjective: Reports feeling improved w/ more energy. Tolerating diet no further choking/aspiration. Breathing more comfortably. No pain.  Objective: Vitals:   02/08/21 0455 02/08/21 0729 02/08/21 0809 02/08/21 1025  BP: 106/63  119/78   Pulse: 71 73 69   Resp: 20 20 16    Temp: 99.4 F (37.4 C)  (!) 97.5 F (36.4 C)   TempSrc: Oral     SpO2: 94% 90% 95% 92%  Weight:      Height:       No intake or output data in the 24 hours ending 02/08/21 1353  Filed Weights   02/05/21  1027  Weight: 91.2 kg    Examination:  General exam: Appears calm and comfortable  Respiratory system: rales at bases bilaterally Cardiovascular system: S1 & S2 heard, RRR. No JVD, murmurs, rubs, gallops or clicks. No pedal edema. Gastrointestinal system: Abdomen is nondistended, soft and nontender. No organomegaly or masses felt. Normal bowel sounds heard. Central nervous system: Alert and oriented. No focal neurological deficits. Extremities:  Symmetric 5 x 5 power. Skin: No rashes, lesions or ulcers Psychiatry: flat affect MSK: decreased tone    Data Reviewed: I have personally reviewed following labs and imaging studies  CBC: Recent Labs  Lab 02/05/21 0529 02/06/21 0404 02/07/21 0413 02/08/21 0616  WBC 8.9 7.5 7.1 6.4  NEUTROABS 7.0  --   --   --   HGB 15.0 14.4 14.3 13.6  HCT 45.5 43.9 43.6 41.9  MCV 98.5 100.0 98.9 97.9  PLT 173 133* 179 253   Basic Metabolic Panel: Recent Labs  Lab 02/05/21 0529 02/06/21 0404 02/07/21 0413 02/08/21 0616  NA 138 144 144 145  K 3.4* 3.9 3.9 3.3*  CL 105 108 109 106  CO2 27 30 31  32  GLUCOSE 187* 101* 111* 110*  BUN 9 10 10 8   CREATININE 0.76 0.73 0.73 0.79  CALCIUM 8.3* 8.5* 8.4* 8.5*   GFR: Estimated Creatinine Clearance: 120.6 mL/min (by C-G formula based on SCr of 0.79 mg/dL). Liver Function Tests: Recent Labs  Lab 02/05/21 0529  AST 47*  ALT 59*  ALKPHOS 67  BILITOT 2.4*  PROT 7.1  ALBUMIN 3.6   Recent Labs  Lab 02/05/21 0529  LIPASE 27   No results for input(s): AMMONIA in the last 168 hours. Coagulation Profile: No results for input(s): INR, PROTIME in the last 168 hours. Cardiac Enzymes: No results for input(s): CKTOTAL, CKMB, CKMBINDEX, TROPONINI in the last 168 hours. BNP (last 3 results) No results for input(s): PROBNP in the last 8760 hours. HbA1C: No results for input(s): HGBA1C in the last 72 hours. CBG: No results for input(s): GLUCAP in the last 168 hours. Lipid Profile: No results for input(s): CHOL, HDL, LDLCALC, TRIG, CHOLHDL, LDLDIRECT in the last 72 hours. Thyroid Function Tests: No results for input(s): TSH, T4TOTAL, FREET4, T3FREE, THYROIDAB in the last 72 hours. Anemia Panel: No results for input(s): VITAMINB12, FOLATE, FERRITIN, TIBC, IRON, RETICCTPCT in the last 72 hours. Urine analysis:    Component Value Date/Time   COLORURINE YELLOW (A) 06/29/2020 1603   APPEARANCEUR CLEAR (A) 06/29/2020 1603   APPEARANCEUR Clear  01/23/2014 1256   LABSPEC >1.046 (H) 06/29/2020 1603   LABSPEC 1.006 01/23/2014 1256   PHURINE 6.0 06/29/2020 1603   GLUCOSEU NEGATIVE 06/29/2020 1603   GLUCOSEU Negative 01/23/2014 1256   HGBUR NEGATIVE 06/29/2020 1603   BILIRUBINUR NEGATIVE 06/29/2020 1603   BILIRUBINUR Negative 01/23/2014 1256   KETONESUR NEGATIVE 06/29/2020 1603   PROTEINUR NEGATIVE 06/29/2020 1603   NITRITE NEGATIVE 06/29/2020 1603   LEUKOCYTESUR NEGATIVE 06/29/2020 1603   LEUKOCYTESUR Negative 01/23/2014 1256   Sepsis Labs: @LABRCNTIP (procalcitonin:4,lacticidven:4)  ) Recent Results (from the past 240 hour(s))  Resp Panel by RT-PCR (Flu A&B, Covid) Nasopharyngeal Swab     Status: None   Collection Time: 02/05/21  5:27 AM   Specimen: Nasopharyngeal Swab; Nasopharyngeal(NP) swabs in vial transport medium  Result Value Ref Range Status   SARS Coronavirus 2 by RT PCR NEGATIVE NEGATIVE Final    Comment: (NOTE) SARS-CoV-2 target nucleic acids are NOT DETECTED.  The SARS-CoV-2 RNA is generally detectable in upper respiratory specimens  during the acute phase of infection. The lowest concentration of SARS-CoV-2 viral copies this assay can detect is 138 copies/mL. A negative result does not preclude SARS-Cov-2 infection and should not be used as the sole basis for treatment or other patient management decisions. A negative result may occur with  improper specimen collection/handling, submission of specimen other than nasopharyngeal swab, presence of viral mutation(s) within the areas targeted by this assay, and inadequate number of viral copies(<138 copies/mL). A negative result must be combined with clinical observations, patient history, and epidemiological information. The expected result is Negative.  Fact Sheet for Patients:  EntrepreneurPulse.com.au  Fact Sheet for Healthcare Providers:  IncredibleEmployment.be  This test is no t yet approved or cleared by the [place] FDA and  has been authorized for detection and/or diagnosis of SARS-CoV-2 by FDA under an Emergency Use Authorization (EUA). This EUA will remain  in effect (meaning this test can be used) for the duration of the COVID-19 declaration under Section 564(b)(1) of the Act, 21 U.S.C.section 360bbb-3(b)(1), unless the authorization is terminated  or revoked sooner.       Influenza A by PCR NEGATIVE NEGATIVE Final   Influenza B by PCR NEGATIVE NEGATIVE Final    Comment: (NOTE) The Xpert Xpress SARS-CoV-2/FLU/RSV plus assay is intended as an aid in the diagnosis of influenza from Nasopharyngeal swab specimens and should not be used as a sole basis for treatment. Nasal washings and aspirates are unacceptable for Xpert Xpress SARS-CoV-2/FLU/RSV testing.  Fact Sheet for Patients: EntrepreneurPulse.com.au  Fact Sheet for Healthcare Providers: IncredibleEmployment.be  This test is not yet approved or cleared by the [place] FDA and has been authorized for detection and/or diagnosis of SARS-CoV-2 by FDA under an Emergency Use Authorization (EUA). This EUA will remain in effect (meaning this test can be used) for the duration of the COVID-19 declaration under Section 564(b)(1) of the Act, 21 U.S.C. section 360bbb-3(b)(1), unless the authorization is terminated or revoked.  Performed at [facility], [address] [place], [place] [address]   Blood culture (routine x 2)     Status: None (Preliminary result)   Collection Time: 02/05/21  6:00 AM   Specimen: BLOOD  Result Value Ref Range Status   Specimen Description BLOOD RIGHT HAND  Final   Special Requests   Final    BOTTLES DRAWN AEROBIC AND ANAEROBIC Blood Culture adequate volume   Culture   Final    NO GROWTH 3 DAYS Performed at [facility], [address] [place], [place] [address]    Report Status PENDING  Incomplete  Blood culture (routine x 2)     Status:  None (Preliminary result)   Collection Time: 02/05/21  6:05 AM   Specimen: BLOOD  Result Value Ref Range Status   Specimen Description BLOOD LEFT HAND  Final   Special Requests   Final    BOTTLES DRAWN AEROBIC AND ANAEROBIC Blood Culture results may not be optimal due to an inadequate volume of blood received in culture bottles   Culture   Final    NO GROWTH 3 DAYS Performed at [facility], [address] [place], [place] [address]    Report Status PENDING  Incomplete  MRSA Next Gen by PCR, Nasal     Status: None   Collection Time: 02/05/21 12:44 PM   Specimen: Nasal Mucosa; Nasal Swab  Result Value Ref Range Status   MRSA by PCR Next Gen NOT DETECTED NOT DETECTED Final    Comment: (  NOTE) The GeneXpert MRSA Assay (FDA approved for NASAL specimens only), is one component of a comprehensive MRSA colonization surveillance program. It is not intended to diagnose MRSA infection nor to guide or monitor treatment for MRSA infections. Test performance is not FDA approved in patients less than 24 years old. Performed at [facility], [place], [place] [address]   Respiratory (~20 pathogens) panel by PCR     Status: None   Collection Time: 02/06/21 10:00 AM   Specimen: Nasopharyngeal Swab; Respiratory  Result Value Ref Range Status   Adenovirus NOT DETECTED NOT DETECTED Final   Coronavirus 229E NOT DETECTED NOT DETECTED Final    Comment: (NOTE) The Coronavirus on the Respiratory Panel, DOES NOT test for the novel  Coronavirus (2019 nCoV)    Coronavirus HKU1 NOT DETECTED NOT DETECTED Final   Coronavirus NL63 NOT DETECTED NOT DETECTED Final   Coronavirus OC43 NOT DETECTED NOT DETECTED Final   Metapneumovirus NOT DETECTED NOT DETECTED Final   Rhinovirus / Enterovirus NOT DETECTED NOT DETECTED Final   Influenza A NOT DETECTED NOT DETECTED Final   Influenza B NOT DETECTED NOT DETECTED Final   Parainfluenza Virus 1 NOT DETECTED NOT DETECTED Final    Parainfluenza Virus 2 NOT DETECTED NOT DETECTED Final   Parainfluenza Virus 3 NOT DETECTED NOT DETECTED Final   Parainfluenza Virus 4 NOT DETECTED NOT DETECTED Final   Respiratory Syncytial Virus NOT DETECTED NOT DETECTED Final   Bordetella pertussis NOT DETECTED NOT DETECTED Final   Bordetella Parapertussis NOT DETECTED NOT DETECTED Final   Chlamydophila pneumoniae NOT DETECTED NOT DETECTED Final   Mycoplasma pneumoniae NOT DETECTED NOT DETECTED Final    Comment: Performed at [facility], [place]. [address] [place], [place] [address]         Radiology Studies: DG Chest Port 1 View  Result Date: 02/07/2021 CLINICAL DATA:  Shortness of breath. EXAM: PORTABLE CHEST 1 VIEW COMPARISON:  February 05, 2021 FINDINGS: The cardiomediastinal silhouette is unchanged in contour.LEFT-sided AICD. No pleural effusion. No pneumothorax. LEFT retrocardiac opacity, mildly increased in comparison to prior. RIGHT basilar linear opacities consistent with atelectasis. Visualized abdomen is unremarkable. Multilevel degenerative changes of the thoracic spine. IMPRESSION: Increased LEFT basilar consolidative opacities, nonspecific. Persistent RIGHT basilar platelike opacities most consistent with atelectasis. Electronically Signed   By: Valentino Saxon M.D.   On: 02/07/2021 12:47        Scheduled Meds:  docusate sodium  100 mg Oral Daily   enoxaparin (LOVENOX) injection  40 mg Subcutaneous Q24H   ipratropium-albuterol  3 mL Nebulization TID   multivitamin with minerals  1 tablet Oral Daily   sodium chloride flush  3 mL Intravenous Q12H   Continuous Infusions:  sodium chloride     ampicillin-sulbactam (UNASYN) IV 3 g (02/08/21 1342)     LOS: 3 days    Time spent: 20 min    Desma Maxim, MD [facility]   If 7PM-7AM, please contact night-coverage [email or web address] Password [email or web address] 02/08/2021, 1:53 PM

## 2021-02-09 LAB — BASIC METABOLIC PANEL
Anion gap: 7 (ref 5–15)
BUN: 11 mg/dL (ref 6–20)
CO2: 27 mmol/L (ref 22–32)
Calcium: 8.6 mg/dL — ABNORMAL LOW (ref 8.9–10.3)
Chloride: 107 mmol/L (ref 98–111)
Creatinine, Ser: 0.78 mg/dL (ref 0.61–1.24)
GFR, Estimated: >60 mL/min (ref >60)
Glucose, Bld: 106 mg/dL — ABNORMAL HIGH (ref 70–99)
Potassium: 3.7 mmol/L (ref 3.5–5.1)
Sodium: 141 mmol/L (ref 135–145)

## 2021-02-09 LAB — MAGNESIUM: Magnesium: 2.5 mg/dL — ABNORMAL HIGH (ref 1.7–2.4)

## 2021-02-09 MED ORDER — AMOXICILLIN-POT CLAVULANATE 875-125 MG PO TABS: 1.0000 | ORAL_TABLET | Freq: Two times a day (BID) | ORAL | 0 refills | Status: DC

## 2021-02-09 NOTE — Discharge Summary (Signed)
James [name] WNI:[id number] DOB: 1965-08-22 DOA: 02/05/2021  PCP: James Haven, MD  Admit date: 02/05/2021 Discharge date: 02/09/2021  Time spent: 35 minutes  Recommendations for Outpatient Follow-up:  Close pulmonology f/u     Discharge Diagnoses:  Principal Problem:   Acute respiratory failure ([place]) Active Problems:   Cardiomyopathy ([place])   Myotonic dystrophy, type 1 ([place])   Dysphagia   Hypokalemia   Aspiration pneumonia ([place])   Discharge Condition: stable  Diet recommendation: heart healthy  Filed Weights   02/05/21 0517  Weight: 91.2 kg    History of present illness:  James [name] is a 55 y.o. male with medical history significant for muscular dystrophy, dysphagia, GERD, sleep apnea, cardiomyopathy status post ICD placement who presents to the ER for evaluation of shortness of breath. Patient's wife states that 2 days prior to his admission he developed nausea and had multiple episodes of emesis.  She said he usually has digestive symptoms and this is not unusual for him.  He also had a choking spell which he normally gets and she was afraid he may have aspirated.  For the last 2 days he has had a cough that is nonproductive associated with shortness of breath and hoarseness.  He denies having any fever or chills and denies having any chest pain.  She checked his pulse oximetry on room air at rest and it was in the 70s so she brought him to the ER for further evaluation. Patient does not wear home oxygen at baseline but has been discharged home on oxygen in the past after treatment for pneumonia. He denies having any palpitations, no diaphoresis, no headache, no changes in his bowel habits, no dizziness, no lightheadedness, no abdominal pain, no leg swelling, no urinary symptoms, no leg swelling, no blurred vision no focal deficit.  Hospital Course:  # Aspiration pneumonitis # Acute hypoxic respiratory failure Hx dysphagia 2/2 myotonic dystrophy. Followed by GI  and SLP as outpt. Had what appears to be aspiration event at home. CT w/o infiltrate. O2 80s, requiring 2 L, since weaned off but with persistent o2 requirement with ambulation. No respiratory distress. Has been evaluated by speech here and cleared to eat, his dysphagia seems to be at its baseline. Underlying myotonic dystrophy main underlying cause.  - 2 more days augmentin to treat 7 day course of abx for aspiration pneumonia (treated w/ unasyn here) - home O2 ordered - home health pt/ot - close pulmonology f/u  # Cardiomyopathy S/p crt-d placement. Appears asymptomatic, euvolemic   # Myotonic dystrophy At baseline, followed closely by neurology   # OSA - bipap qhs   # Thrombocytopenia Mild, resolved spontaneously   Procedures: none   Consultations: pulmonology  Discharge Exam: Vitals:   02/09/21 0715 02/09/21 0754  BP:  103/70  Pulse: 78 72  Resp: 16 16  Temp:  97.7 F (36.5 C)  SpO2: 91% 93%    General exam: Appears calm and comfortable  Respiratory system: rales at bases bilaterally Cardiovascular system: S1 & S2 heard, RRR. No JVD, murmurs, rubs, gallops or clicks. No pedal edema. Gastrointestinal system: Abdomen is nondistended, soft and nontender. No organomegaly or masses felt. Normal bowel sounds heard. Central nervous system: Alert and oriented. No focal neurological deficits. Extremities: Symmetric 5 x 5 power. Skin: No rashes, lesions or ulcers Psychiatry: flat affect MSK: decreased tone  Discharge Instructions   Discharge Instructions     Diet - low sodium heart healthy   Complete by: As directed  Diet - low sodium heart healthy   Complete by: As directed    Increase activity slowly   Complete by: As directed    Increase activity slowly   Complete by: As directed       Allergies as of 02/09/2021       Reactions   Codeine Nausea Only        Medication List     TAKE these medications    acetaminophen 500 MG tablet Commonly known  as: TYLENOL Take 500-1,000 mg by mouth every 6 (six) hours as needed for moderate pain or headache.   albuterol (2.5 MG/3ML) 0.083% nebulizer solution Commonly known as: PROVENTIL Take 3 mLs (2.5 mg total) by nebulization every 6 (six) hours as needed for wheezing or shortness of breath.   amoxicillin-clavulanate 875-125 MG tablet Commonly known as: Augmentin Take 1 tablet by mouth 2 (two) times daily.   docusate sodium 100 MG capsule Commonly known as: COLACE Take 100 mg by mouth daily as needed for mild constipation.   multivitamin tablet Take 1 tablet by mouth daily.   ZOFRAN PO Take 1 tablet by mouth every 8 (eight) hours as needed (nausea/vomiting).               Durable Medical Equipment  (From admission, onward)           Start     Ordered   02/09/21 1319  DME Oxygen  Once       Comments: POC eval  Question Answer Comment  Length of Need 6 Months   Mode or (Route) Nasal cannula   Liters per Minute 2   Frequency Continuous (stationary and portable oxygen unit needed)   Oxygen conserving device No   Oxygen delivery system Gas      02/09/21 1319           Allergies  Allergen Reactions   Codeine Nausea Only    Follow-up Information     Erby Pian, MD Follow up.   Specialty: Specialist Contact information: [place] [address] [phone]                  The results of significant diagnostics from this hospitalization (including imaging, microbiology, ancillary and laboratory) are listed below for reference.    Significant Diagnostic Studies: CT Angio Chest PE W and/or Wo Contrast  Result Date: 02/05/2021 CLINICAL DATA:  High probability suspicion for pulmonary embolus EXAM: CT ANGIOGRAPHY CHEST WITH CONTRAST TECHNIQUE: Multidetector CT imaging of the chest was performed using the standard protocol during bolus administration of intravenous contrast. Multiplanar CT image reconstructions and MIPs were  obtained to evaluate the vascular anatomy. CONTRAST:  81mL OMNIPAQUE IOHEXOL 350 MG/ML SOLN COMPARISON:  Prior CT scan of the chest 10/06/2013 FINDINGS: Cardiovascular: Adequate opacification of the pulmonary arteries to the proximal segmental level. No evidence of acute pulmonary embolus. Three vessel arch with variant anatomy. The right brachiocephalic and left common carotid artery share a common origin while the left vertebral artery arises directly from the aorta. No evidence of aneurysm. Left subclavian approach cardiac rhythm maintenance device. Leads terminate in the right atrium, right ventricular apex and within a cardiac vein overlying the left ventricle. The heart is at the upper limits of normal for size. No pericardial effusion. Mediastinum/Nodes: Unremarkable CT appearance of the thyroid gland. No suspicious mediastinal or hilar adenopathy. No soft tissue mediastinal mass. The thoracic esophagus is unremarkable. Lungs/Pleura: Bilateral lower lobe atelectasis. No pulmonary edema or pleural effusion.  Upper Abdomen: Calcified stones within the gallbladder neck. No gallbladder distension. No biliary ductal abnormality. Musculoskeletal: No acute fracture or aggressive appearing lytic or blastic osseous lesion. Review of the MIP images confirms the above findings. IMPRESSION: 1. Negative for acute pulmonary embolus, pneumonia or other acute cardiopulmonary process. 2. Bilateral lower lobe atelectasis suggest hypoventilation. 3. Biventricular cardiac rhythm maintenance device in place. 4. Cholelithiasis. Electronically Signed   By: Jacqulynn Cadet M.D.   On: 02/05/2021 07:33   DG Chest Port 1 View  Result Date: 02/07/2021 CLINICAL DATA:  Shortness of breath. EXAM: PORTABLE CHEST 1 VIEW COMPARISON:  February 05, 2021 FINDINGS: The cardiomediastinal silhouette is unchanged in contour.LEFT-sided AICD. No pleural effusion. No pneumothorax. LEFT retrocardiac opacity, mildly increased in comparison to prior.  RIGHT basilar linear opacities consistent with atelectasis. Visualized abdomen is unremarkable. Multilevel degenerative changes of the thoracic spine. IMPRESSION: Increased LEFT basilar consolidative opacities, nonspecific. Persistent RIGHT basilar platelike opacities most consistent with atelectasis. Electronically Signed   By: Valentino Saxon M.D.   On: 02/07/2021 12:47   DG Chest Portable 1 View  Result Date: 02/05/2021 CLINICAL DATA:  Cough EXAM: PORTABLE CHEST 1 VIEW COMPARISON:  Prior chest x-ray 10/14/2019 FINDINGS: Left subclavian approach biventricular cardiac rhythm maintenance device. Leads project over the right atrium, right ventricle and overlying the left ventricle. Mild cardiomegaly. Extremely low inspiratory volumes with bibasilar airspace opacities. Linear opacity overlying the right lung base along the minor fissure suggests right middle lobe atelectasis. Central and bilateral lower lobes the central airway thickening and peribronchial cuffing. No large effusion or pneumothorax. IMPRESSION: 1. Very low inspiratory volumes with bibasilar airspace opacities likely reflecting atelectasis. Superimposed infiltrate/pneumonia would be difficult to exclude, particularly on the left. 2. Right middle lobe atelectasis. 3. Biventricular cardiac rhythm maintenance device. Electronically Signed   By: Jacqulynn Cadet M.D.   On: 02/05/2021 05:46    Microbiology: Recent Results (from the past 240 hour(s))  Resp Panel by RT-PCR (Flu A&B, Covid) Nasopharyngeal Swab     Status: None   Collection Time: 02/05/21  5:27 AM   Specimen: Nasopharyngeal Swab; Nasopharyngeal(NP) swabs in vial transport medium  Result Value Ref Range Status   SARS Coronavirus 2 by RT PCR NEGATIVE NEGATIVE Final    Comment: (NOTE) SARS-CoV-2 target nucleic acids are NOT DETECTED.  The SARS-CoV-2 RNA is generally detectable in upper respiratory specimens during the acute phase of infection. The lowest concentration of  SARS-CoV-2 viral copies this assay can detect is 138 copies/mL. A negative result does not preclude SARS-Cov-2 infection and should not be used as the sole basis for treatment or other patient management decisions. A negative result may occur with  improper specimen collection/handling, submission of specimen other than nasopharyngeal swab, presence of viral mutation(s) within the areas targeted by this assay, and inadequate number of viral copies(<138 copies/mL). A negative result must be combined with clinical observations, patient history, and epidemiological information. The expected result is Negative.  Fact Sheet for Patients:  EntrepreneurPulse.com.au  Fact Sheet for Healthcare Providers:  IncredibleEmployment.be  This test is no t yet approved or cleared by the [place] FDA and  has been authorized for detection and/or diagnosis of SARS-CoV-2 by FDA under an Emergency Use Authorization (EUA). This EUA will remain  in effect (meaning this test can be used) for the duration of the COVID-19 declaration under Section 564(b)(1) of the Act, 21 U.S.C.section 360bbb-3(b)(1), unless the authorization is terminated  or revoked sooner.       Influenza A by  PCR NEGATIVE NEGATIVE Final   Influenza B by PCR NEGATIVE NEGATIVE Final    Comment: (NOTE) The Xpert Xpress SARS-CoV-2/FLU/RSV plus assay is intended as an aid in the diagnosis of influenza from Nasopharyngeal swab specimens and should not be used as a sole basis for treatment. Nasal washings and aspirates are unacceptable for Xpert Xpress SARS-CoV-2/FLU/RSV testing.  Fact Sheet for Patients: EntrepreneurPulse.com.au  Fact Sheet for Healthcare Providers: IncredibleEmployment.be  This test is not yet approved or cleared by the [place] FDA and has been authorized for detection and/or diagnosis of SARS-CoV-2 by FDA under an Emergency Use  Authorization (EUA). This EUA will remain in effect (meaning this test can be used) for the duration of the COVID-19 declaration under Section 564(b)(1) of the Act, 21 U.S.C. section 360bbb-3(b)(1), unless the authorization is terminated or revoked.  Performed at [facility], [address] [place], [place] [address]   Blood culture (routine x 2)     Status: None (Preliminary result)   Collection Time: 02/05/21  6:00 AM   Specimen: BLOOD  Result Value Ref Range Status   Specimen Description BLOOD RIGHT HAND  Final   Special Requests   Final    BOTTLES DRAWN AEROBIC AND ANAEROBIC Blood Culture adequate volume   Culture   Final    NO GROWTH 3 DAYS Performed at [facility], [address], [place], [place] [address]    Report Status PENDING  Incomplete  Blood culture (routine x 2)     Status: None (Preliminary result)   Collection Time: 02/05/21  6:05 AM   Specimen: BLOOD  Result Value Ref Range Status   Specimen Description BLOOD LEFT HAND  Final   Special Requests   Final    BOTTLES DRAWN AEROBIC AND ANAEROBIC Blood Culture results may not be optimal due to an inadequate volume of blood received in culture bottles   Culture   Final    NO GROWTH 3 DAYS Performed at [facility], [address] [place], [place] [address]    Report Status PENDING  Incomplete  MRSA Next Gen by PCR, Nasal     Status: None   Collection Time: 02/05/21 12:44 PM   Specimen: Nasal Mucosa; Nasal Swab  Result Value Ref Range Status   MRSA by PCR Next Gen NOT DETECTED NOT DETECTED Final    Comment: (NOTE) The GeneXpert MRSA Assay (FDA approved for NASAL specimens only), is one component of a comprehensive MRSA colonization surveillance program. It is not intended to diagnose MRSA infection nor to guide or monitor treatment for MRSA infections. Test performance is not FDA approved in patients less than 8 years old. Performed at [facility], [place], [place] [address]   Respiratory (~20 pathogens) panel by PCR     Status: None   Collection Time: 02/06/21 10:00 AM   Specimen: Nasopharyngeal Swab; Respiratory  Result Value Ref Range Status   Adenovirus NOT DETECTED NOT DETECTED Final   Coronavirus 229E NOT DETECTED NOT DETECTED Final    Comment: (NOTE) The Coronavirus on the Respiratory Panel, DOES NOT test for the novel  Coronavirus (2019 nCoV)    Coronavirus HKU1 NOT DETECTED NOT DETECTED Final   Coronavirus NL63 NOT DETECTED NOT DETECTED Final   Coronavirus OC43 NOT DETECTED NOT DETECTED Final   Metapneumovirus NOT DETECTED NOT DETECTED Final   Rhinovirus / Enterovirus NOT DETECTED NOT DETECTED Final   Influenza A NOT DETECTED NOT DETECTED Final   Influenza B NOT  DETECTED NOT DETECTED Final   Parainfluenza Virus 1 NOT DETECTED NOT DETECTED Final   Parainfluenza Virus 2 NOT DETECTED NOT DETECTED Final   Parainfluenza Virus 3 NOT DETECTED NOT DETECTED Final   Parainfluenza Virus 4 NOT DETECTED NOT DETECTED Final   Respiratory Syncytial Virus NOT DETECTED NOT DETECTED Final   Bordetella pertussis NOT DETECTED NOT DETECTED Final   Bordetella Parapertussis NOT DETECTED NOT DETECTED Final   Chlamydophila pneumoniae NOT DETECTED NOT DETECTED Final   Mycoplasma pneumoniae NOT DETECTED NOT DETECTED Final    Comment: Performed at [facility], [place] [address], [place], [place] [address]     Labs: Basic Metabolic Panel: Recent Labs  Lab 02/05/21 0529 02/06/21 0404 02/07/21 0413 02/08/21 0616 02/09/21 0639  NA 138 144 144 145 141  K 3.4* 3.9 3.9 3.3* 3.7  CL 105 108 109 106 107  CO2 27 30 31  32 27  GLUCOSE 187* 101* 111* 110* 106*  BUN 9 10 10 8 11   CREATININE 0.76 0.73 0.73 0.79 0.78  CALCIUM 8.3* 8.5* 8.4* 8.5* 8.6*  MG  --   --   --   --  2.5*   Liver Function Tests: Recent Labs  Lab 02/05/21 0529  AST 47*  ALT 59*  ALKPHOS 67  BILITOT 2.4*  PROT 7.1  ALBUMIN 3.6   Recent Labs  Lab  02/05/21 0529  LIPASE 27   No results for input(s): AMMONIA in the last 168 hours. CBC: Recent Labs  Lab 02/05/21 0529 02/06/21 0404 02/07/21 0413 02/08/21 0616  WBC 8.9 7.5 7.1 6.4  NEUTROABS 7.0  --   --   --   HGB 15.0 14.4 14.3 13.6  HCT 45.5 43.9 43.6 41.9  MCV 98.5 100.0 98.9 97.9  PLT 173 133* 179 170   Cardiac Enzymes: No results for input(s): CKTOTAL, CKMB, CKMBINDEX, TROPONINI in the last 168 hours. BNP: BNP (last 3 results) Recent Labs    02/05/21 0529  BNP 104.6*    ProBNP (last 3 results) No results for input(s): PROBNP in the last 8760 hours.  CBG: No results for input(s): GLUCAP in the last 168 hours.     Signed:  Desma Maxim MD.  [facility] 02/09/2021, 1:19 PM

## 2021-02-09 NOTE — Progress Notes (Signed)
PROGRESS NOTE    James [name]  LKG:[id number] DOB: 06-21-1965 DOA: 02/05/2021 PCP: Leone Haven, MD  Outpatient Specialists: neurology, cardiology, pulmonology, SLP, GI    Brief Narrative:   From admission h and p James [name] is a 55 y.o. male with medical history significant for muscular dystrophy, dysphagia, GERD, sleep apnea, cardiomyopathy status post ICD placement who presents to the ER for evaluation of shortness of breath. Patient's wife states that 2 days prior to his admission he developed nausea and had multiple episodes of emesis.  She said he usually has digestive symptoms and this is not unusual for him.  He also had a choking spell which he normally gets and she was afraid he may have aspirated.  For the last 2 days he has had a cough that is nonproductive associated with shortness of breath and hoarseness.  He denies having any fever or chills and denies having any chest pain.  She checked his pulse oximetry on room air at rest and it was in the 70s so she brought him to the ER for further evaluation. Patient does not wear home oxygen at baseline but has been discharged home on oxygen in the past after treatment for pneumonia. He denies having any palpitations, no diaphoresis, no headache, no changes in his bowel habits, no dizziness, no lightheadedness, no abdominal pain, no leg swelling, no urinary symptoms, no leg swelling, no blurred vision no focal deficit.   Assessment & Plan:   Principal Problem:   Acute respiratory failure (HCC) Active Problems:   Cardiomyopathy ([place])   Myotonic dystrophy, type 1 ([place])   Dysphagia   Hypokalemia   Aspiration pneumonia ([place])  # Aspiration pneumonitis # Acute hypoxic respiratory failure Hx dysphagia 2/2 myotonic dystrophy. Followed by GI and SLP as outpt. Had what appears to be aspiration event at home. CT w/o infiltrate. O2 80s, requiring 2 L. Stable on that, no respiratory distress. Has been evaluated by speech here  and cleared to eat, his dysphagia seems to be at its baseline. Underlying myotonic dystrophy main underlying cause. O2 weaned off a little while ago, currently 93% and comfortable on room air - pulm following, appreciate recs. Continue metanebs, IS - urine antigens neg - non-productive rare cough, no sputum has been obtained - continuing unasyn (started 11/11), pulm advises continuing for now - monitor o2 - OT advises home health OT; pt consult pending - will touch base w/ pulm about discharge planning  # Cardiomyopathy S/p crt-d placement. Appears asymptomatic, euvolemic  # Myotonic dystrophy At baseline, followed closely by neurology  # OSA - bipap qhs  # Thrombocytopenia Resolved  - monitor, further w/u if persists    DVT prophylaxis: lovenox Code Status: dnr Family Communication: wife updated @ bedside 11/15  Level of care: Telemetry Medical Status is: Inpatient  Remains inpatient appropriate because: severity of illness        Consultants:  pulmonology  Procedures: none  Antimicrobials:  Unasyn 11/11>    Subjective: Reports feeling improved w/ more energy. Tolerating diet. Breathing comfortably. No pain.  Objective: Vitals:   02/08/21 1937 02/09/21 0605 02/09/21 0715 02/09/21 0754  BP:  96/69  103/70  Pulse:  78 78 72  Resp:  16 16 16   Temp:  (!) 97.5 F (36.4 C)  97.7 F (36.5 C)  TempSrc:  Oral  Oral  SpO2: 97% 96% 91% 93%  Weight:      Height:        Intake/Output Summary (Last 24  hours) at 02/09/2021 1200 Last data filed at 02/09/2021 0950 Gross per 24 hour  Intake 1778.68 ml  Output 0 ml  Net 1778.68 ml    Filed Weights   02/05/21 0517  Weight: 91.2 kg    Examination:  General exam: Appears calm and comfortable  Respiratory system: rales at bases bilaterally Cardiovascular system: S1 & S2 heard, RRR. No JVD, murmurs, rubs, gallops or clicks. No pedal edema. Gastrointestinal system: Abdomen is nondistended, soft and  nontender. No organomegaly or masses felt. Normal bowel sounds heard. Central nervous system: Alert and oriented. No focal neurological deficits. Extremities: Symmetric 5 x 5 power. Skin: No rashes, lesions or ulcers Psychiatry: flat affect MSK: decreased tone    Data Reviewed: I have personally reviewed following labs and imaging studies  CBC: Recent Labs  Lab 02/05/21 0529 02/06/21 0404 02/07/21 0413 02/08/21 0616  WBC 8.9 7.5 7.1 6.4  NEUTROABS 7.0  --   --   --   HGB 15.0 14.4 14.3 13.6  HCT 45.5 43.9 43.6 41.9  MCV 98.5 100.0 98.9 97.9  PLT 173 133* 179 664   Basic Metabolic Panel: Recent Labs  Lab 02/05/21 0529 02/06/21 0404 02/07/21 0413 02/08/21 0616 02/09/21 0639  NA 138 144 144 145 141  K 3.4* 3.9 3.9 3.3* 3.7  CL 105 108 109 106 107  CO2 27 30 31  32 27  GLUCOSE 187* 101* 111* 110* 106*  BUN 9 10 10 8 11   CREATININE 0.76 0.73 0.73 0.79 0.78  CALCIUM 8.3* 8.5* 8.4* 8.5* 8.6*  MG  --   --   --   --  2.5*   GFR: Estimated Creatinine Clearance: 120.6 mL/min (by C-G formula based on SCr of 0.78 mg/dL). Liver Function Tests: Recent Labs  Lab 02/05/21 0529  AST 47*  ALT 59*  ALKPHOS 67  BILITOT 2.4*  PROT 7.1  ALBUMIN 3.6   Recent Labs  Lab 02/05/21 0529  LIPASE 27   No results for input(s): AMMONIA in the last 168 hours. Coagulation Profile: No results for input(s): INR, PROTIME in the last 168 hours. Cardiac Enzymes: No results for input(s): CKTOTAL, CKMB, CKMBINDEX, TROPONINI in the last 168 hours. BNP (last 3 results) No results for input(s): PROBNP in the last 8760 hours. HbA1C: No results for input(s): HGBA1C in the last 72 hours. CBG: No results for input(s): GLUCAP in the last 168 hours. Lipid Profile: No results for input(s): CHOL, HDL, LDLCALC, TRIG, CHOLHDL, LDLDIRECT in the last 72 hours. Thyroid Function Tests: No results for input(s): TSH, T4TOTAL, FREET4, T3FREE, THYROIDAB in the last 72 hours. Anemia Panel: No results for  input(s): VITAMINB12, FOLATE, FERRITIN, TIBC, IRON, RETICCTPCT in the last 72 hours. Urine analysis:    Component Value Date/Time   COLORURINE YELLOW (A) 06/29/2020 1603   APPEARANCEUR CLEAR (A) 06/29/2020 1603   APPEARANCEUR Clear 01/23/2014 1256   LABSPEC >1.046 (H) 06/29/2020 1603   LABSPEC 1.006 01/23/2014 1256   PHURINE 6.0 06/29/2020 1603   GLUCOSEU NEGATIVE 06/29/2020 1603   GLUCOSEU Negative 01/23/2014 1256   HGBUR NEGATIVE 06/29/2020 1603   BILIRUBINUR NEGATIVE 06/29/2020 1603   BILIRUBINUR Negative 01/23/2014 [place] 06/29/2020 1603   PROTEINUR NEGATIVE 06/29/2020 1603   NITRITE NEGATIVE 06/29/2020 1603   LEUKOCYTESUR NEGATIVE 06/29/2020 1603   LEUKOCYTESUR Negative 01/23/2014 1256   Sepsis Labs: @LABRCNTIP (procalcitonin:4,lacticidven:4)  ) Recent Results (from the past 240 hour(s))  Resp Panel by RT-PCR (Flu A&B, Covid) Nasopharyngeal Swab     Status: None  Collection Time: 02/05/21  5:27 AM   Specimen: Nasopharyngeal Swab; Nasopharyngeal(NP) swabs in vial transport medium  Result Value Ref Range Status   SARS Coronavirus 2 by RT PCR NEGATIVE NEGATIVE Final    Comment: (NOTE) SARS-CoV-2 target nucleic acids are NOT DETECTED.  The SARS-CoV-2 RNA is generally detectable in upper respiratory specimens during the acute phase of infection. The lowest concentration of SARS-CoV-2 viral copies this assay can detect is 138 copies/mL. A negative result does not preclude SARS-Cov-2 infection and should not be used as the sole basis for treatment or other patient management decisions. A negative result may occur with  improper specimen collection/handling, submission of specimen other than nasopharyngeal swab, presence of viral mutation(s) within the areas targeted by this assay, and inadequate number of viral copies(<138 copies/mL). A negative result must be combined with clinical observations, patient history, and epidemiological information. The  expected result is Negative.  Fact Sheet for Patients:  EntrepreneurPulse.com.au  Fact Sheet for Healthcare Providers:  IncredibleEmployment.be  This test is no t yet approved or cleared by the [name] FDA and  has been authorized for detection and/or diagnosis of SARS-CoV-2 by FDA under an Emergency Use Authorization (EUA). This EUA will remain  in effect (meaning this test can be used) for the duration of the COVID-19 declaration under Section 564(b)(1) of the Act, 21 U.S.C.section 360bbb-3(b)(1), unless the authorization is terminated  or revoked sooner.       Influenza A by PCR NEGATIVE NEGATIVE Final   Influenza B by PCR NEGATIVE NEGATIVE Final    Comment: (NOTE) The Xpert Xpress SARS-CoV-2/FLU/RSV plus assay is intended as an aid in the diagnosis of influenza from Nasopharyngeal swab specimens and should not be used as a sole basis for treatment. Nasal washings and aspirates are unacceptable for Xpert Xpress SARS-CoV-2/FLU/RSV testing.  Fact Sheet for Patients: EntrepreneurPulse.com.au  Fact Sheet for Healthcare Providers: IncredibleEmployment.be  This test is not yet approved or cleared by the [name] FDA and has been authorized for detection and/or diagnosis of SARS-CoV-2 by FDA under an Emergency Use Authorization (EUA). This EUA will remain in effect (meaning this test can be used) for the duration of the COVID-19 declaration under Section 564(b)(1) of the Act, 21 U.S.C. section 360bbb-3(b)(1), unless the authorization is terminated or revoked.  Performed at [facility], [address] [place], [place] [address]   Blood culture (routine x 2)     Status: None (Preliminary result)   Collection Time: 02/05/21  6:00 AM   Specimen: BLOOD  Result Value Ref Range Status   Specimen Description BLOOD RIGHT HAND  Final   Special Requests   Final    BOTTLES DRAWN AEROBIC AND  ANAEROBIC Blood Culture adequate volume   Culture   Final    NO GROWTH 3 DAYS Performed at [facility], [address] [place], [place] [address]    Report Status PENDING  Incomplete  Blood culture (routine x 2)     Status: None (Preliminary result)   Collection Time: 02/05/21  6:05 AM   Specimen: BLOOD  Result Value Ref Range Status   Specimen Description BLOOD LEFT HAND  Final   Special Requests   Final    BOTTLES DRAWN AEROBIC AND ANAEROBIC Blood Culture results may not be optimal due to an inadequate volume of blood received in culture bottles   Culture   Final    NO GROWTH 3 DAYS Performed at [facility], [address] [place], [place] [address]  Report Status PENDING  Incomplete  MRSA Next Gen by PCR, Nasal     Status: None   Collection Time: 02/05/21 12:44 PM   Specimen: Nasal Mucosa; Nasal Swab  Result Value Ref Range Status   MRSA by PCR Next Gen NOT DETECTED NOT DETECTED Final    Comment: (NOTE) The GeneXpert MRSA Assay (FDA approved for NASAL specimens only), is one component of a comprehensive MRSA colonization surveillance program. It is not intended to diagnose MRSA infection nor to guide or monitor treatment for MRSA infections. Test performance is not FDA approved in patients less than 81 years old. Performed at [facility], [place], [place] [address]   Respiratory (~20 pathogens) panel by PCR     Status: None   Collection Time: 02/06/21 10:00 AM   Specimen: Nasopharyngeal Swab; Respiratory  Result Value Ref Range Status   Adenovirus NOT DETECTED NOT DETECTED Final   Coronavirus 229E NOT DETECTED NOT DETECTED Final    Comment: (NOTE) The Coronavirus on the Respiratory Panel, DOES NOT test for the novel  Coronavirus (2019 nCoV)    Coronavirus HKU1 NOT DETECTED NOT DETECTED Final   Coronavirus NL63 NOT DETECTED NOT DETECTED Final   Coronavirus OC43 NOT DETECTED NOT DETECTED Final   Metapneumovirus NOT  DETECTED NOT DETECTED Final   Rhinovirus / Enterovirus NOT DETECTED NOT DETECTED Final   Influenza A NOT DETECTED NOT DETECTED Final   Influenza B NOT DETECTED NOT DETECTED Final   Parainfluenza Virus 1 NOT DETECTED NOT DETECTED Final   Parainfluenza Virus 2 NOT DETECTED NOT DETECTED Final   Parainfluenza Virus 3 NOT DETECTED NOT DETECTED Final   Parainfluenza Virus 4 NOT DETECTED NOT DETECTED Final   Respiratory Syncytial Virus NOT DETECTED NOT DETECTED Final   Bordetella pertussis NOT DETECTED NOT DETECTED Final   Bordetella Parapertussis NOT DETECTED NOT DETECTED Final   Chlamydophila pneumoniae NOT DETECTED NOT DETECTED Final   Mycoplasma pneumoniae NOT DETECTED NOT DETECTED Final    Comment: Performed at [facility], [place]. [address], [place], [place] [address]         Radiology Studies: No results found.      Scheduled Meds:  docusate sodium  100 mg Oral Daily   enoxaparin (LOVENOX) injection  40 mg Subcutaneous Q24H   ipratropium-albuterol  3 mL Nebulization TID   multivitamin with minerals  1 tablet Oral Daily   sodium chloride flush  3 mL Intravenous Q12H   Continuous Infusions:  sodium chloride     ampicillin-sulbactam (UNASYN) IV 3 g (02/09/21 0901)     LOS: 4 days    Time spent: 20 min    Desma Maxim, MD [facility]   If 7PM-7AM, please contact night-coverage [email or web address] Password [email or web address] 02/09/2021, 12:00 PM

## 2021-02-09 NOTE — Progress Notes (Signed)
Pulmonary Medicine          Date: 02/09/2021,   MRN# [id number] James [name] [birth year]     AdmissionWeight: 91.2 kg                 CurrentWeight: 91.2 kg   Referring physician: Dr Francine Graven   CHIEF COMPLAINT:   Acute on chronic hypoxemic respiratory failure in context of muscular dystrophy   HISTORY OF PRESENT ILLNESS   This is a pleasant 55yo M with hs of MD, GERD, OSA, Cardiomyopathy s/p ICD came in with SOB x2 d.  Wife of patient reported NV and signs of aspiration with regurgitation. She noted acute hypoxemia at home <80%. Labs show sodium 138, potassium 3.4, chloride 105, bicarb 27, glucose 187, BUN 9, creatinine 0.76, calcium 8.3, alkaline phosphatase 67, albumin 3.6, lipase 27, AST 47, ALT 59, total protein 7.1, BNP 104, troponin 8, lactic acid 1.3, procalcitonin less than 0.10, white count 8.9, hemoglobin 15.0, hematocrit 45.5, MCV 98.5, RDW 13.1, platelet count 173. CT angio was done and is negative for PE but + for bibasilar atelectasis and possible pneumonia with cardiomegaly.PCCM consultation for further evaluation and management.    02/07/21- patient s/p BIPAP overnight, repeat spirometry with current FEV1 at 0.8L 20% predicted. Repeat CXR today. BMP and CBC stable. Infectious workup negative thus fur, CRP substantially elevated indicative of possible inflammatory reaction to remote viral prdrome in lieu of low Procal.  Continue to work with RT for BPH via [place]. Patient is in no distress.  He is on mucomysit and is using it well but not expecotrating much debris.   02/08/21- Patient is improved he is down to 2L/min.  Repeat CXR is with bibasilar atelectasis. He is on Mucomyst and Metaneb with slight improvement.  We will stop mucomyst today. He is very deconditioned  and has not had PT/OT yet.   02/09/21- patient is stable , while on room air he saturates 93% with ambulation it does drop to 87 for <10sec. Plan for dc home with O2 and close follow up within 7d  in clinic.   PAST MEDICAL HISTORY   Past Medical History:  Diagnosis Date  . Bronchitis   . Cardiomyopathy ([place])   . Dysphagia   . GERD (gastroesophageal reflux disease)   . History of chicken pox   . History of pneumonia 2013   [facility] PNA at [facility]  . LBBB (left bundle branch block)   . Myotonic dystrophy, type 1 ([place])    followed by [facility]  . Pneumonia    aspiration  . Positive colorectal cancer screening using Cologuard test 03/31/2017  . Sleep apnea      SURGICAL HISTORY   Past Surgical History:  Procedure Laterality Date  . CARDIAC DEFIBRILLATOR PLACEMENT     defib/pacer  . CARDIAC DEFIBRILLATOR PLACEMENT    . COLONOSCOPY N/A 07/01/2020   Procedure: COLONOSCOPY;  Surgeon: Virgel Manifold, MD;  Location: [facility] ENDOSCOPY;  Service: Endoscopy;  Laterality: N/A;  . COLONOSCOPY WITH PROPOFOL N/A 08/16/2016   Procedure: COLONOSCOPY WITH PROPOFOL;  Surgeon: Jonathon Bellows, MD;  [facility];  Service: Gastroenterology;  Laterality: N/A;  . COLONOSCOPY WITH PROPOFOL N/A 04/04/2017   Procedure: COLONOSCOPY WITH PROPOFOL;  Surgeon: Jonathon Bellows, MD;  Location: [facility] ENDOSCOPY;  Service: Gastroenterology;  Laterality: N/A;  . PACEMAKER IMPLANT    . US ECHOCARDIOGRAPHY  09/2010   EF 50%, mld LVH and dysfunction, mild RV dysfunction and enlarged     FAMILY HISTORY  Family History  Problem Relation Age of Onset  . Hypertension Father   . Cancer Paternal Grandfather        lung, smoker  . Cancer Paternal Uncle        bone  . Diabetes Mother   . CAD Mother 98       MI  . Stroke Neg Hx      SOCIAL HISTORY   Social History   Tobacco Use  . Smoking status: Never  . Smokeless tobacco: Never  Vaping Use  . Vaping Use: Never used  Substance Use Topics  . Alcohol use: No  . Drug use: No     MEDICATIONS    Home Medication:    Current Medication:  Current Facility-Administered Medications:  .  0.9 %  sodium chloride infusion, 250 mL, Intravenous, PRN,  Agbata, Tochukwu, MD .  acetaminophen (TYLENOL) tablet 500-1,000 mg, 500-1,000 mg, Oral, Q6H PRN, Agbata, Tochukwu, MD .  albuterol (PROVENTIL) (2.5 MG/3ML) 0.083% nebulizer solution 2.5 mg, 2.5 mg, Nebulization, Q6H PRN, Agbata, Tochukwu, MD, 2.5 mg at 02/06/21 0734 .  Ampicillin-Sulbactam (UNASYN) 3 g in sodium chloride 0.9 % 100 mL IVPB, 3 g, Intravenous, Q6H, Dorothe Pea, RPH, Last Rate: 200 mL/hr at 02/09/21 0901, 3 g at 02/09/21 0901 .  docusate sodium (COLACE) capsule 100 mg, 100 mg, Oral, Daily, Agbata, Tochukwu, MD, 100 mg at 02/09/21 0855 .  enoxaparin (LOVENOX) injection 40 mg, 40 mg, Subcutaneous, Q24H, Agbata, Tochukwu, MD, 40 mg at 02/09/21 0855 .  ipratropium-albuterol (DUONEB) 0.5-2.5 (3) MG/3ML nebulizer solution 3 mL, 3 mL, Nebulization, TID, Wouk, Ailene Rud, MD, 3 mL at 02/09/21 0715 .  multivitamin with minerals tablet 1 tablet, 1 tablet, Oral, Daily, Agbata, Tochukwu, MD, 1 tablet at 02/09/21 0855 .  ondansetron (ZOFRAN) tablet 4 mg, 4 mg, Oral, Q6H PRN **OR** ondansetron (ZOFRAN) injection 4 mg, 4 mg, Intravenous, Q6H PRN, Agbata, Tochukwu, MD .  sodium chloride flush (NS) 0.9 % injection 3 mL, 3 mL, Intravenous, Q12H, Agbata, Tochukwu, MD, 3 mL at 02/09/21 0902 .  sodium chloride flush (NS) 0.9 % injection 3 mL, 3 mL, Intravenous, PRN, Agbata, Tochukwu, MD    ALLERGIES   Codeine     REVIEW OF SYSTEMS    Review of Systems:  Gen:  Denies  fever, sweats, chills weigh loss  HEENT: Denies blurred vision, double vision, ear pain, eye pain, hearing loss, nose bleeds, sore throat Cardiac:  No dizziness, chest pain or heaviness, chest tightness,edema Resp:   Denies cough or sputum porduction, shortness of breath,wheezing, hemoptysis,  Gi: Denies swallowing difficulty, stomach pain, nausea or vomiting, diarrhea, constipation, bowel incontinence Gu:  Denies bladder incontinence, burning urine Ext:   Denies Joint pain, stiffness or swelling Skin: Denies  skin rash,  easy bruising or bleeding or hives Endoc:  Denies polyuria, polydipsia , polyphagia or weight change Psych:   Denies depression, insomnia or hallucinations   Other:  All other systems negative   VS: BP 103/70 (BP Location: Left Arm)   Pulse 72   Temp 97.7 F (36.5 C) (Oral)   Resp 16   Ht 5' 11"  (1.803 m)   Wt 91.2 kg   SpO2 93%   BMI 28.03 kg/m      PHYSICAL EXAM    GENERAL:NAD, no fevers, chills, no weakness no fatigue HEAD: Normocephalic, atraumatic.  EYES: Pupils equal, round, reactive to light. Extraocular muscles intact. No scleral icterus.  MOUTH: Moist mucosal membrane. Dentition intact. No abscess noted.  EAR, NOSE,  THROAT: Clear without exudates. No external lesions.  NECK: Supple. No thyromegaly. No nodules. No JVD.  PULMONARY: clear to auscultation bilateally with reduced breath sounds bilaterally  CARDIOVASCULAR: S1 and S2. Regular rate and rhythm. No murmurs, rubs, or gallops. No edema. Pedal pulses 2+ bilaterally.  GASTROINTESTINAL: Soft, nontender, nondistended. No masses. Positive bowel sounds. No hepatosplenomegaly.  MUSCULOSKELETAL: No swelling, clubbing, or edema. Range of motion full in all extremities.  NEUROLOGIC: Cranial nerves II through XII are intact. No gross focal neurological deficits. Sensation intact. Reflexes intact.  SKIN: No ulceration, lesions, rashes, or cyanosis. Skin warm and dry. Turgor intact.  PSYCHIATRIC: Mood, affect within normal limits. The patient is awake, alert and oriented x 3. Insight, judgment intact.       IMAGING    CT Angio Chest PE W and/or Wo Contrast  Result Date: 02/05/2021 CLINICAL DATA:  High probability suspicion for pulmonary embolus EXAM: CT ANGIOGRAPHY CHEST WITH CONTRAST TECHNIQUE: Multidetector CT imaging of the chest was performed using the standard protocol during bolus administration of intravenous contrast. Multiplanar CT image reconstructions and MIPs were obtained to evaluate the vascular anatomy.  CONTRAST:  28m OMNIPAQUE IOHEXOL 350 MG/ML SOLN COMPARISON:  Prior CT scan of the chest 10/06/2013 FINDINGS: Cardiovascular: Adequate opacification of the pulmonary arteries to the proximal segmental level. No evidence of acute pulmonary embolus. Three vessel arch with variant anatomy. The right brachiocephalic and left common carotid artery share a common origin while the left vertebral artery arises directly from the aorta. No evidence of aneurysm. Left subclavian approach cardiac rhythm maintenance device. Leads terminate in the right atrium, right ventricular apex and within a cardiac vein overlying the left ventricle. The heart is at the upper limits of normal for size. No pericardial effusion. Mediastinum/Nodes: Unremarkable CT appearance of the thyroid gland. No suspicious mediastinal or hilar adenopathy. No soft tissue mediastinal mass. The thoracic esophagus is unremarkable. Lungs/Pleura: Bilateral lower lobe atelectasis. No pulmonary edema or pleural effusion. Upper Abdomen: Calcified stones within the gallbladder neck. No gallbladder distension. No biliary ductal abnormality. Musculoskeletal: No acute fracture or aggressive appearing lytic or blastic osseous lesion. Review of the MIP images confirms the above findings. IMPRESSION: 1. Negative for acute pulmonary embolus, pneumonia or other acute cardiopulmonary process. 2. Bilateral lower lobe atelectasis suggest hypoventilation. 3. Biventricular cardiac rhythm maintenance device in place. 4. Cholelithiasis. Electronically Signed   By: HJacqulynn CadetM.D.   On: 02/05/2021 07:33   DG Chest Port 1 View  Result Date: 02/07/2021 CLINICAL DATA:  Shortness of breath. EXAM: PORTABLE CHEST 1 VIEW COMPARISON:  February 05, 2021 FINDINGS: The cardiomediastinal silhouette is unchanged in contour.LEFT-sided AICD. No pleural effusion. No pneumothorax. LEFT retrocardiac opacity, mildly increased in comparison to prior. RIGHT basilar linear opacities consistent  with atelectasis. Visualized abdomen is unremarkable. Multilevel degenerative changes of the thoracic spine. IMPRESSION: Increased LEFT basilar consolidative opacities, nonspecific. Persistent RIGHT basilar platelike opacities most consistent with atelectasis. Electronically Signed   By: SValentino SaxonM.D.   On: 02/07/2021 12:47   DG Chest Portable 1 View  Result Date: 02/05/2021 CLINICAL DATA:  Cough EXAM: PORTABLE CHEST 1 VIEW COMPARISON:  Prior chest x-ray 10/14/2019 FINDINGS: Left subclavian approach biventricular cardiac rhythm maintenance device. Leads project over the right atrium, right ventricle and overlying the left ventricle. Mild cardiomegaly. Extremely low inspiratory volumes with bibasilar airspace opacities. Linear opacity overlying the right lung base along the minor fissure suggests right middle lobe atelectasis. Central and bilateral lower lobes the central airway thickening and peribronchial  cuffing. No large effusion or pneumothorax. IMPRESSION: 1. Very low inspiratory volumes with bibasilar airspace opacities likely reflecting atelectasis. Superimposed infiltrate/pneumonia would be difficult to exclude, particularly on the left. 2. Right middle lobe atelectasis. 3. Biventricular cardiac rhythm maintenance device. Electronically Signed   By: Jacqulynn Cadet M.D.   On: 02/05/2021 05:46      ASSESSMENT/PLAN   Acute hypoxemic respiratory failure - present on admission  - COVID19 -negative   - supplemental O2 during my evaluation -3L/min  - will perform infectious workup for pneumonia -Respiratory viral panel-negative -serum fungitell-negative -legionella ab-negative -strep pneumoniae ur AG -Histoplasma Ur Ag -sputum resp cultures -AFB sputum expectorated specimen -sputum cytology  -reviewed pertinent imaging with patient today - ESR -PT/OT for d/c planning  -please encourage patient to use incentive spirometer few times each hour while hospitalized.     Chronic  cardiomyopathy s/p PPM AICD  Previously seen by [facility]  -repeat TTE -acute on chronic heart failure?  -BNP is >100 with obese body habitus   Bibasilar atelectasis  - Metaneb with albuterol Q8h   - mucomyst 36m BID neb with RT    - IS and flutter at bedside   - encourage to participate in bronchopulmonary hygiene  OSA on cpap   - QHS bipap while in house    - spirometry with graph due to muscular dystrophy - patient may need BIPAP long term    Thank you for allowing me to participate in the care of this patient.   Patient/Family are satisfied with care plan and all questions have been answered.  This document was prepared using Dragon voice recognition software and may include unintentional dictation errors.     FOttie Glazier M.D.  Division of [place]

## 2021-02-09 NOTE — Progress Notes (Signed)
Patient discharged to home via wheelchair accompanied by wife. No acute distress noted.  Patient discharged with all pertinent information, prescriptions, and personal belongings.  Patient able to teach back instructions, all questions answered.  IV site d/ced.  Equipment delivered to room.  Care relinquished.

## 2021-02-09 NOTE — TOC Initial Note (Signed)
Transition of Care [facility]) - Initial/Assessment Note    Patient Details  Name: James [name] MRN: [id number] Date of Birth: [birth year]  Transition of [facility]) CM/SW Contact:    Beverly Sessions, RN Phone Number: 02/09/2021, 1:39 PM  Clinical Narrative:                  Patient admitted from home with respiratory failure.  Patient lives at home with wife PCP sonnenberg - Patient states at times he can drive himself.  If not his wife is transports  Engineer, civil (consulting) - denies issues obtaining medications  Patient states that he has scooter, RW, and Power WC.  Patient to discharge with home O2.  Pulmonary made referral to [facility].  Confirmed with Zack with [facility] they have referral and portable will be delivered to room prior to discharge  Patient agreeable to home health PT and OT.  Patient and wife states that they do not have preference of home health agency.  Referral made to [facility] with Alvis Lemmings  Wife to transport at discharge    Expected Discharge Plan: [place] Services Barriers to Discharge: Barriers Resolved   Patient Goals and CMS Choice   CMS Medicare.gov Compare Post Acute Care list provided to:: Patient Choice offered to / list presented to : Spouse, Patient  Expected Discharge Plan and Services Expected Discharge Plan: [place]   Discharge Planning Services: CM Consult Post Acute Care Choice: Home Health, Durable Medical Equipment Living arrangements for the past 2 months: Single Family Home Expected Discharge Date: 02/09/21               DME Arranged: Oxygen DME Agency: [facility] Date DME Agency Contacted: 02/09/21   Representative spoke with at DME Agency: [place]: PT, OT          Prior Living Arrangements/Services Living arrangements for the past 2 months: [place] Lives with:: Spouse Patient language and need for interpreter reviewed:: Yes        Need for Family Participation in Patient Care: Yes  (Comment) Care giver support system in place?: Yes (comment) Current home services: DME Criminal Activity/Legal Involvement Pertinent to Current Situation/Hospitalization: No - Comment as needed  Activities of Daily Living Home Assistive Devices/Equipment: Environmental consultant (specify type), Other (Comment) (scooter, and power chair) ADL Screening (condition at time of admission) Patient's cognitive ability adequate to safely complete daily activities?: Yes Is the patient deaf or have difficulty hearing?: No Does the patient have difficulty seeing, even when wearing glasses/contacts?: No Does the patient have difficulty concentrating, remembering, or making decisions?: No Patient able to express need for assistance with ADLs?: Yes Does the patient have difficulty dressing or bathing?: Yes Independently performs ADLs?: No Communication: Independent Dressing (OT): Needs assistance Is this a change from baseline?: Pre-admission baseline Grooming: Needs assistance Is this a change from baseline?: Pre-admission baseline Feeding: Independent Bathing: Needs assistance Is this a change from baseline?: Pre-admission baseline Toileting: Needs assistance Is this a change from baseline?: Pre-admission baseline In/Out Bed: Independent Walks in Home: Needs assistance Is this a change from baseline?: Pre-admission baseline Does the patient have difficulty walking or climbing stairs?: No Weakness of Legs: None Weakness of Arms/Hands: None  Permission Sought/Granted                  Emotional Assessment       Orientation: : Oriented to Self, Oriented to Place, Oriented to  Time, Oriented to Situation Alcohol / Substance  Use: Not Applicable Psych Involvement: No (comment)  Admission diagnosis:  Aspiration pneumonia ([place]) [J69.0] Acute respiratory failure with hypoxia ([place]) [J96.01] Abnormal LFTs [R79.89] Patient Active Problem List   Diagnosis Date Noted   Aspiration pneumonia ([place]) 02/05/2021    Acute respiratory failure ([place]) 02/05/2021   Hypokalemia 07/06/2020   Polyp of colon    Colitis 06/29/2020   Chronic cough 11/22/2019   Numbness 02/16/2018   Constipation 02/16/2018   Avascular necrosis of hip, unspecified laterality ([place]) 11/08/2017   Fall 11/08/2017   PAF (paroxysmal atrial fibrillation) ([place]) 09/29/2017   ICD (implantable cardioverter-defibrillator) in place 09/23/2016   Prediabetes 12/01/2015   Heart palpitations 09/25/2015   Obstructive sleep apnea 06/26/2015   Rotator cuff tendinitis 05/22/2015   Dysphagia    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

## 2021-02-09 NOTE — Evaluation (Signed)
Physical Therapy Evaluation Patient Details Name: James [name] MRN: [id number] DOB: 1965/09/15 Today's Date: 02/09/2021  History of Present Illness  55yo M with hs of MD, GERD, OSA, Cardiomyopathy s/p ICD came in with SOB x2 d.  Wife of patient reported NV and signs of aspiration with regurgitation. She noted acute hypoxemia at home <80%.  Clinical Impression  Patient resting in bed upon arrival to room; alert and oriented, follows commands and agreeable to session; generally flat affect noted.  Wife present at bedside, supportive and encouraging.  Denies pain and does endorse some improvement in respiratory status since admission.  Bilat UE/LE strength and ROM grossly symmetrical and WFL for basic transfers and gait; no focal weakness appreciated.  Able to complete bed mobility with mod indep; sit/stand, basic transfers and gait (45' x2) with RW, cga/close sup.  Demonstrates forward flexed posture with mod WBing bilat UEs, narrowed BOS with decreased step height/length; slow and deliberate, but no overt buckling, LOB or safety concern.  Minimal SOB noted wtih exertion; brief desat to 87% on RA with gait, but quickly (within 10-15 seconds) >90-91% with seated rest and pursed lip breathing.  Left on RA end of session, resting 90-91% on RA.  RN/MD informed/aware. Would benefit from skilled PT to address above deficits and promote optimal return to PLOF.; Recommend transition to HHPT upon discharge from acute hospitalization.      Recommendations for follow up therapy are one component of a multi-disciplinary discharge planning process, led by the attending physician.  Recommendations may be updated based on patient status, additional functional criteria and insurance authorization.  Follow Up Recommendations Home health PT    Assistance Recommended at Discharge PRN  Functional Status Assessment Patient has had a recent decline in their functional status and demonstrates the ability to make  significant improvements in function in a reasonable and predictable amount of time.  Equipment Recommendations       Recommendations for Other Services       Precautions / Restrictions Precautions Precautions: Fall Restrictions Weight Bearing Restrictions: No      Mobility  Bed Mobility Overal bed mobility: Modified Independent Bed Mobility: Supine to Sit;Sit to Supine                Transfers Overall transfer level: Needs assistance Equipment used: Rolling walker (2 wheels) Transfers: Sit to/from Stand Sit to Stand: Min guard;Supervision;From elevated surface           General transfer comment: requires UE support to complete lift off, intermittently stabilizing with posterior surface of LEs against edge of bed    Ambulation/Gait Ambulation/Gait assistance: Min guard Gait Distance (Feet):  (86' x2) Assistive device: Rolling walker (2 wheels)         General Gait Details: forward flexed posture with mod WBing bilat UEs, narrowed BOS with decreased step height/length; slow and deliberate, but no overt buckling, LOB or safety concern.  Minimal SOB noted wtih exertion; brief desat to 87% on RA with gait, but quickly (within 10-15 seconds) >90-91% with seated rest and pursed lip breathing.  Stairs            Wheelchair Mobility    Modified Rankin (Stroke Patients Only)       Balance Overall balance assessment: Needs assistance Sitting-balance support: Feet supported;No upper extremity supported Sitting balance-Leahy Scale: Good     Standing balance support: Bilateral upper extremity supported Standing balance-Leahy Scale: Fair  Pertinent Vitals/Pain Pain Assessment: No/denies pain    Home Living Family/patient expects to be discharged to:: Private residence Living Arrangements: Spouse/significant other;Children Available Help at Discharge: Family Type of Home: House Home Access: Ramped entrance        Home Layout: One level Home Equipment: Conservation officer, nature (2 wheels);Cane - single point;BSC/3in1;Adaptive equipment;Grab bars - tub/shower;Grab bars - toilet;Hand held shower head;Toilet riser;Shower seat;Wheelchair - power;Electric scooter;Hospital bed;Other (comment) Additional Comments: Has wheelchair accessible van    Prior Function Prior Level of Function : Needs assist       Physical Assist : Mobility (physical) Mobility (physical): Transfers;Gait   Mobility Comments: SBA/CGA from spouse with RW for short HH distances; denies fall history within previous six months ADLs Comments: Spouse provides MIN-MOD A for bathing, dressing, toileting. Pt able to perform grooming and self feeding     Hand Dominance   Dominant Hand: Right    Extremity/Trunk Assessment   Upper Extremity Assessment Upper Extremity Assessment: Generalized weakness    Lower Extremity Assessment Lower Extremity Assessment: Generalized weakness (grossly at least 4-/5 throughout, no focal weakness appreciated; denies sensory deficit)       Communication   Communication: No difficulties  Cognition Arousal/Alertness: Awake/alert Behavior During Therapy: Flat affect Overall Cognitive Status: Within Functional Limits for tasks assessed                                          General Comments      Exercises Other Exercises Other Exercises: Educated in role of PT and progressive mobility, use of RW and safety with transfers/gait, pursed lip breathing, postural extension and pulmonary hygiene techniques; patient/wife voiced understanding.   Assessment/Plan    PT Assessment Patient needs continued PT services  PT Problem List Decreased strength;Decreased activity tolerance;Decreased balance;Decreased mobility;Decreased knowledge of use of DME;Decreased safety awareness;Decreased knowledge of precautions;Cardiopulmonary status limiting activity       PT Treatment Interventions DME  instruction;Gait training;Functional mobility training;Balance training;Patient/family education;Therapeutic exercise;Therapeutic activities;Neuromuscular re-education    PT Goals (Current goals can be found in the Care Plan section)  Acute Rehab PT Goals Patient Stated Goal: to go home without oxygen! PT Goal Formulation: With patient/family Time For Goal Achievement: 02/23/21 Potential to Achieve Goals: Good    Frequency Min 2X/week   Barriers to discharge        Co-evaluation               AM-PAC PT "6 Clicks" Mobility  Outcome Measure Help needed turning from your back to your side while in a flat bed without using bedrails?: None Help needed moving from lying on your back to sitting on the side of a flat bed without using bedrails?: None Help needed moving to and from a bed to a chair (including a wheelchair)?: A Little Help needed standing up from a chair using your arms (e.g., wheelchair or bedside chair)?: A Little Help needed to walk in hospital room?: A Little Help needed climbing 3-5 steps with a railing? : A Little 6 Click Score: 20    End of Session Equipment Utilized During Treatment: Gait belt Activity Tolerance: Patient tolerated treatment well Patient left: in bed;with call bell/phone within reach;with family/visitor present Nurse Communication: Mobility status PT Visit Diagnosis: Muscle weakness (generalized) (M62.81);Difficulty in walking, not elsewhere classified (R26.2)    Time: 9937-1696 PT Time Calculation (min) (ACUTE ONLY): 28 min   Charges:  PT Evaluation $PT Eval Moderate Complexity: 1 Mod PT Treatments $Therapeutic Activity: 8-22 mins       Bonna Steury H. Owens Shark, PT, DPT, NCS 02/09/21, 2:07 PM [phone]

## 2021-02-09 NOTE — Progress Notes (Signed)
SATURATION QUALIFICATIONS:   Patient Saturations on Room Air at Rest = 93%  Patient Saturations on Room Air while Ambulating = 87%  Patient Saturations on 1 Liters of oxygen while Ambulating = 96%  Patient complains of shortness of breath while ambulating on room air.

## 2021-02-09 NOTE — Care Management Important Message (Signed)
Important Message  Patient Details  Name: James [name] MRN: [id number] Date of Birth: January 03, 1966   Medicare Important Message Given:  Yes     Dannette Barbara 02/09/2021, 1:16 PM

## 2021-02-10 LAB — CULTURE, BLOOD (ROUTINE X 2)
Culture: NO GROWTH
Culture: NO GROWTH
Special Requests: ADEQUATE

## 2021-02-11 ENCOUNTER — Telehealth: Payer: Self-pay

## 2021-02-11 NOTE — Telephone Encounter (Signed)
Transition Care Management Unsuccessful Follow-up Telephone Call  Date of discharge and from where:  02/09/21-[facility]  Attempts:  1st Attempt  Reason for unsuccessful TCM follow-up call:  Left voice message.  Patient to follow up with pulmonary.

## 2021-02-12 NOTE — Telephone Encounter (Signed)
Transition Care Management Unsuccessful Follow-up Telephone Call  Date of discharge and from where:  02/09/21-[facility]  Attempts:  2nd Attempt  Reason for unsuccessful TCM follow-up call:  Unable to reach patient    

## 2021-02-15 NOTE — Telephone Encounter (Signed)
Transition Care Management Unsuccessful Follow-up Telephone Call  Date of discharge and from where:  02/09/21-[facility]  Attempts:  3rd Attempt  Reason for unsuccessful TCM follow-up call:  Unable to leave message.No further outreach form Nurse Health Advisor at this time. No TCM appointment scheduled. Name provided to Transition of Care list for follow up with RN.

## 2021-02-16 DIAGNOSIS — I420 Dilated cardiomyopathy: Secondary | ICD-10-CM | POA: Diagnosis not present

## 2021-02-16 DIAGNOSIS — G4733 Obstructive sleep apnea (adult) (pediatric): Secondary | ICD-10-CM | POA: Diagnosis not present

## 2021-02-16 DIAGNOSIS — J690 Pneumonitis due to inhalation of food and vomit: Secondary | ICD-10-CM | POA: Diagnosis not present

## 2021-02-16 DIAGNOSIS — G7111 Myotonic muscular dystrophy: Secondary | ICD-10-CM | POA: Diagnosis not present

## 2021-02-16 DIAGNOSIS — J189 Pneumonia, unspecified organism: Secondary | ICD-10-CM | POA: Diagnosis not present

## 2021-03-03 ENCOUNTER — Telehealth: Payer: Self-pay | Admitting: Family Medicine

## 2021-03-03 ENCOUNTER — Telehealth: Payer: Self-pay

## 2021-03-04 NOTE — Telephone Encounter (Signed)
I will plan to order this for the patient when I speak with him next week. I need details from him prior to being able to place this order.

## 2021-03-08 ENCOUNTER — Encounter: Payer: Self-pay | Admitting: Family Medicine

## 2021-03-08 ENCOUNTER — Other Ambulatory Visit: Payer: Self-pay

## 2021-03-08 ENCOUNTER — Telehealth (INDEPENDENT_AMBULATORY_CARE_PROVIDER_SITE_OTHER): Payer: Medicare HMO | Admitting: Family Medicine

## 2021-03-08 DIAGNOSIS — G4733 Obstructive sleep apnea (adult) (pediatric): Secondary | ICD-10-CM | POA: Diagnosis not present

## 2021-03-08 DIAGNOSIS — J690 Pneumonitis due to inhalation of food and vomit: Secondary | ICD-10-CM | POA: Diagnosis not present

## 2021-03-08 NOTE — Assessment & Plan Note (Signed)
The patient has had recurrent issues with aspiration.  We will order a modified barium swallow study.  He will continue to see speech therapy.

## 2021-03-08 NOTE — Progress Notes (Signed)
Virtual Visit via telephone Note  This visit type was conducted due to national recommendations for restrictions regarding the COVID-19 pandemic (e.g. social distancing).  This format is felt to be most appropriate for this patient at this time.  All issues noted in this document were discussed and addressed.  No physical exam was performed (except for noted visual exam findings with Video Visits).   I connected with James [name] today at  3:15 PM EST by telephone and verified that I am speaking with the correct person using two identifiers. Location patient: home Location provider: work  Persons participating in the virtual visit: patient, provider  I discussed the limitations, risks, security and privacy concerns of performing an evaluation and management service by telephone and the availability of in person appointments. I also discussed with the patient that there may be a patient responsible charge related to this service. The patient expressed understanding and agreed to proceed.  Interactive audio and video telecommunications were attempted between this provider and patient, however failed, due to patient having technical difficulties OR patient did not have access to video capability.  We continued and completed visit with audio only.   Reason for visit: f/u  HPI: Recurrent aspiration pneumonia: Patient was hospitalized again with aspiration pneumonia.  He has recurrent issues with aspiration and his speech therapist has requested him to have a modified barium swallow study.  He notes no current cough or shortness of breath.  No fevers.  He does occasionally it feel like he regurgitates.  He does occasionally cough after eating.  OSA: Patient is on BiPAP.  He wakes up well rested.  Some hypersomnia during the day.  He is supposed to be on 1 L of nasal cannula oxygen with the BiPAP though he has a hard time wearing the nasal cannula with the BiPAP.   ROS: See pertinent positives and  negatives per HPI.  Past Medical History:  Diagnosis Date   Bronchitis    Cardiomyopathy ([place])    Dysphagia    GERD (gastroesophageal reflux disease)    History of chicken pox    History of pneumonia 2013   [facility] PNA at [facility]   LBBB (left bundle branch block)    Myotonic dystrophy, type 1 ([place])    followed by [facility]   Pneumonia    aspiration   Positive colorectal cancer screening using Cologuard test 03/31/2017   Sleep apnea     Past Surgical History:  Procedure Laterality Date   CARDIAC DEFIBRILLATOR PLACEMENT     defib/pacer   CARDIAC DEFIBRILLATOR PLACEMENT     COLONOSCOPY N/A 07/01/2020   Procedure: COLONOSCOPY;  Surgeon: Virgel Manifold, MD;  Location: [facility] ENDOSCOPY;  Service: Endoscopy;  Laterality: N/A;   COLONOSCOPY WITH PROPOFOL N/A 08/16/2016   Procedure: COLONOSCOPY WITH PROPOFOL;  Surgeon: Jonathon Bellows, MD;  Location: [facility] ENDOSCOPY;  Service: Gastroenterology;  Laterality: N/A;   COLONOSCOPY WITH PROPOFOL N/A 04/04/2017   Procedure: COLONOSCOPY WITH PROPOFOL;  Surgeon: Jonathon Bellows, MD;  Location: [facility] ENDOSCOPY;  Service: Gastroenterology;  Laterality: N/A;   PACEMAKER IMPLANT     US ECHOCARDIOGRAPHY  09/2010   EF 50%, mld LVH and dysfunction, mild RV dysfunction and enlarged    Family History  Problem Relation Age of Onset   Hypertension Father    Cancer Paternal Grandfather        lung, smoker   Cancer Paternal Uncle        bone   Diabetes Mother    CAD Mother  88       MI   Stroke Neg Hx     SOCIAL HX: Non-smoker   Current Outpatient Medications:    acetaminophen (TYLENOL) 500 MG tablet, Take 500-1,000 mg by mouth every 6 (six) hours as needed for moderate pain or headache., Disp: , Rfl:    albuterol (PROVENTIL) (2.5 MG/3ML) 0.083% nebulizer solution, Take 3 mLs (2.5 mg total) by nebulization every 6 (six) hours as needed for wheezing or shortness of breath., Disp: 150 mL, Rfl: 1   amoxicillin-clavulanate (AUGMENTIN) 875-125 MG tablet, Take 1 tablet by  mouth 2 (two) times daily., Disp: 5 tablet, Rfl: 0   docusate sodium (COLACE) 100 MG capsule, Take 100 mg by mouth daily as needed for mild constipation., Disp: , Rfl:    Multiple Vitamin (MULTIVITAMIN) tablet, Take 1 tablet by mouth daily., Disp: , Rfl:    Ondansetron HCl (ZOFRAN PO), Take 1 tablet by mouth every 8 (eight) hours as needed (nausea/vomiting)., Disp: , Rfl:   EXAM: This was a telephone visit and thus no exam was completed.  ASSESSMENT AND PLAN:  Discussed the following assessment and plan:  Problem List Items Addressed This Visit     Aspiration pneumonia [facility])    The patient has had recurrent issues with aspiration.  We will order a modified barium swallow study.  He will continue to see speech therapy.      Relevant Orders   SLP modified barium swallow   Obstructive sleep apnea    He will continue his BiPAP.  I will have my CMA reach out to the home health company to assist the patient in attaching his nocturnal oxygen to his BiPAP.       Return in about 3 months (around 06/06/2021).   I discussed the assessment and treatment plan with the patient. The patient was provided an opportunity to ask questions and all were answered. The patient agreed with the plan and demonstrated an understanding of the instructions.   The patient was advised to call back or seek an in-person evaluation if the symptoms worsen or if the condition fails to improve as anticipated.  I provided 7 minutes of non-face-to-face time during this encounter.   [name], MD

## 2021-03-08 NOTE — Assessment & Plan Note (Signed)
He will continue his BiPAP.  I will have my CMA reach out to the home health company to assist the patient in attaching his nocturnal oxygen to his BiPAP.

## 2021-03-09 NOTE — Telephone Encounter (Signed)
Patient was seen on 12/12 at a virtual appointment.  [name],cma

## 2021-03-23 NOTE — Progress Notes (Signed)
I called [employer] and they stated that the patient had to call and order due to [place]. I called the patient and gave him the phone number to call and order [phone] and he is to order Oxygen Line tubing. The patient understood.   Humberto Addo,cma

## 2021-04-11 IMAGING — CT CT ABD-PELV W/ CM
2 of 5 series · 15 of 46 positions shown, 17 images · IV contrast (APPLIED)
Comparison: CT the abdomen and pelvis 10/13/2019.

CLINICAL DATA: 54-year-old male with history of abdominal pain and
dizziness.

EXAM:
CT ABDOMEN AND PELVIS WITH CONTRAST
TECHNIQUE: Multidetector CT imaging of the abdomen and pelvis was performed
using the standard protocol following bolus administration of
intravenous contrast.
CONTRAST:  100mL OMNIPAQUE IOHEXOL 300 MG/ML  SOLN

[Series 3: routine abd/pel with · axial · 0.89mm/px · z∈[-918,-418]mm · 12 of 114 slices shown, 14 images]
[im 7/114  soft-tissue]
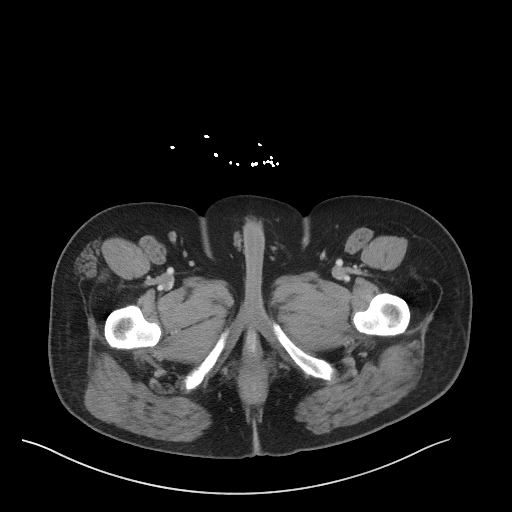
[im 7/114  bone]
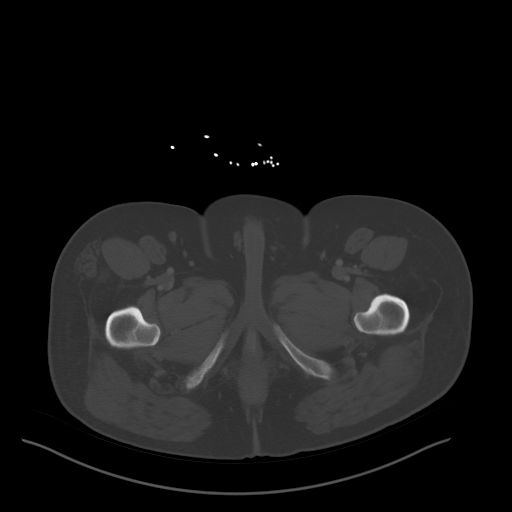
[im 19/114  soft-tissue]
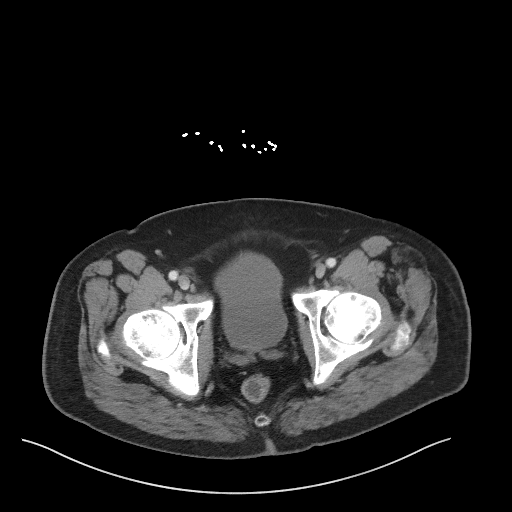
[im 26/114  soft-tissue]
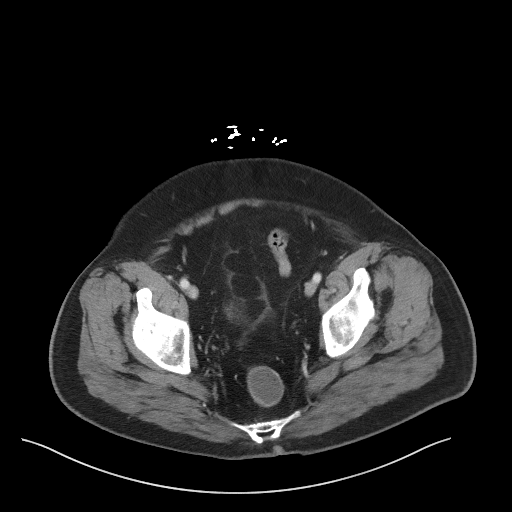
[im 32/114  soft-tissue]
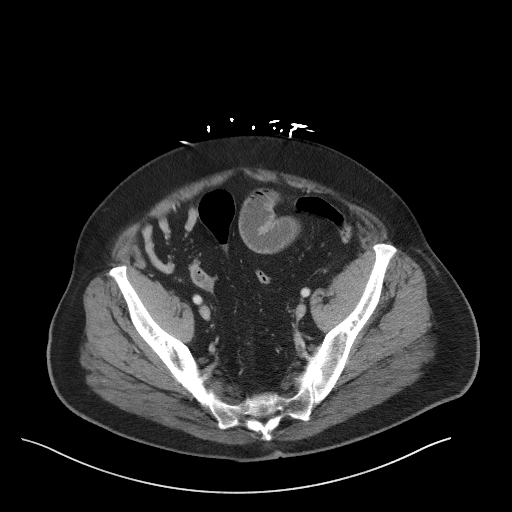
[im 44/114  soft-tissue]
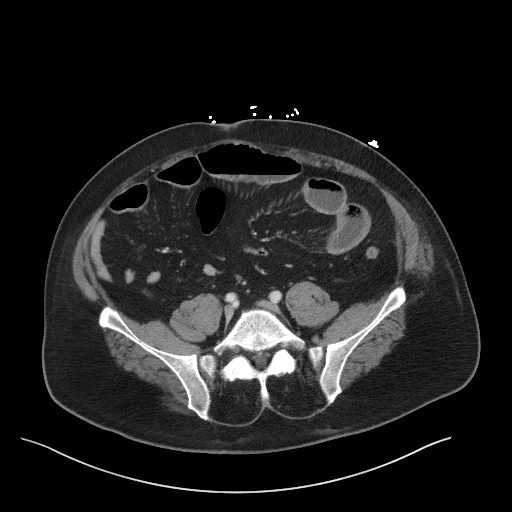
[im 51/114  soft-tissue]
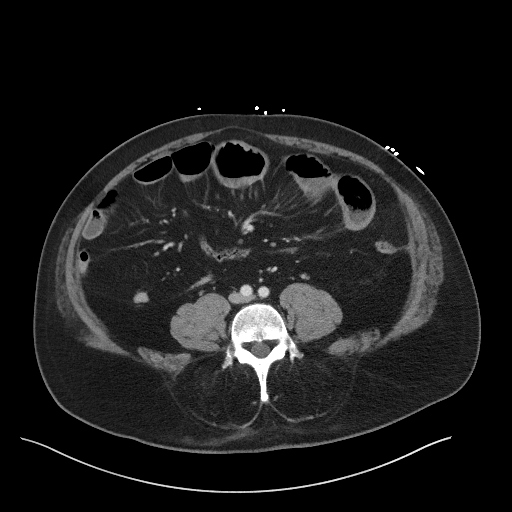
[im 63/114  soft-tissue]
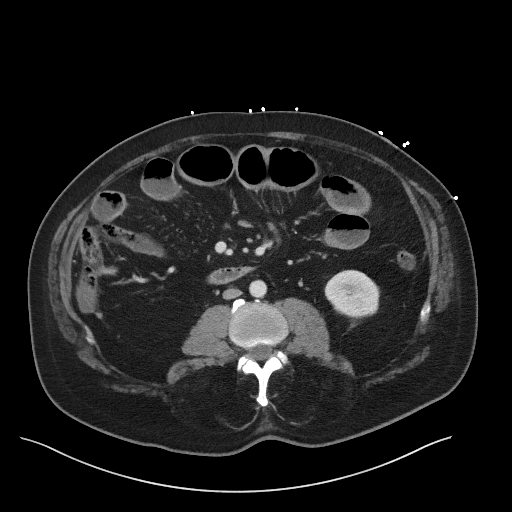
[im 70/114  soft-tissue]
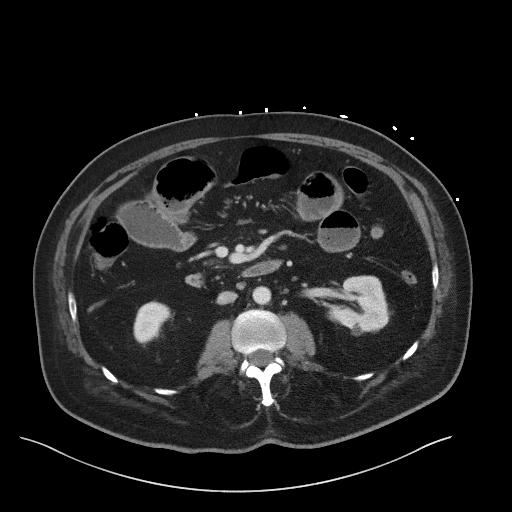
[im 82/114  soft-tissue]
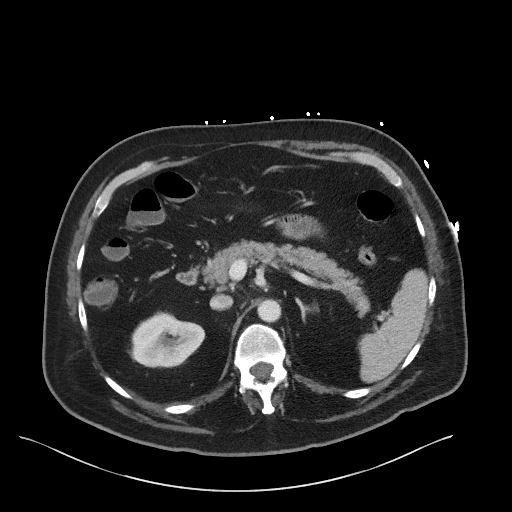
[im 82/114  bone]
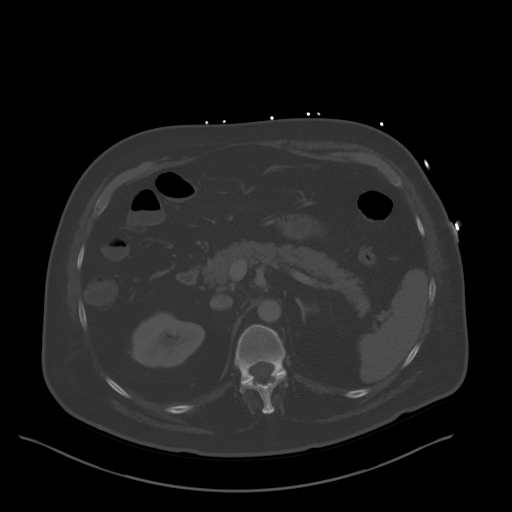
[im 88/114  soft-tissue]
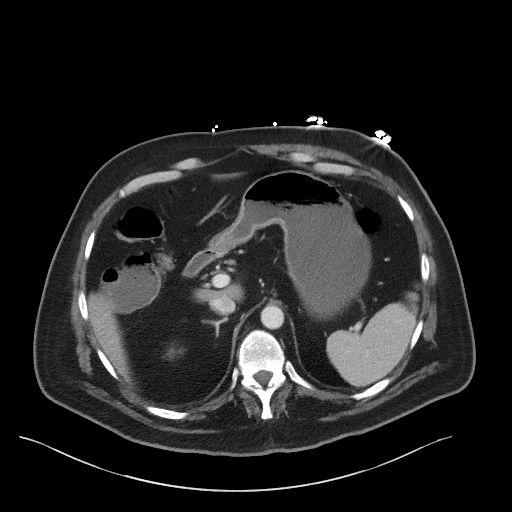
[im 95/114  soft-tissue]
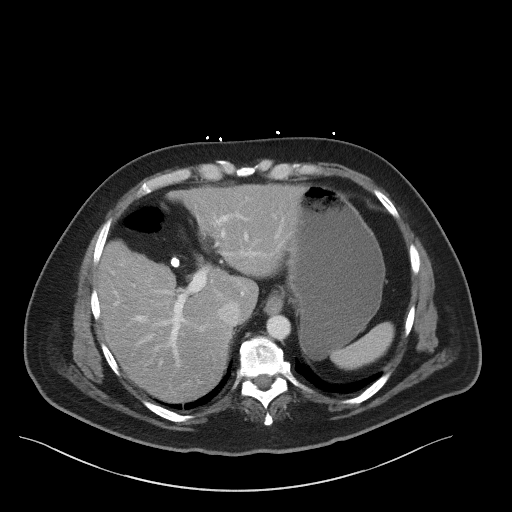
[im 107/114  soft-tissue]
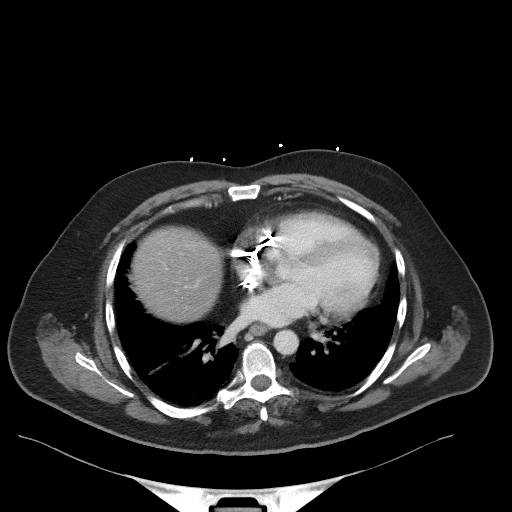

[Series 6: coronal st · coronal · 0.86mm/px · 3 of 107 slices shown]
[im 36/107  soft-tissue]
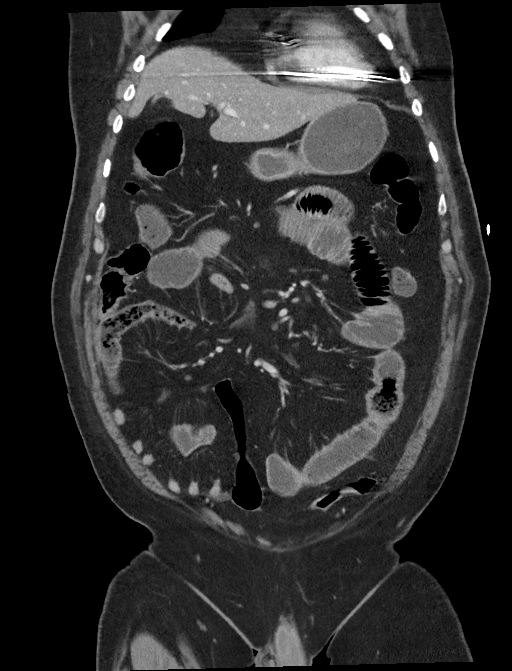
[im 48/107  soft-tissue]
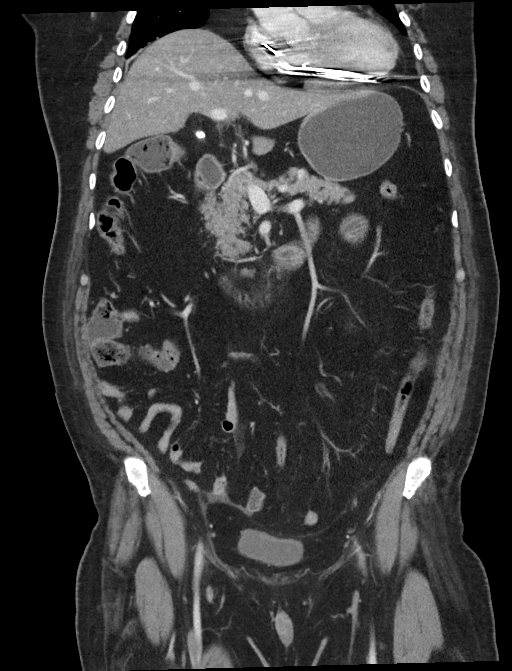
[im 59/107  soft-tissue]
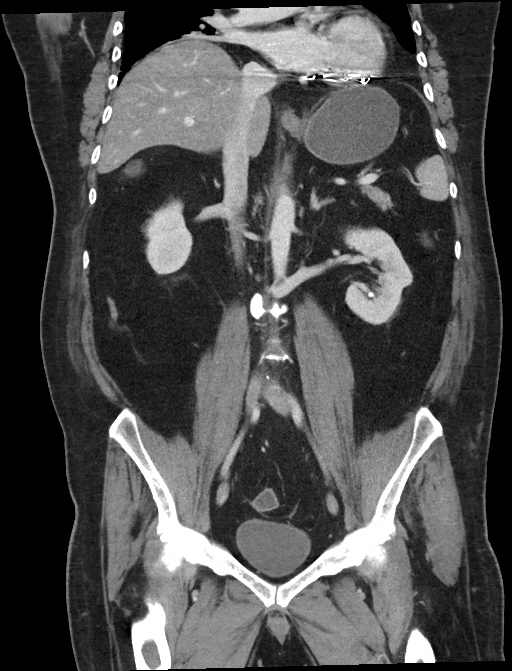

[15 of 46 positions shown; findings below may reference images not displayed]

FINDINGS: Lower chest: Linear scarring in the lower lobes of the lungs
bilaterally. Pacemaker leads in the right atrium, right ventricle
and overlying the lateral wall the left ventricle via the coronary
sinus and coronary veins.

Hepatobiliary: Diffuse low attenuation throughout the visualized
hepatic parenchyma, indicative of hepatic steatosis. No suspicious
cystic or solid hepatic lesions. No intra or extrahepatic biliary
ductal dilatation. Several small calcified gallstones are noted
within the lumen of the gallbladder which is completely decompressed
around the gallstones. No surrounding inflammatory changes.

Pancreas: No pancreatic mass. No pancreatic ductal dilatation. No
pancreatic or peripancreatic fluid collections or inflammatory
changes.

Spleen: Unremarkable.

Adrenals/Urinary Tract: 4 mm nonobstructive calculus in the lower
pole collecting system of left kidney. Multiple low-attenuation
lesions in both kidneys, compatible with simple cysts, largest of
which is exophytic in the upper pole of the left kidney measuring
4.2 cm in diameter. Bilateral adrenal glands are normal in
appearance. No hydroureteronephrosis. Urinary bladder is normal in
appearance.

Stomach/Bowel: Normal appearance of the stomach. Multiple prominent
borderline dilated and mildly dilated loops of small bowel most
evident throughout the mid small bowel, measuring up to 4.4 cm in
diameter. Within the dilated loops of small bowel, there are areas
of mural thickening and mucosal hyperenhancement best appreciated on
axial image 58 of series 3. Several additional areas of mural
thickening and mucosal hyperenhancement are also noted elsewhere
throughout the small bowel and colon, including the distal and
terminal ileum which is completely decompressed, hepatic flexure of
the colon, several regions in the transverse colon, and distal
sigmoid colon and rectum. Normal appendix.

Vascular/Lymphatic: No significant atherosclerotic disease, aneurysm
or dissection noted in the abdominal or pelvic vasculature.
Retroaortic left renal vein (normal anatomical variant) incidentally
noted. No lymphadenopathy noted in the abdomen or pelvis.

Reproductive: Prostate gland and seminal vesicles are unremarkable
in appearance.

Other: No significant volume of ascites.  No pneumoperitoneum.

Musculoskeletal: There are no aggressive appearing lytic or blastic
lesions noted in the visualized portions of the skeleton.
IMPRESSION: 1. Overall, the appearance of the small bowel and colon is
concerning for inflammatory bowel disease such as Crohn's disease,
as detailed above. Further clinical evaluation is recommended.
2. Hepatic steatosis.
3. 4 mm nonobstructive calculus in the lower pole collecting system
of left kidney.
4. Cholelithiasis without evidence of acute cholecystitis at this
time.
5. Additional incidental findings, as above.

## 2021-04-11 IMAGING — US US ABDOMEN LIMITED RUQ/ASCITES
1 series · 14 of 25 positions shown · non-contrast
Comparison: CT 06/29/2020

CLINICAL DATA: Right upper quadrant pain for 3 days.

EXAM:
ULTRASOUND ABDOMEN LIMITED RIGHT UPPER QUADRANT

[Series 1: us abdomen limited ruq (liver/gb) · 14 of 32 slices shown]
[im 1/32]
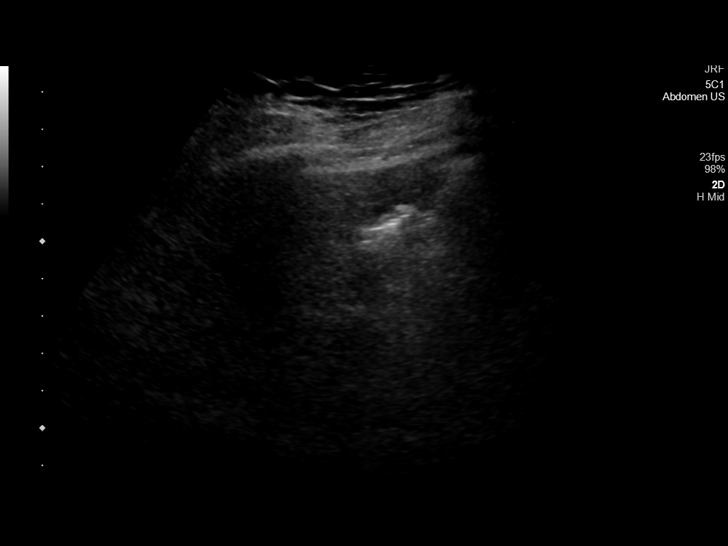
[im 3/32]
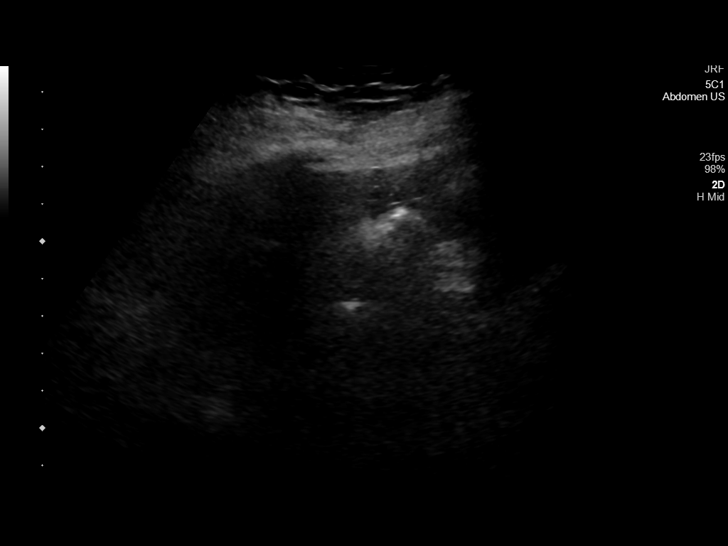
[im 6/32]
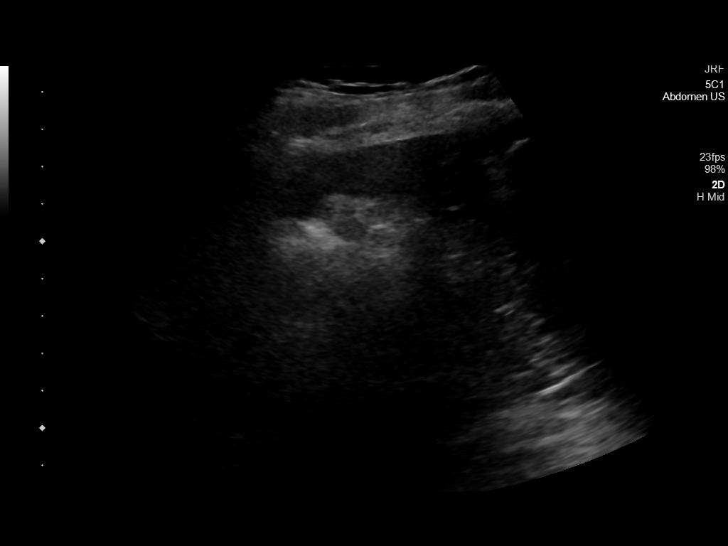
[im 8/32]
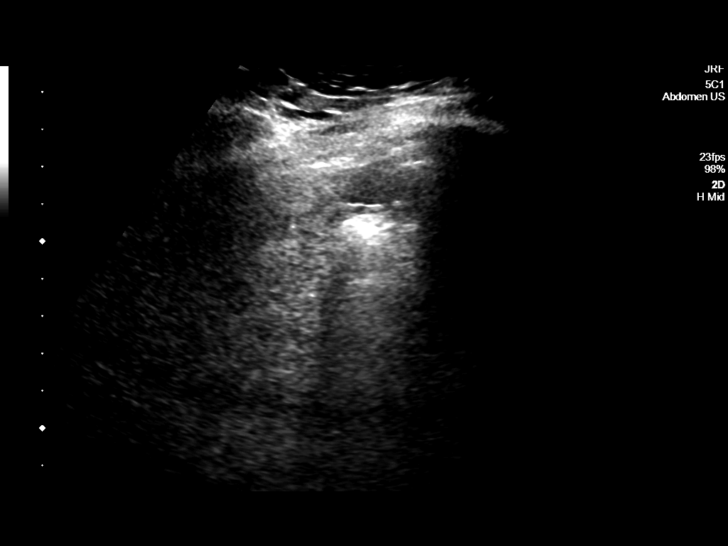
[im 11/32]
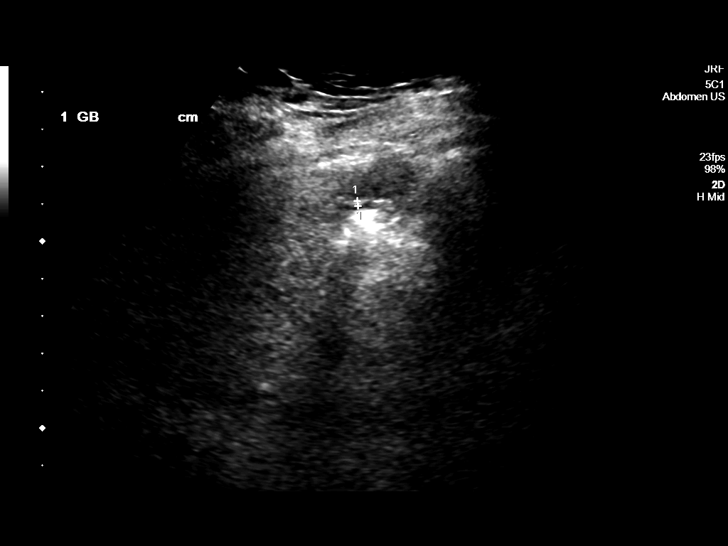
[im 12/32]
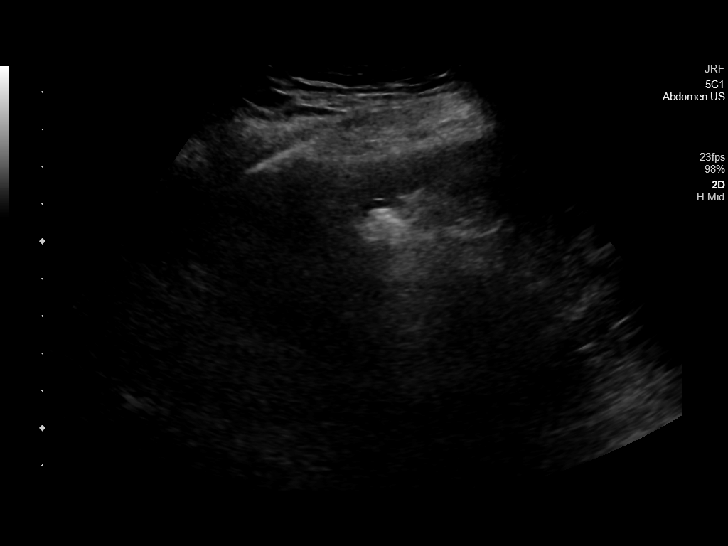
[im 15/32]
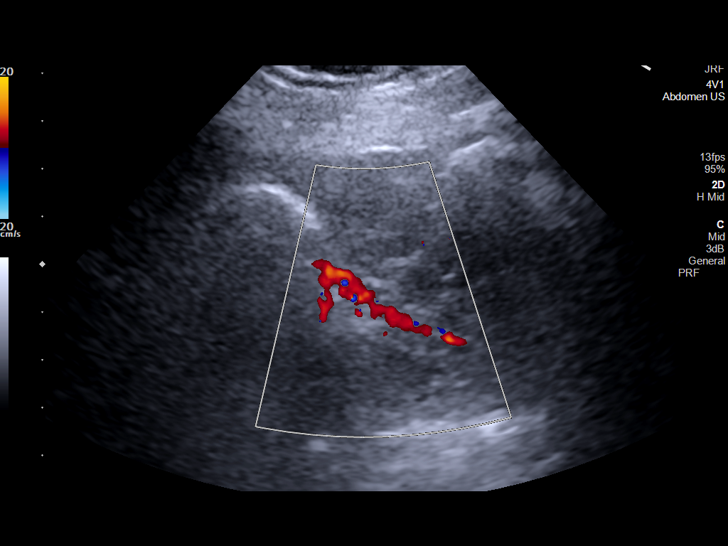
[im 17/32]
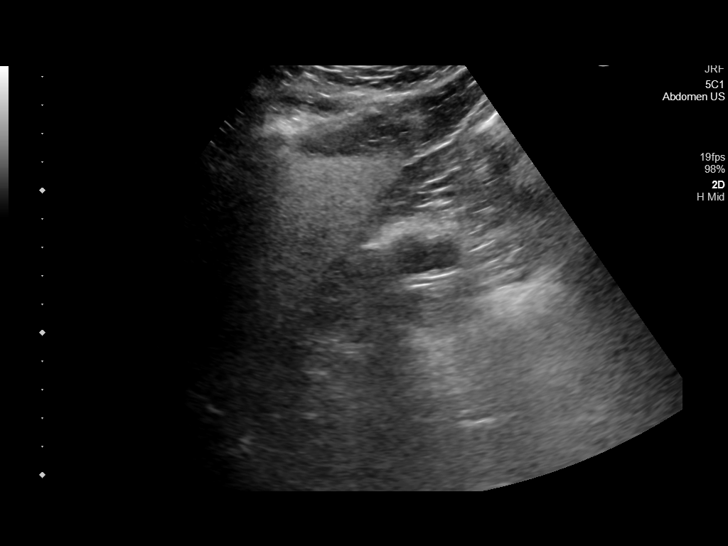
[im 20/32]
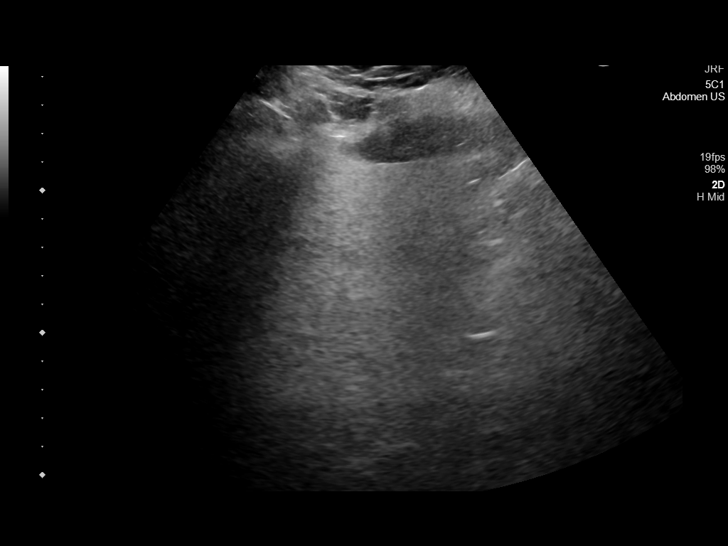
[im 21/32]
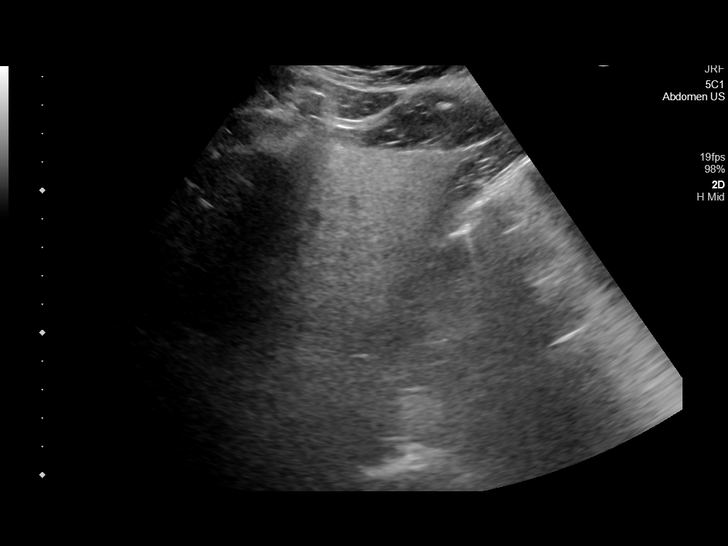
[im 24/32]
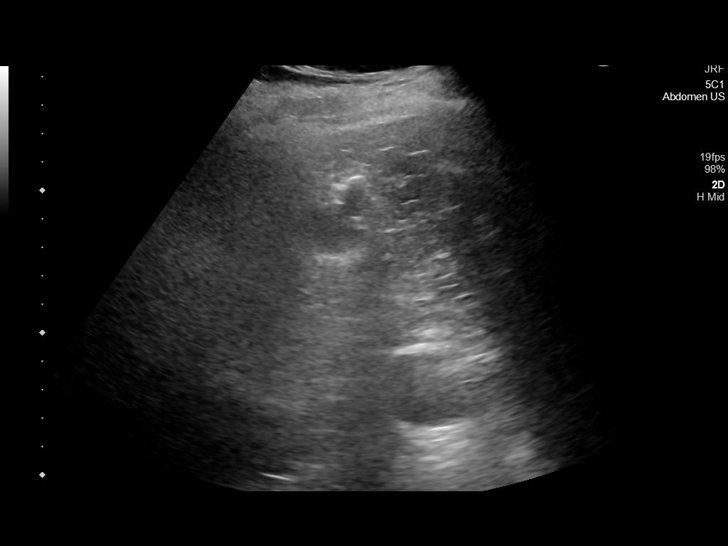
[im 26/32]
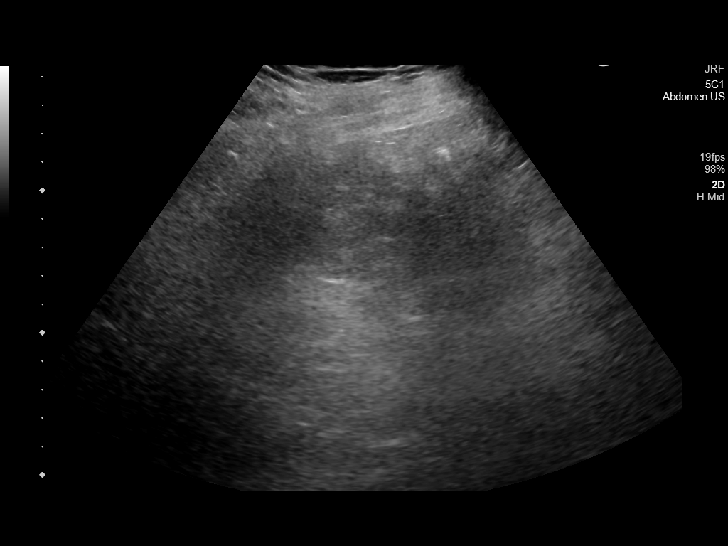
[im 29/32]
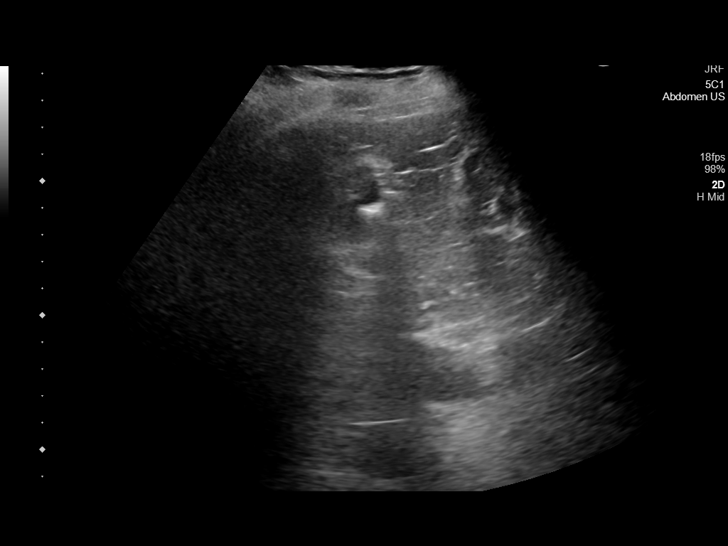
[im 32/32]
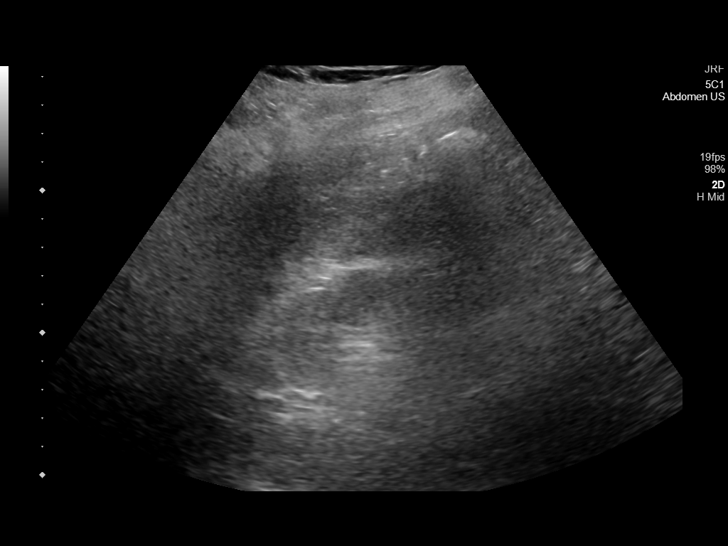

[14 of 25 positions shown; findings below may reference images not displayed]

FINDINGS: Gallbladder:

Contracted gallbladder with multiple stones. No wall thickening or
edema. Murphy's sign is negative.

Common bile duct:

Diameter: 1.5 mm, normal

Liver:

Diffusely increased echotexture suggesting fatty infiltration. No
focal lesions are identified. Portal vein is patent on color Doppler
imaging with normal direction of blood flow towards the liver.

Other: None.
IMPRESSION: Cholelithiasis without additional changes to suggest acute
cholecystitis. Probable diffuse fatty infiltration of the liver.

## 2021-04-11 IMAGING — CT CT HEAD W/O CM
3 series · 15 of 47 positions shown, 18 images · non-contrast
Comparison: Vertebra 12/15/2013.

CLINICAL DATA: Dizziness.

EXAM:
CT HEAD WITHOUT CONTRAST
TECHNIQUE: Contiguous axial images were obtained from the base of the skull
through the vertex without intravenous contrast.

[Series 2: head wo · axial · 0.46mm/px · z∈[-144,-19]mm · 9 of 31 slices shown, 12 images]
[im 3/31  brain]
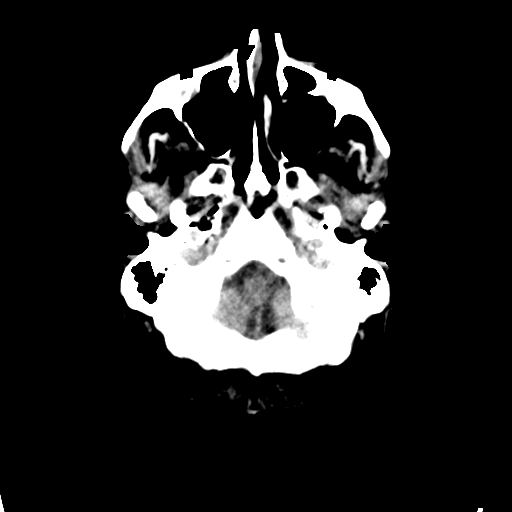
[im 3/31  bone]
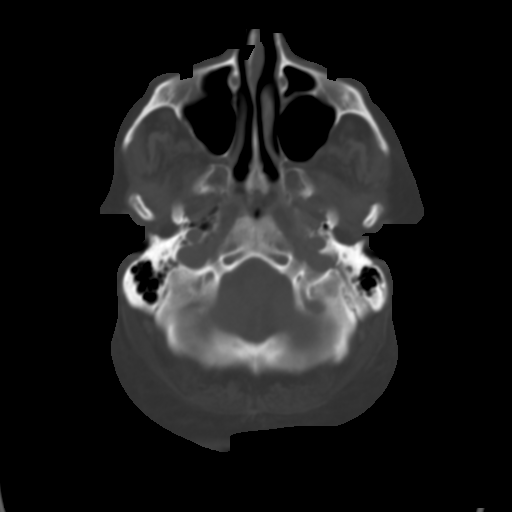
[im 6/31  brain]
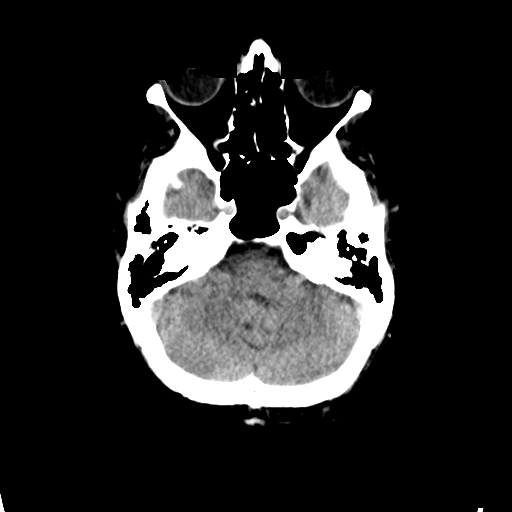
[im 9/31  brain]
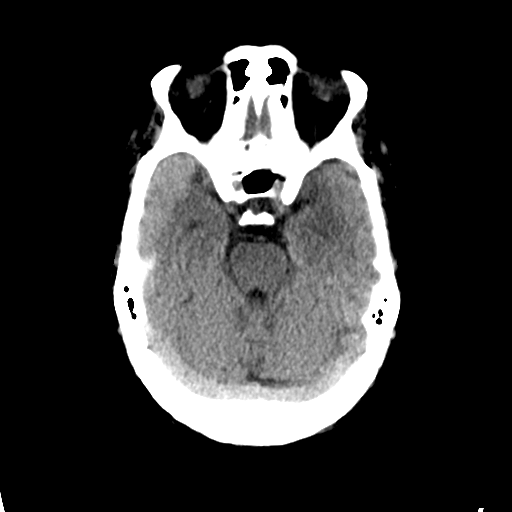
[im 12/31  brain]
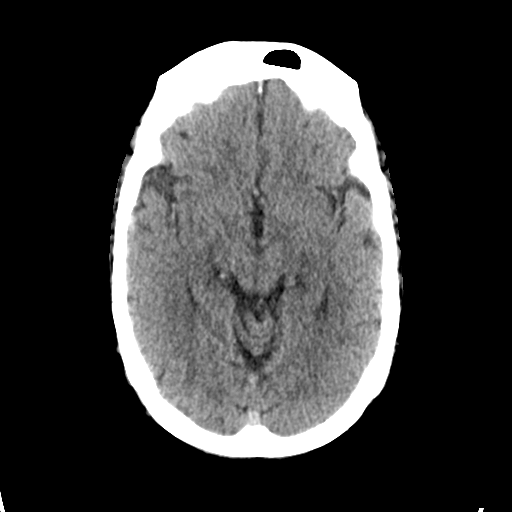
[im 16/31  brain]
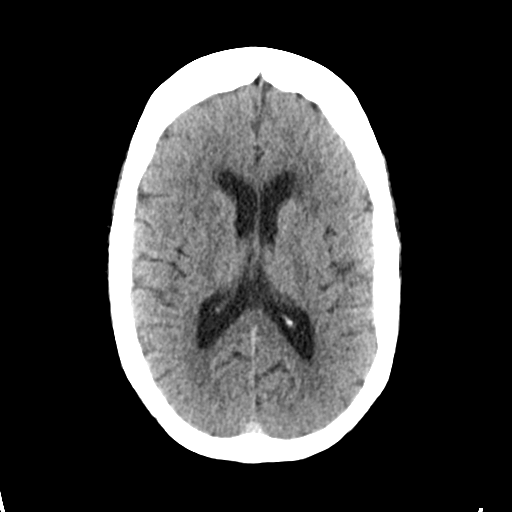
[im 16/31  bone]
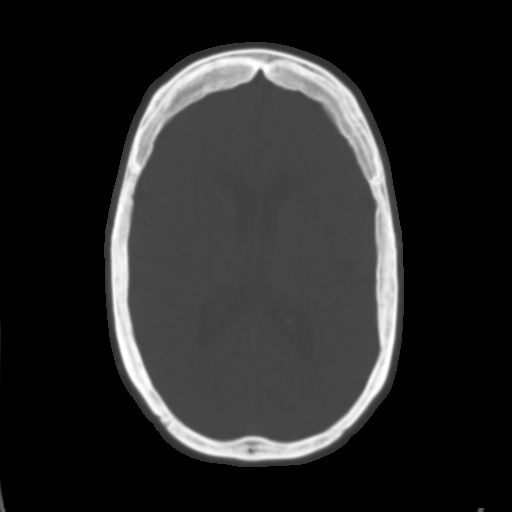
[im 19/31  brain]
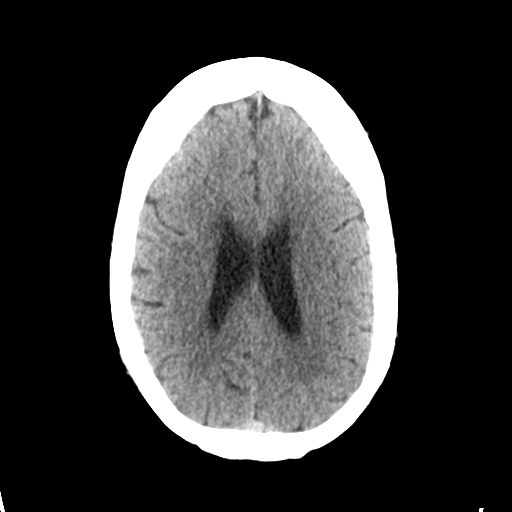
[im 22/31  brain]
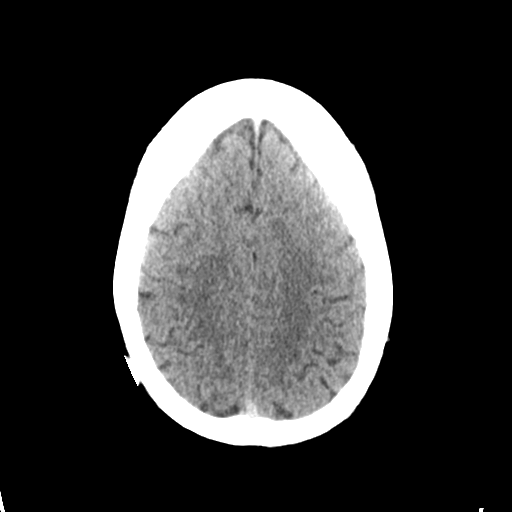
[im 25/31  brain]
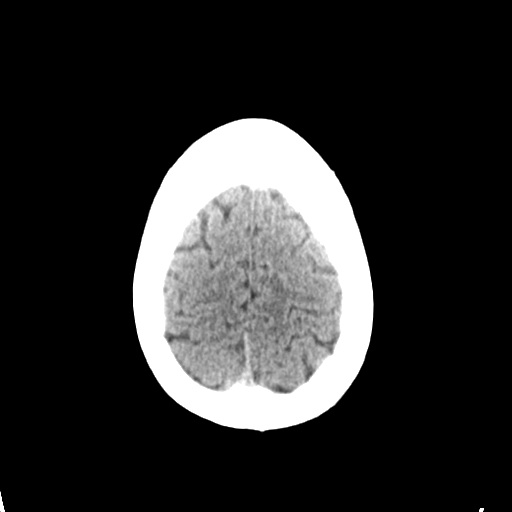
[im 28/31  brain]
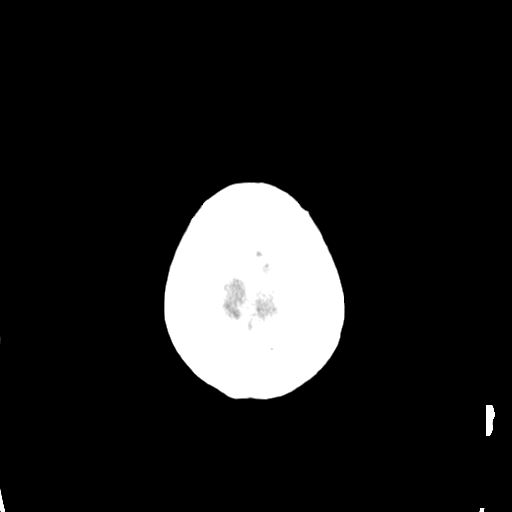
[im 28/31  bone]
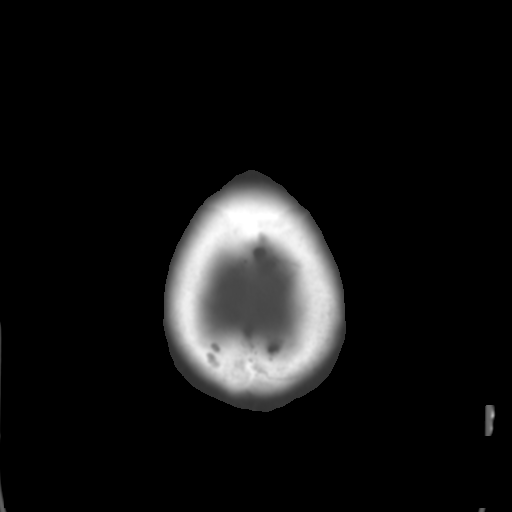

[Series 4: coronal soft tissue · coronal · 0.30mm/px · 3 of 65 slices shown]
[im 22/65  brain]
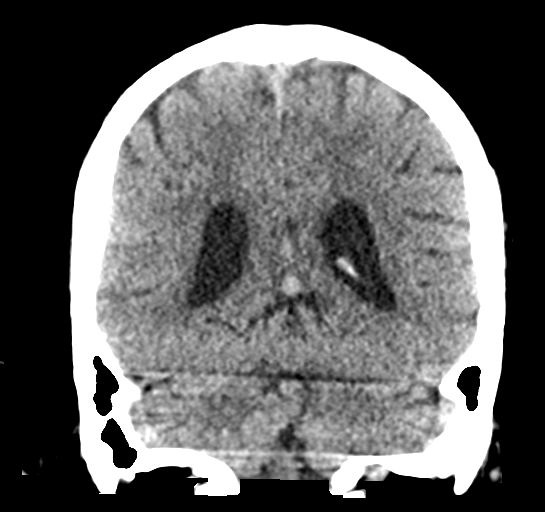
[im 29/65  brain]
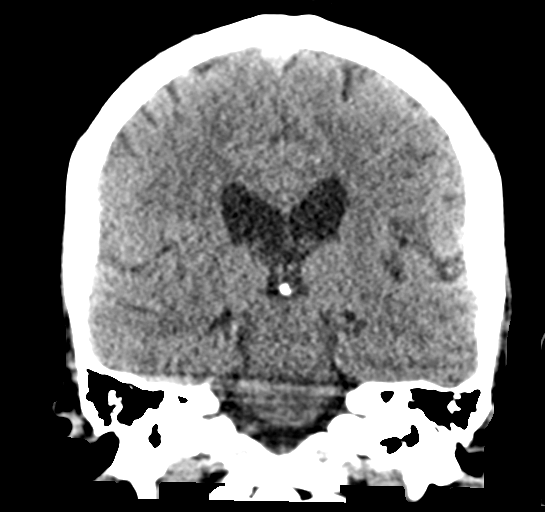
[im 36/65  brain]
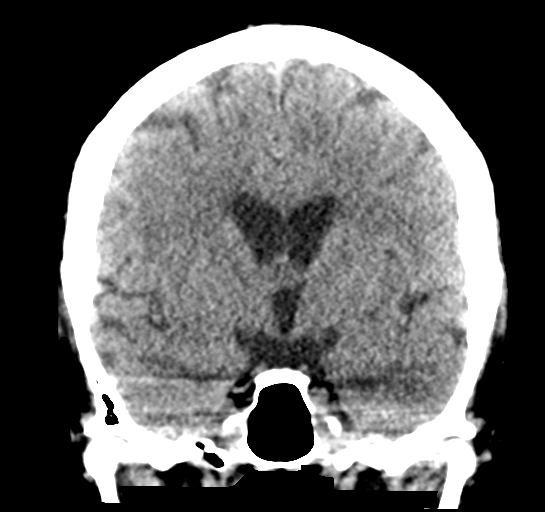

[Series 5: sagittal soft tissue · sagittal · 0.32mm/px · 3 of 51 slices shown]
[im 17/51  brain]
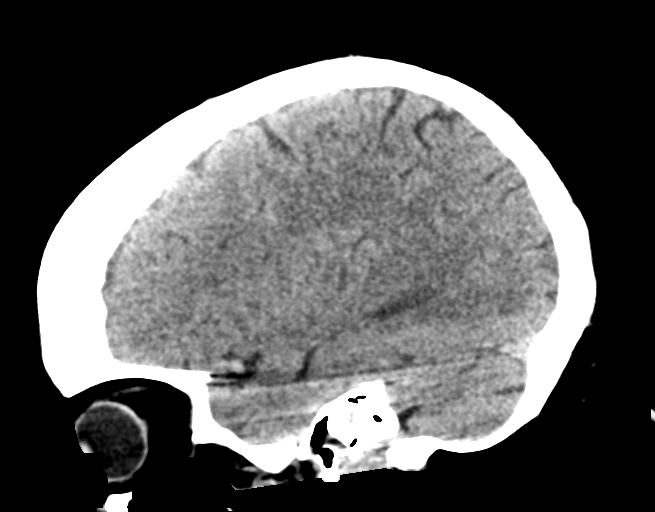
[im 26/51  brain]
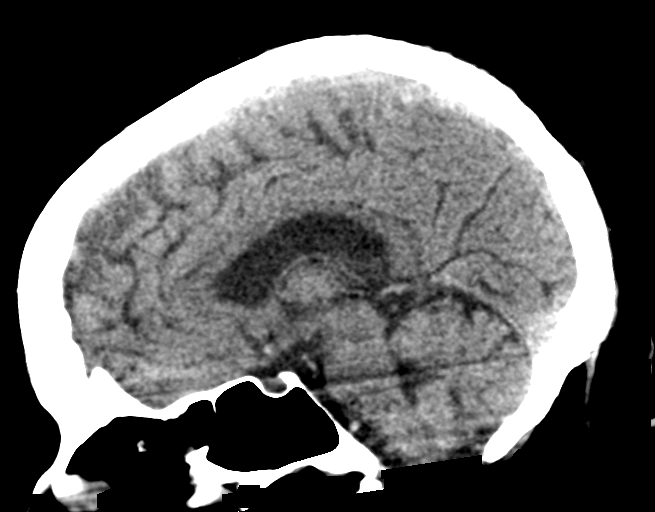
[im 34/51  brain]
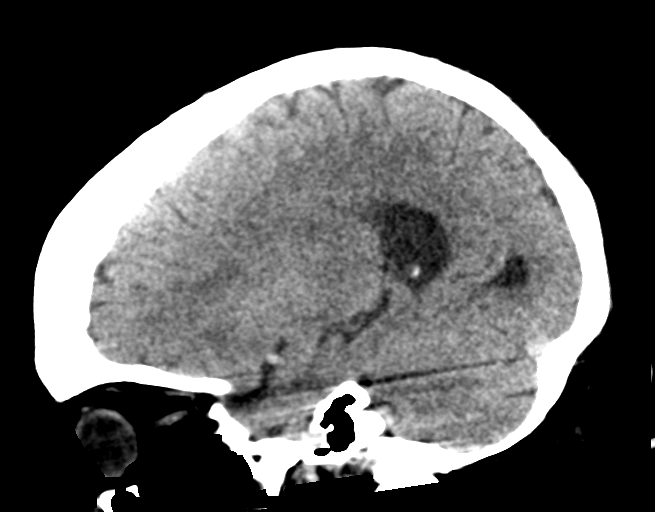

[15 of 47 positions shown; findings below may reference images not displayed]

FINDINGS: Brain: No evidence of acute infarction, hemorrhage, hydrocephalus,
extra-axial collection or mass lesion/mass effect. Mild to moderate
patchy white matter hypoattenuation, nonspecific but most likely
related to chronic microvascular ischemic disease.

Vascular: No hyperdense vessel identified

Skull: No acute fracture. No substantial change in a partially
calcified right parietal scalp lesion, likely benign given long-term
stability.

Sinuses/Orbits: Mild ethmoid air cell mucosal thickening. Otherwise,
visualized sinuses are clear. Unremarkable orbits.

Other: No mastoid effusions.
IMPRESSION: 1. No evidence of acute intracranial abnormality.
2. Mild to moderate chronic microvascular ischemic disease,
progressed from prior.

## 2021-04-13 NOTE — Telephone Encounter (Signed)
Error

## 2021-04-18 DIAGNOSIS — I480 Paroxysmal atrial fibrillation: Secondary | ICD-10-CM | POA: Diagnosis not present

## 2021-04-18 DIAGNOSIS — Z95810 Presence of automatic (implantable) cardiac defibrillator: Secondary | ICD-10-CM | POA: Diagnosis not present

## 2021-05-28 DIAGNOSIS — G7111 Myotonic muscular dystrophy: Secondary | ICD-10-CM | POA: Diagnosis not present

## 2021-06-14 DIAGNOSIS — R942 Abnormal results of pulmonary function studies: Secondary | ICD-10-CM | POA: Diagnosis not present

## 2021-06-14 DIAGNOSIS — Z9981 Dependence on supplemental oxygen: Secondary | ICD-10-CM | POA: Diagnosis not present

## 2021-06-14 DIAGNOSIS — R0609 Other forms of dyspnea: Secondary | ICD-10-CM | POA: Diagnosis not present

## 2021-06-14 DIAGNOSIS — I428 Other cardiomyopathies: Secondary | ICD-10-CM | POA: Diagnosis not present

## 2021-06-14 DIAGNOSIS — G4733 Obstructive sleep apnea (adult) (pediatric): Secondary | ICD-10-CM | POA: Diagnosis not present

## 2021-06-14 DIAGNOSIS — R918 Other nonspecific abnormal finding of lung field: Secondary | ICD-10-CM | POA: Diagnosis not present

## 2021-07-22 ENCOUNTER — Telehealth: Payer: Self-pay | Admitting: Family Medicine

## 2021-07-22 NOTE — Telephone Encounter (Signed)
Copied from [place] [phone]. Topic: Medicare AWV ?>> Jul 22, 2021 10:25 AM Harris-Coley, Hannah Beat wrote: ?Reason for CRM: Attempted to schedule AWV. Unable to LVM.  Will try at later time. ?

## 2021-10-01 DIAGNOSIS — I420 Dilated cardiomyopathy: Secondary | ICD-10-CM | POA: Diagnosis not present

## 2021-10-04 DIAGNOSIS — G4733 Obstructive sleep apnea (adult) (pediatric): Secondary | ICD-10-CM | POA: Diagnosis not present

## 2021-10-04 DIAGNOSIS — G7111 Myotonic muscular dystrophy: Secondary | ICD-10-CM | POA: Diagnosis not present

## 2021-10-04 DIAGNOSIS — R0609 Other forms of dyspnea: Secondary | ICD-10-CM | POA: Diagnosis not present

## 2021-11-09 ENCOUNTER — Other Ambulatory Visit: Payer: Self-pay

## 2021-11-09 NOTE — Patient Outreach (Signed)
[facility]) Care Management  11/09/2021  ZEKI BEDROSIAN 1965-04-26 [id number]   Telephone Screen    Outreach call to patient to introduce [facility] services and assess care needs as part of benefit of PCP office and insurance plan.   Main healthcare issue/concern today: Patient denies any acute issues or concerns at present. States things going well as to be expected.    Health Maintenance/Care Gaps Addressed & Education Provided:   -Last AWV: 09/11/19. Discussed with patient he will call and make an appt. Declined need for assistance with scheduling.     Enzo Montgomery, RN,BSN,CCM [facility] Telephonic Care Management Coordinator Direct Phone: [phone] Toll Free: [phone] Fax: [phone]

## 2021-12-17 DIAGNOSIS — R0602 Shortness of breath: Secondary | ICD-10-CM | POA: Diagnosis not present

## 2021-12-17 DIAGNOSIS — G7111 Myotonic muscular dystrophy: Secondary | ICD-10-CM | POA: Diagnosis not present

## 2022-01-13 DIAGNOSIS — Z0100 Encounter for examination of eyes and vision without abnormal findings: Secondary | ICD-10-CM | POA: Diagnosis not present

## 2022-01-13 DIAGNOSIS — H35372 Puckering of macula, left eye: Secondary | ICD-10-CM | POA: Diagnosis not present

## 2022-01-13 DIAGNOSIS — H2513 Age-related nuclear cataract, bilateral: Secondary | ICD-10-CM | POA: Diagnosis not present

## 2022-01-14 DIAGNOSIS — G7100 Muscular dystrophy, unspecified: Secondary | ICD-10-CM | POA: Diagnosis not present

## 2022-01-14 DIAGNOSIS — Z87891 Personal history of nicotine dependence: Secondary | ICD-10-CM | POA: Diagnosis not present

## 2022-01-14 DIAGNOSIS — I442 Atrioventricular block, complete: Secondary | ICD-10-CM | POA: Diagnosis not present

## 2022-01-14 DIAGNOSIS — R9431 Abnormal electrocardiogram [ECG] [EKG]: Secondary | ICD-10-CM | POA: Diagnosis not present

## 2022-01-14 DIAGNOSIS — Z95810 Presence of automatic (implantable) cardiac defibrillator: Secondary | ICD-10-CM | POA: Diagnosis not present

## 2022-01-14 DIAGNOSIS — I420 Dilated cardiomyopathy: Secondary | ICD-10-CM | POA: Diagnosis not present

## 2022-01-14 DIAGNOSIS — I447 Left bundle-branch block, unspecified: Secondary | ICD-10-CM | POA: Diagnosis not present

## 2022-01-14 DIAGNOSIS — I480 Paroxysmal atrial fibrillation: Secondary | ICD-10-CM | POA: Diagnosis not present

## 2022-01-24 ENCOUNTER — Ambulatory Visit (INDEPENDENT_AMBULATORY_CARE_PROVIDER_SITE_OTHER): Payer: Medicare HMO | Admitting: Family Medicine

## 2022-01-24 VITALS — BP 130/70 | HR 80 | Temp 98.5°F | Ht 69.0 in | Wt 201.8 lb

## 2022-01-24 DIAGNOSIS — I429 Cardiomyopathy, unspecified: Secondary | ICD-10-CM | POA: Diagnosis not present

## 2022-01-24 DIAGNOSIS — H9202 Otalgia, left ear: Secondary | ICD-10-CM | POA: Diagnosis not present

## 2022-01-24 DIAGNOSIS — Z13220 Encounter for screening for lipoid disorders: Secondary | ICD-10-CM | POA: Diagnosis not present

## 2022-01-24 DIAGNOSIS — H9203 Otalgia, bilateral: Secondary | ICD-10-CM | POA: Insufficient documentation

## 2022-01-24 DIAGNOSIS — G7111 Myotonic muscular dystrophy: Secondary | ICD-10-CM

## 2022-01-24 DIAGNOSIS — R7303 Prediabetes: Secondary | ICD-10-CM | POA: Diagnosis not present

## 2022-01-24 DIAGNOSIS — G4733 Obstructive sleep apnea (adult) (pediatric): Secondary | ICD-10-CM | POA: Diagnosis not present

## 2022-01-24 NOTE — Progress Notes (Signed)
[name], MD Phone: [phone]  James [name] is a 56 y.o. male who presents today for f/u.  OSA BiPAP use: nightly 7-8 hours Hypersomnia: yes Well rested: no Managed by [facility]  Myotonic dystrophy: Patient followed up with his specialist recently.  They noted mild progression.  They did release him for a year unless he has any significant progression.  Cardiomyopathy: Patient notes stable shortness of breath.  No edema.  No orthopnea.  No PND.  Not currently on any medication.  Left ear pain: This is intermittent.  Has been going on for a couple of months.  He notes no other respiratory complaints.  No hearing loss.  He does note some watery eyes at times.  He notes he saw his eye doctor for this and they advised him that he had dry eyes and needs to use artificial tears.   Social History   Tobacco Use  Smoking Status Never  Smokeless Tobacco Never    Current Outpatient Medications on File Prior to Visit  Medication Sig Dispense Refill   acetaminophen (TYLENOL) 500 MG tablet Take 500-1,000 mg by mouth every 6 (six) hours as needed for moderate pain or headache.     albuterol (PROVENTIL) (2.5 MG/3ML) 0.083% nebulizer solution Take 3 mLs (2.5 mg total) by nebulization every 6 (six) hours as needed for wheezing or shortness of breath. 150 mL 1   docusate sodium (COLACE) 100 MG capsule Take 100 mg by mouth daily as needed for mild constipation.     Multiple Vitamin (MULTIVITAMIN) tablet Take 1 tablet by mouth daily.     Ondansetron HCl (ZOFRAN PO) Take 1 tablet by mouth every 8 (eight) hours as needed (nausea/vomiting).     No current facility-administered medications on file prior to visit.     ROS see history of present illness  Objective  Physical Exam Vitals:   01/24/22 1424  BP: 130/70  Pulse: 80  Temp: 98.5 F (36.9 C)  SpO2: 95%    BP Readings from Last 3 Encounters:  01/24/22 130/70  02/09/21 103/70  07/06/20 120/80   Wt Readings from Last 3  Encounters:  01/24/22 201 lb 12.8 oz (91.5 kg)  03/08/21 211 lb (95.7 kg)  02/05/21 201 lb (91.2 kg)    Physical Exam Constitutional:      General: He is not in acute distress.    Appearance: He is not diaphoretic.  HENT:     Right Ear: Tympanic membrane normal.     Left Ear: Tympanic membrane normal.  Cardiovascular:     Rate and Rhythm: Normal rate and regular rhythm.     Heart sounds: Normal heart sounds.  Pulmonary:     Effort: Pulmonary effort is normal.     Breath sounds: Normal breath sounds.  Skin:    General: Skin is warm and dry.  Neurological:     Mental Status: He is alert.      Assessment/Plan: Please see individual problem list.  Problem List Items Addressed This Visit     Cardiomyopathy ([place]) - Primary (Chronic)    Chronic issue.  Stable symptomatically.  He will monitor his symptoms and continue to follow with his specialist.      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      Obstructive sleep apnea (Chronic)    Continue BiPAP.  He will continue to follow with his specialist at [facility].      Left ear pain  Possibly eustachian tube dysfunction.  Discussed use of Claritin.  This may help with his eye symptoms as well.      Prediabetes   Relevant Orders   HgB A1c   Other Visit Diagnoses     Lipid screening       Relevant Orders   Comp Met (CMET)   Lipid panel       Return in about 1 year (around 01/25/2023).   [name], MD [place]

## 2022-01-24 NOTE — Assessment & Plan Note (Signed)
He will continue to see [facility] for this.

## 2022-01-24 NOTE — Assessment & Plan Note (Signed)
Continue BiPAP.  He will continue to follow with his specialist at [facility].

## 2022-01-24 NOTE — Assessment & Plan Note (Signed)
Possibly eustachian tube dysfunction.  Discussed use of Claritin.  This may help with his eye symptoms as well.

## 2022-01-24 NOTE — Assessment & Plan Note (Signed)
Chronic issue.  Stable symptomatically.  He will monitor his symptoms and continue to follow with his specialist.

## 2022-01-25 LAB — LIPID PANEL
Cholesterol: 132 mg/dL (ref 0–200)
HDL: 34.3 mg/dL — ABNORMAL LOW (ref >39.00)
LDL Cholesterol: 59 mg/dL (ref 0–99)
NonHDL: 98.11
Total CHOL/HDL Ratio: 4
Triglycerides: 197 mg/dL — ABNORMAL HIGH (ref 0.0–149.0)
VLDL: 39.4 mg/dL (ref 0.0–40.0)

## 2022-01-25 LAB — COMPREHENSIVE METABOLIC PANEL
ALT: 27 U/L (ref 0–53)
AST: 25 U/L (ref 0–37)
Albumin: 3.8 g/dL (ref 3.5–5.2)
Alkaline Phosphatase: 68 U/L (ref 39–117)
BUN: 7 mg/dL (ref 6–23)
CO2: 27 mEq/L (ref 19–32)
Calcium: 8.8 mg/dL (ref 8.4–10.5)
Chloride: 108 mEq/L (ref 96–112)
Creatinine, Ser: 0.76 mg/dL (ref 0.40–1.50)
GFR: 100.82 mL/min (ref >60.00)
Glucose, Bld: 141 mg/dL — ABNORMAL HIGH (ref 70–99)
Potassium: 4 mEq/L (ref 3.5–5.1)
Sodium: 144 mEq/L (ref 135–145)
Total Bilirubin: 1.1 mg/dL (ref 0.2–1.2)
Total Protein: 6.5 g/dL (ref 6.0–8.3)

## 2022-01-25 LAB — HEMOGLOBIN A1C: Hgb A1c MFr Bld: 6.2 % (ref 4.6–6.5)

## 2022-01-26 ENCOUNTER — Ambulatory Visit (INDEPENDENT_AMBULATORY_CARE_PROVIDER_SITE_OTHER): Payer: Medicare HMO

## 2022-01-26 VITALS — Ht 69.0 in | Wt 201.0 lb

## 2022-01-26 DIAGNOSIS — Z0000 Encounter for general adult medical examination without abnormal findings: Secondary | ICD-10-CM

## 2022-01-26 NOTE — Patient Instructions (Addendum)
[name] , Thank you for taking time to come for your Medicare Wellness Visit. I appreciate your ongoing commitment to your health goals. Please review the following plan we discussed and let me know if I can assist you in the future.   These are the goals we discussed:  Goals       Patient Stated     Follow up with Primary Care Provider (pt-stated)      As needed.        This is a list of the screening recommended for you and due dates:  Health Maintenance  Topic Date Due   Tetanus Vaccine  03/28/2022*   Hepatitis C Screening: USPSTF Recommendation to screen - Ages 18-79 yo.  03/28/2022*   Zoster (Shingles) Vaccine (1 of 2) 04/28/2022*   COVID-19 Vaccine (5 - Pfizer risk series) 03/21/2022   Medicare Annual Wellness Visit  01/27/2023   Colon Cancer Screening  07/02/2023   Flu Shot  Completed   HIV Screening  Completed   HPV Vaccine  Aged Out  *Topic was postponed. The date shown is not the original due date.    Advanced directives: on file  Conditions/risks identified: none new  Next appointment: Follow up in one year for your annual wellness visit   Preventive Care 40-64 Years, Male Preventive care refers to lifestyle choices and visits with your health care provider that can promote health and wellness. What does preventive care include? A yearly physical exam. This is also called an annual well check. Dental exams once or twice a year. Routine eye exams. Ask your health care provider how often you should have your eyes checked. Personal lifestyle choices, including: Daily care of your teeth and gums. Regular physical activity. Eating a healthy diet. Avoiding tobacco and drug use. Limiting alcohol use. Practicing safe sex. Taking low-dose aspirin every day starting at age 68. What happens during an annual well check? The services and screenings done by your health care provider during your annual well check will depend on your age, overall health, lifestyle risk  factors, and family history of disease. Counseling  Your health care provider may ask you questions about your: Alcohol use. Tobacco use. Drug use. Emotional well-being. Home and relationship well-being. Sexual activity. Eating habits. Work and work Statistician. Screening  You may have the following tests or measurements: Height, weight, and BMI. Blood pressure. Lipid and cholesterol levels. These may be checked every 5 years, or more frequently if you are over 55 years old. Skin check. Lung cancer screening. You may have this screening every year starting at age 40 if you have a 30-pack-year history of smoking and currently smoke or have quit within the past 15 years. Fecal occult blood test (FOBT) of the stool. You may have this test every year starting at age 37. Flexible sigmoidoscopy or colonoscopy. You may have a sigmoidoscopy every 5 years or a colonoscopy every 10 years starting at age 66. Prostate cancer screening. Recommendations will vary depending on your family history and other risks. Hepatitis C blood test. Hepatitis B blood test. Sexually transmitted disease (STD) testing. Diabetes screening. This is done by checking your blood sugar (glucose) after you have not eaten for a while (fasting). You may have this done every 1-3 years. Discuss your test results, treatment options, and if necessary, the need for more tests with your health care provider. Vaccines  Your health care provider may recommend certain vaccines, such as: Influenza vaccine. This is recommended every year. Tetanus, diphtheria,  and acellular pertussis (Tdap, Td) vaccine. You may need a Td booster every 10 years. Zoster vaccine. You may need this after age 62. Pneumococcal 13-valent conjugate (PCV13) vaccine. You may need this if you have certain conditions and have not been vaccinated. Pneumococcal polysaccharide (PPSV23) vaccine. You may need one or two doses if you smoke cigarettes or if you have  certain conditions. Talk to your health care provider about which screenings and vaccines you need and how often you need them. This information is not intended to replace advice given to you by your health care provider. Make sure you discuss any questions you have with your health care provider. Document Released: 04/10/2015 Document Revised: 12/02/2015 Document Reviewed: 01/13/2015 Elsevier Interactive Patient Education  2017 [place] Prevention in the Home Falls can cause injuries. They can happen to people of all ages. There are many things you can do to make your home safe and to help prevent falls. What can I do on the outside of my home? Regularly fix the edges of walkways and driveways and fix any cracks. Remove anything that might make you trip as you walk through a door, such as a raised step or threshold. Trim any bushes or trees on the path to your home. Use bright outdoor lighting. Clear any walking paths of anything that might make someone trip, such as rocks or tools. Regularly check to see if handrails are loose or broken. Make sure that both sides of any steps have handrails. Any raised decks and porches should have guardrails on the edges. Have any leaves, snow, or ice cleared regularly. Use sand or salt on walking paths during winter. Clean up any spills in your garage right away. This includes oil or grease spills. What can I do in the bathroom? Use night lights. Install grab bars by the toilet and in the tub and shower. Do not use towel bars as grab bars. Use non-skid mats or decals in the tub or shower. If you need to sit down in the shower, use a plastic, non-slip stool. Keep the floor dry. Clean up any water that spills on the floor as soon as it happens. Remove soap buildup in the tub or shower regularly. Attach bath mats securely with double-sided non-slip rug tape. Do not have throw rugs and other things on the floor that can make you trip. What can  I do in the bedroom? Use night lights. Make sure that you have a light by your bed that is easy to reach. Do not use any sheets or blankets that are too big for your bed. They should not hang down onto the floor. Have a firm chair that has side arms. You can use this for support while you get dressed. Do not have throw rugs and other things on the floor that can make you trip. What can I do in the kitchen? Clean up any spills right away. Avoid walking on wet floors. Keep items that you use a lot in easy-to-reach places. If you need to reach something above you, use a strong step stool that has a grab bar. Keep electrical cords out of the way. Do not use floor polish or wax that makes floors slippery. If you must use wax, use non-skid floor wax. Do not have throw rugs and other things on the floor that can make you trip. What can I do with my stairs? Do not leave any items on the stairs. Make sure that there are handrails on  both sides of the stairs and use them. Fix handrails that are broken or loose. Make sure that handrails are as long as the stairways. Check any carpeting to make sure that it is firmly attached to the stairs. Fix any carpet that is loose or worn. Avoid having throw rugs at the top or bottom of the stairs. If you do have throw rugs, attach them to the floor with carpet tape. Make sure that you have a light switch at the top of the stairs and the bottom of the stairs. If you do not have them, ask someone to add them for you. What else can I do to help prevent falls? Wear shoes that: Do not have high heels. Have rubber bottoms. Are comfortable and fit you well. Are closed at the toe. Do not wear sandals. If you use a stepladder: Make sure that it is fully opened. Do not climb a closed stepladder. Make sure that both sides of the stepladder are locked into place. Ask someone to hold it for you, if possible. Clearly mark and make sure that you can see: Any grab bars or  handrails. First and last steps. Where the edge of each step is. Use tools that help you move around (mobility aids) if they are needed. These include: Canes. Walkers. Scooters. Crutches. Turn on the lights when you go into a dark area. Replace any light bulbs as soon as they burn out. Set up your furniture so you have a clear path. Avoid moving your furniture around. If any of your floors are uneven, fix them. If there are any pets around you, be aware of where they are. Review your medicines with your doctor. Some medicines can make you feel dizzy. This can increase your chance of falling. Ask your doctor what other things that you can do to help prevent falls. This information is not intended to replace advice given to you by your health care provider. Make sure you discuss any questions you have with your health care provider. Document Released: 01/08/2009 Document Revised: 08/20/2015 Document Reviewed: 04/18/2014 Elsevier Interactive Patient Education  2017 [employer].

## 2022-01-26 NOTE — Progress Notes (Signed)
Subjective:   James [name] is a 56 y.o. male who presents for Medicare Annual/Subsequent preventive examination.  Review of Systems    No ROS.  Medicare Wellness Virtual Visit.  Visual/audio telehealth visit, UTA vital signs.   See social history for additional risk factors.   Cardiac Risk Factors include: advanced age (>27mn, >>91women);male gender     Objective:    Today's Vitals   01/26/22 1508  Weight: 201 lb (91.2 kg)  Height: '5\' 9"'$  (1.753 m)   Body mass index is 29.68 kg/m.     01/26/2022    3:12 PM 02/08/2021   11:00 PM 02/08/2021   12:57 PM 02/07/2021    8:30 PM 02/05/2021    5:20 AM 06/30/2020    1:01 AM 06/29/2020    3:04 PM  Advanced Directives  Does Patient Have a Medical Advance Directive? Yes Yes Yes  No No No  Type of AParamedicof AJulianLiving will Healthcare Power of ASoham     Does patient want to make changes to medical advance directive? No - Patient declined No - Patient declined       Copy of [place]Free [place]in Chart? Yes - validated most recent copy scanned in chart (See row information)        Would patient like information on creating a medical advance directive?    No - Patient declined No - Patient declined Yes (Inpatient - patient requests chaplain consult to create a medical advance directive)     Current Medications (verified) Outpatient Encounter Medications as of 01/26/2022  Medication Sig   acetaminophen (TYLENOL) 500 MG tablet Take 500-1,000 mg by mouth every 6 (six) hours as needed for moderate pain or headache.   albuterol (PROVENTIL) (2.5 MG/3ML) 0.083% nebulizer solution Take 3 mLs (2.5 mg total) by nebulization every 6 (six) hours as needed for wheezing or shortness of breath.   docusate sodium (COLACE) 100 MG capsule Take 100 mg by mouth daily as needed for mild constipation.   Multiple Vitamin (MULTIVITAMIN) tablet Take 1 tablet by mouth daily.   Ondansetron  HCl (ZOFRAN PO) Take 1 tablet by mouth every 8 (eight) hours as needed (nausea/vomiting).   No facility-administered encounter medications on file as of 01/26/2022.    Allergies (verified) Codeine   History: Past Medical History:  Diagnosis Date   Bronchitis    Cardiomyopathy ([place]    Dysphagia    GERD (gastroesophageal reflux disease)    History of chicken pox    History of pneumonia 2013   [facility]  LBBB (left bundle branch block)    Myotonic dystrophy, type 1 ([place]    followed by [facility]   Pneumonia    aspiration   Positive colorectal cancer screening using Cologuard test 03/31/2017   Sleep apnea    Past Surgical History:  Procedure Laterality Date   CARDIAC DEFIBRILLATOR PLACEMENT     defib/pacer   CARDIAC DEFIBRILLATOR PLACEMENT     COLONOSCOPY N/A 07/01/2020   Procedure: COLONOSCOPY;  Surgeon: TVirgel Manifold MD;  Location: [facility] ENDOSCOPY;  Service: Endoscopy;  Laterality: N/A;   COLONOSCOPY WITH PROPOFOL N/A 08/16/2016   Procedure: COLONOSCOPY WITH PROPOFOL;  Surgeon: AJonathon Bellows MD;  [facility];  Service: Gastroenterology;  Laterality: N/A;   COLONOSCOPY WITH PROPOFOL N/A 04/04/2017   Procedure: COLONOSCOPY WITH PROPOFOL;  Surgeon: AJonathon Bellows MD;  [facility];  Service: Gastroenterology;  Laterality: N/A;  PACEMAKER IMPLANT     US ECHOCARDIOGRAPHY  09/2010   EF 50%, mld LVH and dysfunction, mild RV dysfunction and enlarged   Family History  Problem Relation Age of Onset   Hypertension Father    Cancer Paternal Grandfather        lung, smoker   Cancer Paternal Uncle        bone   Diabetes Mother    CAD Mother 7       MI   Stroke Neg Hx    Social History   Socioeconomic History   Marital status: Married    Spouse name: Not on file   Number of children: Not on file   Years of education: Not on file   Highest education level: Not on file  Occupational History   Not on file  Tobacco Use   Smoking status: Never    Smokeless tobacco: Never  Vaping Use   Vaping Use: Never used  Substance and Sexual Activity   Alcohol use: No   Drug use: No   Sexual activity: Not on file  Other Topics Concern   Not on file  Social History Narrative   Lives with wife and step son, 4 cats   Occupation: on disability, 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[place]: [employer]   Activity: some walking on weekends   Diet: some water, fruits/vegetables daily         Social Determinants of [place] Strain: [place]  (01/26/2022)   Overall Financial Resource Strain (CARDIA)    Difficulty of Paying Living Expenses: Not hard at all  Food Insecurity: No Food Insecurity (01/26/2022)   Hunger Vital Sign    Worried About Running Out of Food in the Last Year: Never true    [place] in the Last Year: Never true  Transportation Needs: No Transportation Needs (01/26/2022)   PRAPARE - Hydrologist (Medical): No    Lack of Transportation (Non-Medical): No  Physical Activity: Not on file  Stress: No Stress Concern Present (01/26/2022)   [place]    Feeling of Stress : Not at all  Social Connections: Unknown (01/26/2022)   Social Connection and Isolation Panel [NHANES]    Frequency of Communication with Friends and Family: Not on file    Frequency of Social Gatherings with Friends and Family: Not on file    Attends Religious Services: Not on file    Active Member of Waves or Organizations: Not on file    Attends Archivist Meetings: Not on file    Marital Status: Married    Tobacco Counseling Counseling given: Not Answered   Clinical Intake:  Pre-visit preparation completed: Yes        Diabetes: No  How often do you need to have someone help you when you read instructions, pamphlets, or other written materials from your doctor or pharmacy?: 2 - Rarely    Interpreter Needed?:  No      Activities of Daily Living    01/26/2022    3:13 PM 02/05/2021   11:00 AM  In your present state of health, do you have any difficulty performing the following activities:  Hearing? 0 0  Vision? 0 0  Difficulty concentrating or making decisions? 0 0  Walking or climbing stairs? 1 0  Comment walker in use when ambulating   Dressing or bathing? 0 1  Doing  errands, shopping? 0 1  Comment Wife assist   Preparing Food and eating ? N   Using the Toilet? N   In the past six months, have you accidently leaked urine? N   Do you have problems with loss of bowel control? N   Managing your Medications? N   Managing your Finances? N   Housekeeping or managing your Housekeeping? N     Patient Care Team: Leone Haven, MD as PCP - General (Family Medicine)  Indicate any recent Medical Services you may have received from other than [facility] providers in the past year (date may be approximate).     Assessment:   This is a routine wellness examination for Sheldahl.  I connected with  [name] on 01/26/22 by a audio enabled telemedicine application and verified that I am speaking with the correct person using two identifiers.  Patient Location: Home  Provider Location: Office/Clinic  I discussed the limitations of evaluation and management by telemedicine. The patient expressed understanding and agreed to proceed.   Hearing/Vision screen Hearing Screening - Comments:: Patient is able to hear conversational tones without difficulty. No issues reported. Vision Screening - Comments:: Followed by [facility] Wears corrective lenses They have seen their ophthalmologist in the last 12 months.   Dietary issues and exercise activities discussed: Current Exercise Habits: Home exercise routine, Type of exercise: walking, Intensity: Mild  Regular diet   Goals Addressed               This Visit's Progress     Patient Stated     Follow up with Primary Care Provider  (pt-stated)        As needed.       Depression Screen    01/26/2022    3:10 PM 01/24/2022    2:26 PM 11/22/2019    1:17 PM 09/11/2019   11:09 AM 05/24/2019    3:31 PM 11/20/2018    4:04 PM 09/10/2018   12:20 PM  PHQ 2/9 Scores  PHQ - 2 Score 0 0 0 0 0 0 0    Fall Risk    01/26/2022    3:12 PM 01/24/2022    2:25 PM 11/22/2019    1:17 PM 09/11/2019   11:09 AM 05/24/2019    3:31 PM  Shelly in the past year? 0 0 0 0 1  Number falls in past yr: 0 0 0 0 0  Injury with Fall? 0 0  0 0  Risk for fall due to : Impaired balance/gait No Fall Risks     Follow up Falls evaluation completed Falls evaluation completed Falls evaluation completed Falls evaluation completed Falls evaluation completed    Victor: Home free of loose throw rugs in walkways, pet beds, electrical cords, etc? Yes  Adequate lighting in your home to reduce risk of falls? Yes   ASSISTIVE DEVICES UTILIZED TO PREVENT FALLS: Life alert? Yes  Use of a cane, walker or w/c? Yes  Grab bars in the bathroom? Yes  Shower chair or bench in shower? Yes  Elevated toilet seat or a handicapped toilet? Yes   TIMED UP AND GO: Was the test performed? No .   Cognitive Function:    05/19/2016    2:25 PM  MMSE - Mini Mental State Exam  Orientation to time 5  Orientation to Place 5  Registration 3  Attention/ Calculation 5  Recall 3  Language- name 2 objects  2  Language- repeat 1  Language- follow 3 step command 3  Language- read & follow direction 1  Write a sentence 1  Copy design 1  Total score 30        01/26/2022    3:17 PM 09/11/2019   11:13 AM 09/10/2018   12:41 PM  6CIT Screen  What Year? 0 points 0 points 0 points  What month? 0 points 0 points 0 points  What time? 0 points  0 points  Count back from 20 0 points 2 points 0 points  Months in reverse 0 points 0 points 0 points  Repeat phrase 0 points 0 points   Total Score 0 points       Immunizations Immunization History  Administered Date(s) Administered   Influenza Inj Mdck Quad Pf 12/22/2016   Influenza Split 12/27/2021   Influenza, Seasonal, Injecte, Preservative Fre 01/18/2013, 01/10/2014   Influenza,inj,Quad PF,6+ Mos 01/16/2015, 03/01/2016, 12/29/2017, 01/10/2020, 01/15/2021   Influenza-Unspecified 01/20/2012, 01/10/2014, 01/25/2015, 12/22/2016, 12/15/2018   Moderna Sars-Covid-2 Vaccination 01/24/2022   PFIZER Comirnaty(Gray Top)Covid-19 Tri-Sucrose Vaccine 08/22/2020   PFIZER(Purple Top)SARS-COV-2 Vaccination 01/25/2020   Pfizer Covid-19 Vaccine Bivalent Booster 64yr & up 01/27/2021   TDAP status: Due, Education has been provided regarding the importance of this vaccine. Advised may receive this vaccine at local pharmacy or Health Dept. Aware to provide a copy of the vaccination record if obtained from local pharmacy or Health Dept. Verbalized acceptance and understanding.  Shingrix Completed?: No.    Education has been provided regarding the importance of this vaccine. Patient has been advised to call insurance company to determine out of pocket expense if they have not yet received this vaccine. Advised may also receive vaccine at local pharmacy or Health Dept. Verbalized acceptance and understanding.  Screening Tests Health Maintenance  Topic Date Due   TETANUS/TDAP  03/28/2022 (Originally 01/18/1985)   Hepatitis C Screening  03/28/2022 (Originally 01/19/1984)   Zoster Vaccines- Shingrix (1 of 2) 04/28/2022 (Originally 01/18/1985)   COVID-19 Vaccine (5 - Pfizer risk series) 03/21/2022   Medicare Annual Wellness (AWV)  01/27/2023   COLONOSCOPY (Pts 45-471yrInsurance coverage will need to be confirmed)  07/02/2023   INFLUENZA VACCINE  Completed   HIV Screening  Completed   HPV VACCINES  Aged Out   Health Maintenance There are no preventive care reminders to display for this patient.   Lung Cancer Screening: (Low Dose CT Chest recommended if Age  56-80ears, 30 pack-year currently smoking OR have quit w/in 15years.) does not qualify.   Hepatitis C Screening: deferred per patient.   Vision Screening: Recommended annual ophthalmology exams for early detection of glaucoma and other disorders of the eye.  Dental Screening: Recommended annual dental exams for proper oral hygiene.  Community Resource Referral / Chronic Care Management: CRR required this visit?  No   CCM required this visit?  No      Plan:     I have personally reviewed and noted the following in the patient's chart:   Medical and social history Use of alcohol, tobacco or illicit drugs  Current medications and supplements including opioid prescriptions. Patient is not currently taking opioid prescriptions. Functional ability and status Nutritional status Physical activity Advanced directives List of other physicians Hospitalizations, surgeries, and ER visits in previous 12 months Vitals Screenings to include cognitive, depression, and falls Referrals and appointments  In addition, I have reviewed and discussed with patient certain preventive protocols, quality metrics, and best practice recommendations. A written personalized care plan  for preventive services as well as general preventive health recommendations were provided to patient.     Leta Jungling, LPN   74/08/27

## 2022-02-02 NOTE — Progress Notes (Signed)
Letter mailed to patients home after 3 attempts to reach by phone and mychart.  Benford Asch,cma

## 2022-04-15 DIAGNOSIS — Z95810 Presence of automatic (implantable) cardiac defibrillator: Secondary | ICD-10-CM | POA: Diagnosis not present

## 2022-04-15 DIAGNOSIS — I480 Paroxysmal atrial fibrillation: Secondary | ICD-10-CM | POA: Diagnosis not present

## 2022-05-17 DIAGNOSIS — I480 Paroxysmal atrial fibrillation: Secondary | ICD-10-CM | POA: Diagnosis not present

## 2022-05-17 DIAGNOSIS — Z95810 Presence of automatic (implantable) cardiac defibrillator: Secondary | ICD-10-CM | POA: Diagnosis not present

## 2022-05-23 DIAGNOSIS — G7109 Other specified muscular dystrophies: Secondary | ICD-10-CM | POA: Diagnosis not present

## 2022-05-23 DIAGNOSIS — R0600 Dyspnea, unspecified: Secondary | ICD-10-CM | POA: Diagnosis not present

## 2022-05-23 DIAGNOSIS — G4733 Obstructive sleep apnea (adult) (pediatric): Secondary | ICD-10-CM | POA: Diagnosis not present

## 2022-05-23 DIAGNOSIS — I429 Cardiomyopathy, unspecified: Secondary | ICD-10-CM | POA: Diagnosis not present

## 2022-05-23 DIAGNOSIS — R0609 Other forms of dyspnea: Secondary | ICD-10-CM | POA: Diagnosis not present

## 2022-06-02 DIAGNOSIS — R0602 Shortness of breath: Secondary | ICD-10-CM | POA: Diagnosis not present

## 2022-07-15 DIAGNOSIS — Z95810 Presence of automatic (implantable) cardiac defibrillator: Secondary | ICD-10-CM | POA: Diagnosis not present

## 2022-07-15 DIAGNOSIS — I480 Paroxysmal atrial fibrillation: Secondary | ICD-10-CM | POA: Diagnosis not present

## 2022-08-18 DIAGNOSIS — I480 Paroxysmal atrial fibrillation: Secondary | ICD-10-CM | POA: Diagnosis not present

## 2022-08-18 DIAGNOSIS — Z95810 Presence of automatic (implantable) cardiac defibrillator: Secondary | ICD-10-CM | POA: Diagnosis not present

## 2022-09-30 DIAGNOSIS — I420 Dilated cardiomyopathy: Secondary | ICD-10-CM | POA: Diagnosis not present

## 2022-09-30 DIAGNOSIS — Z95810 Presence of automatic (implantable) cardiac defibrillator: Secondary | ICD-10-CM | POA: Diagnosis not present

## 2022-10-22 DIAGNOSIS — I447 Left bundle-branch block, unspecified: Secondary | ICD-10-CM | POA: Diagnosis not present

## 2022-10-22 DIAGNOSIS — R002 Palpitations: Secondary | ICD-10-CM | POA: Diagnosis not present

## 2022-10-22 DIAGNOSIS — Z95810 Presence of automatic (implantable) cardiac defibrillator: Secondary | ICD-10-CM | POA: Diagnosis not present

## 2022-10-22 DIAGNOSIS — I429 Cardiomyopathy, unspecified: Secondary | ICD-10-CM | POA: Diagnosis not present

## 2022-10-22 DIAGNOSIS — I480 Paroxysmal atrial fibrillation: Secondary | ICD-10-CM | POA: Diagnosis not present

## 2022-10-22 DIAGNOSIS — R0609 Other forms of dyspnea: Secondary | ICD-10-CM | POA: Diagnosis not present

## 2022-10-22 DIAGNOSIS — R9431 Abnormal electrocardiogram [ECG] [EKG]: Secondary | ICD-10-CM | POA: Diagnosis not present

## 2022-12-09 DIAGNOSIS — K5909 Other constipation: Secondary | ICD-10-CM | POA: Diagnosis not present

## 2022-12-09 DIAGNOSIS — G7111 Myotonic muscular dystrophy: Secondary | ICD-10-CM | POA: Diagnosis not present

## 2022-12-09 DIAGNOSIS — G7109 Other specified muscular dystrophies: Secondary | ICD-10-CM | POA: Diagnosis not present

## 2022-12-09 DIAGNOSIS — R1319 Other dysphagia: Secondary | ICD-10-CM | POA: Diagnosis not present

## 2022-12-09 DIAGNOSIS — G7100 Muscular dystrophy, unspecified: Secondary | ICD-10-CM | POA: Diagnosis not present

## 2022-12-09 DIAGNOSIS — Z9181 History of falling: Secondary | ICD-10-CM | POA: Diagnosis not present

## 2022-12-09 DIAGNOSIS — G4733 Obstructive sleep apnea (adult) (pediatric): Secondary | ICD-10-CM | POA: Diagnosis not present

## 2022-12-09 DIAGNOSIS — Z7689 Persons encountering health services in other specified circumstances: Secondary | ICD-10-CM | POA: Diagnosis not present

## 2022-12-16 DIAGNOSIS — I2510 Atherosclerotic heart disease of native coronary artery without angina pectoris: Secondary | ICD-10-CM | POA: Diagnosis not present

## 2022-12-16 DIAGNOSIS — G7111 Myotonic muscular dystrophy: Secondary | ICD-10-CM | POA: Diagnosis not present

## 2022-12-16 DIAGNOSIS — J984 Other disorders of lung: Secondary | ICD-10-CM | POA: Diagnosis not present

## 2022-12-16 DIAGNOSIS — Z23 Encounter for immunization: Secondary | ICD-10-CM | POA: Diagnosis not present

## 2022-12-16 DIAGNOSIS — M6281 Muscle weakness (generalized): Secondary | ICD-10-CM | POA: Diagnosis not present

## 2022-12-16 DIAGNOSIS — R059 Cough, unspecified: Secondary | ICD-10-CM | POA: Diagnosis not present

## 2022-12-16 DIAGNOSIS — G4733 Obstructive sleep apnea (adult) (pediatric): Secondary | ICD-10-CM | POA: Diagnosis not present

## 2022-12-16 DIAGNOSIS — J9610 Chronic respiratory failure, unspecified whether with hypoxia or hypercapnia: Secondary | ICD-10-CM | POA: Diagnosis not present

## 2022-12-26 ENCOUNTER — Other Ambulatory Visit: Payer: Self-pay

## 2022-12-26 MED ORDER — COMIRNATY 30 MCG/0.3ML IM SUSY
0.3000 mL | PREFILLED_SYRINGE | INTRAMUSCULAR | 0 refills | Status: DC
  Filled 2022-12-26: qty 0.3, 1d supply, fill #0

## 2023-01-06 ENCOUNTER — Other Ambulatory Visit: Payer: Self-pay

## 2023-01-20 DIAGNOSIS — I480 Paroxysmal atrial fibrillation: Secondary | ICD-10-CM | POA: Diagnosis not present

## 2023-01-20 DIAGNOSIS — I447 Left bundle-branch block, unspecified: Secondary | ICD-10-CM | POA: Diagnosis not present

## 2023-01-20 DIAGNOSIS — Z95810 Presence of automatic (implantable) cardiac defibrillator: Secondary | ICD-10-CM | POA: Diagnosis not present

## 2023-01-20 DIAGNOSIS — G7111 Myotonic muscular dystrophy: Secondary | ICD-10-CM | POA: Diagnosis not present

## 2023-01-23 ENCOUNTER — Telehealth: Payer: Self-pay | Admitting: Family Medicine

## 2023-01-23 NOTE — Telephone Encounter (Signed)
Copied from CRM [id number]. Topic: Medicare AWV >> Jan 23, 2023  3:10 PM Payton Doughty wrote: Reason for CRM: Called 01/23/2023 to sched Annual Wellness Visit - NO VOICEMAIL  Verlee Rossetti; Care Guide Ambulatory Clinical Support [place] [facility] Direct Dial: [phone]

## 2023-01-23 NOTE — Telephone Encounter (Signed)
Copied from CRM [id number]. Topic: Medicare AWV >> Jan 23, 2023  3:14 PM Payton Doughty wrote: Reason for CRM: Called LVM 01/23/19 24 to schedule Annual Wellness Visit  Verlee Rossetti; Care Guide Ambulatory Clinical Support [place] [facility] Direct Dial: [phone]

## 2023-01-27 DIAGNOSIS — R053 Chronic cough: Secondary | ICD-10-CM | POA: Diagnosis not present

## 2023-01-27 DIAGNOSIS — J984 Other disorders of lung: Secondary | ICD-10-CM | POA: Diagnosis not present

## 2023-01-27 DIAGNOSIS — M6281 Muscle weakness (generalized): Secondary | ICD-10-CM | POA: Diagnosis not present

## 2023-01-27 DIAGNOSIS — J9610 Chronic respiratory failure, unspecified whether with hypoxia or hypercapnia: Secondary | ICD-10-CM | POA: Diagnosis not present

## 2023-01-27 DIAGNOSIS — G7111 Myotonic muscular dystrophy: Secondary | ICD-10-CM | POA: Diagnosis not present

## 2023-01-30 ENCOUNTER — Ambulatory Visit: Payer: Medicare HMO | Admitting: Family Medicine

## 2023-02-13 ENCOUNTER — Ambulatory Visit (INDEPENDENT_AMBULATORY_CARE_PROVIDER_SITE_OTHER): Payer: Medicare HMO | Admitting: Family Medicine

## 2023-02-13 ENCOUNTER — Encounter: Payer: Self-pay | Admitting: Family Medicine

## 2023-02-13 VITALS — BP 128/78 | HR 86 | Temp 97.8°F | Ht 69.0 in | Wt 208.8 lb

## 2023-02-13 DIAGNOSIS — F320 Major depressive disorder, single episode, mild: Secondary | ICD-10-CM | POA: Diagnosis not present

## 2023-02-13 DIAGNOSIS — R17 Unspecified jaundice: Secondary | ICD-10-CM | POA: Diagnosis not present

## 2023-02-13 DIAGNOSIS — I429 Cardiomyopathy, unspecified: Secondary | ICD-10-CM

## 2023-02-13 DIAGNOSIS — G4733 Obstructive sleep apnea (adult) (pediatric): Secondary | ICD-10-CM | POA: Diagnosis not present

## 2023-02-13 DIAGNOSIS — Z125 Encounter for screening for malignant neoplasm of prostate: Secondary | ICD-10-CM | POA: Diagnosis not present

## 2023-02-13 DIAGNOSIS — R7303 Prediabetes: Secondary | ICD-10-CM

## 2023-02-13 DIAGNOSIS — Z13220 Encounter for screening for lipoid disorders: Secondary | ICD-10-CM | POA: Diagnosis not present

## 2023-02-13 LAB — COMPREHENSIVE METABOLIC PANEL
ALT: 24 U/L (ref 0–53)
AST: 22 U/L (ref 0–37)
Albumin: 3.9 g/dL (ref 3.5–5.2)
Alkaline Phosphatase: 70 U/L (ref 39–117)
BUN: 9 mg/dL (ref 6–23)
CO2: 26 meq/L (ref 19–32)
Calcium: 9.2 mg/dL (ref 8.4–10.5)
Chloride: 107 meq/L (ref 96–112)
Creatinine, Ser: 0.74 mg/dL (ref 0.40–1.50)
GFR: 100.89 mL/min (ref >60.00)
Glucose, Bld: 148 mg/dL — ABNORMAL HIGH (ref 70–99)
Potassium: 3.5 meq/L (ref 3.5–5.1)
Sodium: 145 meq/L (ref 135–145)
Total Bilirubin: 1.4 mg/dL — ABNORMAL HIGH (ref 0.2–1.2)
Total Protein: 6.5 g/dL (ref 6.0–8.3)

## 2023-02-13 LAB — PSA, MEDICARE: PSA: 1.53 ng/mL (ref 0.10–4.00)

## 2023-02-13 LAB — LIPID PANEL
Cholesterol: 145 mg/dL (ref 0–200)
HDL: 34.1 mg/dL — ABNORMAL LOW (ref >39.00)
LDL Cholesterol: 72 mg/dL (ref 0–99)
NonHDL: 110.6
Total CHOL/HDL Ratio: 4
Triglycerides: 194 mg/dL — ABNORMAL HIGH (ref 0.0–149.0)
VLDL: 38.8 mg/dL (ref 0.0–40.0)

## 2023-02-13 LAB — HEMOGLOBIN A1C: Hgb A1c MFr Bld: 6.2 % (ref 4.6–6.5)

## 2023-02-13 MED ORDER — SERTRALINE HCL 50 MG PO TABS: 50.0000 mg | ORAL_TABLET | Freq: Every day | ORAL | 2 refills | Status: DC

## 2023-02-13 NOTE — Progress Notes (Signed)
Marikay Alar, MD Phone: [phone]  James [name] is a 57 y.o. male who presents today for f/u.  Cardiomyopathy: Patient notes an occasional central chest discomfort that he has discussed with cardiology.  They are monitoring this.  No shortness of breath or edema.  No orthopnea or PND.  Patient's wife does report that they have been told he has crackles in the bases of his lungs intermittently.  OSA: Patient is now on a noninvasive ventilator.  His wife reports they started this due to elevated CO2.  He has been sleeping better since they got the machine right.  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  He does follow with pulmonology for this.  Depression: Patient reports some depression.  He notes no SI.  Social History   Tobacco Use  Smoking Status Never  Smokeless Tobacco Never    Current Outpatient Medications on File Prior to Visit  Medication Sig Dispense Refill   acetaminophen (TYLENOL) 500 MG tablet Take 500-1,000 mg by mouth every 6 (six) hours as needed for moderate pain or headache.     albuterol (PROVENTIL) (2.5 MG/3ML) 0.083% nebulizer solution Take 3 mLs (2.5 mg total) by nebulization every 6 (six) hours as needed for wheezing or shortness of breath. 150 mL 1   COVID-19 mRNA vaccine, Pfizer, (COMIRNATY) syringe Inject 0.3 mLs into the muscle. 0.3 mL 0   docusate sodium (COLACE) 100 MG capsule Take 100 mg by mouth daily as needed for mild constipation.     Multiple Vitamin (MULTIVITAMIN) tablet Take 1 tablet by mouth daily.     Ondansetron HCl (ZOFRAN PO) Take 1 tablet by mouth every 8 (eight) hours as needed (nausea/vomiting).     No current facility-administered medications on file prior to visit.     ROS see history of present illness  Objective  Physical Exam Vitals:   02/13/23 1109  BP: 128/78  Pulse: 86  Temp: 97.8 F (36.6 C)  SpO2: 91%    BP Readings from Last 3 Encounters:  02/13/23 128/78  01/24/22 130/70  02/09/21 103/70   Wt Readings from Last 3  Encounters:  02/13/23 208 lb 12.8 oz (94.7 kg)  01/26/22 201 lb (91.2 kg)  01/24/22 201 lb 12.8 oz (91.5 kg)    Physical Exam Constitutional:      General: He is not in acute distress.    Appearance: He is not diaphoretic.  Cardiovascular:     Rate and Rhythm: Normal rate and regular rhythm.     Heart sounds: Normal heart sounds.  Pulmonary:     Effort: Pulmonary effort is normal.     Comments: Mild bibasilar crackles Skin:    General: Skin is warm and dry.  Neurological:     Mental Status: He is alert.      Assessment/Plan: Please see individual problem list.  Cardiomyopathy, unspecified type (HCC) Assessment & Plan: Chronic issue.  Stable symptomatically.  No signs of volume overload.  He will monitor for signs of volume overload such as shortness of breath, swelling, orthopnea or weight gain.  Weight appears to be generally stable recently based on review of notes from [facility].  Discussed crackles unlikely to be related to volume overload.  Patient notes he has not been ill recently so unlikely to be related to any pulmonary illness.  Suspect related to atelectasis or scarring.  He will monitor for additional symptoms.   Obstructive sleep apnea Assessment & Plan: Chronic issue.  He will continue his noninvasive ventilator.  This is managed by a  specialist at [facility].   Depression, major, single episode, mild (HCC) Assessment & Plan: Ongoing issue.  Will try Zoloft 50 mg daily.  He will return to see me in 6 to 8 weeks.  Discussed the risk of sexual dysfunction, weight gain, and sleep issues.  If he develops those or any other side effects he will let us know.  Orders: -     Sertraline HCl; Take 1 tablet (50 mg total) by mouth daily.  Dispense: 30 tablet; Refill: 2  Prostate cancer screening -     PSA, Medicare  Prediabetes -     Hemoglobin A1c  Lipid screening -     Comprehensive metabolic panel -     Lipid panel    Return in about 6 weeks (around 03/27/2023) for  depression with PCP, 6 months with new provider.   Marikay Alar, MD [facility]

## 2023-02-13 NOTE — Assessment & Plan Note (Signed)
Ongoing issue.  Will try Zoloft 50 mg daily.  He will return to see me in 6 to 8 weeks.  Discussed the risk of sexual dysfunction, weight gain, and sleep issues.  If he develops those or any other side effects he will let us know.

## 2023-02-13 NOTE — Assessment & Plan Note (Signed)
Chronic issue.  He will continue his noninvasive ventilator.  This is managed by a specialist at [facility].

## 2023-02-13 NOTE — Assessment & Plan Note (Addendum)
Chronic issue.  Stable symptomatically.  No signs of volume overload.  He will monitor for signs of volume overload such as shortness of breath, swelling, orthopnea or weight gain.  Weight appears to be generally stable recently based on review of notes from [facility].  Discussed crackles unlikely to be related to volume overload.  Patient notes he has not been ill recently so unlikely to be related to any pulmonary illness.  Suspect related to atelectasis or scarring.  He will monitor for additional symptoms.

## 2023-02-13 NOTE — Patient Instructions (Signed)
Nice to see you. Will try Zoloft for your depression.  If you notice any side effects please let us know.

## 2023-02-15 NOTE — Addendum Note (Signed)
Addended by: Prince Solian A on: 02/15/2023 04:16 PM   Modules accepted: Orders

## 2023-03-06 ENCOUNTER — Other Ambulatory Visit: Payer: Medicare HMO

## 2023-03-14 ENCOUNTER — Other Ambulatory Visit (INDEPENDENT_AMBULATORY_CARE_PROVIDER_SITE_OTHER): Payer: Medicare HMO

## 2023-03-14 DIAGNOSIS — R17 Unspecified jaundice: Secondary | ICD-10-CM

## 2023-03-14 LAB — COMPREHENSIVE METABOLIC PANEL
ALT: 20 U/L (ref 0–53)
AST: 20 U/L (ref 0–37)
Albumin: 3.7 g/dL (ref 3.5–5.2)
Alkaline Phosphatase: 65 U/L (ref 39–117)
BUN: 7 mg/dL (ref 6–23)
CO2: 35 meq/L — ABNORMAL HIGH (ref 19–32)
Calcium: 8.9 mg/dL (ref 8.4–10.5)
Chloride: 106 meq/L (ref 96–112)
Creatinine, Ser: 0.82 mg/dL (ref 0.40–1.50)
GFR: 97.76 mL/min (ref >60.00)
Glucose, Bld: 113 mg/dL — ABNORMAL HIGH (ref 70–99)
Potassium: 3.8 meq/L (ref 3.5–5.1)
Sodium: 145 meq/L (ref 135–145)
Total Bilirubin: 1.1 mg/dL (ref 0.2–1.2)
Total Protein: 6.6 g/dL (ref 6.0–8.3)

## 2023-03-27 ENCOUNTER — Encounter: Payer: Self-pay | Admitting: Family Medicine

## 2023-03-27 ENCOUNTER — Ambulatory Visit: Payer: Medicare HMO | Admitting: Family Medicine

## 2023-03-27 NOTE — Telephone Encounter (Signed)
Noted  

## 2023-03-28 DIAGNOSIS — G7111 Myotonic muscular dystrophy: Secondary | ICD-10-CM | POA: Diagnosis not present

## 2023-03-29 DIAGNOSIS — F32A Depression, unspecified: Secondary | ICD-10-CM | POA: Diagnosis not present

## 2023-03-29 DIAGNOSIS — J9621 Acute and chronic respiratory failure with hypoxia: Secondary | ICD-10-CM | POA: Diagnosis not present

## 2023-03-29 DIAGNOSIS — N132 Hydronephrosis with renal and ureteral calculous obstruction: Secondary | ICD-10-CM | POA: Diagnosis not present

## 2023-03-29 DIAGNOSIS — R112 Nausea with vomiting, unspecified: Secondary | ICD-10-CM | POA: Diagnosis not present

## 2023-03-29 DIAGNOSIS — Z9981 Dependence on supplemental oxygen: Secondary | ICD-10-CM | POA: Diagnosis not present

## 2023-03-29 DIAGNOSIS — K56600 Partial intestinal obstruction, unspecified as to cause: Secondary | ICD-10-CM | POA: Diagnosis not present

## 2023-03-29 DIAGNOSIS — E876 Hypokalemia: Secondary | ICD-10-CM | POA: Diagnosis not present

## 2023-03-29 DIAGNOSIS — R0609 Other forms of dyspnea: Secondary | ICD-10-CM | POA: Diagnosis not present

## 2023-03-29 DIAGNOSIS — I429 Cardiomyopathy, unspecified: Secondary | ICD-10-CM | POA: Diagnosis not present

## 2023-03-29 DIAGNOSIS — J9811 Atelectasis: Secondary | ICD-10-CM | POA: Diagnosis not present

## 2023-03-29 DIAGNOSIS — J984 Other disorders of lung: Secondary | ICD-10-CM | POA: Diagnosis not present

## 2023-03-29 DIAGNOSIS — Z87891 Personal history of nicotine dependence: Secondary | ICD-10-CM | POA: Diagnosis not present

## 2023-03-29 DIAGNOSIS — K760 Fatty (change of) liver, not elsewhere classified: Secondary | ICD-10-CM | POA: Diagnosis not present

## 2023-03-29 DIAGNOSIS — G7100 Muscular dystrophy, unspecified: Secondary | ICD-10-CM | POA: Diagnosis not present

## 2023-03-29 DIAGNOSIS — Z950 Presence of cardiac pacemaker: Secondary | ICD-10-CM | POA: Diagnosis not present

## 2023-03-29 DIAGNOSIS — E441 Mild protein-calorie malnutrition: Secondary | ICD-10-CM | POA: Diagnosis not present

## 2023-03-29 DIAGNOSIS — Z0189 Encounter for other specified special examinations: Secondary | ICD-10-CM | POA: Diagnosis not present

## 2023-03-29 DIAGNOSIS — R933 Abnormal findings on diagnostic imaging of other parts of digestive tract: Secondary | ICD-10-CM | POA: Diagnosis not present

## 2023-03-29 DIAGNOSIS — N201 Calculus of ureter: Secondary | ICD-10-CM | POA: Diagnosis not present

## 2023-03-29 DIAGNOSIS — K5989 Other specified functional intestinal disorders: Secondary | ICD-10-CM | POA: Diagnosis not present

## 2023-03-29 DIAGNOSIS — G7111 Myotonic muscular dystrophy: Secondary | ICD-10-CM | POA: Diagnosis not present

## 2023-03-29 DIAGNOSIS — R918 Other nonspecific abnormal finding of lung field: Secondary | ICD-10-CM | POA: Diagnosis not present

## 2023-03-29 DIAGNOSIS — R079 Chest pain, unspecified: Secondary | ICD-10-CM | POA: Diagnosis not present

## 2023-03-29 DIAGNOSIS — K56609 Unspecified intestinal obstruction, unspecified as to partial versus complete obstruction: Secondary | ICD-10-CM | POA: Diagnosis not present

## 2023-03-29 DIAGNOSIS — Z4659 Encounter for fitting and adjustment of other gastrointestinal appliance and device: Secondary | ICD-10-CM | POA: Diagnosis not present

## 2023-03-29 DIAGNOSIS — Z8701 Personal history of pneumonia (recurrent): Secondary | ICD-10-CM | POA: Diagnosis not present

## 2023-03-29 DIAGNOSIS — R0902 Hypoxemia: Secondary | ICD-10-CM | POA: Diagnosis not present

## 2023-03-29 DIAGNOSIS — R1310 Dysphagia, unspecified: Secondary | ICD-10-CM | POA: Diagnosis not present

## 2023-03-29 DIAGNOSIS — R188 Other ascites: Secondary | ICD-10-CM | POA: Diagnosis not present

## 2023-03-29 DIAGNOSIS — J90 Pleural effusion, not elsewhere classified: Secondary | ICD-10-CM | POA: Diagnosis not present

## 2023-03-30 DIAGNOSIS — J9621 Acute and chronic respiratory failure with hypoxia: Secondary | ICD-10-CM | POA: Diagnosis not present

## 2023-03-30 DIAGNOSIS — R079 Chest pain, unspecified: Secondary | ICD-10-CM | POA: Diagnosis not present

## 2023-03-30 DIAGNOSIS — K56609 Unspecified intestinal obstruction, unspecified as to partial versus complete obstruction: Secondary | ICD-10-CM | POA: Diagnosis not present

## 2023-03-30 DIAGNOSIS — R112 Nausea with vomiting, unspecified: Secondary | ICD-10-CM | POA: Diagnosis not present

## 2023-03-30 DIAGNOSIS — J984 Other disorders of lung: Secondary | ICD-10-CM | POA: Diagnosis not present

## 2023-03-30 DIAGNOSIS — N201 Calculus of ureter: Secondary | ICD-10-CM | POA: Diagnosis not present

## 2023-03-30 DIAGNOSIS — G7111 Myotonic muscular dystrophy: Secondary | ICD-10-CM | POA: Diagnosis not present

## 2023-03-30 DIAGNOSIS — K5989 Other specified functional intestinal disorders: Secondary | ICD-10-CM | POA: Diagnosis not present

## 2023-03-30 DIAGNOSIS — G7100 Muscular dystrophy, unspecified: Secondary | ICD-10-CM | POA: Diagnosis not present

## 2023-03-31 DIAGNOSIS — J984 Other disorders of lung: Secondary | ICD-10-CM | POA: Diagnosis not present

## 2023-03-31 DIAGNOSIS — R112 Nausea with vomiting, unspecified: Secondary | ICD-10-CM | POA: Diagnosis not present

## 2023-03-31 DIAGNOSIS — R933 Abnormal findings on diagnostic imaging of other parts of digestive tract: Secondary | ICD-10-CM | POA: Diagnosis not present

## 2023-03-31 DIAGNOSIS — J9621 Acute and chronic respiratory failure with hypoxia: Secondary | ICD-10-CM | POA: Diagnosis not present

## 2023-03-31 DIAGNOSIS — R079 Chest pain, unspecified: Secondary | ICD-10-CM | POA: Diagnosis not present

## 2023-03-31 DIAGNOSIS — G7100 Muscular dystrophy, unspecified: Secondary | ICD-10-CM | POA: Diagnosis not present

## 2023-03-31 DIAGNOSIS — K56609 Unspecified intestinal obstruction, unspecified as to partial versus complete obstruction: Secondary | ICD-10-CM | POA: Diagnosis not present

## 2023-03-31 DIAGNOSIS — G7111 Myotonic muscular dystrophy: Secondary | ICD-10-CM | POA: Diagnosis not present

## 2023-04-01 DIAGNOSIS — G7111 Myotonic muscular dystrophy: Secondary | ICD-10-CM | POA: Diagnosis not present

## 2023-04-01 DIAGNOSIS — R0902 Hypoxemia: Secondary | ICD-10-CM | POA: Diagnosis not present

## 2023-04-01 DIAGNOSIS — R0609 Other forms of dyspnea: Secondary | ICD-10-CM | POA: Diagnosis not present

## 2023-04-01 DIAGNOSIS — R112 Nausea with vomiting, unspecified: Secondary | ICD-10-CM | POA: Diagnosis not present

## 2023-04-01 DIAGNOSIS — K56609 Unspecified intestinal obstruction, unspecified as to partial versus complete obstruction: Secondary | ICD-10-CM | POA: Diagnosis not present

## 2023-04-01 DIAGNOSIS — J90 Pleural effusion, not elsewhere classified: Secondary | ICD-10-CM | POA: Diagnosis not present

## 2023-04-01 DIAGNOSIS — R079 Chest pain, unspecified: Secondary | ICD-10-CM | POA: Diagnosis not present

## 2023-04-02 DIAGNOSIS — R0609 Other forms of dyspnea: Secondary | ICD-10-CM | POA: Diagnosis not present

## 2023-04-02 DIAGNOSIS — K56609 Unspecified intestinal obstruction, unspecified as to partial versus complete obstruction: Secondary | ICD-10-CM | POA: Diagnosis not present

## 2023-04-02 DIAGNOSIS — G7111 Myotonic muscular dystrophy: Secondary | ICD-10-CM | POA: Diagnosis not present

## 2023-04-02 DIAGNOSIS — R079 Chest pain, unspecified: Secondary | ICD-10-CM | POA: Diagnosis not present

## 2023-04-02 DIAGNOSIS — R112 Nausea with vomiting, unspecified: Secondary | ICD-10-CM | POA: Diagnosis not present

## 2023-04-02 DIAGNOSIS — R0902 Hypoxemia: Secondary | ICD-10-CM | POA: Diagnosis not present

## 2023-04-03 ENCOUNTER — Telehealth: Payer: Self-pay

## 2023-04-03 NOTE — Patient Outreach (Signed)
 Care Management  Transitions of Care Program Transitions of Care Post-discharge Initial    04/03/2023 Name: James [name] MRN: [id number] DOB: Jan 14, 1966  Subjective: James [name] is a 58 y.o. year old male who is a primary care patient of Maribeth, Camellia MATSU, MD. The Care Management team Engaged with patient Engaged with patient by telephone to assess and address transitions of care needs.   Consent to Services:  Patient was given information about care management services, agreed to services, and gave verbal consent to participate.   Assessment:  Date of Discharge: 04/02/23 Discharge Facility: Other ([facility]) Type of Discharge: Inpatient Admission Primary Inpatient Discharge Diagnosis:: SBO  SDOH Interventions    Flowsheet Row Telephone from 04/03/2023 in [place] POPULATION HEALTH DEPARTMENT Clinical Support from 01/26/2022 in [facility] [employer] Patient Outreach Telephone from 11/09/2021 in [facility]  SDOH Interventions     Food Insecurity Interventions Intervention Not Indicated Intervention Not Indicated Intervention Not Indicated  Housing Interventions Intervention Not Indicated Intervention Not Indicated --  Transportation Interventions Intervention Not Indicated Intervention Not Indicated Intervention Not Indicated  Utilities Interventions Intervention Not Indicated Intervention Not Indicated --  Financial Strain Interventions -- Intervention Not Indicated --  Stress Interventions -- Intervention Not Indicated --  Social Connections Interventions -- Intervention Not Indicated --        Goals Addressed             This Visit's Progress    TOC Care Plan       Current Barriers:  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, Constipation   RNCM Clinical Goal(s):  Patient will work with the Care Management team over the next 30 days to address Transition of Care Barriers:  Medication access Medication Management Diet/Nutrition/Food Resources Support at home Provider appointments Home Health services Equipment/DME Functional/Safety verbalize understanding of plan for management of Myotonic Dystrophy and Constipation as evidenced by Verbal feedback, no ED or hospital Re-admission take all medications exactly as prescribed and will call provider for medication related questions as evidenced by verbal feedback  through collaboration with RN Care manager, provider, and care team.   Interventions: Evaluation of current treatment plan related to  self management and patient's adherence to plan as established by provider        (Status:  New goal.)  Short Term Goal Evaluation of current treatment plan related to  Myotonic Dystrophy and Constipation , Level of care concerns and ADL IADL limitations self-management and patient's adherence to plan as established by provider. Discussed plans with patient for ongoing care management follow up and provided patient with direct contact information for care management team Provided education to patient re: Soft Diet, Protein shake supplements, Constipation Reviewed medications with patient and discussed New medications and oxygen     requirement Discussed plans with patient for ongoing care management follow up and provided patient with direct contact information for care management team Screening for signs and symptoms of depression related to chronic disease state  Assessed social determinant of health barriers  Patient Goals/Self-Care Activities: Participate in Transition of Care Program/Attend [facility] scheduled calls Notify RN Care Manager of TOC call rescheduling needs Take all medications as prescribed Attend all scheduled provider appointments Call pharmacy for medication refills 3-7 days in advance of running out of medications Call provider office for new concerns or questions   Follow Up Plan:  Telephone follow up  appointment with care management team member scheduled for:  Wednesday  January 15th at 9:30am        [facility] Outreach completed today. Talked with the patient's spouse, James [name] who is his primary caregiver. The patient's MD is slowly progressing. He needs assistance with transfers, and dressing and bathing. He also needs some assistance with eating. He was admitted for SBO which resolved. He is on a soft diet and has been recommended to take Miralax  and Senna for complaints of constipation. The patent does wear depends due to some bowel incontinence. He is having some swallowing difficulties but the patient states he will not have a PEG tube when the time comes when he can't swallow. HHPT arranged with [facility]. The patient is now on Oxygen  1L/min continuous. He uses a Rollator for short distances and has a power wheelchair and ramp when he leaves the house. The patient is enrolled in the [facility] 30 day program. The spouse states she can use the support as she is his caregiver and she doesn't have assistance.  Please refer to Care Plan for goals and interventions.  Patient educated on red flags signs/symptoms to watch for and was encouraged to report any of these identified, any new symptoms, changes in baseline or medication regimen, change in health status / well-being, or safety concerns to PCP and / or the [facility] Case Management team.   Patient is at high risk for readmission and/or has history of high utilization of ED.  Discussed [facility] TOC program and weekly calls to patient to assess condition/status, medication management and provide support/education as indicated. Patient/ Caregiver voiced understanding and is agreeable to 30 day program    Medford Balboa, Programmer, Systems [facility] [phone]

## 2023-04-11 ENCOUNTER — Encounter: Payer: Self-pay | Admitting: Family Medicine

## 2023-04-11 ENCOUNTER — Ambulatory Visit (INDEPENDENT_AMBULATORY_CARE_PROVIDER_SITE_OTHER): Payer: Medicare HMO | Admitting: Family Medicine

## 2023-04-11 VITALS — BP 98/66 | HR 85 | Temp 98.0°F | Ht 69.0 in | Wt 202.0 lb

## 2023-04-11 DIAGNOSIS — H9203 Otalgia, bilateral: Secondary | ICD-10-CM

## 2023-04-11 DIAGNOSIS — D696 Thrombocytopenia, unspecified: Secondary | ICD-10-CM | POA: Diagnosis not present

## 2023-04-11 DIAGNOSIS — K56600 Partial intestinal obstruction, unspecified as to cause: Secondary | ICD-10-CM

## 2023-04-11 DIAGNOSIS — R0902 Hypoxemia: Secondary | ICD-10-CM | POA: Diagnosis not present

## 2023-04-11 DIAGNOSIS — G7111 Myotonic muscular dystrophy: Secondary | ICD-10-CM

## 2023-04-11 DIAGNOSIS — N200 Calculus of kidney: Secondary | ICD-10-CM | POA: Diagnosis not present

## 2023-04-11 DIAGNOSIS — E876 Hypokalemia: Secondary | ICD-10-CM

## 2023-04-11 LAB — CBC WITH DIFFERENTIAL/PLATELET
Basophils Absolute: 0.1 10*3/uL (ref 0.0–0.1)
Basophils Relative: 0.9 % (ref 0.0–3.0)
Eosinophils Absolute: 0.2 10*3/uL (ref 0.0–0.7)
Eosinophils Relative: 3 % (ref 0.0–5.0)
HCT: 45.5 % (ref 39.0–52.0)
Hemoglobin: 15.2 g/dL (ref 13.0–17.0)
Lymphocytes Relative: 30.8 % (ref 12.0–46.0)
Lymphs Abs: 1.9 10*3/uL (ref 0.7–4.0)
MCHC: 33.4 g/dL (ref 30.0–36.0)
MCV: 95.5 fL (ref 78.0–100.0)
Monocytes Absolute: 0.5 10*3/uL (ref 0.1–1.0)
Monocytes Relative: 9.1 % (ref 3.0–12.0)
Neutro Abs: 3.4 10*3/uL (ref 1.4–7.7)
Neutrophils Relative %: 56.2 % (ref 43.0–77.0)
Platelets: 231 10*3/uL (ref 150.0–400.0)
RBC: 4.76 Mil/uL (ref 4.22–5.81)
RDW: 13.8 % (ref 11.5–15.5)
WBC: 6 10*3/uL (ref 4.0–10.5)

## 2023-04-11 LAB — BASIC METABOLIC PANEL
BUN: 7 mg/dL (ref 6–23)
CO2: 28 meq/L (ref 19–32)
Calcium: 9.1 mg/dL (ref 8.4–10.5)
Chloride: 107 meq/L (ref 96–112)
Creatinine, Ser: 0.71 mg/dL (ref 0.40–1.50)
GFR: 102.05 mL/min (ref >60.00)
Glucose, Bld: 111 mg/dL — ABNORMAL HIGH (ref 70–99)
Potassium: 3.5 meq/L (ref 3.5–5.1)
Sodium: 143 meq/L (ref 135–145)

## 2023-04-11 NOTE — Assessment & Plan Note (Signed)
 Resolved.  Patient will monitor for recurrent symptoms and if they recur he will be evaluated right away.

## 2023-04-11 NOTE — Assessment & Plan Note (Signed)
 New issue.  Patient will continue Flomax 0.4 mg daily.  He will see urology as planned.  He will let us or urology know if he develops pain related to passing a kidney stone.

## 2023-04-11 NOTE — Assessment & Plan Note (Signed)
 Resolved.  Possibly related to abdominal distention compressing his thoracic cavity.  He will monitor oxygen levels at home.

## 2023-04-11 NOTE — Assessment & Plan Note (Signed)
 Chronic issue.  He will continue to follow with [facility] for this.

## 2023-04-11 NOTE — Assessment & Plan Note (Signed)
 Chronic issue.  Benign exam.  Discussed this could be eustachian tube dysfunction.  Discussed use of Flonase though he declines.  He will monitor.

## 2023-04-11 NOTE — Progress Notes (Signed)
 James Her, MD Phone: [phone]  James [name] is a 58 y.o. male who presents today for f/u.  Small bowel obstruction: Patient was hospitalized recently after developing abdominal pain and nausea and vomiting.  He was found to have a partial small bowel obstruction.  Patient's wife reports the physicians felt this was related to his myotonic dystrophy.  No history of surgeries or radiation on his abdomen.  He did well and progressed to where the NG tube could be taken out.  He notes no abdominal pain, nausea, vomiting, or diarrhea since discharge from the hospital.  He was also found to be hypoxic.  He was on oxygen .  Per the discharge summary it was felt that the distention in his abdomen was contributing to the hypoxia.  He has not required oxygen  since going home.  He was incidentally found to have a kidney stone.  He is placed on Flomax.  He has not passed a kidney stone yet.  He is scheduled to see urology.  Ear pain: Patient notes his ears feel funny at times.  He notes no ear fullness or hearing loss.  Social History   Tobacco Use  Smoking Status Never  Smokeless Tobacco Never    Current Outpatient Medications on File Prior to Visit  Medication Sig Dispense Refill   acetaminophen  (TYLENOL ) 500 MG tablet Take 500-1,000 mg by mouth every 6 (six) hours as needed for moderate pain or headache.     albuterol  (PROVENTIL ) (2.5 MG/3ML) 0.083% nebulizer solution Take 3 mLs (2.5 mg total) by nebulization every 6 (six) hours as needed for wheezing or shortness of breath. 150 mL 1   COVID-19 mRNA vaccine, Pfizer, (COMIRNATY ) syringe Inject 0.3 mLs into the muscle. 0.3 mL 0   docusate sodium  (COLACE) 100 MG capsule Take 100 mg by mouth daily as needed for mild constipation.     Multiple Vitamin (MULTIVITAMIN) tablet Take 1 tablet by mouth daily.     Ondansetron  HCl (ZOFRAN  PO) Take 1 tablet by mouth every 8 (eight) hours as needed (nausea/vomiting).     OXYGEN  Inhale 1 L/min into the  lungs continuous.     polyethylene glycol (MIRALAX  / GLYCOLAX ) 17 g packet Take 17 g by mouth daily as needed.     senna (SENOKOT) 8.6 MG TABS tablet Take 2 tablets by mouth daily as needed for mild constipation.     sertraline  (ZOLOFT ) 50 MG tablet Take 1 tablet (50 mg total) by mouth daily. 30 tablet 2   tamsulosin (FLOMAX) 0.4 MG CAPS capsule Take 0.4 mg by mouth.     No current facility-administered medications on file prior to visit.     ROS see history of present illness  Objective  Physical Exam Vitals:   04/11/23 1006  BP: 98/66  Pulse: 85  Temp: 98 F (36.7 C)  SpO2: 95%    BP Readings from Last 3 Encounters:  04/11/23 98/66  02/13/23 128/78  01/24/22 130/70   Wt Readings from Last 3 Encounters:  04/11/23 202 lb (91.6 kg)  02/13/23 208 lb 12.8 oz (94.7 kg)  01/26/22 201 lb (91.2 kg)    Physical Exam Constitutional:      General: He is not in acute distress.    Appearance: He is not diaphoretic.  HENT:     Right Ear: Tympanic membrane and ear canal normal.     Left Ear: Tympanic membrane and ear canal normal.  Cardiovascular:     Heart sounds: Normal heart sounds.  Pulmonary:  Effort: Pulmonary effort is normal.     Breath sounds: Normal breath sounds.  Abdominal:     General: Bowel sounds are normal. There is no distension.     Palpations: Abdomen is soft.     Tenderness: There is no abdominal tenderness.  Skin:    General: Skin is warm and dry.  Neurological:     Mental Status: He is alert.      Assessment/Plan: Please see individual problem list.  Partial small bowel obstruction (HCC) Assessment & Plan: Resolved.  Patient will monitor for recurrent symptoms and if they recur he will be evaluated right away.   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  He will continue to follow with [facility] for  this.   Hypoxia Assessment & Plan: Resolved.  Possibly related to abdominal distention compressing his thoracic cavity.  He will monitor oxygen  levels at home.   Ear pain, bilateral Assessment & Plan: Chronic issue.  Benign exam.  Discussed this could be eustachian tube dysfunction.  Discussed use of Flonase though he declines.  He will monitor.   Nephrolithiasis Assessment & Plan: New issue.  Patient will continue Flomax 0.4 mg daily.  He will see urology as planned.  He will let us  or urology know if he develops pain related to passing a kidney stone.     Return for as scheduled with [facility] provider.   James Her, MD [facility]

## 2023-04-12 ENCOUNTER — Other Ambulatory Visit: Payer: Self-pay
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
## 2023-04-14 DIAGNOSIS — G4733 Obstructive sleep apnea (adult) (pediatric): Secondary | ICD-10-CM | POA: Diagnosis not present

## 2023-04-14 DIAGNOSIS — I480 Paroxysmal atrial fibrillation: Secondary | ICD-10-CM | POA: Diagnosis not present

## 2023-04-14 DIAGNOSIS — I429 Cardiomyopathy, unspecified: Secondary | ICD-10-CM | POA: Diagnosis not present

## 2023-04-14 DIAGNOSIS — Z95810 Presence of automatic (implantable) cardiac defibrillator: Secondary | ICD-10-CM | POA: Diagnosis not present

## 2023-04-14 DIAGNOSIS — R0609 Other forms of dyspnea: Secondary | ICD-10-CM | POA: Diagnosis not present

## 2023-04-14 DIAGNOSIS — R002 Palpitations: Secondary | ICD-10-CM | POA: Diagnosis not present

## 2023-04-14 DIAGNOSIS — R9431 Abnormal electrocardiogram [ECG] [EKG]: Secondary | ICD-10-CM | POA: Diagnosis not present

## 2023-04-14 DIAGNOSIS — I447 Left bundle-branch block, unspecified: Secondary | ICD-10-CM | POA: Diagnosis not present

## 2023-04-17 DIAGNOSIS — I429 Cardiomyopathy, unspecified: Secondary | ICD-10-CM | POA: Diagnosis not present

## 2023-04-17 DIAGNOSIS — J90 Pleural effusion, not elsewhere classified: Secondary | ICD-10-CM | POA: Diagnosis not present

## 2023-04-17 DIAGNOSIS — J984 Other disorders of lung: Secondary | ICD-10-CM | POA: Diagnosis not present

## 2023-04-17 DIAGNOSIS — R1310 Dysphagia, unspecified: Secondary | ICD-10-CM | POA: Diagnosis not present

## 2023-04-17 DIAGNOSIS — E46 Unspecified protein-calorie malnutrition: Secondary | ICD-10-CM | POA: Diagnosis not present

## 2023-04-17 DIAGNOSIS — G7111 Myotonic muscular dystrophy: Secondary | ICD-10-CM | POA: Diagnosis not present

## 2023-04-17 DIAGNOSIS — I447 Left bundle-branch block, unspecified: Secondary | ICD-10-CM | POA: Diagnosis not present

## 2023-04-17 DIAGNOSIS — J9621 Acute and chronic respiratory failure with hypoxia: Secondary | ICD-10-CM | POA: Diagnosis not present

## 2023-04-17 DIAGNOSIS — K2970 Gastritis, unspecified, without bleeding: Secondary | ICD-10-CM | POA: Diagnosis not present

## 2023-04-19 ENCOUNTER — Other Ambulatory Visit: Payer: Self-pay

## 2023-04-19 NOTE — Patient Outreach (Signed)
Care Management  Transitions of Care Program Transitions of Care Post-discharge week 3   04/19/2023 Name: James [name] MRN: [id number] DOB: [birth year]  Subjective: James [name] is a 58 y.o. year old male who is a primary care patient of Birdie Sons, Yehuda Mao, MD. The Care Management team Engaged with patient Engaged with patient by telephone to assess and address transitions of care needs.   Consent to Services:  Patient was given information about care management services, agreed to services, and gave verbal consent to participate.   Assessment: TOC Outreach today. The patient's spouse answers the phone. She states that HHPT started last week and is coming twice a week. The patient is doing well, on a bowel program to hopefully prevent further SBO's although with his Myotonic dystrophy it is still possible. He is not taking medication for Depression. His spouse states his mood is usually good but he can have some times where he is depressed. Offered resources for counseling which she declines. RNCM to follow up next week and then transfer to CCM if agreeable.     SDOH Interventions    Flowsheet Row Patient Outreach from 04/12/2023 in [place] POPULATION HEALTH DEPARTMENT Office Visit from 04/11/2023 in [facility] Telephone from 04/03/2023 in [place] POPULATION HEALTH DEPARTMENT Clinical Support from 01/26/2022 in [facility] [place] [facility] Patient Outreach Telephone from 11/09/2021 in [facility]  SDOH Interventions       Food Insecurity Interventions Intervention Not Indicated -- Intervention Not Indicated Intervention Not Indicated Intervention Not Indicated  Housing Interventions Intervention Not Indicated -- Intervention Not Indicated Intervention Not Indicated --  Transportation Interventions Intervention Not Indicated -- Intervention Not Indicated Intervention Not Indicated Intervention Not Indicated   Utilities Interventions Intervention Not Indicated -- Intervention Not Indicated Intervention Not Indicated --  Depression Interventions/Treatment  -- Patient refuses Treatment -- -- --  Financial Strain Interventions -- -- -- Intervention Not Indicated --  Stress Interventions -- -- -- Intervention Not Indicated --  Social Connections Interventions Intervention Not Indicated -- -- Intervention Not Indicated --        Goals Addressed             This Visit's Progress    TOC Care Plan       Current Barriers:  (reviewed 04/19/23) 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, Constipation   RNCM Clinical Goal(s): Reviewed 04/19/23 Patient will work with the Care Management team over the next 30 days to address Transition of Care Barriers: Medication access Medication Management Diet/Nutrition/Food Resources Support at home Provider appointments Home Health services Equipment/DME Functional/Safety verbalize understanding of plan for management of Myotonic Dystrophy and Constipation as evidenced by Verbal feedback, no ED or hospital Re-admission take all medications exactly as prescribed and will call provider for medication related questions as evidenced by verbal feedback  through collaboration with RN Care manager, provider, and care team.   Interventions: Reviewed 04/19/23 Evaluation of current treatment plan related to  self management and patient's adherence to plan as established by provider        (Status:  New goal.)  Short Term Goal Evaluation of current treatment plan related to  Myotonic Dystrophy and Constipation , Level of care concerns and ADL IADL limitations self-management and patient's adherence to plan as established by provider. Discussed plans with patient for ongoing care management follow up and provided patient with direct contact information for care management team Provided education  to patient re: Soft Diet, Protein  shake supplements, Constipation Reviewed medications with patient and discussed New medications and oxygen    requirement Discussed plans with patient for ongoing care management follow up and provided patient with direct contact information for care management team Screening for signs and symptoms of depression related to chronic disease state  Assessed social determinant of health barriers  Patient Goals/Self-Care Activities: Reviewed 04/19/23 Participate in Transition of Care Program/Attend [facility] scheduled calls Notify RN Care Manager of [facility] call rescheduling needs Take all medications as prescribed Attend all scheduled provider appointments Call pharmacy for medication refills 3-7 days in advance of running out of medications Call provider office for new concerns or questions   Follow Up Plan:  Telephone follow up appointment with care management team member scheduled for:  Wednesday January 28th at 10:00am         Please refer to Care Plan for goals and interventions.  Patient educated on red flags signs/symptoms to watch for and was encouraged to report any of these identified, any new symptoms, changes in baseline or medication regimen, change in health status / well-being, or safety concerns to PCP and / or the [facility] Case Management team.   Deidre Ala, RN RN Care Manager [facility]Population [facility] [phone]

## 2023-04-19 NOTE — Patient Instructions (Signed)
Visit Information  Thank you for taking time to visit with me today. Please don't hesitate to contact me if I can be of assistance to you before our next scheduled telephone appointment.   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, Constipation   RNCM Clinical Goal(s): Reviewed 04/19/23 Patient will work with the Care Management team over the next 30 days to address Transition of Care Barriers: Medication access Medication Management Diet/Nutrition/Food Resources Support at home Provider appointments Home Health services Equipment/DME Functional/Safety verbalize understanding of plan for management of Myotonic Dystrophy and Constipation as evidenced by Verbal feedback, no ED or hospital Re-admission take all medications exactly as prescribed and will call provider for medication related questions as evidenced by verbal feedback  through collaboration with RN Care manager, provider, and care team.   Interventions: Reviewed 04/19/23 Evaluation of current treatment plan related to  self management and patient's adherence to plan as established by provider        (Status:  New goal.)  Short Term Goal Evaluation of current treatment plan related to  Myotonic Dystrophy and Constipation , Level of care concerns and ADL IADL limitations self-management and patient's adherence to plan as established by provider. Discussed plans with patient for ongoing care management follow up and provided patient with direct contact information for care management team Provided education to patient re: Soft Diet, Protein shake supplements, Constipation Reviewed medications with patient and discussed New medications and oxygen    requirement Discussed plans with patient for ongoing care management follow up and  provided patient with direct contact information for care management team Screening for signs and symptoms of depression related to chronic disease state  Assessed social determinant of health barriers  Patient Goals/Self-Care Activities: Reviewed 04/19/23 Participate in Transition of Care Program/Attend [facility] scheduled calls Notify RN Care Manager of [facility] call rescheduling needs Take all medications as prescribed Attend all scheduled provider appointments Call pharmacy for medication refills 3-7 days in advance of running out of medications Call provider office for new concerns or questions   Follow Up Plan:  Telephone follow up appointment with care management team member scheduled for:  Wednesday January 28th at 10:00am          Patient verbalizes understanding of instructions and care plan provided today and agrees to view in MyChart. Active MyChart status and patient understanding of how to access instructions and care plan via MyChart confirmed with patient.     The patient has been provided with contact information for the care management team and has been advised to call with any health related questions or concerns.   Please call the care guide team at [phone] if you need to cancel or reschedule your appointment.   Please call the Suicide and Crisis Lifeline: 988 call the [place] National Suicide Prevention Lifeline: 5022053643 or TTY: (360)625-5053 TTY 401-177-9264) to talk to a trained counselor if you are experiencing a Mental Health or Behavioral Health Crisis or need someone to talk to.  Deidre Ala, RN Medical illustrator VBCI-Population Health [phone]

## 2023-04-20 ENCOUNTER — Telehealth: Payer: Self-pay

## 2023-04-20 DIAGNOSIS — J90 Pleural effusion, not elsewhere classified: Secondary | ICD-10-CM | POA: Diagnosis not present

## 2023-04-20 DIAGNOSIS — J9621 Acute and chronic respiratory failure with hypoxia: Secondary | ICD-10-CM | POA: Diagnosis not present

## 2023-04-20 DIAGNOSIS — E46 Unspecified protein-calorie malnutrition: Secondary | ICD-10-CM | POA: Diagnosis not present

## 2023-04-20 DIAGNOSIS — G7111 Myotonic muscular dystrophy: Secondary | ICD-10-CM | POA: Diagnosis not present

## 2023-04-20 DIAGNOSIS — R1310 Dysphagia, unspecified: Secondary | ICD-10-CM | POA: Diagnosis not present

## 2023-04-20 DIAGNOSIS — J984 Other disorders of lung: Secondary | ICD-10-CM | POA: Diagnosis not present

## 2023-04-20 DIAGNOSIS — K2970 Gastritis, unspecified, without bleeding: Secondary | ICD-10-CM | POA: Diagnosis not present

## 2023-04-20 DIAGNOSIS — I447 Left bundle-branch block, unspecified: Secondary | ICD-10-CM | POA: Diagnosis not present

## 2023-04-20 DIAGNOSIS — I429 Cardiomyopathy, unspecified: Secondary | ICD-10-CM | POA: Diagnosis not present

## 2023-04-20 NOTE — Telephone Encounter (Signed)
Copied from CRM [id number]. Topic: Clinical - Home Health Verbal Orders >> Apr 20, 2023  9:21 AM Benetta Spar A wrote: Caller/Agency: Dora Sims Number: Fax Number [phone] Service Requested: Physical Therapy Frequency: 2 times per week for 4 weeks Any new concerns about the patient? No

## 2023-04-20 NOTE — Telephone Encounter (Signed)
Verbal orders can be given for home health PT.

## 2023-04-21 NOTE — Telephone Encounter (Signed)
Called the number given and it is a fax number to way to leave verbal orders.

## 2023-04-24 DIAGNOSIS — J9621 Acute and chronic respiratory failure with hypoxia: Secondary | ICD-10-CM | POA: Diagnosis not present

## 2023-04-24 DIAGNOSIS — J984 Other disorders of lung: Secondary | ICD-10-CM | POA: Diagnosis not present

## 2023-04-24 DIAGNOSIS — I429 Cardiomyopathy, unspecified: Secondary | ICD-10-CM | POA: Diagnosis not present

## 2023-04-24 DIAGNOSIS — J90 Pleural effusion, not elsewhere classified: Secondary | ICD-10-CM | POA: Diagnosis not present

## 2023-04-24 DIAGNOSIS — G7111 Myotonic muscular dystrophy: Secondary | ICD-10-CM | POA: Diagnosis not present

## 2023-04-24 DIAGNOSIS — E46 Unspecified protein-calorie malnutrition: Secondary | ICD-10-CM | POA: Diagnosis not present

## 2023-04-24 DIAGNOSIS — R1310 Dysphagia, unspecified: Secondary | ICD-10-CM | POA: Diagnosis not present

## 2023-04-24 DIAGNOSIS — I447 Left bundle-branch block, unspecified: Secondary | ICD-10-CM | POA: Diagnosis not present

## 2023-04-24 DIAGNOSIS — K2970 Gastritis, unspecified, without bleeding: Secondary | ICD-10-CM | POA: Diagnosis not present

## 2023-04-26 ENCOUNTER — Other Ambulatory Visit: Payer: Self-pay

## 2023-04-26 NOTE — Patient Outreach (Signed)
Care Management  Transitions of Care Program Transitions of Care Post-discharge week 4   04/26/2023 Name: James [name] MRN: [id number] DOB: 10-30-1965  Subjective: James [name] is a 58 y.o. year old male who is a primary care patient of Birdie Sons, Yehuda Mao, MD. The Care Management team Engaged with patient Engaged with patient by telephone to assess and address transitions of care needs.   Consent to Services:  Patient was given information about care management services, agreed to services, and gave verbal consent to participate.   Assessment: TOC Outreach completed today. The patient states he is participating in HHPT twice a week. The therapist gets him to stand and does leg exercises and modified squats. The patient understands the importance of activity and he is trying to stay as active as he can due to his progressive Neuromuscular disease. He states he is managing his bowels on a bowel program and it is successful. He wears his oxygen 1L/min at night. He has not other concerns. He declines the CCM Longitudinal program.          SDOH Interventions    Flowsheet Row Patient Outreach from 04/26/2023 in [place] POPULATION HEALTH DEPARTMENT Patient Outreach from 04/12/2023 in [place] POPULATION HEALTH DEPARTMENT Office Visit from 04/11/2023 in [facility] Telephone from 04/03/2023 in [place] POPULATION HEALTH DEPARTMENT Clinical Support from 01/26/2022 in [facility] Patient Outreach Telephone from 11/09/2021 in [facility]  SDOH Interventions        Food Insecurity Interventions -- Intervention Not Indicated -- Intervention Not Indicated Intervention Not Indicated Intervention Not Indicated  Housing Interventions -- Intervention Not Indicated -- Intervention Not Indicated Intervention Not Indicated --  Transportation Interventions -- Intervention Not Indicated -- Intervention Not  Indicated Intervention Not Indicated Intervention Not Indicated  Utilities Interventions -- Intervention Not Indicated -- Intervention Not Indicated Intervention Not Indicated --  Alcohol Usage Interventions Intervention Not Indicated (Score <7) -- -- -- -- --  Depression Interventions/Treatment  -- -- Patient refuses Treatment -- -- --  Financial Strain Interventions Intervention Not Indicated -- -- -- Intervention Not Indicated --  Physical Activity Interventions Intervention Not Indicated -- -- -- -- --  Stress Interventions Intervention Not Indicated -- -- -- Intervention Not Indicated --  Social Connections Interventions -- Intervention Not Indicated -- -- Intervention Not Indicated --  Health Literacy Interventions Intervention Not Indicated -- -- -- -- --        Goals Addressed             This Visit's Progress    COMPLETED: TOC Care Plan       Current Barriers:  (reviewed 04/26/23) 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, Constipation   RNCM Clinical Goal(s): Reviewed 04/26/23 Patient will work with the Care Management team over the next 30 days to address Transition of Care Barriers: Medication access Medication Management Diet/Nutrition/Food Resources Support at home Provider appointments Home Health services Equipment/DME Functional/Safety verbalize understanding of plan for management of Myotonic Dystrophy and Constipation as evidenced by Verbal feedback, no ED or hospital Re-admission take all medications exactly as prescribed and will call provider for medication related questions as evidenced by verbal feedback  through collaboration with RN Care manager, provider, and care team.   Interventions: Reviewed 04/26/23 Evaluation of current treatment plan related to  self management and patient's adherence to plan as established by provider        (  Status:  New goal.)  Short Term Goal Evaluation of current treatment plan  related to  Myotonic Dystrophy and Constipation , Level of care concerns and ADL IADL limitations self-management and patient's adherence to plan as established by provider. Discussed plans with patient for ongoing care management follow up and provided patient with direct contact information for care management team Provided education to patient re: Soft Diet, Protein shake supplements, Constipation Reviewed medications with patient and discussed New medications and oxygen    requirement Discussed plans with patient for ongoing care management follow up and provided patient with direct contact information for care management team Screening for signs and symptoms of depression related to chronic disease state  Assessed social determinant of health barriers  Patient Goals/Self-Care Activities: Reviewed 04/26/23 Participate in Transition of Care Program/Attend [facility] scheduled calls Notify RN Care Manager of TOC call rescheduling needs Take all medications as prescribed Attend all scheduled provider appointments Call pharmacy for medication refills 3-7 days in advance of running out of medications Call provider office for new concerns or questions   Follow Up Plan:  The patient has completed the [facility] 30 Day Outreach program and declines transfer to the [facility] Longitudinal Team        The patient has been provided with contact information for the care management team and has been advised to call with any health-related questions or concerns. The patient verbalized understanding with current POC. The patient is directed to their insurance card regarding availability of benefits coverage.  Deidre Ala, BSN, RN [place]  VBCI - [facility] RN Care Manager [phone]     .c

## 2023-04-26 NOTE — Patient Instructions (Signed)
Visit Information  Thank you for taking time to visit with me today. Please don't hesitate to contact me if I can be of assistance to you before our next scheduled telephone appointment.   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, Constipation   RNCM Clinical Goal(s): Reviewed 04/26/23 Patient will work with the Care Management team over the next 30 days to address Transition of Care Barriers: Medication access Medication Management Diet/Nutrition/Food Resources Support at home Provider appointments Home Health services Equipment/DME Functional/Safety verbalize understanding of plan for management of Myotonic Dystrophy and Constipation as evidenced by Verbal feedback, no ED or hospital Re-admission take all medications exactly as prescribed and will call provider for medication related questions as evidenced by verbal feedback  through collaboration with RN Care manager, provider, and care team.   Interventions: Reviewed 04/26/23 Evaluation of current treatment plan related to  self management and patient's adherence to plan as established by provider        (Status:  New goal.)  Short Term Goal Evaluation of current treatment plan related to  Myotonic Dystrophy and Constipation , Level of care concerns and ADL IADL limitations self-management and patient's adherence to plan as established by provider. Discussed plans with patient for ongoing care management follow up and provided patient with direct contact information for care management team Provided education to patient re: Soft Diet, Protein shake supplements, Constipation Reviewed medications with patient and discussed New medications and oxygen    requirement Discussed plans with patient for ongoing care management  follow up and provided patient with direct contact information for care management team Screening for signs and symptoms of depression related to chronic disease state  Assessed social determinant of health barriers  Patient Goals/Self-Care Activities: Reviewed 04/26/23 Participate in Transition of Care Program/Attend [facility] scheduled calls Notify RN Care Manager of TOC call rescheduling needs Take all medications as prescribed Attend all scheduled provider appointments Call pharmacy for medication refills 3-7 days in advance of running out of medications Call provider office for new concerns or questions   Follow Up Plan:  The patient has completed the [facility] 30 Day Outreach program and declines transfer to the [facility]CM Longitudinal Team         Patient verbalizes understanding of instructions and care plan provided today and agrees to view in MyChart. Active MyChart status and patient understanding of how to access instructions and care plan via MyChart confirmed with patient.     No further follow up required:    Please call the care guide team at [phone] if you need to cancel or reschedule your appointment.   Please call the Suicide and Crisis Lifeline: 988 call the [place] National Suicide Prevention Lifeline: 647 047 8523 or TTY: 619-139-3236 TTY 402-331-8560) to talk to a trained counselor if you are experiencing a Mental Health or Behavioral Health Crisis or need someone to talk to.  Deidre Ala, BSN, RN [place]  VBCI - [employer] [facility] RN Care Manager [phone]

## 2023-04-27 DIAGNOSIS — J90 Pleural effusion, not elsewhere classified: Secondary | ICD-10-CM | POA: Diagnosis not present

## 2023-04-27 DIAGNOSIS — R1310 Dysphagia, unspecified: Secondary | ICD-10-CM | POA: Diagnosis not present

## 2023-04-27 DIAGNOSIS — E46 Unspecified protein-calorie malnutrition: Secondary | ICD-10-CM | POA: Diagnosis not present

## 2023-04-27 DIAGNOSIS — G7111 Myotonic muscular dystrophy: Secondary | ICD-10-CM | POA: Diagnosis not present

## 2023-04-27 DIAGNOSIS — J984 Other disorders of lung: Secondary | ICD-10-CM | POA: Diagnosis not present

## 2023-04-27 DIAGNOSIS — I447 Left bundle-branch block, unspecified: Secondary | ICD-10-CM | POA: Diagnosis not present

## 2023-04-27 DIAGNOSIS — J9621 Acute and chronic respiratory failure with hypoxia: Secondary | ICD-10-CM | POA: Diagnosis not present

## 2023-04-27 DIAGNOSIS — I429 Cardiomyopathy, unspecified: Secondary | ICD-10-CM | POA: Diagnosis not present

## 2023-04-27 DIAGNOSIS — K2970 Gastritis, unspecified, without bleeding: Secondary | ICD-10-CM | POA: Diagnosis not present

## 2023-05-08 DIAGNOSIS — K2970 Gastritis, unspecified, without bleeding: Secondary | ICD-10-CM | POA: Diagnosis not present

## 2023-05-08 DIAGNOSIS — I447 Left bundle-branch block, unspecified: Secondary | ICD-10-CM | POA: Diagnosis not present

## 2023-05-08 DIAGNOSIS — J90 Pleural effusion, not elsewhere classified: Secondary | ICD-10-CM | POA: Diagnosis not present

## 2023-05-08 DIAGNOSIS — J984 Other disorders of lung: Secondary | ICD-10-CM | POA: Diagnosis not present

## 2023-05-08 DIAGNOSIS — I429 Cardiomyopathy, unspecified: Secondary | ICD-10-CM | POA: Diagnosis not present

## 2023-05-08 DIAGNOSIS — E46 Unspecified protein-calorie malnutrition: Secondary | ICD-10-CM | POA: Diagnosis not present

## 2023-05-08 DIAGNOSIS — G7111 Myotonic muscular dystrophy: Secondary | ICD-10-CM | POA: Diagnosis not present

## 2023-05-08 DIAGNOSIS — R1310 Dysphagia, unspecified: Secondary | ICD-10-CM | POA: Diagnosis not present

## 2023-05-08 DIAGNOSIS — J9621 Acute and chronic respiratory failure with hypoxia: Secondary | ICD-10-CM | POA: Diagnosis not present

## 2023-05-10 DIAGNOSIS — E46 Unspecified protein-calorie malnutrition: Secondary | ICD-10-CM | POA: Diagnosis not present

## 2023-05-10 DIAGNOSIS — J9621 Acute and chronic respiratory failure with hypoxia: Secondary | ICD-10-CM | POA: Diagnosis not present

## 2023-05-10 DIAGNOSIS — K2970 Gastritis, unspecified, without bleeding: Secondary | ICD-10-CM | POA: Diagnosis not present

## 2023-05-10 DIAGNOSIS — G7111 Myotonic muscular dystrophy: Secondary | ICD-10-CM | POA: Diagnosis not present

## 2023-05-10 DIAGNOSIS — J90 Pleural effusion, not elsewhere classified: Secondary | ICD-10-CM | POA: Diagnosis not present

## 2023-05-10 DIAGNOSIS — J984 Other disorders of lung: Secondary | ICD-10-CM | POA: Diagnosis not present

## 2023-05-10 DIAGNOSIS — R1310 Dysphagia, unspecified: Secondary | ICD-10-CM | POA: Diagnosis not present

## 2023-05-10 DIAGNOSIS — I429 Cardiomyopathy, unspecified: Secondary | ICD-10-CM | POA: Diagnosis not present

## 2023-05-10 DIAGNOSIS — I447 Left bundle-branch block, unspecified: Secondary | ICD-10-CM | POA: Diagnosis not present

## 2023-05-12 ENCOUNTER — Telehealth: Payer: Self-pay

## 2023-05-12 NOTE — Telephone Encounter (Signed)
Received [facility]  discharge. Placed in folder for review and signature

## 2023-05-12 NOTE — Telephone Encounter (Signed)
Forms faxed

## 2023-05-12 NOTE — Telephone Encounter (Signed)
Signed. Placed in signed folder.

## 2023-05-31 ENCOUNTER — Telehealth: Payer: Self-pay

## 2023-05-31 NOTE — Telephone Encounter (Signed)
 Pt does not have a TOC appt set up with a provider yet. Please call pt to schedule a TOC as he has orders that need to be signed

## 2023-05-31 NOTE — Telephone Encounter (Signed)
 Copied from CRM [id number]. Topic: Clinical - Home Health Verbal Orders >> May 31, 2023  2:45 PM Denese Killings wrote: James [name] with [facility] is calling to see if clinic received home health order that faxed on 02/26.

## 2023-06-01 NOTE — Telephone Encounter (Signed)
 Pt needs appt. Admin notified. Will hold til toc has been scheduled

## 2023-06-06 DIAGNOSIS — F32A Depression, unspecified: Secondary | ICD-10-CM

## 2023-06-06 DIAGNOSIS — J90 Pleural effusion, not elsewhere classified: Secondary | ICD-10-CM

## 2023-06-06 DIAGNOSIS — E876 Hypokalemia: Secondary | ICD-10-CM

## 2023-06-06 DIAGNOSIS — E46 Unspecified protein-calorie malnutrition: Secondary | ICD-10-CM

## 2023-06-06 DIAGNOSIS — G7111 Myotonic muscular dystrophy: Secondary | ICD-10-CM | POA: Diagnosis not present

## 2023-06-06 DIAGNOSIS — R1310 Dysphagia, unspecified: Secondary | ICD-10-CM | POA: Diagnosis not present

## 2023-06-06 DIAGNOSIS — I429 Cardiomyopathy, unspecified: Secondary | ICD-10-CM | POA: Diagnosis not present

## 2023-06-06 DIAGNOSIS — J9621 Acute and chronic respiratory failure with hypoxia: Secondary | ICD-10-CM | POA: Diagnosis not present

## 2023-06-06 DIAGNOSIS — G4733 Obstructive sleep apnea (adult) (pediatric): Secondary | ICD-10-CM

## 2023-06-06 DIAGNOSIS — K2970 Gastritis, unspecified, without bleeding: Secondary | ICD-10-CM

## 2023-06-06 DIAGNOSIS — I447 Left bundle-branch block, unspecified: Secondary | ICD-10-CM

## 2023-06-06 DIAGNOSIS — J984 Other disorders of lung: Secondary | ICD-10-CM

## 2023-06-06 NOTE — Telephone Encounter (Signed)
 Orders have been faxed and Burna Mortimer is aware.

## 2023-06-06 NOTE — Telephone Encounter (Signed)
 Copied from CRM [id number]. Topic: Clinical - Home Health Verbal Orders >> Jun 06, 2023  1:00 PM Adele Barthel wrote: Caller/Agency: [facility] Callback Number: [phone] Service Requested: Is following up on orders placed that she was advised would be signed by Dr. Darrick Huntsman due to [facility] left the practice. This order is out of Medicare compliance and she is needing the forms faxed as soon as possible.  Frequency: n/a Any new concerns about the patient? No

## 2023-06-07 ENCOUNTER — Telehealth: Payer: Self-pay

## 2023-06-07 NOTE — Telephone Encounter (Signed)
 Error

## 2023-08-07 ENCOUNTER — Telehealth: Payer: Self-pay

## 2023-08-07 ENCOUNTER — Encounter

## 2023-08-07 NOTE — Telephone Encounter (Signed)
 Left a message to call office to reschedule Transfer of Care appointment/provider is out.   E2C2 please reschedule schedule. Dr Bernadene Brewer [facility] appointments are out to the end of January, no other earlier available appointments.

## 2023-09-26 DEATH — deceased

## 2024-04-19 ENCOUNTER — Encounter
